# Patient Record
Sex: Female | Born: 1935 | Race: White | Hispanic: No | Marital: Married | Smoking: Never smoker
Health system: Southern US, Community
[De-identification: ages and names within clinical notes are randomized; demographics above are authoritative.]

## PROBLEM LIST (undated history)

## (undated) DIAGNOSIS — R112 Nausea with vomiting, unspecified: Secondary | ICD-10-CM

## (undated) DIAGNOSIS — I2510 Atherosclerotic heart disease of native coronary artery without angina pectoris: Secondary | ICD-10-CM

## (undated) DIAGNOSIS — G2581 Restless legs syndrome: Secondary | ICD-10-CM

## (undated) DIAGNOSIS — E1140 Type 2 diabetes mellitus with diabetic neuropathy, unspecified: Secondary | ICD-10-CM

## (undated) DIAGNOSIS — E039 Hypothyroidism, unspecified: Secondary | ICD-10-CM

## (undated) DIAGNOSIS — R29898 Other symptoms and signs involving the musculoskeletal system: Secondary | ICD-10-CM

## (undated) DIAGNOSIS — F329 Major depressive disorder, single episode, unspecified: Secondary | ICD-10-CM

## (undated) DIAGNOSIS — I10 Essential (primary) hypertension: Secondary | ICD-10-CM

## (undated) DIAGNOSIS — Z9889 Other specified postprocedural states: Secondary | ICD-10-CM

## (undated) DIAGNOSIS — M1990 Unspecified osteoarthritis, unspecified site: Secondary | ICD-10-CM

## (undated) DIAGNOSIS — Z862 Personal history of diseases of the blood and blood-forming organs and certain disorders involving the immune mechanism: Secondary | ICD-10-CM

## (undated) DIAGNOSIS — F32A Depression, unspecified: Secondary | ICD-10-CM

## (undated) DIAGNOSIS — R0602 Shortness of breath: Secondary | ICD-10-CM

## (undated) DIAGNOSIS — H9190 Unspecified hearing loss, unspecified ear: Secondary | ICD-10-CM

## (undated) DIAGNOSIS — E119 Type 2 diabetes mellitus without complications: Secondary | ICD-10-CM

## (undated) DIAGNOSIS — R32 Unspecified urinary incontinence: Secondary | ICD-10-CM

## (undated) HISTORY — PX: COLONOSCOPY: SHX174

## (undated) HISTORY — DX: Shortness of breath: R06.02

## (undated) HISTORY — PX: EYE SURGERY: SHX253

## (undated) HISTORY — PX: APPENDECTOMY: SHX54

## (undated) HISTORY — DX: Atherosclerotic heart disease of native coronary artery without angina pectoris: I25.10

## (undated) HISTORY — PX: CARDIAC CATHETERIZATION: SHX172

## (undated) HISTORY — PX: ABDOMINAL HYSTERECTOMY: SHX81

## (undated) HISTORY — DX: Type 2 diabetes mellitus without complications: E11.9

## (undated) HISTORY — PX: TUBAL LIGATION: SHX77

## (undated) HISTORY — PX: CORONARY ARTERY BYPASS GRAFT: SHX141

## (undated) HISTORY — DX: Essential (primary) hypertension: I10

## (undated) HISTORY — PX: SHOULDER SURGERY: SHX246

## (undated) HISTORY — PX: BACK SURGERY: SHX140

## (undated) HISTORY — PX: CHOLECYSTECTOMY: SHX55

---

## 1997-12-03 DIAGNOSIS — I2510 Atherosclerotic heart disease of native coronary artery without angina pectoris: Secondary | ICD-10-CM

## 1997-12-03 HISTORY — DX: Atherosclerotic heart disease of native coronary artery without angina pectoris: I25.10

## 1998-04-04 ENCOUNTER — Other Ambulatory Visit: Admission: RE | Admit: 1998-04-04 | Discharge: 1998-04-04 | Payer: Self-pay | Admitting: *Deleted

## 1998-04-27 ENCOUNTER — Ambulatory Visit (HOSPITAL_COMMUNITY): Admission: RE | Admit: 1998-04-27 | Discharge: 1998-04-27 | Payer: Self-pay | Admitting: Cardiology

## 1998-05-05 ENCOUNTER — Ambulatory Visit (HOSPITAL_COMMUNITY): Admission: RE | Admit: 1998-05-05 | Discharge: 1998-05-05 | Payer: Self-pay | Admitting: Cardiology

## 1998-05-09 ENCOUNTER — Inpatient Hospital Stay (HOSPITAL_COMMUNITY): Admission: AD | Admit: 1998-05-09 | Discharge: 1998-05-16 | Payer: Self-pay | Admitting: Cardiology

## 1998-05-10 ENCOUNTER — Encounter (HOSPITAL_COMMUNITY): Admission: RE | Admit: 1998-05-10 | Discharge: 1998-08-08 | Payer: Self-pay | Admitting: *Deleted

## 1998-08-09 ENCOUNTER — Encounter (HOSPITAL_COMMUNITY): Admission: RE | Admit: 1998-08-09 | Discharge: 1998-11-07 | Payer: Self-pay | Admitting: *Deleted

## 1999-01-25 ENCOUNTER — Encounter: Admission: RE | Admit: 1999-01-25 | Discharge: 1999-04-25 | Payer: Self-pay | Admitting: *Deleted

## 1999-05-19 ENCOUNTER — Other Ambulatory Visit: Admission: RE | Admit: 1999-05-19 | Discharge: 1999-05-19 | Payer: Self-pay | Admitting: *Deleted

## 2000-05-01 ENCOUNTER — Encounter: Payer: Self-pay | Admitting: *Deleted

## 2000-05-01 ENCOUNTER — Encounter: Admission: RE | Admit: 2000-05-01 | Discharge: 2000-05-01 | Payer: Self-pay | Admitting: *Deleted

## 2000-06-10 ENCOUNTER — Other Ambulatory Visit: Admission: RE | Admit: 2000-06-10 | Discharge: 2000-06-10 | Payer: Self-pay | Admitting: *Deleted

## 2000-10-28 ENCOUNTER — Inpatient Hospital Stay (HOSPITAL_COMMUNITY): Admission: AD | Admit: 2000-10-28 | Discharge: 2000-10-30 | Payer: Self-pay | Admitting: *Deleted

## 2000-10-29 ENCOUNTER — Encounter: Payer: Self-pay | Admitting: Pediatrics

## 2001-05-02 ENCOUNTER — Encounter: Payer: Self-pay | Admitting: *Deleted

## 2001-05-02 ENCOUNTER — Encounter: Admission: RE | Admit: 2001-05-02 | Discharge: 2001-05-02 | Payer: Self-pay | Admitting: *Deleted

## 2001-05-05 ENCOUNTER — Other Ambulatory Visit: Admission: RE | Admit: 2001-05-05 | Discharge: 2001-05-05 | Payer: Self-pay | Admitting: *Deleted

## 2002-04-20 ENCOUNTER — Encounter: Admission: RE | Admit: 2002-04-20 | Discharge: 2002-04-20 | Payer: Self-pay | Admitting: *Deleted

## 2002-04-20 ENCOUNTER — Encounter: Payer: Self-pay | Admitting: *Deleted

## 2002-05-12 ENCOUNTER — Other Ambulatory Visit: Admission: RE | Admit: 2002-05-12 | Discharge: 2002-05-12 | Payer: Self-pay | Admitting: *Deleted

## 2002-05-14 ENCOUNTER — Encounter: Admission: RE | Admit: 2002-05-14 | Discharge: 2002-05-14 | Payer: Self-pay | Admitting: *Deleted

## 2002-05-14 ENCOUNTER — Encounter: Payer: Self-pay | Admitting: *Deleted

## 2002-11-25 ENCOUNTER — Encounter: Admission: RE | Admit: 2002-11-25 | Discharge: 2002-11-25 | Payer: Self-pay

## 2003-08-16 ENCOUNTER — Encounter: Payer: Self-pay | Admitting: Cardiology

## 2003-08-16 ENCOUNTER — Encounter: Admission: RE | Admit: 2003-08-16 | Discharge: 2003-08-16 | Payer: Self-pay | Admitting: Cardiology

## 2003-08-20 ENCOUNTER — Ambulatory Visit (HOSPITAL_COMMUNITY): Admission: RE | Admit: 2003-08-20 | Discharge: 2003-08-21 | Payer: Self-pay | Admitting: Cardiology

## 2003-08-20 HISTORY — PX: OTHER SURGICAL HISTORY: SHX169

## 2003-11-12 ENCOUNTER — Encounter: Admission: RE | Admit: 2003-11-12 | Discharge: 2003-11-12 | Payer: Self-pay | Admitting: Family Medicine

## 2004-08-08 ENCOUNTER — Encounter: Admission: RE | Admit: 2004-08-08 | Discharge: 2004-08-08 | Payer: Self-pay | Admitting: Family Medicine

## 2004-11-30 ENCOUNTER — Encounter: Admission: RE | Admit: 2004-11-30 | Discharge: 2004-11-30 | Payer: Self-pay | Admitting: Family Medicine

## 2005-03-02 ENCOUNTER — Encounter: Admission: RE | Admit: 2005-03-02 | Discharge: 2005-03-02 | Payer: Self-pay | Admitting: Family Medicine

## 2005-03-06 ENCOUNTER — Encounter: Admission: RE | Admit: 2005-03-06 | Discharge: 2005-03-06 | Payer: Self-pay | Admitting: Family Medicine

## 2005-12-20 ENCOUNTER — Encounter: Admission: RE | Admit: 2005-12-20 | Discharge: 2005-12-20 | Payer: Self-pay | Admitting: Family Medicine

## 2006-02-10 ENCOUNTER — Emergency Department (HOSPITAL_COMMUNITY): Admission: EM | Admit: 2006-02-10 | Discharge: 2006-02-11 | Payer: Self-pay | Admitting: Emergency Medicine

## 2006-04-05 ENCOUNTER — Ambulatory Visit (HOSPITAL_COMMUNITY): Admission: RE | Admit: 2006-04-05 | Discharge: 2006-04-05 | Payer: Self-pay | Admitting: Cardiology

## 2006-09-05 ENCOUNTER — Encounter: Admission: RE | Admit: 2006-09-05 | Discharge: 2006-09-05 | Payer: Self-pay | Admitting: Family Medicine

## 2007-01-02 ENCOUNTER — Encounter: Admission: RE | Admit: 2007-01-02 | Discharge: 2007-01-02 | Payer: Self-pay | Admitting: Family Medicine

## 2007-01-29 ENCOUNTER — Encounter: Admission: RE | Admit: 2007-01-29 | Discharge: 2007-01-29 | Payer: Self-pay | Admitting: Family Medicine

## 2007-04-29 ENCOUNTER — Encounter (INDEPENDENT_AMBULATORY_CARE_PROVIDER_SITE_OTHER): Payer: Self-pay | Admitting: Neurosurgery

## 2007-04-29 ENCOUNTER — Inpatient Hospital Stay (HOSPITAL_COMMUNITY): Admission: RE | Admit: 2007-04-29 | Discharge: 2007-04-30 | Payer: Self-pay | Admitting: Neurosurgery

## 2007-07-16 ENCOUNTER — Encounter: Admission: RE | Admit: 2007-07-16 | Discharge: 2007-07-16 | Payer: Self-pay | Admitting: Family Medicine

## 2008-01-27 ENCOUNTER — Encounter: Admission: RE | Admit: 2008-01-27 | Discharge: 2008-01-27 | Payer: Self-pay | Admitting: Family Medicine

## 2009-01-27 ENCOUNTER — Encounter: Admission: RE | Admit: 2009-01-27 | Discharge: 2009-01-27 | Payer: Self-pay | Admitting: Family Medicine

## 2010-01-30 ENCOUNTER — Encounter: Admission: RE | Admit: 2010-01-30 | Discharge: 2010-01-30 | Payer: Self-pay | Admitting: Family Medicine

## 2011-01-03 ENCOUNTER — Other Ambulatory Visit: Payer: Self-pay | Admitting: Family Medicine

## 2011-01-03 DIAGNOSIS — Z780 Asymptomatic menopausal state: Secondary | ICD-10-CM

## 2011-01-03 DIAGNOSIS — Z1239 Encounter for other screening for malignant neoplasm of breast: Secondary | ICD-10-CM

## 2011-02-01 ENCOUNTER — Ambulatory Visit
Admission: RE | Admit: 2011-02-01 | Discharge: 2011-02-01 | Disposition: A | Discharge status: Home / Self Care | Payer: MEDICARE | Source: Ambulatory Visit | Attending: Family Medicine | Admitting: Family Medicine

## 2011-02-01 DIAGNOSIS — Z1239 Encounter for other screening for malignant neoplasm of breast: Secondary | ICD-10-CM

## 2011-02-01 DIAGNOSIS — Z780 Asymptomatic menopausal state: Secondary | ICD-10-CM

## 2011-04-17 NOTE — Op Note (Signed)
Sierra Wright, Sierra Wright              ACCOUNT NO.:  [id number]   MEDICAL RECORD NO.:  [id number]          PATIENT TYPE:  INP   LOCATION:  5125                         FACILITY:  [facility]   PHYSICIAN:  Clydene Fake, M.D.  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   PROCEDURE:  Right L4-5 laminectomy and removal of epidural mass,  microdissection with microscope.   SURGEON:  Clydene Fake, M.D.   ANESTHESIA:  General endotracheal tube anesthesia.   ESTIMATED BLOOD LOSS:  Minimal.   FLUIDS GIVEN:  None.   DRAINS:  None.   COMPLICATIONS:  None.   SPECIMENS:  Sent for permanent sections.   REASON FOR PROCEDURE:  The patient is a 77 year old woman who has been  having back, right hip and right leg pain. Lyrica did not help.  MRI was  done showing multiple spondylytic changes at L4-5 with a large right-  sided epidural mass adjacent with the facet joint consistent with a  synovial cyst.  The patient was brought in for decompression.   PROCEDURE IN DETAIL:  The patient was brought in to the operating room.  General anesthesia was induced.  Patient was placed in a prone position  on the Wilson frame with all pressure points padded.  Patient was  prepped and draped in sterile fashion.  The incision was injected with  10 mL of 1% lidocaine with epinephrine.  A needle was placed in the  interspace.  X-ray was obtained and it showed we were in the L4-5  interspace.   An incision was then made centered where the needle was.  Incision was  taken down to the fascia and hemostasis was obtained with bovie  cauterization.  The fascia was incised on the right side over the  L4-5  spinous process and subperiosteal dissection was performed over the  lamina to the facet.  A self-retaining retractor was placed.  A marker  was placed in the interspace at L4-5.  Another x-ray was obtained  confirming our position.  Microscope was brought in for microdissection  at this point and a high speed drill was used to start a semi-  hemilaminectomy and medial facetectomy.  This was completed with  Kerrison punches on the top of the L5 lamina also and foraminotomy over  the L5 root.  The ligamentum flavum was very thickened and this was  removed.  As we dissected out laterally toward the facet joint, there  was a mass that was again connected with the facet joint.  The softer  tissue of this was cut and sent to pathology for evaluation.  The rest  was removed.  As we peeled the ligament off, decompressing the L4 and L5  roots.  When we were finished, we had a good decompression of the  central canal and the  L4 and L5 roots.  Upward angled curets were used  to make sure no further cyst mass was on the foramen, especially over  the L4 root, and we checked both cephalad and caudally making sure there  was no further compression of the canal.  __________  solution gave good  hemostasis and we had good decompression.  The retractor was removed.  Fascia was closed with 0 Vicryl interrupted 2-0, 3-0, subcutaneous  tissue closed with 0, 2-0 and 3-0 Vicryl sutures.  Skin closed with  benzoin and Steri-Strips.  Dressing was placed.  Patient was placed back  into supine position, awoken from anesthesia and transferred to recovery  room in stable condition.           ______________________________  Clydene Fake, M.D.     JRH/MEDQ  D:  04/29/2007  T:  04/29/2007  Job:  [id number]

## 2011-04-20 NOTE — Cardiovascular Report (Signed)
Sierra Wright, Sierra Wright                        ACCOUNT NO.:  [id number]   MEDICAL RECORD NO.:  [id number]                   PATIENT TYPE:  OIB   LOCATION:  2852                                 FACILITY:  [facility]   PHYSICIAN:  Thereasa Solo. Little, M.D.              DATE OF BIRTH:  08-27-1934   DATE OF PROCEDURE:  08/20/2003  DATE OF DISCHARGE:                              CARDIAC CATHETERIZATION   PROCEDURE PERFORMED:  Left heart catheterization with graft visualization  times three.   CARDIOLOGIST:  Thereasa Solo. Little, M.D.   INDICATIONS FOR TEST:  Sierra Wright is a 77 year old female who had bypass  surgery in 1999.  She has been relatively asymptomatic.  She had a nuclear  study performed five years out from her surgery that showed significant  anterior ischemia.  Because of this she is brought in for outpatient cardiac  catheterization.   DESCRIPTION OF PROCEDURE:  The patient was prepped and draped in the usual  sterile fashion exposing the right groin.  Following local anesthetic with  1% Xylocaine the Seldinger technique was employed and a 6 [place] introducer  sheath was placed into the left femoral artery.  Left and right coronary  arteriography, and graft visualization times three were performed.  The  right coronary catheter was used to cannulate all grafts.   EQUIPMENT:  Size six [place] Judkins.   RESULTS:   HEMODYNAMIC DATA:  Hemodynamic Monitoring:  Central aortic pressure was  173/77.  Left ventricular pressure was 172/14.  There was no significant  aortic valve gradient noted at the time of pullback.   VENTRICULOGRAPHIC DATA:  Ventriculography:  Ventriculography in the RAO  projection revealed normal left ventricular systolic function.  Ejection  fraction was greater than 55% and the end-diastolic pressure was 18.   ARTERIOGRAPHIC DATA:  Coronary Arteriography:  On fluoroscopy calcification  was seen in the distribution of the LAD, circumflex and left main  bifurcation.   ANGIOGRAPHIC DATA:  1. Left Main:  Left main normal.   1. LAD:  The LAD had an area of proximal 90% narrowing.  It was a long     eccentric area of narrowing.  Distal to that was a septal perforator and     a diagonal that had a 75% area of narrowing in it.  Just past the     diagonal the LAD was 100% occluded.   1. Circumflex:  The circumflex was 100% occluded proximally.   1. Right Coronary Artery:  The mid and proximal portions of the RCA had     several areas of 60-70% narrowing.  The distal RCA, PDA and     posterolateral vessels were free of disease, and there was bidirectional     flow into the saphenous vein graft.  There was some visualization of what     appeared to be the LAD with collaterals from the right coronary artery.    GRAFT  VISUALIZATION:  1. Saphenous Vein Graft to the Right Coronary Artery:  The graft was widely     patent.  The distal right, the PDA and posterolateral branches were free     of disease.   1. Saphenous Vein Graft to the OM:  The saphenous vein graft was widely     patent.  The OM was widely patent.   1. LIMA to the LAD:  The subclavian artery was tortuous.  Injection of this,     however, showed no obstruction just tortuosity.  The internal mammary     artery was large and widely patent.  It inserted into the midportion of     the LAD and the mid and distal LAD were free of disease.  There was     retrograde flow all the way back to the diagonal.  There was proximal LAD     disease that is no approachable with percutaneous intervention; however,     the mid and distal portions well-supplied via the internal mammary     artery.   CONCLUSIONS:  1. Widely patent bypass grafts.  2. Normal left ventricular systolic function.   The only area that is not protected appears to be the first diagonal, which  has high-grade proximal LAD disease and moderate-to-severe disease in its  midportion.  This would be a high risk/complicated  intervention.  This does  not explain the substantial anteroseptal to apical ischemia noted on the  nuclear study.  I suspect the nuclear study was false positive.   We will treat her medically.                                                 Thereasa Solo. Little, M.D.    ABL/MEDQ  D:  08/20/2003  T:  08/21/2003  Job:  [id number]   cc:   Talmadge Coventry, M.D.  [address], [address]  [place]  [place] [address]  Fax: [phone]   Catheterization Laboratory

## 2011-04-20 NOTE — Consult Note (Signed)
[place]. [facility]  Patient:    Sierra Wright, Sierra Wright                     MRN: [id number] Proc. Date: 10/28/00 Adm. Date:  [id number] Attending:  Heather Roberts CC:         Heather Roberts, M.D.   Consultation Report  DATE OF BIRTH:  30-Apr-1934.  CHIEF COMPLAINT:  Dizziness.  HISTORY OF PRESENT ILLNESS:  I was asked to see Ms. Sierra Wright, a 77 year old right-handed married woman who had sudden onset of unsteadiness and subjective vertigo while getting out of bed to void around midnight to void in the early morning of October 28, 2000.  The patient remembers falling into the wall and was not certain how that happened.  She had an episode of emesis.  She has had several loose stools and episodes of nausea and vomiting, sometimes together.  Typically, these have been associated with feeling of vertigo.  The patient denies fever, myalgias, arthralgias, coryza, abdominal or epigastric cramping or abdominal pain.  The patient has complained of some numbness in her hands and feet but she has been hyperventilating.  It has helped to manage her symptoms by lying very still and at times pushing her hand into her forehead and closing her eyes.  She has a great deal of difficulty describing the vertigo.  She says there is a fullness in her head and it feels as if there is a pounding or as if her head is enlarged.  On some occasions, however, the patient will describe the counterclockwise feeling of spinning within her head, although she does not see the world spin.  The patient was noted to have nystagmus by Dr. Orson Aloe, however, I have been unable to see any.  (See below).  REVIEW OF SYSTEMS:  See above.  Up until this time the patient had good appetite, no weight loss, normal sleep patterns, until she had the vomiting and diarrhea.  She had no symptoms of intercurrent infection in the head and neck, lungs or GU.  The patient has not had any rash.  She  has no anemia or bruisability.  No arthritis, no fractures or deformities.  Review of systems is otherwise negative.  Her current medical problems include non-insulin-dependent diabetes mellitus, hypothyroidism, hyperlipidemia hypertension, coronary artery disease.  She is postmenopausal.  PAST MEDICAL HISTORY:  Diabetes was diagnosed about two years ago.  The patient had chest tightness and coronary artery bypass graft x 3 in June 1999.  PAST SURGICAL HISTORY:  Coronary artery bypass grafting as noted above, also hysterectomy and cholecystectomy.  FAMILY HISTORY:  The patients father died in his 30s of myocardial infarction.  Her mother died in her 31s following a fractured hip and likely pulmonary embolus.  The patient had a brother die in his 23s.  There is one living sister 77, who is well.  I think another sister died of cancer.  SOCIAL HISTORY:  The patient lives with her husband and two grandchildren. She does not use tobacco or alcohol products.  CURRENT MEDICATIONS:  Synthroid 0.125 mg, Norvasc 10 mg, Micardia 80 mg, vitamin E, vitamin B6 and calcium.  ALLERGIES:  No known drug allergies.  PHYSICAL EXAMINATION:  GENERAL:  A pleasant woman, lying in bed still, in no distress.  VITAL SIGNS:  Blood pressure 190/80, resting pulse 76, respirations 20, temperature 96.9, pulse oximetry is 100% on room air.  EAR, NOSE AND THROAT:  The patient has  some popping in her temporomandibular joints, left greater than right, no pain.  NECK:  Supple, no bruits.  LUNGS:  Clear.  HEART:  No murmurs.  Pulses normal.  ABDOMEN:  Soft, bowel sounds normal.  No hepatosplenomegaly.  Nontender.  EXTREMITIES:  Well-formed without edema, cyanosis, alteration in tone or tight heel cords.  NEUROLOGIC EXAMINATION:  Awake, alert, attentive, no dysphasia or dyspraxia.  CRANIAL NERVE EXAMINATION:  Round reactive pupils, fundi normal.  Visual fields full to double simultaneous stimuli, okay  in responses and equal bilaterally.  Symmetric facial strength.  Midline tongue and uvula.  Air conduction greater than bone conduction.  No nystagmus, no tinnitus.  Negative Barany.  The patient complains of "vertigo" which is counterclockwise feeling of motion in her head when she sits up or tilts her head forward.  MOTOR EXAMINATION:  Normal strength, no drift.  Fine motor movements are normal.  SENSORY EXAM:  Sensation is intact to primary and cortical modalities.  CEREBELLAR EXAM:  Good finger-to-nose, rapid repetitive movements.  Gait and station was broad-based.  She cannot tandem.  She has a shaky Romberg.  She did well on her heels and toes when she was supported.  DEEP TENDON REFLEXES:  Diminished at the ankles and knees, normal elsewhere. She had bilateral flexor plantar responses.  IMPRESSION:  I suspect this is a central form of a vertigo.  Though it is positional, I see no evidence of nystagmus.  In addition, the patient has evidence of gait ataxia.  I am most concerned about a cerebellar outflow distribution stroke, plus the patient has multiple risk factors for stroke.  RECOMMENDATIONS: 1. Will screen for a stroke with MRI with and without contrast and MRA    intracranial.  I doubt this is a lateral medullary syndrome unless is just    involves cerebellar outflow. 2. Will decrease vertiginous symptoms with Valium 5 mg four times a day IV or    p.o. 3. Will decrease nausea with gradual increase in medications from Phenergan    12.5 mg to 25 mg every 4 hours to Zofran 4 mg every 8 hours to Droperidol 1    mg every 4 hours. 4. The patient can go home when she is more steady on her feet.  I would    recommend a physical therapist to assess her if she remains unsteady.  I appreciate the opportunity to see her.  If you have questions or if I can be of assistance, do not hesitate to contact me. DD:  10/28/00 TD:  10/29/00 Job: [id number]

## 2011-04-20 NOTE — H&P (Signed)
[place]. [facility]  Patient:    Sierra Wright, Sierra Wright                     MRN: [id number] Adm. Date:  [id number] Attending:  Heather Roberts CC:         Deanna Artis. Sharene Skeans, M.D.  Thereasa Solo. Little, M.D.   History and Physical  CHIEF COMPLAINT:  Vomiting and vertigo.  HISTORY OF PRESENT ILLNESS:  Ms. Doane is a 77 year old white married female who felt well on the day prior to admission.  She got up at midnight to void which is normal for her.  While walking to the bathroom she had the abrupt onset of swimmiheadedness with nausea.  She did go to the bathroom and return to bed, but got up at 4 a.m. and 7 a.m. to have urgent loose stools associated with vertigo and nausea with some vomiting.  She has had persistent nausea for the day whenever she moves her head.  She denies chest pain.  She is breathing quickly, but denies shortness of breath.  She has had some numbness in her hands and feet.  Denies previous episodes of dizziness.  SOCIAL HISTORY:  Lives with her husband, two grown children in the area.  MEDICATIONS: 1. Micardis 80. 2. Synthroid 0.125. 3. Norvasc 10. 4. Vitamin E. 5. 6 of calcium.  PAST MEDICAL HISTORY:  1947 appendectomy, 1972 BTL, 1984 colposcopy for abnormal Pap, 1986 D&C fractional followed by 1990 vaginal hysterectomy with A&P repair by Dr. Randell Patient in [facility].  In 1993 laparoscopic cholecystectomy by Dr. Rozetta Nunnery.  1997 sigmoidoscopy by Dr. Madilyn Fireman. November of 1998 shoulder.  June 1999 CABG x 3.  Dr. Clarene Duke is cardiologist.  FAMILY HISTORY:  Father died of MI at age 25, had hypertension, bladder cancer, PUD.  Mother had hypertension and died from complications of THR. Sisters have history of CAD, hypertension, brother takes insulin for diabetes diagnosed as adult.  REVIEW OF SYSTEMS:  HEENT: Mild congestion, not recently.  HEART: History of CAD, angina, and actually presented with profound fatigue.  She  is currently denying chest pain.  LUNGS: No wheezing and shortness of breath. GASTROINTESTINAL: She is having some loose stools and some mild epigastric discomfort.  GYN: She is menopausal, not currently symptomatic.  Has deferred HRT in the past.  PHYSICAL EXAMINATION:  GENERAL:  A white female in mild distress who is lying on the bed with her eyes closed.  VITAL SIGNS:  Temperature 98, blood pressure 138/66.  The patient is unable to stand.  HEENT:  Fundi are poorly seen.  Pharynx noninjected.  TMs clear.  Carotids 2+, no bruits, and no thyromegaly.  HEART:  Normal S1 and S2.  LUNGS:  Clear to A&P.  ABDOMEN:  Soft.  She is tender in the left upper quadrant and epigastrium.  EXTREMITIES:  Free of edema.  LABORATORY DATA:  Hemoglobin 17, glucose 179.  IMPRESSION: 1. Labyrinthitis, rule out CVA cardiac event. 2. Epigastric pain. 3. Coronary artery disease. 4. Hypertension. 5. Hyperlipidemia. 6. Hyperglycemia. 7. Hypothyroidism. 8. Menopause.  PLAN:  Admission for better evaluation, IV fluids, Phenergan, neurologic consult. DD:  10/28/00 TD:  10/28/00 Job: [id number]

## 2011-04-20 NOTE — Consult Note (Signed)
[place]. [facility]  Patient:    Sierra Wright, Sierra Wright                     MRN: [id number] Proc. Date: 10/29/00 Adm. Date:  [id number] Attending:  Heather Roberts CC:         Heather Roberts, M.D.   Consultation Report  HISTORY OF PRESENT ILLNESS:  Patient is a 77 year old female we are seeing in consultation for Dr. Heather Roberts because of chest pain.  She was admitted on October 28, 2000 with dizziness rule out CVA.  While having an MRI done today, she developed pressure in her chest, this resolved immediately after the MRI, and she is now pain-free.  Her past history includes bypass surgery in 1999 including internal mammary artery to the LAD, saphenous vein graft to the RCA, saphenous vein graft to the circumflex.  She was last seen September 2001 in my office and was doing quite well and exercising regularly without any limitations.  Up until yesterday when the dizziness began she had been very active with no limitations.  She walks on a treadmill at 2.8 mph and she rides a stationary bicycle without any exertional induced pain or pressure or unusual shortness of breath.  Her cardiac enzymes revealed a CK of 158 with 1.3 MB.  A second CK was 125 but the MB fraction was increased at 4.1 (I doubt any clinical significance).  She was mildly hypokalemic with a 2.9 potassium at the time of admission, this is being replaced, she had been on hydrochlorothiazide.  Her EKG shows a chronic right bundle branch block however the EKG today and the EKG at admission had T wave inversion in 3 and F which was more than the cardiogram September 2001 in my office.  PAST MEDICAL HISTORY:  History of hyperlipidemia, hypothyroidism, diabetes, hypertension, and coronary artery disease.  OUTPATIENT MEDICATIONS:  1. Norvasc 10.  2. ______ 80 q.d.  3. Hydrochlorothiazide 25 mg a day.  4. KCl 20 mEq a day.  5. Calcium.  6. Multivitamins.  7. Vitamin E.  8.  Synthroid 0.125.  9. Amaryl 2 mg a day. 10. Valium 5 mg PRE p.r.n.  ALLERGIES:  No known drug allergies.  FAMILY HISTORY:  Positive for coronary artery disease and hypertension.  SOCIAL HISTORY:  She is married with two adult children.  No alcohol.  No cigarettes.  PHYSICAL EXAMINATION:  VITAL SIGNS:  Blood pressure is 148/84.  Her pulse is in the 70s and is regular.  She is afebrile.  LUNGS:  Clear.  CARDIAC:  Regular rate and rhythm, 1/6 faint systolic murmur, abnormal second heart sound secondary to bundle branch block, no chest wall or epigastric tenderness.  She has 2+ pulses in her lower extremities, no edema.  She has no carotid, no JVD, thyroid was nonpalpable.  SKIN:  Warm and dry.  ASSESSMENT: 1. Atypical chest pain - doubt this is angina.  We will check troponin and    repeat EKG and CK and troponin in the morning.  For now, we will not change    her current medical therapy. 2. Dizziness - probably vertigo with neurology evaluation underway. 3. Hypertension - under good control. 4. Diabetes mellitus - per Dr. Orson Aloe. DD:  10/29/00 TD:  10/29/00 Job: [id number]

## 2011-04-20 NOTE — Discharge Summary (Signed)
[place]. [facility]  Patient:    TUCKER, STEEDLEY                     MRN: [id number] Adm. Date:  [id number] Disc. Date: 10/30/00 Attending:  Heather Roberts CC:         Thereasa Solo. Little, M.D.  Deanna Artis. Sharene Skeans, M.D.   Discharge Summary  CONSULTATIONS:  Deanna Artis. Sharene Skeans, M.D. and Thereasa Solo. Little, M.D.  DISCHARGE DIAGNOSES: 1. Labyrinthitis. 2. Atypical chest pain. 3. Known coronary artery disease, status post coronary artery bypass graft. 4. Hypertension. 5. Hypothyroidism. 6. Hyperlipidemia. 7. Hypokalemia.  HISTORY OF PRESENT ILLNESS:  Ms. Wiederhold is a 77 year old white married female, who was admitted after 12 hours of dizziness associated with vomiting with all movement of head.  At the time of admission, it was unclear whether this was a central or peripheral problem.  LABORATORY DATA:  CBC normal.  Chemistries remarkable for a potassium of 2.9 on the day after admission.  CPK with MB and troponins negative.  MRI and MRA done on the morning following admission showed mild changes of small vessel disease within the deep and subcortical white matter of the cerebral hemispheres, particularly the parietal lobes.  Acute reversible process, none noted.  Angiogram showed medium vessel atherosclerotic change within the intracranial vessels more pronounced in the posterior cerebral arteries and in the anterior and middle cerebral arteries.  ECG showed normal sinus rhythm with a right bundle-branch block unchanged.  HOSPITAL COURSE:  Ms. Scrima was admitted with the above symptoms and was seen on evening of admission by Dr. Sharene Skeans.  It was unclear by her examination whether she had a central peripheral problem and MRI was ordered. On the morning following admission she felt somewhat better but still had nausea and dizziness with movement of her head.  She progressed through that day to being able to tolerate a regular diet.  After  returning for her MRI the morning following admission, she developed some substernal chest discomfort and Dr. Clarene Duke was asked to see her in consultation.  ECGs and cardiac enzymes were obtained and the feeling was that she had atypical chest pain and would follow up with Dr. Clarene Duke as an outpatient for further evaluation if needed.  DISCHARGE CONDITION:  Satisfactory.  DISCHARGE MEDICATIONS: 1. Antivert 25 t.i.d. p.r.n. dizziness. 2. Phenergan 25 p.r. or p.o. q.4-6h. p.r.n. nausea, vomiting. 3. Norvasc 10. 4. Micardis 80. 5. Slow-K b.i.d. 6. HCTZ 25. 7. Synthroid 0.125. 8. Diazepam 10 p.r.n. which is usually three times a month. DD:  10/30/00 TD:  10/30/00 Job: [id number]

## 2011-12-27 ENCOUNTER — Other Ambulatory Visit: Payer: Self-pay | Admitting: Family Medicine

## 2011-12-27 DIAGNOSIS — Z1231 Encounter for screening mammogram for malignant neoplasm of breast: Secondary | ICD-10-CM

## 2012-02-04 ENCOUNTER — Ambulatory Visit
Admission: RE | Admit: 2012-02-04 | Discharge: 2012-02-04 | Disposition: A | Discharge status: Home / Self Care | Payer: Self-pay | Source: Ambulatory Visit | Attending: Family Medicine | Admitting: Family Medicine

## 2012-02-04 DIAGNOSIS — Z1231 Encounter for screening mammogram for malignant neoplasm of breast: Secondary | ICD-10-CM

## 2012-09-03 DIAGNOSIS — R0602 Shortness of breath: Secondary | ICD-10-CM

## 2012-09-03 HISTORY — DX: Shortness of breath: R06.02

## 2013-01-08 ENCOUNTER — Other Ambulatory Visit: Payer: Self-pay | Admitting: Family Medicine

## 2013-01-08 DIAGNOSIS — Z1231 Encounter for screening mammogram for malignant neoplasm of breast: Secondary | ICD-10-CM

## 2013-02-04 ENCOUNTER — Ambulatory Visit: Payer: Medicare Other

## 2013-03-04 ENCOUNTER — Ambulatory Visit
Admission: RE | Admit: 2013-03-04 | Discharge: 2013-03-04 | Disposition: A | Discharge status: Home / Self Care | Payer: Medicare Other | Source: Ambulatory Visit | Attending: Family Medicine | Admitting: Family Medicine

## 2013-03-04 DIAGNOSIS — Z1231 Encounter for screening mammogram for malignant neoplasm of breast: Secondary | ICD-10-CM

## 2013-03-06 ENCOUNTER — Encounter: Payer: Self-pay | Admitting: Physician Assistant

## 2013-03-07 ENCOUNTER — Encounter: Payer: Self-pay | Admitting: *Deleted

## 2013-03-11 ENCOUNTER — Encounter: Payer: Self-pay | Admitting: Internal Medicine

## 2013-04-06 ENCOUNTER — Other Ambulatory Visit (HOSPITAL_COMMUNITY): Payer: Self-pay | Admitting: Internal Medicine

## 2013-04-06 DIAGNOSIS — M25569 Pain in unspecified knee: Secondary | ICD-10-CM

## 2013-04-06 DIAGNOSIS — R600 Localized edema: Secondary | ICD-10-CM

## 2013-04-17 ENCOUNTER — Ambulatory Visit (HOSPITAL_COMMUNITY)
Admission: RE | Admit: 2013-04-17 | Discharge: 2013-04-17 | Disposition: A | Discharge status: Home / Self Care | Payer: Medicare Other | Source: Ambulatory Visit | Attending: Cardiovascular Disease | Admitting: Cardiovascular Disease

## 2013-04-17 DIAGNOSIS — R609 Edema, unspecified: Secondary | ICD-10-CM | POA: Insufficient documentation

## 2013-04-17 DIAGNOSIS — M79609 Pain in unspecified limb: Secondary | ICD-10-CM

## 2013-04-17 DIAGNOSIS — M7989 Other specified soft tissue disorders: Secondary | ICD-10-CM

## 2013-04-17 DIAGNOSIS — R600 Localized edema: Secondary | ICD-10-CM

## 2013-04-17 DIAGNOSIS — M25569 Pain in unspecified knee: Secondary | ICD-10-CM | POA: Insufficient documentation

## 2013-04-17 NOTE — Progress Notes (Signed)
Venous Duplex Lower Ext. Completed. Telicia Hodgkiss D  

## 2013-04-17 NOTE — Progress Notes (Signed)
Lower extremity arterial complete. GMG 

## 2013-04-20 ENCOUNTER — Other Ambulatory Visit: Payer: Self-pay | Admitting: *Deleted

## 2013-04-20 MED ORDER — BENAZEPRIL HCL 40 MG PO TABS: 40.0000 mg | ORAL_TABLET | Freq: Every day | ORAL | Status: DC

## 2013-04-23 ENCOUNTER — Other Ambulatory Visit: Payer: Self-pay

## 2013-04-23 MED ORDER — POTASSIUM CHLORIDE CRYS ER 10 MEQ PO TBCR: 10.0000 meq | EXTENDED_RELEASE_TABLET | Freq: Every day | ORAL | Status: DC

## 2013-04-23 NOTE — Telephone Encounter (Signed)
Rx was sent to pharmacy electronically. 

## 2013-04-26 ENCOUNTER — Other Ambulatory Visit (HOSPITAL_COMMUNITY): Payer: Self-pay | Admitting: Internal Medicine

## 2013-05-19 ENCOUNTER — Other Ambulatory Visit (HOSPITAL_COMMUNITY): Payer: Self-pay | Admitting: Internal Medicine

## 2013-08-07 ENCOUNTER — Encounter (INDEPENDENT_AMBULATORY_CARE_PROVIDER_SITE_OTHER): Payer: Self-pay | Admitting: Ophthalmology

## 2013-08-27 ENCOUNTER — Other Ambulatory Visit: Payer: Self-pay | Admitting: Neurosurgery

## 2013-08-27 DIAGNOSIS — M545 Low back pain: Secondary | ICD-10-CM

## 2013-09-10 ENCOUNTER — Ambulatory Visit
Admission: RE | Admit: 2013-09-10 | Discharge: 2013-09-10 | Disposition: A | Discharge status: Home / Self Care | Payer: Medicare Other | Source: Ambulatory Visit | Attending: Neurosurgery | Admitting: Neurosurgery

## 2013-09-10 DIAGNOSIS — M545 Low back pain: Secondary | ICD-10-CM

## 2013-09-10 MED ORDER — GADOBENATE DIMEGLUMINE 529 MG/ML IV SOLN
20.0000 mL | Freq: Once | INTRAVENOUS | Status: AC | PRN
  Administered 2013-09-10: 20 mL via INTRAVENOUS

## 2013-09-17 ENCOUNTER — Other Ambulatory Visit (HOSPITAL_COMMUNITY): Payer: Self-pay | Admitting: Internal Medicine

## 2013-09-17 NOTE — Telephone Encounter (Signed)
Rx was sent to pharmacy electronically. 

## 2013-09-28 ENCOUNTER — Other Ambulatory Visit: Payer: Self-pay | Admitting: *Deleted

## 2013-09-28 MED ORDER — AMLODIPINE BESYLATE 10 MG PO TABS: 10.0000 mg | ORAL_TABLET | Freq: Every day | ORAL | Status: DC

## 2013-09-28 NOTE — Telephone Encounter (Signed)
Rx was sent to pharmacy electronically. 

## 2013-10-01 ENCOUNTER — Other Ambulatory Visit: Payer: Self-pay | Admitting: Neurosurgery

## 2013-10-01 DIAGNOSIS — G959 Disease of spinal cord, unspecified: Secondary | ICD-10-CM

## 2013-10-13 ENCOUNTER — Other Ambulatory Visit: Payer: Medicare Other

## 2013-10-14 ENCOUNTER — Ambulatory Visit
Admission: RE | Admit: 2013-10-14 | Discharge: 2013-10-14 | Disposition: A | Discharge status: Home / Self Care | Payer: Medicare Other | Source: Ambulatory Visit | Attending: Neurosurgery | Admitting: Neurosurgery

## 2013-10-14 ENCOUNTER — Other Ambulatory Visit: Payer: Self-pay | Admitting: Neurosurgery

## 2013-10-14 DIAGNOSIS — G959 Disease of spinal cord, unspecified: Secondary | ICD-10-CM

## 2013-11-15 ENCOUNTER — Other Ambulatory Visit: Payer: Self-pay | Admitting: Internal Medicine

## 2013-11-16 NOTE — Telephone Encounter (Signed)
Rx was sent to pharmacy electronically. 

## 2013-11-20 ENCOUNTER — Other Ambulatory Visit: Payer: Self-pay | Admitting: *Deleted

## 2013-11-20 MED ORDER — METOPROLOL TARTRATE 50 MG PO TABS: ORAL_TABLET | ORAL | Status: DC

## 2013-11-20 NOTE — Telephone Encounter (Signed)
Rx was sent to pharmacy electronically. 

## 2013-12-23 ENCOUNTER — Other Ambulatory Visit: Payer: Self-pay | Admitting: *Deleted

## 2013-12-23 MED ORDER — HYDRALAZINE HCL 25 MG PO TABS: 25.0000 mg | ORAL_TABLET | Freq: Two times a day (BID) | ORAL | Status: DC

## 2014-02-03 ENCOUNTER — Other Ambulatory Visit: Payer: Self-pay

## 2014-02-03 DIAGNOSIS — Z1231 Encounter for screening mammogram for malignant neoplasm of breast: Secondary | ICD-10-CM

## 2014-03-05 ENCOUNTER — Ambulatory Visit
Admission: RE | Admit: 2014-03-05 | Discharge: 2014-03-05 | Disposition: A | Discharge status: Home / Self Care | Payer: Medicare Other | Source: Ambulatory Visit

## 2014-03-05 DIAGNOSIS — Z1231 Encounter for screening mammogram for malignant neoplasm of breast: Secondary | ICD-10-CM

## 2014-03-15 ENCOUNTER — Encounter (INDEPENDENT_AMBULATORY_CARE_PROVIDER_SITE_OTHER): Payer: Medicare Other | Admitting: Ophthalmology

## 2014-03-15 ENCOUNTER — Encounter: Payer: Self-pay | Admitting: Cardiology

## 2014-03-15 ENCOUNTER — Ambulatory Visit (INDEPENDENT_AMBULATORY_CARE_PROVIDER_SITE_OTHER): Payer: Medicare Other | Admitting: Cardiology

## 2014-03-15 VITALS — BP 154/72 | HR 52 | Ht 65.0 in | Wt 212.0 lb

## 2014-03-15 DIAGNOSIS — R609 Edema, unspecified: Secondary | ICD-10-CM | POA: Insufficient documentation

## 2014-03-15 DIAGNOSIS — I440 Atrioventricular block, first degree: Secondary | ICD-10-CM | POA: Insufficient documentation

## 2014-03-15 DIAGNOSIS — E1139 Type 2 diabetes mellitus with other diabetic ophthalmic complication: Secondary | ICD-10-CM

## 2014-03-15 DIAGNOSIS — R001 Bradycardia, unspecified: Secondary | ICD-10-CM | POA: Insufficient documentation

## 2014-03-15 DIAGNOSIS — Z951 Presence of aortocoronary bypass graft: Secondary | ICD-10-CM

## 2014-03-15 DIAGNOSIS — E119 Type 2 diabetes mellitus without complications: Secondary | ICD-10-CM | POA: Insufficient documentation

## 2014-03-15 DIAGNOSIS — H35039 Hypertensive retinopathy, unspecified eye: Secondary | ICD-10-CM

## 2014-03-15 DIAGNOSIS — H43819 Vitreous degeneration, unspecified eye: Secondary | ICD-10-CM

## 2014-03-15 DIAGNOSIS — E11319 Type 2 diabetes mellitus with unspecified diabetic retinopathy without macular edema: Secondary | ICD-10-CM

## 2014-03-15 DIAGNOSIS — I498 Other specified cardiac arrhythmias: Secondary | ICD-10-CM

## 2014-03-15 DIAGNOSIS — E1165 Type 2 diabetes mellitus with hyperglycemia: Secondary | ICD-10-CM

## 2014-03-15 DIAGNOSIS — I10 Essential (primary) hypertension: Secondary | ICD-10-CM

## 2014-03-15 DIAGNOSIS — I4510 Unspecified right bundle-branch block: Secondary | ICD-10-CM

## 2014-03-15 DIAGNOSIS — M1990 Unspecified osteoarthritis, unspecified site: Secondary | ICD-10-CM

## 2014-03-15 DIAGNOSIS — I119 Hypertensive heart disease without heart failure: Secondary | ICD-10-CM

## 2014-03-15 DIAGNOSIS — IMO0001 Reserved for inherently not codable concepts without codable children: Secondary | ICD-10-CM

## 2014-03-15 DIAGNOSIS — R42 Dizziness and giddiness: Secondary | ICD-10-CM

## 2014-03-15 MED ORDER — AMLODIPINE BESYLATE 10 MG PO TABS: 10.0000 mg | ORAL_TABLET | Freq: Every day | ORAL | Status: DC

## 2014-03-15 MED ORDER — HYDRALAZINE HCL 25 MG PO TABS: 25.0000 mg | ORAL_TABLET | Freq: Three times a day (TID) | ORAL | Status: DC

## 2014-03-15 MED ORDER — METOPROLOL TARTRATE 25 MG PO TABS: ORAL_TABLET | ORAL | Status: DC

## 2014-03-15 MED ORDER — FUROSEMIDE 40 MG PO TABS: ORAL_TABLET | ORAL | Status: DC

## 2014-03-15 MED ORDER — BENAZEPRIL HCL 40 MG PO TABS: ORAL_TABLET | ORAL | Status: DC

## 2014-03-15 MED ORDER — POTASSIUM CHLORIDE CRYS ER 10 MEQ PO TBCR
10.0000 meq | EXTENDED_RELEASE_TABLET | Freq: Every day | ORAL | Status: DC
Start: 2014-03-15 — End: 2014-07-26

## 2014-03-15 NOTE — Patient Instructions (Signed)
START ASPIRIN 81 MG DAILY  DECREASE YOUR MOBIC TO 7.5 MG ONCE A DAY  INCREASE YOUR HYDRALAZINE TO 25 MG THREE TIMES A DAY  DECREASE YOUR METOPROLOL TO 25 MG TWICE A DAY  Your physician has requested that you have an echocardiogram. Echocardiography is a painless test that uses sound waves to create images of your heart. It provides your doctor with information about the size and shape of your heart and how well your heart's chambers and valves are working. This procedure takes approximately one hour. There are no restrictions for this procedure.  Your physician wants you to follow-up in: 4 MONTH OV/EKG  You will receive a reminder letter in the mail two months in advance. If you don't receive a letter, please call our office to schedule the follow-up appointment.

## 2014-03-15 NOTE — Progress Notes (Signed)
Sierra MajorSarah L Wright Date of Birth:  12/25/1933 [address] [place], [place]  [id number] [phone]         Fax   [phone]  History of Present Illness: This pleasant 80 year old woman is seen by me for the first time today at the request of her PCP Dr. Mila PalmerSharon Wolters.  The patient has a history of known ischemic heart disease.  She had successful CABG 16 years ago by Dr. Tyrone SageGerhardt.  She has not been experiencing any subsequent chest pain or angina.  She notes occasional shortness of breath.  She had an echocardiogram in October 2013 which showed normal left ventricular systolic function with ejection fraction greater than 55% and she did have diastolic dysfunction.  She has a history of hypertension and she has a past history of mild fluid retention responding to Lasix.  She has intermittent ankle edema.  Her overall weight has been stable.  She has occasional mild dizzy spells but no syncope.  She has not been experiencing any palpitations or tachycardia.  She is relatively sedentary because of significant osteoarthritis of her knees.  She arrived in our office today by wheelchair.  She sleeps with one pillow under her head and also a pillow under her feet to help with her mild dependent edema.  During the day she wears compression hose.  She does not have any history of TIA or stroke.  She does not have any history of peptic ulcer disease or GI bleeding.  She has a history of intolerance to cholesterol lowering drugs including statins, ezetimibe, and Welchol.  Family history is positive for ischemic heart disease.  Her father died at 8064 and a brother died at 7555 of heart attacks Current Outpatient Prescriptions  Medication Sig Dispense Refill  . amLODipine (NORVASC) 10 MG tablet Take 1 tablet (10 mg total) by mouth daily.  30 tablet  5  . Ascorbic Acid (VITAMIN C ER PO) Take by mouth daily.      Marland Kitchen. aspirin 81 MG tablet Take 81 mg by mouth daily.      . benazepril (LOTENSIN) 40 MG  tablet TAKE ONE TABLET BY MOUTH ONCE DAILY  30 tablet  5  . Calcium Carb-Cholecalciferol (CALCIUM 1000 + D PO) Take by mouth daily.      . diazepam (VALIUM) 10 MG tablet Take 10 mg by mouth every 6 (six) hours as needed for anxiety.      . fish oil-omega-3 fatty acids 1000 MG capsule Take 1 g by mouth daily.      . furosemide (LASIX) 40 MG tablet TAKE ONE TABLET BY MOUTH TWICE DAILY  60 tablet  5  . Garlic 1000 MG CAPS Take by mouth daily.      Marland Kitchen. glimepiride (AMARYL) 4 MG tablet Take 4 mg by mouth 2 (two) times daily.      . hydrALAZINE (APRESOLINE) 25 MG tablet Take 1 tablet (25 mg total) by mouth 3 (three) times daily.  90 tablet  5  . levothyroxine (SYNTHROID, LEVOTHROID) 100 MCG tablet Take 100 mcg by mouth daily before breakfast.      . meloxicam (MOBIC) 7.5 MG tablet Take 7.5 mg by mouth daily.      . metoprolol (LOPRESSOR) 25 MG tablet TAKE ONE TABLET BY MOUTH TWICE DAILY  60 tablet  5  . potassium chloride (K-DUR,KLOR-CON) 10 MEQ tablet Take 1 tablet (10 mEq total) by mouth daily.  30 tablet  5  . rOPINIRole (REQUIP) 0.5  MG tablet Take 0.5 mg by mouth daily.      . vitamin B-12 (CYANOCOBALAMIN) 1000 MCG tablet Take 1,000 mcg by mouth daily.       No current facility-administered medications for this visit.    Allergies  Allergen Reactions  . Statins Other (See Comments)    Causes myalgias  . Welchol [Colesevelam Hcl] Other (See Comments)    Causes dizziness    Patient Active Problem List   Diagnosis Date Noted  . Hx of CABG 03/15/2014  . Osteoarthritis 03/15/2014  . Type II or unspecified type diabetes mellitus without mention of complication, uncontrolled 03/15/2014  . Peripheral edema 03/15/2014  . Benign hypertensive heart disease without heart failure 03/15/2014  . Right bundle branch block 03/15/2014  . First degree AV block 03/15/2014  . Sinus bradycardia by electrocardiogram 03/15/2014  . Dizziness 03/15/2014    History  Smoking status  . Never Smoker     Smokeless tobacco  . Not on file    History  Alcohol Use: Not on file    Family history: Father died of a heart attack at age 56. Mother died of pneumonia following hip fracture at age 61. She has a brother who died of a heart attack at age 45.  Review of Systems: Constitutional: no fever chills diaphoresis or fatigue or change in weight.  Head and neck: no hearing loss, no epistaxis, no photophobia or visual disturbance. Respiratory: Positive for mild dyspnea intermittently Cardiovascular: No chest pain  palpitations.  Positive for intermittent pedal edema Gastrointestinal: No abdominal distention, no abdominal pain, no change in bowel habits hematochezia or melena. Genitourinary: No dysuria, no frequency, no urgency, no nocturia. Musculoskeletal:No arthralgias, no back pain, no gait disturbance or myalgias.  Positive for incapacitating osteoarthritis of her knees Neurological: No dizziness, no headaches, no numbness, no seizures, no syncope, no weakness, no tremors. Hematologic: No lymphadenopathy, no easy bruising. Psychiatric: No confusion, no hallucinations, no sleep disturbance.    Physical Exam: Filed Vitals:   03/15/14 1048  BP: 154/72  Pulse: 52   the general appearance reveals a well-developed elderly woman in no acute distress.  She arrives by wheelchair.  Her husband came with her.The head and neck exam reveals pupils equal and reactive.  Extraocular movements are full.  There is no scleral icterus.  The mouth and pharynx are normal.  The neck is supple.  The carotids reveal no bruits.  The jugular venous pressure is normal.  The  thyroid is not enlarged.  There is no lymphadenopathy.  The chest is clear to percussion and auscultation.  There are no rales or rhonchi.  Expansion of the chest is symmetrical.  The precordium is quiet.  The first heart sound is normal.  The second heart sound is physiologically split.  There is no murmur gallop rub or click.  There is no  abnormal lift or heave.  The abdomen is soft and nontender.  The bowel sounds are normal.  The liver and spleen are not enlarged.  There are no abdominal masses.  There are no abdominal bruits.  Extremities reveal good pedal pulses.  There is no phlebitis.  There is trace ankle edema.  There is no cyanosis or clubbing.  Strength is normal and symmetrical in all extremities.  There is no lateralizing weakness.  There are no sensory deficits.  The skin is warm and dry.  There is no rash.   EKG today shows sinus bradycardia at 52 per minute with first degree AV block  and a PR interval of 326 ms.  There is a pattern of a right bundle branch block which is old.  There are nonspecific ST-T wave changes present.  Assessment / Plan: 1. ischemic heart disease status post CABG 16 years ago.  No recurrent angina. 2. chronic diastolic CHF, compensated. 3. hypertensive cardiovascular disease 4. diabetes mellitus,type II 5. right bundle branch block, old 6. sinus bradycardia and first degree AV block 7. sedentary lifestyle secondary to osteoarthritis of knees.  She has seen orthopedic surgery but no discussion of surgical intervention was made. 8. hypothyroidism  Recommendation: We will update her echocardiogram. Because of her marked sinus bradycardia and first-degree AV block we will reduce her beta blocker to just 25 mg metoprolol tartrate twice a day For additional blood pressure control we will increase her hydralazine to 25 mg 3 times a day I have recommended that she resume taking a baby aspirin daily because of her known ischemic heart disease.  I have also recommended that she reduce her Mobic to just 7.5 mg once a day because of the adverse effect that NSAIDS can have on cardiovascular disease. She will continue her heart healthy diabetic diet. I asked her to return in about 4 months for followup office visit and EKG, or sooner when necessary. We refilled her cardiovascular medications today. Many  thanks for the opportunity to see this pleasant woman with you.

## 2014-03-24 ENCOUNTER — Ambulatory Visit (HOSPITAL_COMMUNITY): Payer: Medicare Other | Attending: Cardiology | Admitting: Radiology

## 2014-03-24 DIAGNOSIS — R42 Dizziness and giddiness: Secondary | ICD-10-CM | POA: Insufficient documentation

## 2014-03-24 DIAGNOSIS — I440 Atrioventricular block, first degree: Secondary | ICD-10-CM | POA: Insufficient documentation

## 2014-03-24 DIAGNOSIS — I4510 Unspecified right bundle-branch block: Secondary | ICD-10-CM

## 2014-03-24 NOTE — Progress Notes (Signed)
Echocardiogram performed.  

## 2014-03-26 ENCOUNTER — Telehealth: Payer: Self-pay | Admitting: *Deleted

## 2014-03-26 NOTE — Telephone Encounter (Signed)
Message copied by Burnell BlanksPRATT, Malayah Demuro B on Fri Mar 26, 2014  5:13 PM ------      Message from: Cassell ClementBRACKBILL, THOMAS      Created: Thu Mar 25, 2014  9:00 PM       Please report.  The echo is stable. LV systolic function remains good. There is grade I diastolic dysfunction.      Continue current meds. ------

## 2014-03-26 NOTE — Telephone Encounter (Signed)
Advised patient

## 2014-05-24 ENCOUNTER — Other Ambulatory Visit: Payer: Self-pay | Admitting: Family Medicine

## 2014-05-24 DIAGNOSIS — R531 Weakness: Secondary | ICD-10-CM

## 2014-05-24 DIAGNOSIS — R42 Dizziness and giddiness: Secondary | ICD-10-CM

## 2014-05-27 ENCOUNTER — Other Ambulatory Visit: Payer: Medicare Other

## 2014-05-27 ENCOUNTER — Ambulatory Visit
Admission: RE | Admit: 2014-05-27 | Discharge: 2014-05-27 | Disposition: A | Discharge status: Home / Self Care | Payer: Medicare Other | Source: Ambulatory Visit | Attending: Family Medicine | Admitting: Family Medicine

## 2014-05-27 DIAGNOSIS — R531 Weakness: Secondary | ICD-10-CM

## 2014-05-27 DIAGNOSIS — R42 Dizziness and giddiness: Secondary | ICD-10-CM

## 2014-07-26 ENCOUNTER — Encounter: Payer: Self-pay | Admitting: Cardiology

## 2014-07-26 ENCOUNTER — Ambulatory Visit (INDEPENDENT_AMBULATORY_CARE_PROVIDER_SITE_OTHER): Payer: Medicare Other | Admitting: Cardiology

## 2014-07-26 VITALS — BP 136/64 | HR 66 | Ht 65.0 in | Wt 210.0 lb

## 2014-07-26 DIAGNOSIS — E1165 Type 2 diabetes mellitus with hyperglycemia: Secondary | ICD-10-CM

## 2014-07-26 DIAGNOSIS — Z951 Presence of aortocoronary bypass graft: Secondary | ICD-10-CM

## 2014-07-26 DIAGNOSIS — I119 Hypertensive heart disease without heart failure: Secondary | ICD-10-CM

## 2014-07-26 DIAGNOSIS — IMO0001 Reserved for inherently not codable concepts without codable children: Secondary | ICD-10-CM

## 2014-07-26 NOTE — Assessment & Plan Note (Signed)
She states that her blood sugars have improved significantly since Dr. Paulino Rily put her on metformin.  She is not having any hypoglycemic episodes.

## 2014-07-26 NOTE — Progress Notes (Signed)
Sierra Wright Date of Birth:  07-22-1934 [facility] [address] [place], [place]  [address] [phone]        Fax   [phone]   History of Present Illness: This pleasant 80 year old woman is seen for a followup office visit.  Her PCP is Dr. [name]. The patient has a history of known ischemic heart disease. She had successful CABG 16 years ago by Dr. [name]. She has not been experiencing any subsequent chest pain or angina. She notes occasional shortness of breath. She had an echocardiogram in October 2013 which showed normal left ventricular systolic function with ejection fraction greater than 55% and she did have diastolic dysfunction. She has a history of hypertension and she has a past history of mild fluid retention responding to Lasix. She has intermittent ankle edema. Her overall weight has been stable. She has occasional mild dizzy spells but no syncope. She has not been experiencing any palpitations or tachycardia. She is relatively sedentary because of significant osteoarthritis of her knees. She arrived in our office today by wheelchair. She sleeps with one pillow under her head and also a pillow under her feet to help with her mild dependent edema. During the day she wears compression hose. She does not have any history of TIA or stroke. She does not have any history of peptic ulcer disease or GI bleeding. She has a history of intolerance to cholesterol lowering drugs including statins, ezetimibe, and Welchol. Family history is positive for ischemic heart disease. Her father died at 57 and a brother died at 1 of heart attacks. The patient had an echocardiogram on 03/24/14 which demonstrated:- Left ventricle: The cavity size was normal. Wall thickness was increased in a pattern of mild LVH. There was focal basal hypertrophy. Systolic function was vigorous. The estimated ejection fraction was in the range of 65% to 70%. There was an increased  relative contribution of atrial contraction to ventricular filling. - Left atrium: The atrium was moderately dilated. - Right ventricle: The cavity size was mildly dilated. At her last visit we decreased her beta blocker because of bradycardia and we increased her hydralazine because of systolic hypertension.   Current Outpatient Prescriptions  Medication Sig Dispense Refill  . amLODipine (NORVASC) 10 MG tablet Take 1 tablet (10 mg total) by mouth daily.  30 tablet  5  . Ascorbic Acid (VITAMIN C ER PO) Take by mouth daily.      Marland Kitchen aspirin 81 MG tablet Take 81 mg by mouth daily.      . benazepril (LOTENSIN) 40 MG tablet TAKE ONE TABLET BY MOUTH ONCE DAILY  30 tablet  5  . Calcium Carb-Cholecalciferol (CALCIUM 1000 + D PO) Take by mouth daily.      . diazepam (VALIUM) 10 MG tablet Take 10 mg by mouth every 6 (six) hours as needed for anxiety.      . fish oil-omega-3 fatty acids 1000 MG capsule Take 1 g by mouth daily.      . furosemide (LASIX) 40 MG tablet Take 40 mg by mouth daily.      . Garlic 1000 MG CAPS Take by mouth daily.      Marland Kitchen glimepiride (AMARYL) 4 MG tablet Take 4 mg by mouth 2 (two) times daily.      . hydrALAZINE (APRESOLINE) 25 MG tablet Take 1 tablet (25 mg total) by mouth 3 (three) times daily.  90 tablet  5  . levothyroxine (SYNTHROID, LEVOTHROID) 112 MCG  tablet Take 112 mcg by mouth daily before breakfast.      . meloxicam (MOBIC) 7.5 MG tablet Take 7.5 mg by mouth daily.      . metFORMIN (GLUCOPHAGE) 500 MG tablet Take by mouth 2 (two) times daily with a meal.      . metoprolol (LOPRESSOR) 25 MG tablet TAKE ONE TABLET BY MOUTH TWICE DAILY  60 tablet  5  . potassium chloride (K-DUR,KLOR-CON) 10 MEQ tablet Take 10 mEq by mouth 4 (four) times daily.      Marland Kitchen rOPINIRole (REQUIP) 0.5 MG tablet Take 0.5 mg by mouth daily.      . vitamin B-12 (CYANOCOBALAMIN) 1000 MCG tablet Take 1,000 mcg by mouth daily.       No current facility-administered medications for this visit.     Allergies  Allergen Reactions  . Statins Other (See Comments)    Causes myalgias  . Welchol [Colesevelam Hcl] Other (See Comments)    Causes dizziness    Patient Active Problem List   Diagnosis Date Noted  . Hx of CABG 03/15/2014  . Osteoarthritis 03/15/2014  . Type II or unspecified type diabetes mellitus without mention of complication, uncontrolled 03/15/2014  . Peripheral edema 03/15/2014  . Benign hypertensive heart disease without heart failure 03/15/2014  . Right bundle branch block 03/15/2014  . First degree AV block 03/15/2014  . Sinus bradycardia by electrocardiogram 03/15/2014  . Dizziness 03/15/2014    History  Smoking status  . Never Smoker   Smokeless tobacco  . Not on file    History  Alcohol Use: Not on file    Family history reveals father died of a heart attack at age 70 and a brother died of a heart attack at age 9  Review of Systems: Constitutional: no fever chills diaphoresis or fatigue or change in weight.  Head and neck: no hearing loss, no epistaxis, no photophobia or visual disturbance. Respiratory: No cough, shortness of breath or wheezing. Cardiovascular: No chest pain peripheral edema, palpitations. Gastrointestinal: No abdominal distention, no abdominal pain, no change in bowel habits hematochezia or melena. Genitourinary: No dysuria, no frequency, no urgency, no nocturia. Musculoskeletal:No arthralgias, no back pain, no gait disturbance or myalgias. Neurological: No dizziness, no headaches, no numbness, no seizures, no syncope, no weakness, no tremors. Hematologic: No lymphadenopathy, no easy bruising. Psychiatric: No confusion, no hallucinations, no sleep disturbance.    Physical Exam: Filed Vitals:   07/26/14 1406  BP: 136/64  Pulse: 66   the general appearance reveals a well-developed well-nourished woman in no distress.  She is in a wheelchair because of arthritis of the knees.The head and neck exam reveals pupils equal and  reactive.  Extraocular movements are full.  There is no scleral icterus.  The mouth and pharynx are normal.  The neck is supple.  The carotids reveal no bruits.  The jugular venous pressure is normal.  The  thyroid is not enlarged.  There is no lymphadenopathy.  The chest is clear to percussion and auscultation.  There are no rales or rhonchi.  Expansion of the chest is symmetrical.  The precordium is quiet.  The first heart sound is normal.  The second heart sound is physiologically split.  There is no murmur gallop rub or click.  There is no abnormal lift or heave.  The abdomen is soft and nontender.  The bowel sounds are normal.  The liver and spleen are not enlarged.  There are no abdominal masses.  There are no abdominal bruits.  Extremities reveal good pedal pulses.  There is no phlebitis or edema.  There is no cyanosis or clubbing.  Strength is normal and symmetrical in all extremities.  There is no lateralizing weakness.  There are no sensory deficits.  The skin is warm and dry.  There is no rash.       Assessment / Plan:  1. ischemic heart disease status post CABG 16 years ago. No recurrent angina.  2. chronic diastolic CHF, compensated.  3. hypertensive cardiovascular disease  4. diabetes mellitus,type II  5. right bundle branch block, old  6. sinus bradycardia and first degree AV block  7. sedentary lifestyle secondary to osteoarthritis of knees. She has seen orthopedic surgery but no discussion of surgical intervention was made.  8. hypothyroidism   Recommendation:  Continue current dose of beta blocker metoprolol.  I have recommended that she continue taking a baby aspirin daily because of her known ischemic heart disease.  I asked her to return in about 6 months for followup office visit and EKG. Continue heart healthy diabetic diet.  Many thanks for the opportunity to see this pleasant woman with you.

## 2014-07-26 NOTE — Assessment & Plan Note (Signed)
Her blood pressure has been stable since last visit.  She is not having any significant dyspnea or orthopnea or paroxysmal nocturnal dyspnea.  She states that she feels better on the lower dose of metoprolol.

## 2014-07-26 NOTE — Assessment & Plan Note (Signed)
The patient has not been experiencing any angina pectoris. 

## 2014-07-26 NOTE — Patient Instructions (Signed)
Your physician recommends that you continue on your current medications as directed. Please refer to the Current Medication list given to you today.  Your physician wants you to follow-up in: 6 month ov/ekg You will receive a reminder letter in the mail two months in advance. If you don't receive a letter, please call our office to schedule the follow-up appointment.  

## 2014-09-08 ENCOUNTER — Other Ambulatory Visit: Payer: Self-pay | Admitting: Cardiology

## 2014-11-21 ENCOUNTER — Other Ambulatory Visit: Payer: Self-pay | Admitting: Cardiology

## 2014-12-14 ENCOUNTER — Other Ambulatory Visit: Payer: Self-pay | Admitting: Cardiology

## 2015-01-15 ENCOUNTER — Other Ambulatory Visit: Payer: Self-pay | Admitting: Cardiology

## 2015-01-19 ENCOUNTER — Other Ambulatory Visit: Payer: Self-pay | Admitting: Cardiology

## 2015-01-24 ENCOUNTER — Ambulatory Visit: Payer: Medicare Other | Admitting: Cardiology

## 2015-01-26 ENCOUNTER — Ambulatory Visit (INDEPENDENT_AMBULATORY_CARE_PROVIDER_SITE_OTHER): Payer: Medicare Other | Admitting: Cardiology

## 2015-01-26 ENCOUNTER — Encounter: Payer: Self-pay | Admitting: Cardiology

## 2015-01-26 VITALS — BP 124/76 | HR 66 | Ht 65.0 in | Wt 218.0 lb

## 2015-01-26 DIAGNOSIS — I119 Hypertensive heart disease without heart failure: Secondary | ICD-10-CM

## 2015-01-26 DIAGNOSIS — I4510 Unspecified right bundle-branch block: Secondary | ICD-10-CM

## 2015-01-26 DIAGNOSIS — Z951 Presence of aortocoronary bypass graft: Secondary | ICD-10-CM

## 2015-01-26 DIAGNOSIS — R609 Edema, unspecified: Secondary | ICD-10-CM

## 2015-01-26 MED ORDER — AMLODIPINE BESYLATE 5 MG PO TABS: 5.0000 mg | ORAL_TABLET | Freq: Every day | ORAL | Status: DC

## 2015-01-26 NOTE — Patient Instructions (Signed)
DECREASE YOUR AMLODIPINE TO 5 MG DAILY   Your physician wants you to follow-up in: 6 MONTH OV  You will receive a reminder letter in the mail two months in advance. If you don't receive a letter, please call our office to schedule the follow-up appointment.

## 2015-01-26 NOTE — Progress Notes (Signed)
Cardiology Office Note   Date:  01/26/2015   ID:  Sierra Wright, DOB [birth year], MRN [id number]  PCP:  Emeterio Reeve, MD  Cardiologist:   Cassell Clement, MD   No chief complaint on file.     History of Present Illness: Sierra Wright is a 81 y.o. female who presents for a six-month follow-up office visit.  This pleasant 81 year old woman is seen for a followup office visit. Her PCP is Dr. [name]. The patient has a history of known ischemic heart disease. She had successful CABG 16 years ago by Dr. [name]. She has not been experiencing any subsequent chest pain or angina. She notes occasional shortness of breath. She had an echocardiogram in October 2013 which showed normal left ventricular systolic function with ejection fraction greater than 55% and she did have diastolic dysfunction. She has a history of hypertension and she has a past history of mild fluid retention responding to Lasix. She has intermittent ankle edema.  Recently her ankle edema has worsened.  She has gained 8 pounds since last visit. She has occasional mild dizzy spells but no syncope. She has not been experiencing any palpitations or tachycardia. She is relatively sedentary because of significant osteoarthritis of her knees. She arrived in our office today by wheelchair. She sleeps with one pillow under her head and also a pillow under her feet to help with her mild dependent edema. During the day she wears compression hose. She does not have any history of TIA or stroke. She does not have any history of peptic ulcer disease or GI bleeding. She has a history of intolerance to cholesterol lowering drugs including statins, ezetimibe, and Welchol. Family history is positive for ischemic heart disease. Her father died at 32 and a brother died at 26 of heart attacks. The patient had an echocardiogram on 03/24/14 which demonstrated:- Left ventricle: The cavity size was normal. Wall thickness was increased in a  pattern of mild LVH. There was focal basal hypertrophy. Systolic function was vigorous. The estimated ejection fraction was in the range of 65% to 70%. There was an increased relative contribution of atrial contraction to ventricular filling. - Left atrium: The atrium was moderately dilated. - Right ventricle: The cavity size was mildly dilated. At her last visit we decreased her beta blocker because of bradycardia and we increased her hydralazine because of systolic hypertension.  Since then her blood pressure has been satisfactory.  Past Medical History  Diagnosis Date  . Coronary artery disease 1999    CABG  . Shortness of breath 09/03/12    ECHO- The LA is Moderately Dilated, mild mitral annular calcification. mild pulmonary hypertension, moderate concentric LV hypertrophy. Atrial septum is aneurysmal. Aortic valve appears to be mildly sclerotic.EF 88%  . Hypertension   . Diabetes mellitus without complication     Past Surgical History  Procedure Laterality Date  . Cardiac catherization  08/20/03    Widely patent bypass grafts. Normal left ventricular systolic function.     Current Outpatient Prescriptions  Medication Sig Dispense Refill  . amLODipine (NORVASC) 5 MG tablet Take 1 tablet (5 mg total) by mouth daily. 30 tablet 5  . Ascorbic Acid (VITAMIN C ER PO) Take 1,000 mg by mouth daily.     Marland Kitchen aspirin 81 MG tablet Take 81 mg by mouth daily.    . benazepril (LOTENSIN) 40 MG tablet TAKE ONE TABLET BY MOUTH ONCE DAILY 90 tablet 3  . Calcium Carb-Cholecalciferol (CALCIUM 1000 + D  PO) Take 1,000 mg by mouth 2 (two) times daily.     . diazepam (VALIUM) 10 MG tablet Take 10 mg by mouth every 6 (six) hours as needed for anxiety.    . fish oil-omega-3 fatty acids 1000 MG capsule Take 1 g by mouth daily.    . furosemide (LASIX) 40 MG tablet TAKE ONE TABLET BY MOUTH TWICE DAILY (Patient taking differently: TAKE ONE TABLET BY MOUTH  DAILY) 60 tablet 0  . Garlic 1000 MG CAPS Take by  mouth daily.    Marland Kitchen. glimepiride (AMARYL) 4 MG tablet Take 4 mg by mouth 2 (two) times daily.    . hydrALAZINE (APRESOLINE) 25 MG tablet TAKE ONE TABLET BY MOUTH THREE TIMES DAILY 90 tablet 0  . levothyroxine (SYNTHROID, LEVOTHROID) 112 MCG tablet Take 112 mcg by mouth daily before breakfast.    . meloxicam (MOBIC) 7.5 MG tablet Take 7.5 mg by mouth daily.    . metFORMIN (GLUCOPHAGE) 500 MG tablet Take by mouth 2 (two) times daily with a meal.    . metoprolol tartrate (LOPRESSOR) 25 MG tablet TAKE ONE TABLET BY MOUTH TWICE DAILY. NEED TO CONTACT OFFICE TO SCHEDULE APPOINTMENT FOR FUTURE REFILLS 60 tablet 0  . potassium chloride (K-DUR,KLOR-CON) 10 MEQ tablet Take 10 mEq by mouth 4 (four) times daily.    Marland Kitchen. rOPINIRole (REQUIP) 0.5 MG tablet Take 0.5 mg by mouth daily.    . vitamin B-12 (CYANOCOBALAMIN) 1000 MCG tablet Take 1,000 mcg by mouth daily.     No current facility-administered medications for this visit.    Allergies:   Statins and Welchol    Social History:  The patient  reports that she has never smoked. She does not have any smokeless tobacco history on file.   Family History:  The patient's family history includes Heart attack in her father; Other in her mother.    ROS:  Please see the history of present illness.   Otherwise, review of systems are positive for none.   All other systems are reviewed and negative.    PHYSICAL EXAM: VS:  BP 124/76 mmHg  Pulse 66  Ht 5\' 5"  (1.651 m)  Wt 218 lb (98.884 kg)  BMI 36.28 kg/m2 , BMI Body mass index is 36.28 kg/(m^2). GEN: Well nourished, well developed, in no acute distress HEENT: normal Neck: no JVD, carotid bruits, or masses Cardiac: RRR; no murmurs, rubs, or gallops,there is moderate edema Respiratory:  clear to auscultation bilaterally, normal work of breathing GI: soft, nontender, nondistended, + BS MS: no deformity or atrophy Skin: warm and dry, no rash Neuro:  Strength and sensation are intact Psych: euthymic mood, full  affect   EKG:  EKG  Shows NSR with RBBB    Recent Labs: No results found for requested labs within last 365 days.    Lipid Panel No results found for: CHOL, TRIG, HDL, CHOLHDL, VLDL, LDLCALC, LDLDIRECT    Wt Readings from Last 3 Encounters:  01/26/15 218 lb (98.884 kg)  07/26/14 210 lb (95.255 kg)  03/15/14 212 lb (96.163 kg)        ASSESSMENT AND PLAN:  1. ischemic heart disease status post CABG 16 years ago. No recurrent angina.  2. chronic diastolic CHF, compensated.  3. hypertensive cardiovascular disease  4. diabetes mellitus,type II  5. right bundle branch block, old  6. sinus bradycardia and first degree AV block  7. sedentary lifestyle secondary to osteoarthritis of knees. She has seen orthopedic surgery but no discussion of surgical intervention  was made.  8. hypothyroidism    Current medicines are reviewed at length with the patient today.  The patient does not have concerns regarding medicines.  The following changes have been made:  Reduce amlodipine to 5 mg daily because of worsening pedal edema.    Orders Placed This Encounter  Procedures  . EKG 12-Lead     Disposition:   FU with Dr. [name] in 6 months   Signed, Cassell Clement, MD  01/26/2015 5:31 PM    [facility] [address] [place], [place], [place]  [address] Phone: [phone]; Fax: [phone]

## 2015-02-17 ENCOUNTER — Telehealth: Payer: Self-pay | Admitting: Adult Health

## 2015-02-17 NOTE — Telephone Encounter (Signed)
Received fax refill request  Rx # [id number] Medication:  Metoprolol TART 25 mg tab Qty 60 Sig:  Take one tablet by mouth twice daily  Physician:  Lyman BishopLawrence Received fax refill request  Rx # [id number] Medication:  Hydra-lazine HCL 25 mg tab Qty 90 Sig:  Take one tablet by mouth three times daily Physician:  Lyman BishopLawrence

## 2015-02-18 ENCOUNTER — Other Ambulatory Visit: Payer: Self-pay

## 2015-02-18 MED ORDER — METOPROLOL TARTRATE 25 MG PO TABS: ORAL_TABLET | ORAL | Status: DC

## 2015-02-23 ENCOUNTER — Other Ambulatory Visit: Payer: Self-pay | Admitting: *Deleted

## 2015-02-23 MED ORDER — HYDRALAZINE HCL 25 MG PO TABS: 25.0000 mg | ORAL_TABLET | Freq: Three times a day (TID) | ORAL | Status: DC

## 2015-03-18 ENCOUNTER — Other Ambulatory Visit: Payer: Self-pay | Admitting: Cardiology

## 2015-03-18 NOTE — Telephone Encounter (Signed)
PATIENT TAKING DIFFERENTLY/ONE DAILY PER OFFICE NOTE

## 2015-03-23 ENCOUNTER — Ambulatory Visit (INDEPENDENT_AMBULATORY_CARE_PROVIDER_SITE_OTHER): Payer: Medicare Other | Admitting: Ophthalmology

## 2015-03-23 DIAGNOSIS — I10 Essential (primary) hypertension: Secondary | ICD-10-CM | POA: Diagnosis not present

## 2015-03-23 DIAGNOSIS — E11319 Type 2 diabetes mellitus with unspecified diabetic retinopathy without macular edema: Secondary | ICD-10-CM | POA: Diagnosis not present

## 2015-03-23 DIAGNOSIS — H35033 Hypertensive retinopathy, bilateral: Secondary | ICD-10-CM | POA: Diagnosis not present

## 2015-03-23 DIAGNOSIS — E11329 Type 2 diabetes mellitus with mild nonproliferative diabetic retinopathy without macular edema: Secondary | ICD-10-CM

## 2015-03-23 DIAGNOSIS — H43813 Vitreous degeneration, bilateral: Secondary | ICD-10-CM | POA: Diagnosis not present

## 2015-04-05 ENCOUNTER — Other Ambulatory Visit: Payer: Self-pay | Admitting: Specialist

## 2015-04-05 DIAGNOSIS — M545 Low back pain: Secondary | ICD-10-CM

## 2015-04-05 DIAGNOSIS — M25521 Pain in right elbow: Secondary | ICD-10-CM

## 2015-04-27 ENCOUNTER — Ambulatory Visit
Admission: RE | Admit: 2015-04-27 | Discharge: 2015-04-27 | Disposition: A | Discharge status: Home / Self Care | Payer: Medicare Other | Source: Ambulatory Visit | Attending: Specialist | Admitting: Specialist

## 2015-04-27 DIAGNOSIS — M545 Low back pain: Secondary | ICD-10-CM

## 2015-04-27 DIAGNOSIS — M25521 Pain in right elbow: Secondary | ICD-10-CM

## 2015-06-29 ENCOUNTER — Other Ambulatory Visit: Payer: Self-pay | Admitting: Family Medicine

## 2015-06-29 DIAGNOSIS — E2839 Other primary ovarian failure: Secondary | ICD-10-CM

## 2015-07-01 ENCOUNTER — Other Ambulatory Visit (HOSPITAL_COMMUNITY): Payer: Self-pay | Admitting: Specialist

## 2015-07-07 ENCOUNTER — Encounter (HOSPITAL_COMMUNITY): Payer: Self-pay

## 2015-07-07 ENCOUNTER — Ambulatory Visit
Admission: RE | Admit: 2015-07-07 | Discharge: 2015-07-07 | Disposition: A | Discharge status: Home / Self Care | Payer: Medicare Other | Source: Ambulatory Visit | Attending: Family Medicine | Admitting: Family Medicine

## 2015-07-07 DIAGNOSIS — E2839 Other primary ovarian failure: Secondary | ICD-10-CM

## 2015-07-07 NOTE — Progress Notes (Addendum)
Anesthesia Chart Review: Patient is a 81 year old female scheduled for bilateral L3-4 subtotal hemilaminectomy with lateral recess decompression on 07/18/15.   Cardiology clearance note received from Dr. [name]. PCP is Dr. [name].  History includes non-smoker, CAD s/p CABG (LIMA-LAD, SVG-OM, SVG-RCA) 1999, DM2, HTN, SOB, hypothyroidism, RLS, hard of hearing, post-operative N/V, cholecystectomy. BMI is consistent with obesity.   Meds include amlodipine, ASA 81 mg, benazepril, Cymbalta, fish oil, Lasix, Amaryl, hydralazine, metformin, levothyroxine, Lopressor, KCl, Requip.   01/26/15 EKG: SR with first degree AVB, right BBB, non-specific ST/T wave abnormality. T wave abnormality less prominent when compared to 03/15/14 tracing.   03/24/14 Echo: - Left ventricle: The cavity size was normal. Wall thickness was increased in a pattern of mild LVH. There was focal basal hypertrophy. Systolic function was vigorous. The estimated ejection fraction was in the range of 65% to 70%. There was an increased relative contribution of atrial contraction to ventricular filling. - Left atrium: The atrium was moderately dilated. - Right ventricle: The cavity size was mildly dilated.  08/20/03 LHC (done following false positive stress test): CONCLUSIONS: 1. Widely patent bypass grafts. 2. Normal left ventricular systolic function.  Preoperative labs noted. A1C pending.   Patient has been cleared by her cardiologist. If no acute changes then I would anticipate that she could proceed as planned.  Velna Ochs [facility] Short Stay Center/Anesthesiology Phone [phone] 07/08/2015 3:51 PM

## 2015-07-08 ENCOUNTER — Encounter (HOSPITAL_COMMUNITY): Payer: Self-pay

## 2015-07-08 ENCOUNTER — Encounter (HOSPITAL_COMMUNITY)
Admission: RE | Admit: 2015-07-08 | Discharge: 2015-07-08 | Disposition: A | Discharge status: Home / Self Care | Payer: Medicare Other | Source: Ambulatory Visit | Attending: Specialist | Admitting: Specialist

## 2015-07-08 DIAGNOSIS — E039 Hypothyroidism, unspecified: Secondary | ICD-10-CM | POA: Diagnosis not present

## 2015-07-08 DIAGNOSIS — Z01812 Encounter for preprocedural laboratory examination: Secondary | ICD-10-CM | POA: Insufficient documentation

## 2015-07-08 DIAGNOSIS — I2510 Atherosclerotic heart disease of native coronary artery without angina pectoris: Secondary | ICD-10-CM | POA: Insufficient documentation

## 2015-07-08 DIAGNOSIS — I10 Essential (primary) hypertension: Secondary | ICD-10-CM | POA: Diagnosis not present

## 2015-07-08 DIAGNOSIS — Z79899 Other long term (current) drug therapy: Secondary | ICD-10-CM | POA: Diagnosis not present

## 2015-07-08 DIAGNOSIS — M4806 Spinal stenosis, lumbar region: Secondary | ICD-10-CM | POA: Insufficient documentation

## 2015-07-08 DIAGNOSIS — Z951 Presence of aortocoronary bypass graft: Secondary | ICD-10-CM | POA: Diagnosis not present

## 2015-07-08 DIAGNOSIS — Z7982 Long term (current) use of aspirin: Secondary | ICD-10-CM | POA: Insufficient documentation

## 2015-07-08 DIAGNOSIS — H9190 Unspecified hearing loss, unspecified ear: Secondary | ICD-10-CM | POA: Diagnosis not present

## 2015-07-08 DIAGNOSIS — E119 Type 2 diabetes mellitus without complications: Secondary | ICD-10-CM | POA: Diagnosis not present

## 2015-07-08 DIAGNOSIS — Z01818 Encounter for other preprocedural examination: Secondary | ICD-10-CM | POA: Diagnosis present

## 2015-07-08 HISTORY — DX: Major depressive disorder, single episode, unspecified: F32.9

## 2015-07-08 HISTORY — DX: Other specified postprocedural states: R11.2

## 2015-07-08 HISTORY — DX: Other symptoms and signs involving the musculoskeletal system: R29.898

## 2015-07-08 HISTORY — DX: Unspecified osteoarthritis, unspecified site: M19.90

## 2015-07-08 HISTORY — DX: Other specified postprocedural states: Z98.890

## 2015-07-08 HISTORY — DX: Personal history of diseases of the blood and blood-forming organs and certain disorders involving the immune mechanism: Z86.2

## 2015-07-08 HISTORY — DX: Hypothyroidism, unspecified: E03.9

## 2015-07-08 HISTORY — DX: Unspecified urinary incontinence: R32

## 2015-07-08 HISTORY — DX: Restless legs syndrome: G25.81

## 2015-07-08 HISTORY — DX: Depression, unspecified: F32.A

## 2015-07-08 HISTORY — DX: Unspecified hearing loss, unspecified ear: H91.90

## 2015-07-08 HISTORY — DX: Type 2 diabetes mellitus with diabetic neuropathy, unspecified: E11.40

## 2015-07-08 LAB — CBC
HCT: 40.3 % (ref 36.0–46.0)
HEMOGLOBIN: 13.5 g/dL (ref 12.0–15.0)
MCH: 31.7 pg (ref 26.0–34.0)
MCHC: 33.5 g/dL (ref 30.0–36.0)
MCV: 94.6 fL (ref 78.0–100.0)
PLATELETS: 166 10*3/uL (ref 150–400)
RBC: 4.26 MIL/uL (ref 3.87–5.11)
RDW: 12.7 % (ref 11.5–15.5)
WBC: 8.3 10*3/uL (ref 4.0–10.5)

## 2015-07-08 LAB — COMPREHENSIVE METABOLIC PANEL
ALT: 22 U/L (ref 14–54)
AST: 27 U/L (ref 15–41)
Albumin: 3.5 g/dL (ref 3.5–5.0)
Alkaline Phosphatase: 52 U/L (ref 38–126)
Anion gap: 11 (ref 5–15)
BUN: 19 mg/dL (ref 6–20)
CALCIUM: 9.2 mg/dL (ref 8.9–10.3)
CO2: 21 mmol/L — ABNORMAL LOW (ref 22–32)
Chloride: 103 mmol/L (ref 101–111)
Creatinine, Ser: 1.03 mg/dL — ABNORMAL HIGH (ref 0.44–1.00)
GFR calc Af Amer: 58 mL/min — ABNORMAL LOW (ref >60)
GFR calc non Af Amer: 50 mL/min — ABNORMAL LOW (ref >60)
GLUCOSE: 267 mg/dL — AB (ref 65–99)
Potassium: 4.4 mmol/L (ref 3.5–5.1)
Sodium: 135 mmol/L (ref 135–145)
Total Bilirubin: 0.7 mg/dL (ref 0.3–1.2)
Total Protein: 5.8 g/dL — ABNORMAL LOW (ref 6.5–8.1)

## 2015-07-08 LAB — GLUCOSE, CAPILLARY: Glucose-Capillary: 235 mg/dL — ABNORMAL HIGH (ref 65–99)

## 2015-07-08 LAB — SURGICAL PCR SCREEN
MRSA, PCR: NEGATIVE
Staphylococcus aureus: NEGATIVE

## 2015-07-08 MED ORDER — CEFAZOLIN SODIUM-DEXTROSE 2-3 GM-% IV SOLR: 2.0000 g | INTRAVENOUS | Status: DC

## 2015-07-08 MED ORDER — CHLORHEXIDINE GLUCONATE 4 % EX LIQD: 60.0000 mL | Freq: Once | CUTANEOUS | Status: DC

## 2015-07-08 NOTE — Progress Notes (Signed)
PCP - Dr. Paulino Rily Cardiologist - Dr. [name] - Cardio clearance in chart.  EKG- February 2016 0 in Epic CXR - denies Echo - April 2015 - Epic Stress Test- denies Cardiac Cath - 2004 - in Epic  Chart sent to [facility] for review - abnormal EKG and extensive heart history.

## 2015-07-08 NOTE — Pre-Procedure Instructions (Signed)
Sierra Wright  07/08/2015      [facility] - [place], [place] - [address] [place] [address] Phone: [phone] Fax: [phone]    Your procedure is scheduled on Monday, August 15th, 2016  Report to [facility] Admitting at 5:30 A.M.  Call this number if you have problems the morning of surgery:  [phone]   Remember:  Do not eat food or drink liquids after midnight.   Take these medicines the morning of surgery with A SIP OF WATER: Acetaminophen-Codeine (Tylenol #3) if needed, Amlodipine (Norvasc), Diazepam (Valium), Duloxetine (Cymbalta), Levothyroxine (Synthroid),  Metoprolol Tartrate (Lopressor).  7 days prior to surgery (Monday, August 8th), Stop taking: NSAIDS, Aspirin, Aleve, Naproxen, Ibuprofen, fish oil, all herbal medications and all vitamins.    How to Manage Your Diabetes Before Surgery   Why is it important to control my blood sugar before and after surgery?   Improving blood sugar levels before and after surgery helps healing and can limit problems.  A way of improving blood sugar control is eating a healthy diet by:  - Eating less sugar and carbohydrates  - Increasing activity/exercise  - Talk with your doctor about reaching your blood sugar goals  High blood sugars (greater than 180 mg/dL) can raise your risk of infections and slow down your recovery so you will need to focus on controlling your diabetes during the weeks before surgery.  Make sure that the doctor who takes care of your diabetes knows about your planned surgery including the date and location.  How do I manage my blood sugars before surgery?   Check your blood sugar at least 4 times a day, 2 days before surgery to make sure that they are not too high or low.   Check your blood sugar the morning of your surgery when you wake up and every 2 hours until you get to the Short-Stay unit.  If your blood sugar is less than 70  mg/dL, you will need to treat for low blood sugar by:  Treat a low blood sugar (less than 70 mg/dL) with 1/2 cup of clear juice (cranberry or apple), 4 glucose tablets, OR glucose gel.  Recheck blood sugar in 15 minutes after treatment (to make sure it is greater than 70 mg/dL).  If blood sugar is not greater than 70 mg/dL on re-check, call [phone] for further instructions.   Report your blood sugar to the Short-Stay nurse when you get to Short-Stay.  References:  [facility], 2007 "How to Manage your Diabetes Before and After Surgery".  What do I do about my diabetes medications?   Do not take oral diabetes medicines (pills) the morning of surgery    If your CBG is greater than 220 mg/dL, you may take 1/2 of your sliding scale (correction) dose of insulin.    Do not wear jewelry, make-up or nail polish.  Do not wear lotions, powders, or perfumes.  You may NOT  wear deodorant.  Do not shave 48 hours prior to surgery.    Do not bring valuables to the hospital.  [facility] is not responsible for any belongings or valuables.  Contacts, dentures or bridgework may not be worn into surgery.  Leave your suitcase in the car.  After surgery it may be brought to your room.  For patients admitted to the hospital, discharge time will be determined by your treatment team.  Patients discharged the day of surgery  will not be allowed to drive home.   Special instructions:  See attached.   Please read over the following fact sheets that you were given. Pain Booklet, Coughing and Deep Breathing, MRSA Information and Surgical Site Infection Prevention

## 2015-07-09 LAB — HEMOGLOBIN A1C
Hgb A1c MFr Bld: 7.4 % — ABNORMAL HIGH (ref 4.8–5.6)
MEAN PLASMA GLUCOSE: 166 mg/dL

## 2015-07-15 ENCOUNTER — Other Ambulatory Visit: Payer: Self-pay | Admitting: Cardiology

## 2015-07-25 ENCOUNTER — Encounter (HOSPITAL_COMMUNITY): Payer: Self-pay | Admitting: *Deleted

## 2015-07-25 MED ORDER — CHLORHEXIDINE GLUCONATE 4 % EX LIQD: 60.0000 mL | Freq: Once | CUTANEOUS | Status: DC

## 2015-07-25 NOTE — Progress Notes (Signed)
Pt had a PAT appt on 07/08/15, surgery moved to a later date. Called pt today to review allergies, medication, medical and surgical history. Pt states nothing has changed with any of the above since her appt. She denies any recent chest pain, sob, stroke like symptoms and she states her fasting blood sugars run around 140. Gave her pre-op instructions according to pre-op call checklist.

## 2015-07-26 ENCOUNTER — Encounter (HOSPITAL_COMMUNITY): Payer: Self-pay | Admitting: Anesthesiology

## 2015-07-26 ENCOUNTER — Encounter (HOSPITAL_COMMUNITY)
Admission: RE | Disposition: A | Discharge status: Home / Self Care | Payer: Self-pay | Source: Ambulatory Visit | Attending: Specialist

## 2015-07-26 ENCOUNTER — Inpatient Hospital Stay (HOSPITAL_COMMUNITY): Payer: Medicare Other

## 2015-07-26 ENCOUNTER — Inpatient Hospital Stay (HOSPITAL_COMMUNITY): Payer: Medicare Other | Admitting: Vascular Surgery

## 2015-07-26 ENCOUNTER — Inpatient Hospital Stay (HOSPITAL_COMMUNITY): Payer: Medicare Other | Admitting: Anesthesiology

## 2015-07-26 ENCOUNTER — Observation Stay (HOSPITAL_COMMUNITY)
Admission: RE | Admit: 2015-07-26 | Discharge: 2015-07-27 | Disposition: A | Discharge status: Home / Self Care | Payer: Medicare Other | Source: Ambulatory Visit | Attending: Specialist | Admitting: Specialist

## 2015-07-26 DIAGNOSIS — R29898 Other symptoms and signs involving the musculoskeletal system: Secondary | ICD-10-CM

## 2015-07-26 DIAGNOSIS — M810 Age-related osteoporosis without current pathological fracture: Secondary | ICD-10-CM | POA: Diagnosis not present

## 2015-07-26 DIAGNOSIS — I2510 Atherosclerotic heart disease of native coronary artery without angina pectoris: Secondary | ICD-10-CM | POA: Insufficient documentation

## 2015-07-26 DIAGNOSIS — M4806 Spinal stenosis, lumbar region: Secondary | ICD-10-CM | POA: Diagnosis not present

## 2015-07-26 DIAGNOSIS — E039 Hypothyroidism, unspecified: Secondary | ICD-10-CM | POA: Diagnosis not present

## 2015-07-26 DIAGNOSIS — F329 Major depressive disorder, single episode, unspecified: Secondary | ICD-10-CM | POA: Diagnosis not present

## 2015-07-26 DIAGNOSIS — E1140 Type 2 diabetes mellitus with diabetic neuropathy, unspecified: Secondary | ICD-10-CM | POA: Insufficient documentation

## 2015-07-26 DIAGNOSIS — I10 Essential (primary) hypertension: Secondary | ICD-10-CM | POA: Diagnosis not present

## 2015-07-26 DIAGNOSIS — G2581 Restless legs syndrome: Secondary | ICD-10-CM | POA: Diagnosis not present

## 2015-07-26 DIAGNOSIS — I480 Paroxysmal atrial fibrillation: Secondary | ICD-10-CM | POA: Diagnosis not present

## 2015-07-26 DIAGNOSIS — Z79891 Long term (current) use of opiate analgesic: Secondary | ICD-10-CM | POA: Diagnosis not present

## 2015-07-26 DIAGNOSIS — M4316 Spondylolisthesis, lumbar region: Secondary | ICD-10-CM | POA: Diagnosis present

## 2015-07-26 DIAGNOSIS — Z951 Presence of aortocoronary bypass graft: Secondary | ICD-10-CM | POA: Insufficient documentation

## 2015-07-26 DIAGNOSIS — M48062 Spinal stenosis, lumbar region with neurogenic claudication: Secondary | ICD-10-CM | POA: Diagnosis present

## 2015-07-26 DIAGNOSIS — Z7982 Long term (current) use of aspirin: Secondary | ICD-10-CM | POA: Insufficient documentation

## 2015-07-26 HISTORY — PX: LUMBAR LAMINECTOMY/DECOMPRESSION MICRODISCECTOMY: SHX5026

## 2015-07-26 LAB — CBC WITH DIFFERENTIAL/PLATELET
Basophils Absolute: 0 K/uL (ref 0.0–0.1)
Basophils Relative: 0 % (ref 0–1)
Eosinophils Absolute: 0 K/uL (ref 0.0–0.7)
Eosinophils Relative: 0 % (ref 0–5)
HCT: 40.5 % (ref 36.0–46.0)
Hemoglobin: 14 g/dL (ref 12.0–15.0)
Lymphocytes Relative: 13 % (ref 12–46)
Lymphs Abs: 1.4 K/uL (ref 0.7–4.0)
MCH: 32.3 pg (ref 26.0–34.0)
MCHC: 34.6 g/dL (ref 30.0–36.0)
MCV: 93.3 fL (ref 78.0–100.0)
Monocytes Absolute: 0.2 K/uL (ref 0.1–1.0)
Monocytes Relative: 2 % — ABNORMAL LOW (ref 3–12)
Neutro Abs: 8.6 K/uL — ABNORMAL HIGH (ref 1.7–7.7)
Neutrophils Relative %: 85 % — ABNORMAL HIGH (ref 43–77)
Platelets: 176 K/uL (ref 150–400)
RBC: 4.34 MIL/uL (ref 3.87–5.11)
RDW: 12.8 % (ref 11.5–15.5)
WBC: 10.2 K/uL (ref 4.0–10.5)

## 2015-07-26 LAB — COMPREHENSIVE METABOLIC PANEL WITH GFR
ALT: 57 U/L — ABNORMAL HIGH (ref 14–54)
AST: 56 U/L — ABNORMAL HIGH (ref 15–41)
Albumin: 3.5 g/dL (ref 3.5–5.0)
Alkaline Phosphatase: 58 U/L (ref 38–126)
Anion gap: 12 (ref 5–15)
BUN: 12 mg/dL (ref 6–20)
CO2: 21 mmol/L — ABNORMAL LOW (ref 22–32)
Calcium: 8.6 mg/dL — ABNORMAL LOW (ref 8.9–10.3)
Chloride: 103 mmol/L (ref 101–111)
Creatinine, Ser: 0.93 mg/dL (ref 0.44–1.00)
GFR calc Af Amer: >60 mL/min (ref >60)
GFR calc non Af Amer: 57 mL/min — ABNORMAL LOW (ref >60)
Glucose, Bld: 235 mg/dL — ABNORMAL HIGH (ref 65–99)
Potassium: 3.5 mmol/L (ref 3.5–5.1)
Sodium: 136 mmol/L (ref 135–145)
Total Bilirubin: 1.1 mg/dL (ref 0.3–1.2)
Total Protein: 5.8 g/dL — ABNORMAL LOW (ref 6.5–8.1)

## 2015-07-26 LAB — BASIC METABOLIC PANEL
ANION GAP: 11 (ref 5–15)
BUN: 10 mg/dL (ref 6–20)
CHLORIDE: 104 mmol/L (ref 101–111)
CO2: 23 mmol/L (ref 22–32)
Calcium: 9.2 mg/dL (ref 8.9–10.3)
Creatinine, Ser: 0.87 mg/dL (ref 0.44–1.00)
GFR calc non Af Amer: >60 mL/min (ref >60)
Glucose, Bld: 153 mg/dL — ABNORMAL HIGH (ref 65–99)
POTASSIUM: 3.4 mmol/L — AB (ref 3.5–5.1)
Sodium: 138 mmol/L (ref 135–145)

## 2015-07-26 LAB — GLUCOSE, CAPILLARY
GLUCOSE-CAPILLARY: 282 mg/dL — AB (ref 65–99)
Glucose-Capillary: 150 mg/dL — ABNORMAL HIGH (ref 65–99)
Glucose-Capillary: 97 mg/dL (ref 65–99)
Glucose-Capillary: 177 mg/dL — ABNORMAL HIGH (ref 65–99)

## 2015-07-26 LAB — CBC
HCT: 40 % (ref 36.0–46.0)
HEMOGLOBIN: 13.8 g/dL (ref 12.0–15.0)
MCH: 32.5 pg (ref 26.0–34.0)
MCHC: 34.5 g/dL (ref 30.0–36.0)
MCV: 94.3 fL (ref 78.0–100.0)
PLATELETS: 188 10*3/uL (ref 150–400)
RBC: 4.24 MIL/uL (ref 3.87–5.11)
RDW: 12.9 % (ref 11.5–15.5)
WBC: 8.8 10*3/uL (ref 4.0–10.5)

## 2015-07-26 SURGERY — LUMBAR LAMINECTOMY/DECOMPRESSION MICRODISCECTOMY
Anesthesia: General | Site: Back

## 2015-07-26 MED ORDER — HYDRALAZINE HCL 25 MG PO TABS
25.0000 mg | ORAL_TABLET | Freq: Three times a day (TID) | ORAL | Status: DC
  Administered 2015-07-26 – 2015-07-27 (×2): 25 mg via ORAL
  Filled 2015-07-26 (×2): qty 1

## 2015-07-26 MED ORDER — FUROSEMIDE 40 MG PO TABS
40.0000 mg | ORAL_TABLET | Freq: Two times a day (BID) | ORAL | Status: DC
  Administered 2015-07-26 – 2015-07-27 (×2): 40 mg via ORAL
  Filled 2015-07-26 (×2): qty 1

## 2015-07-26 MED ORDER — DEXAMETHASONE SODIUM PHOSPHATE 4 MG/ML IJ SOLN
INTRAMUSCULAR | Status: AC
  Filled 2015-07-26: qty 1

## 2015-07-26 MED ORDER — POTASSIUM CHLORIDE CRYS ER 10 MEQ PO TBCR
10.0000 meq | EXTENDED_RELEASE_TABLET | Freq: Four times a day (QID) | ORAL | Status: DC
  Administered 2015-07-26 – 2015-07-27 (×4): 10 meq via ORAL
  Filled 2015-07-26 (×4): qty 1

## 2015-07-26 MED ORDER — DIAZEPAM 5 MG PO TABS
10.0000 mg | ORAL_TABLET | Freq: Four times a day (QID) | ORAL | Status: DC | PRN
Start: 2015-07-26 — End: 2015-07-27

## 2015-07-26 MED ORDER — DOCUSATE SODIUM 100 MG PO CAPS
100.0000 mg | ORAL_CAPSULE | Freq: Two times a day (BID) | ORAL | Status: DC
  Administered 2015-07-26 – 2015-07-27 (×2): 100 mg via ORAL
  Filled 2015-07-26 (×2): qty 1

## 2015-07-26 MED ORDER — OXYCODONE HCL 5 MG/5ML PO SOLN: 5.0000 mg | Freq: Once | ORAL | Status: DC | PRN

## 2015-07-26 MED ORDER — FENTANYL CITRATE (PF) 250 MCG/5ML IJ SOLN
INTRAMUSCULAR | Status: AC
  Filled 2015-07-26: qty 5

## 2015-07-26 MED ORDER — LACTATED RINGERS IV SOLN
INTRAVENOUS | Status: DC
  Administered 2015-07-26: 11:00:00 via INTRAVENOUS

## 2015-07-26 MED ORDER — ROCURONIUM BROMIDE 100 MG/10ML IV SOLN
INTRAVENOUS | Status: DC | PRN
  Administered 2015-07-26: 30 mg via INTRAVENOUS
  Administered 2015-07-26 (×2): 10 mg via INTRAVENOUS

## 2015-07-26 MED ORDER — ASPIRIN EC 81 MG PO TBEC
81.0000 mg | DELAYED_RELEASE_TABLET | Freq: Every day | ORAL | Status: DC
  Administered 2015-07-26: 81 mg via ORAL
  Filled 2015-07-26: qty 1

## 2015-07-26 MED ORDER — BUPIVACAINE HCL 0.5 % IJ SOLN
INTRAMUSCULAR | Status: DC | PRN
  Administered 2015-07-26: 35 mL

## 2015-07-26 MED ORDER — 0.9 % SODIUM CHLORIDE (POUR BTL) OPTIME
TOPICAL | Status: DC | PRN
  Administered 2015-07-26: 1000 mL

## 2015-07-26 MED ORDER — SUGAMMADEX SODIUM 500 MG/5ML IV SOLN
INTRAVENOUS | Status: DC | PRN
  Administered 2015-07-26: 200 mg via INTRAVENOUS

## 2015-07-26 MED ORDER — DEXAMETHASONE SODIUM PHOSPHATE 4 MG/ML IJ SOLN
INTRAMUSCULAR | Status: DC | PRN
  Administered 2015-07-26: 4 mg via INTRAVENOUS

## 2015-07-26 MED ORDER — VITAMIN B-12 1000 MCG PO TABS
1000.0000 ug | ORAL_TABLET | Freq: Every day | ORAL | Status: DC
Start: 2015-07-27 — End: 2015-07-27
  Administered 2015-07-27: 1000 ug via ORAL
  Filled 2015-07-26: qty 1

## 2015-07-26 MED ORDER — METOPROLOL TARTRATE 25 MG PO TABS
25.0000 mg | ORAL_TABLET | Freq: Two times a day (BID) | ORAL | Status: DC
  Administered 2015-07-26 – 2015-07-27 (×2): 25 mg via ORAL
  Filled 2015-07-26 (×2): qty 1

## 2015-07-26 MED ORDER — INSULIN ASPART 100 UNIT/ML ~~LOC~~ SOLN
0.0000 [IU] | Freq: Three times a day (TID) | SUBCUTANEOUS | Status: DC
  Administered 2015-07-27 (×2): 2 [IU] via SUBCUTANEOUS

## 2015-07-26 MED ORDER — HYDROMORPHONE HCL 1 MG/ML IJ SOLN
0.2500 mg | INTRAMUSCULAR | Status: DC | PRN
  Administered 2015-07-26: 0.5 mg via INTRAVENOUS

## 2015-07-26 MED ORDER — THROMBIN 20000 UNITS EX SOLR
CUTANEOUS | Status: AC
  Filled 2015-07-26: qty 20000

## 2015-07-26 MED ORDER — PHENOL 1.4 % MT LIQD: 1.0000 | OROMUCOSAL | Status: DC | PRN

## 2015-07-26 MED ORDER — CALCIUM CARB-CHOLECALCIFEROL 1000-800 MG-UNIT PO TABS: ORAL_TABLET | Freq: Two times a day (BID) | ORAL | Status: DC

## 2015-07-26 MED ORDER — DULOXETINE HCL 20 MG PO CPEP
20.0000 mg | ORAL_CAPSULE | Freq: Every day | ORAL | Status: DC
  Administered 2015-07-27: 20 mg via ORAL
  Filled 2015-07-26: qty 1

## 2015-07-26 MED ORDER — OXYCODONE HCL 5 MG PO TABS: 5.0000 mg | ORAL_TABLET | Freq: Once | ORAL | Status: DC | PRN

## 2015-07-26 MED ORDER — CEFAZOLIN SODIUM 1-5 GM-% IV SOLN
1.0000 g | Freq: Three times a day (TID) | INTRAVENOUS | Status: AC
  Administered 2015-07-26 – 2015-07-27 (×2): 1 g via INTRAVENOUS
  Filled 2015-07-26 (×2): qty 50

## 2015-07-26 MED ORDER — LACTATED RINGERS IV SOLN
INTRAVENOUS | Status: DC | PRN
  Administered 2015-07-26 (×2): via INTRAVENOUS

## 2015-07-26 MED ORDER — CEFAZOLIN SODIUM-DEXTROSE 2-3 GM-% IV SOLR
INTRAVENOUS | Status: DC | PRN
  Administered 2015-07-26: 2 g via INTRAVENOUS

## 2015-07-26 MED ORDER — LEVOTHYROXINE SODIUM 112 MCG PO TABS
112.0000 ug | ORAL_TABLET | Freq: Every day | ORAL | Status: DC
Start: 2015-07-27 — End: 2015-07-27
  Administered 2015-07-27: 112 ug via ORAL
  Filled 2015-07-26: qty 1

## 2015-07-26 MED ORDER — MORPHINE SULFATE (PF) 2 MG/ML IV SOLN: 1.0000 mg | INTRAVENOUS | Status: DC | PRN

## 2015-07-26 MED ORDER — POLYETHYLENE GLYCOL 3350 17 G PO PACK: 17.0000 g | PACK | Freq: Every day | ORAL | Status: DC | PRN

## 2015-07-26 MED ORDER — PANTOPRAZOLE SODIUM 40 MG IV SOLR
40.0000 mg | Freq: Every day | INTRAVENOUS | Status: DC
  Administered 2015-07-26: 40 mg via INTRAVENOUS
  Filled 2015-07-26: qty 40

## 2015-07-26 MED ORDER — FENTANYL CITRATE (PF) 100 MCG/2ML IJ SOLN
INTRAMUSCULAR | Status: DC | PRN
  Administered 2015-07-26 (×5): 50 ug via INTRAVENOUS

## 2015-07-26 MED ORDER — ACETAMINOPHEN 325 MG PO TABS: 650.0000 mg | ORAL_TABLET | ORAL | Status: DC | PRN

## 2015-07-26 MED ORDER — THROMBIN 20000 UNITS EX KIT: PACK | CUTANEOUS | Status: DC | PRN

## 2015-07-26 MED ORDER — THROMBIN 20000 UNITS EX SOLR
CUTANEOUS | Status: DC | PRN
  Administered 2015-07-26: 15:00:00 via TOPICAL

## 2015-07-26 MED ORDER — ROCURONIUM BROMIDE 50 MG/5ML IV SOLN
INTRAVENOUS | Status: AC
  Filled 2015-07-26: qty 1

## 2015-07-26 MED ORDER — BENAZEPRIL HCL 20 MG PO TABS
40.0000 mg | ORAL_TABLET | Freq: Every day | ORAL | Status: DC
  Administered 2015-07-27: 40 mg via ORAL
  Filled 2015-07-26: qty 2

## 2015-07-26 MED ORDER — PROMETHAZINE HCL 25 MG/ML IJ SOLN: 6.2500 mg | INTRAMUSCULAR | Status: DC | PRN

## 2015-07-26 MED ORDER — ROPINIROLE HCL 1 MG PO TABS
0.5000 mg | ORAL_TABLET | Freq: Every day | ORAL | Status: DC
  Filled 2015-07-26: qty 1

## 2015-07-26 MED ORDER — HYDROCODONE-ACETAMINOPHEN 5-325 MG PO TABS
1.0000 | ORAL_TABLET | ORAL | Status: DC | PRN
  Administered 2015-07-27: 1 via ORAL
  Filled 2015-07-26: qty 1

## 2015-07-26 MED ORDER — ACETAMINOPHEN 650 MG RE SUPP: 650.0000 mg | RECTAL | Status: DC | PRN

## 2015-07-26 MED ORDER — BUPIVACAINE LIPOSOME 1.3 % IJ SUSP
20.0000 mL | INTRAMUSCULAR | Status: AC
  Administered 2015-07-26: 35 mL
  Filled 2015-07-26: qty 20

## 2015-07-26 MED ORDER — BISACODYL 5 MG PO TBEC
5.0000 mg | DELAYED_RELEASE_TABLET | Freq: Every day | ORAL | Status: DC | PRN
Start: 2015-07-26 — End: 2015-07-27

## 2015-07-26 MED ORDER — CALCIUM CARBONATE-VITAMIN D 500-200 MG-UNIT PO TABS
2.0000 | ORAL_TABLET | Freq: Two times a day (BID) | ORAL | Status: DC
  Administered 2015-07-26 – 2015-07-27 (×2): 2 via ORAL
  Filled 2015-07-26 (×2): qty 2

## 2015-07-26 MED ORDER — PROPOFOL INFUSION 10 MG/ML OPTIME
INTRAVENOUS | Status: DC | PRN
  Administered 2015-07-26: 15 ug/kg/min via INTRAVENOUS

## 2015-07-26 MED ORDER — INSULIN ASPART 100 UNIT/ML ~~LOC~~ SOLN
4.0000 [IU] | Freq: Three times a day (TID) | SUBCUTANEOUS | Status: DC
  Administered 2015-07-27 (×2): 4 [IU] via SUBCUTANEOUS

## 2015-07-26 MED ORDER — SODIUM CHLORIDE 0.9 % IJ SOLN: 3.0000 mL | INTRAMUSCULAR | Status: DC | PRN

## 2015-07-26 MED ORDER — DEXTROSE 5 % IV SOLN
10.0000 mg | INTRAVENOUS | Status: DC | PRN
  Administered 2015-07-26: 25 ug/min via INTRAVENOUS

## 2015-07-26 MED ORDER — LIDOCAINE HCL (CARDIAC) 20 MG/ML IV SOLN
INTRAVENOUS | Status: DC | PRN
  Administered 2015-07-26: 80 mg via INTRAVENOUS

## 2015-07-26 MED ORDER — MENTHOL 3 MG MT LOZG: 1.0000 | LOZENGE | OROMUCOSAL | Status: DC | PRN

## 2015-07-26 MED ORDER — ONDANSETRON HCL 4 MG/2ML IJ SOLN: 4.0000 mg | INTRAMUSCULAR | Status: DC | PRN

## 2015-07-26 MED ORDER — ASPIRIN 81 MG PO TABS: 81.0000 mg | ORAL_TABLET | Freq: Every day | ORAL | Status: DC

## 2015-07-26 MED ORDER — HYDROMORPHONE HCL 1 MG/ML IJ SOLN
INTRAMUSCULAR | Status: AC
  Filled 2015-07-26: qty 1

## 2015-07-26 MED ORDER — ONDANSETRON HCL 4 MG/2ML IJ SOLN
INTRAMUSCULAR | Status: AC
  Filled 2015-07-26: qty 2

## 2015-07-26 MED ORDER — LIDOCAINE HCL (CARDIAC) 20 MG/ML IV SOLN
INTRAVENOUS | Status: AC
  Filled 2015-07-26: qty 5

## 2015-07-26 MED ORDER — ONDANSETRON HCL 4 MG/2ML IJ SOLN
INTRAMUSCULAR | Status: DC | PRN
  Administered 2015-07-26 (×2): 4 mg via INTRAVENOUS

## 2015-07-26 MED ORDER — SODIUM CHLORIDE 0.9 % IV SOLN
INTRAVENOUS | Status: DC
  Administered 2015-07-26 – 2015-07-27 (×2): via INTRAVENOUS

## 2015-07-26 MED ORDER — ALUM & MAG HYDROXIDE-SIMETH 200-200-20 MG/5ML PO SUSP: 30.0000 mL | Freq: Four times a day (QID) | ORAL | Status: DC | PRN

## 2015-07-26 MED ORDER — SODIUM CHLORIDE 0.9 % IJ SOLN
3.0000 mL | Freq: Two times a day (BID) | INTRAMUSCULAR | Status: DC
  Administered 2015-07-26: 3 mL via INTRAVENOUS

## 2015-07-26 MED ORDER — PROPOFOL 10 MG/ML IV BOLUS
INTRAVENOUS | Status: AC
  Filled 2015-07-26: qty 20

## 2015-07-26 MED ORDER — SODIUM CHLORIDE 0.9 % IV SOLN: 250.0000 mL | INTRAVENOUS | Status: DC

## 2015-07-26 MED ORDER — AMLODIPINE BESYLATE 5 MG PO TABS
5.0000 mg | ORAL_TABLET | Freq: Every day | ORAL | Status: DC
  Administered 2015-07-27: 5 mg via ORAL
  Filled 2015-07-26: qty 1

## 2015-07-26 MED ORDER — PROPOFOL 10 MG/ML IV BOLUS
INTRAVENOUS | Status: DC | PRN
  Administered 2015-07-26: 150 mg via INTRAVENOUS

## 2015-07-26 SURGICAL SUPPLY — 53 items
ADH SKN CLS APL DERMABOND .7 (GAUZE/BANDAGES/DRESSINGS) ×1
BUR RND FLUTED 2.5 (BURR) IMPLANT
BUR SABER RD CUTTING 3.0 (BURR) ×2 IMPLANT
BUR SABER RD CUTTING 3.0MM (BURR) ×1
CANISTER SUCTION 2500CC (MISCELLANEOUS) ×3 IMPLANT
CLOSURE STERI-STRIP 1/2X4 (GAUZE/BANDAGES/DRESSINGS) ×1
CLSR STERI-STRIP ANTIMIC 1/2X4 (GAUZE/BANDAGES/DRESSINGS) ×2 IMPLANT
COVER SURGICAL LIGHT HANDLE (MISCELLANEOUS) ×3 IMPLANT
DERMABOND ADVANCED (GAUZE/BANDAGES/DRESSINGS) ×2
DERMABOND ADVANCED .7 DNX12 (GAUZE/BANDAGES/DRESSINGS) ×1 IMPLANT
DRAPE INCISE IOBAN 66X45 STRL (DRAPES) IMPLANT
DRAPE MICROSCOPE LEICA (MISCELLANEOUS) ×3 IMPLANT
DRAPE PROXIMA HALF (DRAPES) IMPLANT
DRAPE SURG 17X23 STRL (DRAPES) ×12 IMPLANT
DRSG MEPILEX BORDER 4X4 (GAUZE/BANDAGES/DRESSINGS) ×3 IMPLANT
DRSG MEPILEX BORDER 4X8 (GAUZE/BANDAGES/DRESSINGS) IMPLANT
DURAPREP 26ML APPLICATOR (WOUND CARE) ×3 IMPLANT
ELECT REM PT RETURN 9FT ADLT (ELECTROSURGICAL) ×3
ELECTRODE REM PT RTRN 9FT ADLT (ELECTROSURGICAL) ×1 IMPLANT
EVACUATOR 1/8 PVC DRAIN (DRAIN) IMPLANT
GLOVE BIOGEL PI IND STRL 8 (GLOVE) ×1 IMPLANT
GLOVE BIOGEL PI INDICATOR 8 (GLOVE) ×2
GLOVE ECLIPSE 9.0 STRL (GLOVE) ×3 IMPLANT
GLOVE ORTHO TXT STRL SZ7.5 (GLOVE) ×3 IMPLANT
GLOVE SURG 8.5 LATEX PF (GLOVE) ×6 IMPLANT
GOWN STRL REUS W/ TWL LRG LVL3 (GOWN DISPOSABLE) ×1 IMPLANT
GOWN STRL REUS W/TWL 2XL LVL3 (GOWN DISPOSABLE) ×6 IMPLANT
GOWN STRL REUS W/TWL LRG LVL3 (GOWN DISPOSABLE) ×2
KIT BASIN OR (CUSTOM PROCEDURE TRAY) ×3 IMPLANT
KIT ROOM TURNOVER OR (KITS) ×3 IMPLANT
NEEDLE SPNL 18GX3.5 QUINCKE PK (NEEDLE) ×6 IMPLANT
NS IRRIG 1000ML POUR BTL (IV SOLUTION) ×3 IMPLANT
PACK LAMINECTOMY ORTHO (CUSTOM PROCEDURE TRAY) ×3 IMPLANT
PAD ARMBOARD 7.5X6 YLW CONV (MISCELLANEOUS) ×9 IMPLANT
PATTIES SURGICAL .5 X.5 (GAUZE/BANDAGES/DRESSINGS) ×3 IMPLANT
PATTIES SURGICAL .75X.75 (GAUZE/BANDAGES/DRESSINGS) IMPLANT
PATTIES SURGICAL 1X1 (DISPOSABLE) IMPLANT
SPONGE LAP 4X18 X RAY DECT (DISPOSABLE) IMPLANT
SPONGE SURGIFOAM ABS GEL 100 (HEMOSTASIS) ×3 IMPLANT
SUT VIC AB 0 CT1 27 (SUTURE)
SUT VIC AB 0 CT1 27XBRD ANBCTR (SUTURE) IMPLANT
SUT VIC AB 1 CT1 27 (SUTURE) ×2
SUT VIC AB 1 CT1 27XBRD ANBCTR (SUTURE) ×1 IMPLANT
SUT VIC AB 2-0 CT1 27 (SUTURE) ×2
SUT VIC AB 2-0 CT1 TAPERPNT 27 (SUTURE) ×1 IMPLANT
SUT VIC AB 3-0 X1 27 (SUTURE) ×6 IMPLANT
SUT VICRYL 0 UR6 27IN ABS (SUTURE) ×3 IMPLANT
SYR 20CC LL (SYRINGE) ×3 IMPLANT
SYR 30ML SLIP (SYRINGE) ×3 IMPLANT
TOWEL OR 17X24 6PK STRL BLUE (TOWEL DISPOSABLE) ×3 IMPLANT
TOWEL OR 17X26 10 PK STRL BLUE (TOWEL DISPOSABLE) ×3 IMPLANT
TRAY FOLEY CATH 16FRSI W/METER (SET/KITS/TRAYS/PACK) IMPLANT
WATER STERILE IRR 1000ML POUR (IV SOLUTION) IMPLANT

## 2015-07-26 NOTE — Anesthesia Postprocedure Evaluation (Signed)
  Anesthesia Post-op Note  Patient: Sierra Wright  Procedure(s) Performed: Procedure(s): BILATERAL Lumbar 3-4 SUBTOTAL HEMILAMINECTOMY WITH LATERAL RECESS DECOMPRESSION (N/A)  Patient Location: PACU  Anesthesia Type:General  Level of Consciousness: awake and alert   Airway and Oxygen Therapy: Patient Spontanous Breathing  Post-op Pain: Controlled  Post-op Assessment: Post-op Vital signs reviewed, Patient's Cardiovascular Status Stable and Respiratory Function Stable  Post-op Vital Signs: Reviewed  Filed Vitals:   07/26/15 1700  BP: 145/50  Pulse: 71  Temp:   Resp: 14    Complications: Pt in a-fib in PACU. Rate is controlled. EKG and labs ordred by Dr. Otelia Sergeant. Hospitalist and Cardiology consulted by Dr. Otelia Sergeant

## 2015-07-26 NOTE — Interval H&P Note (Signed)
History and Physical Interval Note:  07/26/2015 1:14 PM  Sierra Wright  has presented today for surgery, with the diagnosis of central stenosis L3-4, L4 healed compression fracture  The various methods of treatment have been discussed with the patient and family. After consideration of risks, benefits and other options for treatment, the patient has consented to  Procedure(s): BILATERAL L3-4 SUBTOTAL HEMILAMINECTOMY WITH LATERAL RECESS DECOMPRESSION (N/A) as a surgical intervention .  The patient's history has been reviewed, patient examined, no change in status, stable for surgery.  I have reviewed the patient's chart and labs.  Questions were answered to the patient's satisfaction.     NITKA,JAMES E

## 2015-07-26 NOTE — Progress Notes (Signed)
Patient in the PACU, noted by nursing personal to be in atrial fibrillation. I call triad hospitalist and also placed a call to page [place] [facility] group on call. Records show previous 1st degree block, but no atrial fibrillation. VSS Ventricular rate is 69 BP is stable 129/60 Ordered Stat troponin, CBC, BMET, and 12 lead EKG. 12 lead shows HR 69 atrial fibrillation with RBBB.

## 2015-07-26 NOTE — Anesthesia Preprocedure Evaluation (Addendum)
Anesthesia Evaluation  Patient identified by MRN, date of birth, ID band Patient awake    Reviewed: Allergy & Precautions, NPO status , Patient's Chart, lab work & pertinent test results, reviewed documented beta blocker date and time   History of Anesthesia Complications (+) PONV  Airway Mallampati: I  TM Distance: >3 FB Neck ROM: Full    Dental  (+) Lower Dentures, Upper Dentures   Pulmonary  breath sounds clear to auscultation        Cardiovascular hypertension, Pt. on medications and Pt. on home beta blockers + CAD and + CABG + dysrhythmias Rhythm:Regular Rate:Normal     Neuro/Psych Depression Lumbar spinal stenosis with L4 compression fx    GI/Hepatic negative GI ROS, Neg liver ROS,   Endo/Other  diabetes, Type 2, Oral Hypoglycemic AgentsHypothyroidism   Renal/GU negative Renal ROS     Musculoskeletal  (+) Arthritis -,   Abdominal   Peds  Hematology negative hematology ROS (+)   Anesthesia Other Findings   Reproductive/Obstetrics                           Anesthesia Physical Anesthesia Plan  ASA: III  Anesthesia Plan: General   Post-op Pain Management:    Induction: Intravenous  Airway Management Planned: Oral ETT  Additional Equipment:   Intra-op Plan:   Post-operative Plan: Extubation in OR  Informed Consent: I have reviewed the patients History and Physical, chart, labs and discussed the procedure including the risks, benefits and alternatives for the proposed anesthesia with the patient or authorized representative who has indicated his/her understanding and acceptance.   Dental advisory given  Plan Discussed with: CRNA  Anesthesia Plan Comments:         Anesthesia Quick Evaluation

## 2015-07-26 NOTE — Anesthesia Procedure Notes (Signed)
Procedure Name: Intubation Date/Time: 07/26/2015 1:35 PM Performed by: Leonel Ramsay Pre-anesthesia Checklist: Patient identified, Timeout performed, Emergency Drugs available, Suction available and Patient being monitored Patient Re-evaluated:Patient Re-evaluated prior to inductionOxygen Delivery Method: Circle system utilized Preoxygenation: Pre-oxygenation with 100% oxygen Intubation Type: IV induction Ventilation: Mask ventilation without difficulty and Oral airway inserted - appropriate to patient size Laryngoscope Size: Mac and 3 Grade View: Grade I Tube type: Oral Tube size: 7.0 mm Number of attempts: 1 Airway Equipment and Method: Stylet and Oral airway Placement Confirmation: ETT inserted through vocal cords under direct vision,  positive ETCO2 and breath sounds checked- equal and bilateral Secured at: 22 cm Tube secured with: Tape Dental Injury: Teeth and Oropharynx as per pre-operative assessment

## 2015-07-26 NOTE — Brief Op Note (Addendum)
07/26/2015  5:42 PM  PATIENT:  Sierra Wright  81 y.o. female  PRE-OPERATIVE DIAGNOSIS:  central stenosis Lumbar 3-4, Lumbar 4 healed compression fracture  POST-OPERATIVE DIAGNOSIS:  central stenosis Lumbar 3-4, Lumbar 4 healed compression fracture  PROCEDURE:  Procedure(s): BILATERAL Lumbar 3-4 SUBTOTAL HEMILAMINECTOMY WITH LATERAL RECESS DECOMPRESSION (N/A)  SURGEON:  Surgeon(s) and Role:    * Kerrin Champagne, MD - Primary  PHYSICIAN ASSISTANT: Andee Lineman   ANESTHESIA:   local and general, Dr. Marcene Duos.  EBL:  Total I/O In: 1000 [I.V.:1000] Out: 200 [Blood:200]  BLOOD ADMINISTERED:none  DRAINS: none   LOCAL MEDICATIONS USED:  MARCAINE 0.5% 1:1 EXPAREL 1.3% Amount: 20 ml  SPECIMEN:  No Specimen  DISPOSITION OF SPECIMEN:  N/A  COUNTS:  YES  TOURNIQUET:  * No tourniquets in log *  DICTATION: .Dragon Dictation  PLAN OF CARE: Admit for overnight observation  PATIENT DISPOSITION:  PACU - hemodynamically stable.   Delay start of Pharmacological VTE agent (>24hrs) due to surgical blood loss or risk of bleeding: yes

## 2015-07-26 NOTE — Consult Note (Signed)
Patient ID: KEBRINA FRIEND MRN: [id number], DOB/AGE: October 02, 1934   Admit date: 07/26/2015   Primary Physician: Emeterio Reeve, MD Primary Cardiologist: Dr. [name]  Pt. Profile:  81 y/o female with h/o CABG, HTN and DM with new onset afib following back surgery.   Problem List  Past Medical History  Diagnosis Date  . Coronary artery disease 1999    CABG  . Shortness of breath 09/03/12    ECHO- The LA is Moderately Dilated, mild mitral annular calcification. mild pulmonary hypertension, moderate concentric LV hypertrophy. Atrial septum is aneurysmal. Aortic valve appears to be mildly sclerotic.EF 88%  . Hypertension   . PONV (postoperative nausea and vomiting)   . Weakness of both legs   . Restless leg syndrome   . Diabetic neuropathy   . Hypothyroidism     synthroid  . Diabetes mellitus without complication     type II; metformin and Amaryl  . Depression   . Urinary incontinence   . HOH (hard of hearing)   . Arthritis   . History of anemia as a child     Past Surgical History  Procedure Laterality Date  . Cardiac catherization  08/20/03    Widely patent bypass grafts. Normal left ventricular systolic function.  . Colonoscopy    . Eye surgery      bilateral cataracts  . Cholecystectomy    . Appendectomy    . Abdominal hysterectomy    . Back surgery    . Shoulder surgery    . Tubal ligation    . Cardiac catheterization    . Coronary artery bypass graft       Allergies  Allergies  Allergen Reactions  . Statins Other (See Comments)    Causes myalgias  . Amitriptyline Other (See Comments)    Pt. States that it caused it to be suicidal.   . Cena Benton Hcl] Other (See Comments)    Causes dizziness    HPI  81 y/o female, followed by Dr. [name], with a h/o CAD s/p CABG 16 years ago, HTN, DM, hypothyroidism on synthroid, admitted for orthopedic surgery undergoing lumbar 3-4 hemilaminectomy with lateral decompression. Post-op recovery  complicated by new onset atrial fibrillation. Her HR is well controlled in the 60s. She is asymptomatic. BP is stable.   Note, her last 2D echo was in April 2015 which showed normal LV systolic function with an EF of 65-70%. No significant valvular disease. Her CHA2DS2 VASc score is 5 (Age >75, sex, HTN and DM).      Home Medications  Prior to Admission medications   Medication Sig Start Date End Date Taking? Authorizing Provider  acetaminophen-codeine (TYLENOL #3) 300-30 MG per tablet Take 1 tablet by mouth every 6 (six) hours as needed for moderate pain.   Yes Historical Provider, MD  amLODipine (NORVASC) 5 MG tablet TAKE ONE TABLET BY MOUTH ONCE DAILY 07/15/15  Yes Cassell Clement, MD  Ascorbic Acid (VITAMIN C ER PO) Take 1,000 mg by mouth daily.    Yes Historical Provider, MD  aspirin 81 MG tablet Take 81 mg by mouth daily.   Yes Historical Provider, MD  benazepril (LOTENSIN) 40 MG tablet TAKE ONE TABLET BY MOUTH ONCE DAILY 09/09/14  Yes Cassell Clement, MD  Calcium Carb-Cholecalciferol (CALCIUM 1000 + D PO) Take 1,000 mg by mouth 2 (two) times daily.    Yes Historical Provider, MD  diazepam (VALIUM) 10 MG tablet Take 10 mg by mouth every 6 (six) hours as needed for anxiety.  Yes Historical Provider, MD  DULoxetine (CYMBALTA) 20 MG capsule Take 20 mg by mouth daily.   Yes Historical Provider, MD  fish oil-omega-3 fatty acids 1000 MG capsule Take 1-2 g by mouth 3 (three) times daily. 1 capsule morning and evening , 2 capsules at lunch   Yes Historical Provider, MD  furosemide (LASIX) 40 MG tablet TAKE ONE TABLET BY MOUTH TWICE DAILY 03/18/15  Yes Cassell Clement, MD  Garlic 1000 MG CAPS Take by mouth daily.   Yes Historical Provider, MD  glimepiride (AMARYL) 4 MG tablet Take 4 mg by mouth 2 (two) times daily.   Yes Historical Provider, MD  hydrALAZINE (APRESOLINE) 25 MG tablet Take 1 tablet (25 mg total) by mouth 3 (three) times daily. 02/23/15  Yes Cassell Clement, MD  levothyroxine  (SYNTHROID, LEVOTHROID) 112 MCG tablet Take 112 mcg by mouth daily before breakfast.   Yes Historical Provider, MD  metFORMIN (GLUCOPHAGE) 500 MG tablet Take 500-1,000 mg by mouth 2 (two) times daily with a meal. 2 tablets in the morning, and  In the evening   Yes Historical Provider, MD  metoprolol tartrate (LOPRESSOR) 25 MG tablet TAKE ONE TABLET BY MOUTH TWICE DAILY. 02/18/15  Yes Cassell Clement, MD  potassium chloride (K-DUR,KLOR-CON) 10 MEQ tablet Take 10 mEq by mouth 4 (four) times daily. 03/15/14  Yes Cassell Clement, MD  rOPINIRole (REQUIP) 0.5 MG tablet Take 0.5 mg by mouth daily.   Yes Historical Provider, MD  vitamin B-12 (CYANOCOBALAMIN) 1000 MCG tablet Take 1,000 mcg by mouth daily.   Yes Historical Provider, MD    Family History  Family History  Problem Relation Age of Onset  . Other Mother   . Heart attack Father     Social History  Social History   Social History  . Marital Status: Married    Spouse Name: N/A  . Number of Children: N/A  . Years of Education: N/A   Occupational History  . Not on file.   Social History Main Topics  . Smoking status: Never Smoker   . Smokeless tobacco: Not on file  . Alcohol Use: No  . Drug Use: No  . Sexual Activity: Not on file   Other Topics Concern  . Not on file   Social History Narrative     Review of Systems General:  No chills, fever, night sweats or weight changes.  Cardiovascular:  No chest pain, dyspnea on exertion, edema, orthopnea, palpitations, paroxysmal nocturnal dyspnea. Dermatological: No rash, lesions/masses Respiratory: No cough, dyspnea Urologic: No hematuria, dysuria Abdominal:   No nausea, vomiting, diarrhea, bright red blood per rectum, melena, or hematemesis Neurologic:  No visual changes, wkns, changes in mental status. All other systems reviewed and are otherwise negative except as noted above.  Physical Exam  Blood pressure 145/50, pulse 71, temperature 96.9 F (36.1 C), resp. rate 14,  weight 207 lb 12.8 oz (94.257 kg), SpO2 98 %.  General: Pleasant, NAD Psych: Normal affect. Neuro: Alert and oriented X 3. Moves all extremities spontaneously. HEENT: Normal  Neck: no JVD. Lungs:  Resp regular and unlabored, CTA anteriorlly. Heart: irregular rhythm, normal rate no s3, s4, or murmurs. Abdomen: Soft, non-tender, non-distended, BS + x 4.  Extremities: No clubbing, cyanosis or edema. DP/PT/Radials 2+ and equal bilaterally.  Labs  Troponin (Point of Care Test) No results for input(s): TROPIPOC in the last 72 hours. No results for input(s): CKTOTAL, CKMB, TROPONINI in the last 72 hours. Lab Results  Component Value Date   WBC 8.8  Aug 09, 2015   HGB 13.8 09-Aug-2015   HCT 40.0 August 09, 2015   MCV 94.3 08-09-2015   PLT 188 09-Aug-2015    Recent Labs Lab Aug 09, 2015 1035  NA 138  K 3.4*  CL 104  CO2 23  BUN 10  CREATININE 0.87  CALCIUM 9.2  GLUCOSE 153*   No results found for: CHOL, HDL, LDLCALC, TRIG No results found for: DDIMER   Radiology/Studies  Dg Lumbar Spine 1 View  2015-08-09   CLINICAL DATA:  L3-4 laminectomy.  EXAM: LUMBAR SPINE - 1 VIEW  COMPARISON:  MRI 04/27/2015  FINDINGS: Vertebral body alignment is within normal. There is moderate spondylosis of the lumbar spine to include facet arthropathy. There is disc space narrowing at the L5-S1 level. There is a mild compression fracture of L4 unchanged. Remainder of the exam is unchanged. A surgical instrument is present over the region of the L4 spinous process.  IMPRESSION: Surgical instrument present over the region of the L4 spinous process.  Stable mild L4 compression fracture.  Moderate spondylosis of the lumbar spine with disc disease at the L5-S1 level.   Electronically Signed   By: Elberta Fortis M.D.   On: 2015-08-09 16:30   Dg Bone Density  07/07/2015   CLINICAL DATA:  Postmenopausal. ESTROGEN DEFICIENCY.  EXAM: DUAL X-RAY ABSORPTIOMETRY (DXA) FOR BONE MINERAL DENSITY  FINDINGS: LEFT FOREARM DISTAL 1/3  Bone  Mineral Density (BMD):  0.513 g/cm2  Young Adult T-Score:  -3.0  Z-Score:  0.0  LEFT FEMUR NECK  Bone Mineral Density (BMD):  0.565 g/cm2  Young Adult T-Score: -2.6  Z-Score:  -0.3  ASSESSMENT: Patient's diagnostic category is OSTEOPOROSIS by WHO Criteria.  FRACTURE RISK: INCREASED  FRAX: World Health Organization FRAX assessment of absolute fracture risk is not calculated for this patient because the patient has OSTEOPOROSIS.  COMPARISON: BONE MINERAL DENSITY OF THE LEFT FEMUR HAS DECREASED BY 3.8% COMPARED TO THE BASELINE STUDY OF 2003. THIS IS STATISTICALLY SIGNIFICANT. FOREARM HAS NEVER BEEN IMAGED IN THE PAST. IT IS NOTED THAT LUMBAR SPINE IMAGING WAS NOT PERFORMED TODAY, AS THE PATIENT IS SCHEDULED FOR BACK SURGERY LATER THIS MONTH, AND THEREFORE FOLLOW-UP IMAGING WHITE THE SPECIFIC FULL THE LUMBAR SPINE. FOREARM IMAGES WERE OBTAINED TODAY FOR THIS REASON.  Effective therapies are available in the form of bisphosphonates, selective estrogen receptor modulators, biologic agents, and hormone replacement therapy (for women). All patients should ensure an adequate intake of dietary calcium (1200 mg daily) and vitamin D (800 IU daily) unless contraindicated.  All treatment decisions require clinical judgment and consideration of individual patient factors, including patient preferences, co-morbidities, previous drug use, risk factors not captured in the FRAX model (e.g., frailty, falls, vitamin D deficiency, increased bone turnover, interval significant decline in bone density) and possible under- or over-estimation of fracture risk by FRAX.  The National Osteoporosis Foundation recommends that FDA-approved medical therapies be considered in postmenopausal women and men age 35 or older with a:  1. Hip or vertebral (clinical or morphometric) fracture.  2. T-score of -2.5 or lower at the spine or hip.  3. Ten-year fracture probability by FRAX of 3% or greater for hip fracture or 20% or greater for major osteoporotic  fracture.  People with diagnosed cases of osteoporosis or at high risk for fracture should have regular bone mineral density tests. For patients eligible for Medicare, routine testing is allowed once every 2 years. The testing frequency can be increased to one year for patients who have rapidly progressing disease, those who are receiving  or discontinuing medical therapy to restore bone mass, or have additional risk factors.  World Science writer [facility]) Criteria:  Normal: T-scores from +1.0 to -1.0  Low Bone Mass (Osteopenia): T-scores between -1.0 and -2.5  Osteoporosis: T-scores -2.5 and below  Comparison to Reference Population:  T-score is the key measure used in the diagnosis of osteoporosis and relative risk determination for fracture. It provides a value for bone mass relative to the mean bone mass of a young adult reference population expressed in terms of standard deviation (SD).  Z-score is the age-matched score showing the patient's values compared to a population matched for age, sex, and race. This is also expressed in terms of standard deviation. The patient may have values that compare favorably to the age-matched values and still be at increased risk for fracture.   Electronically Signed   By: Britta Mccreedy M.D.   On: 07/07/2015 12:12    ECG  Atrial fibrillation with controlled ventricular response.     ASSESSMENT AND PLAN  1. New Onset Atrial Fibrillation: rate is well controlled and she is asymptomatic with stable BP. Continue on telemetry to monitor rhythm and rate. Given an elevated CHA2DS2 VASc score of 5, she will need long term oral anticoagulation for stroke prophylaxis. However, will delay initiation given recent back surgery. We will follow along and will plan to start low dose Eliquis once cleared by ortho.    SignedRobbie Lis, PA-C 07/26/2015, 5:48 PM   Attending Note:   The patient was seen and examined.  Agree with assessment and plan as noted above.   Changes made to the above note as needed.  Ms. melder is a 81 yo who was admitted for back surgery today . She was noted to have atrial fib during / after the case.  She denies any chest pain or dyspnea. She is hemodynamically stable.   At this point, her HR is very well controlled and I dont think she needs any rate controlling agents. Will anticipate starting Eliquis or Xarelto in the next day or so - depending on Dr. Barbaraann Faster assessment in the am. i think that we could safely  wait several days to start anticoagulation if needed.  Her echo from last year shows normal LV function .   2. CAD - s/p CABG.  A troponin has been ordered.  Currently , she has not signs or symptoms of ischemia and I doubt that this is due to ischemia /infarction    3. Essential Hypertension:   Will follow along with you .   Vesta Mixer, Montez Hageman., MD, [facility] 07/26/2015, 6:13 PM [address],  [address] Office [phone] Pager [phone]

## 2015-07-26 NOTE — Transfer of Care (Signed)
Immediate Anesthesia Transfer of Care Note  Patient: Sierra Wright  Procedure(s) Performed: Procedure(s): BILATERAL Lumbar 3-4 SUBTOTAL HEMILAMINECTOMY WITH LATERAL RECESS DECOMPRESSION (N/A)  Patient Location: PACU  Anesthesia Type:General  Level of Consciousness: awake and alert   Airway & Oxygen Therapy: Patient Spontanous Breathing and Patient connected to face mask oxygen  Post-op Assessment: Report given to RN and Post -op Vital signs reviewed and stable  Post vital signs: Reviewed and stable  Last Vitals:  Filed Vitals:   07/26/15 1624  BP:   Pulse:   Temp: 36.1 C  Resp:     Complications: No apparent anesthesia complications

## 2015-07-26 NOTE — H&P (Signed)
Sierra Wright is an 81 y.o. female.    This patient returns today for followup of her back, shoulder and legs.  She had an epidural steroid injection done on 06/02/2015.  The injection really did not help very much.  She states that she is using a walker to ambulate sometimes.  She is 81 years of age.  She also gets around in a wheelchair.  She also walks behind a wheelchair and walks sitting in a wheelchair.  She has difficulty standing by herself.  The pain seems to come and go.  She has difficulty sleeping at night time.  It has been a year since she was last able to stand by herself and use a cane.  She now has been using a walker.  The distance with a walker has been decreasing.  The pain has been worsening with weakness in her legs.  She says that her legs sometime feel like they are jumping.  She notices numbness and paresthesias into her lower extremities and feet.  She has no bowel or bladder difficulties.  She relates that the pain worsens when she tries to stand and walk.  She notices weakness when she tries to stand and walk.        Past Medical History  Diagnosis Date  . Coronary artery disease 1999    CABG  . Shortness of breath 09/03/12    ECHO- The LA is Moderately Dilated, mild mitral annular calcification. mild pulmonary hypertension, moderate concentric LV hypertrophy. Atrial septum is aneurysmal. Aortic valve appears to be mildly sclerotic.EF 88%  . Hypertension   . PONV (postoperative nausea and vomiting)   . Weakness of both legs   . Restless leg syndrome   . Diabetic neuropathy   . Hypothyroidism     synthroid  . Diabetes mellitus without complication     type II; metformin and Amaryl  . Depression   . Urinary incontinence   . HOH (hard of hearing)   . Arthritis   . History of anemia as a child     Past Surgical History  Procedure Laterality Date  . Cardiac catherization  08/20/03    Widely patent bypass grafts. Normal left ventricular systolic function.  .  Colonoscopy    . Eye surgery      bilateral cataracts  . Cholecystectomy    . Appendectomy    . Abdominal hysterectomy    . Back surgery    . Shoulder surgery    . Tubal ligation    . Cardiac catheterization    . Coronary artery bypass graft      Family History  Problem Relation Age of Onset  . Other Mother   . Heart attack Father    Social History:  reports that she has never smoked. She does not have any smokeless tobacco history on file. She reports that she does not drink alcohol or use illicit drugs.  Allergies:  Allergies  Allergen Reactions  . Statins Other (See Comments)    Causes myalgias  . Amitriptyline Other (See Comments)    Pt. States that it caused it to be suicidal.   . Roanna Banning Hcl] Other (See Comments)    Causes dizziness    Medications Prior to Admission  Medication Sig Dispense Refill  . acetaminophen-codeine (TYLENOL #3) 300-30 MG per tablet Take 1 tablet by mouth every 6 (six) hours as needed for moderate pain.    Marland Kitchen amLODipine (NORVASC) 5 MG tablet TAKE ONE TABLET BY MOUTH ONCE  DAILY 30 tablet 0  . Ascorbic Acid (VITAMIN C ER PO) Take 1,000 mg by mouth daily.     Marland Kitchen aspirin 81 MG tablet Take 81 mg by mouth daily.    . benazepril (LOTENSIN) 40 MG tablet TAKE ONE TABLET BY MOUTH ONCE DAILY 90 tablet 3  . Calcium Carb-Cholecalciferol (CALCIUM 1000 + D PO) Take 1,000 mg by mouth 2 (two) times daily.     . diazepam (VALIUM) 10 MG tablet Take 10 mg by mouth every 6 (six) hours as needed for anxiety.    . DULoxetine (CYMBALTA) 20 MG capsule Take 20 mg by mouth daily.    . fish oil-omega-3 fatty acids 1000 MG capsule Take 1-2 g by mouth 3 (three) times daily. 1 capsule morning and evening , 2 capsules at lunch    . furosemide (LASIX) 40 MG tablet TAKE ONE TABLET BY MOUTH TWICE DAILY 60 tablet 3  . Garlic 7829 MG CAPS Take by mouth daily.    Marland Kitchen glimepiride (AMARYL) 4 MG tablet Take 4 mg by mouth 2 (two) times daily.    . hydrALAZINE (APRESOLINE) 25  MG tablet Take 1 tablet (25 mg total) by mouth 3 (three) times daily. 90 tablet 5  . levothyroxine (SYNTHROID, LEVOTHROID) 112 MCG tablet Take 112 mcg by mouth daily before breakfast.    . metFORMIN (GLUCOPHAGE) 500 MG tablet Take 500-1,000 mg by mouth 2 (two) times daily with a meal. 2 tablets in the morning, and  In the evening    . metoprolol tartrate (LOPRESSOR) 25 MG tablet TAKE ONE TABLET BY MOUTH TWICE DAILY. 60 tablet 6  . potassium chloride (K-DUR,KLOR-CON) 10 MEQ tablet Take 10 mEq by mouth 4 (four) times daily.    Marland Kitchen rOPINIRole (REQUIP) 0.5 MG tablet Take 0.5 mg by mouth daily.    . vitamin B-12 (CYANOCOBALAMIN) 1000 MCG tablet Take 1,000 mcg by mouth daily.      Results for orders placed or performed during the hospital encounter of 07/26/15 (from the past 48 hour(s))  CBC     Status: None   Collection Time: 07/26/15 10:35 AM  Result Value Ref Range   WBC 8.8 4.0 - 10.5 K/uL   RBC 4.24 3.87 - 5.11 MIL/uL   Hemoglobin 13.8 12.0 - 15.0 g/dL   HCT 40.0 36.0 - 46.0 %   MCV 94.3 78.0 - 100.0 fL   MCH 32.5 26.0 - 34.0 pg   MCHC 34.5 30.0 - 36.0 g/dL   RDW 12.9 11.5 - 15.5 %   Platelets 188 150 - 400 K/uL  Basic metabolic panel     Status: Abnormal   Collection Time: 07/26/15 10:35 AM  Result Value Ref Range   Sodium 138 135 - 145 mmol/L   Potassium 3.4 (L) 3.5 - 5.1 mmol/L   Chloride 104 101 - 111 mmol/L   CO2 23 22 - 32 mmol/L   Glucose, Bld 153 (H) 65 - 99 mg/dL   BUN 10 6 - 20 mg/dL   Creatinine, Ser 0.87 0.44 - 1.00 mg/dL   Calcium 9.2 8.9 - 10.3 mg/dL   GFR calc non Af Amer >60 >60 mL/min   GFR calc Af Amer >60 >60 mL/min    Comment: (NOTE) The eGFR has been calculated using the CKD EPI equation. This calculation has not been validated in all clinical situations. eGFR's persistently <60 mL/min signify possible Chronic Kidney Disease.    Anion gap 11 5 - 15  Glucose, capillary     Status: Abnormal  Collection Time: 07/26/15 10:35 AM  Result Value Ref Range    Glucose-Capillary 150 (H) 65 - 99 mg/dL   No results found.  Review of Systems  Constitutional: Negative.   Respiratory: Negative.   Cardiovascular: Negative.   Gastrointestinal: Negative.   Musculoskeletal: Positive for back pain.    Blood pressure 191/84, pulse 65, temperature 97.6 F (36.4 C), resp. rate 18, weight 94.257 kg (207 lb 12.8 oz), SpO2 97 %. Physical Exam  Constitutional: No distress.  HENT:  Head: Normocephalic.  Eyes: EOM are normal. Pupils are equal, round, and reactive to light.  Respiratory: No respiratory distress.  GI: She exhibits no distension.    PHYSICAL EXAMINATION:  Shows that she is weak in knee extension.  This is 4/5 on the right and 4+/5 on the left.  Her foot dorsiflexion strength is also weak on the right side at 4+/5 and on the left at -5/5.  She has good plantar flexion strength bilaterally.  Her abduction and adduction is normal.  Abduction strength is weak on both side.  She has hip flexion strength that is normal bilaterally.    RADIOGRAPHS/TESTS:  Her MRI scan has shown a distinct difference between the last one done compared with the previous MRI study.  She has had a fracture in the interval that is involving the superior endplate at L4.  It appears chronic.  There is no marrow edema.  This fracture has occurred in the interval compared with the study from October 2014.  There is an associated disk herniation with severe lumbar spinal stenosis at L3-4 immediately at the level of the fracture site.  There is diffuse disk bulging, facet and ligamentous hypertrophy and encroachment in the subarticular and foraminal areas.  At L4-5 she has had a laminectomy on the right and a mild anterior slip.  The foramen are narrowed bilaterally.  The most important change that seems to be associated with her increasing weakness in the legs and her difficulty with standing and walking is the severe stenosis at L3-4 associated with a compression fracture of the  superior endplate of L4.     ASSESSMENT:  This patient has a disk protrusion with lumbar spinal stenosis at L3-4 at the level of a fracture of the superior endplate of L4.  The fracture has healed.  There is no sign of subluxation at this level.  She has had an anterolisthesis at L4-5 and a diskectomy at that segment.  That appears to be chronic.  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  I believe that her weakness is more likely related to the new findings at L3-4.       PLAN:  I discussed the findings with Lorra.  I have indicated to her that my concern is that she has definite weakness in her legs as she has found it difficult to stand and to ambulate.  This is not getting any better and seemingly is worsening as time goes by.  I think that the time to react is now.  She is 81 years of age.  A surgical procedure would be a decompression at the L3-4 level bilaterally with lateral recess decompression and decompression of the central canal.  Try and maintain as much of the posterior elements as possible to try and prevent a tendency to kyphosis.  I do not think that she is showing improvement in her overall strength in standing and walking even though she does feel somewhat better in terms of her pain complaints.  Her weakness and in general the problems of neurogenic claudication with standing and ambulation persist to the point where she is using a wheelchair and is using a walker for short distances and she is unable to walk even distances of 100 feet without having to find a place to sit.   I think that she should consider undergoing a decompression at the L3-4 level for severe lumbar spinal stenosis in order to try and improve her overall strength, her standing and walking tolerance.  I think that considering that she has weakness in both legs and she  has had a previous condition in the lower thoracic spine that is stable, the worsening condition is at L3-4 and this should be addressed in order to improve her standing and walking tolerance and the strength in her legs.  I will go ahead and write orders to consider surgical intervention.  We will need to seek clearance from her primary care doctor in regards to medical concerns and also from her cardiologist, Dr. Mare Ferrari.  The risks of surgery including the risks of infection, bleeding and neurologic compromise were discussed with Judson Roch.  She wishes to go ahead and proceed but she does express some concern about her situation.  I have recommended, though, that we seek medical clearance before scheduling her for any surgical intervention.   OWENS,JAMES M 07/26/2015, 11:53 AM

## 2015-07-26 NOTE — Op Note (Addendum)
07/26/2015  4:40 PM  PATIENT:  Sierra Wright  81 y.o. female  MRN: [id number]  OPERATIVE REPORT  PRE-OPERATIVE DIAGNOSIS:  central stenosis Lumbar 3-4, Lumbar 4 healed compression fracture  POST-OPERATIVE DIAGNOSIS:  central stenosis Lumbar 3-4, Lumbar 4 healed compression fracture  PROCEDURE:  Procedure(s): BILATERAL Lumbar 3-4 SUBTOTAL HEMILAMINECTOMY WITH LATERAL RECESS DECOMPRESSION    SURGEON:  Jessy Oto, MD     ASSISTANT:  Benjiman Core, PA-C  (Present throughout the entire procedure and necessary for completion of procedure in a timely manner)     ANESTHESIA:  General,supplemented with local marcaine 0.5% 1:1 exparel 1.3% total 20cc, Dr.Robert Fitzgerald.    COMPLICATIONS:  None.   EBL: 100 cc  DRAINS: none.    PROCEDURE:The patient was met in the holding area, and the appropriate Right Lumbar level L3-4 identified and marked with "x" and my initials.The patient was then transported to OR and was placed under general anesthesia without difficulty. The patient received appropriate preoperative antibiotic prophylaxis ancef 2 grams. The patient after intubation atraumatically was transferred to the operating room table, prone position, Wilson frame, sliding OR table. All pressure points were well padded. The left arm in 90-90 well-padded at the elbow, the right arm tucked at her side due to right total shoulder arthroplasty. Standard prep with DuraPrep solution lower dorsal spine to the mid sacral segment. Draped in the usual manner iodine Vi-Drape was draped sterilely to the field and used to identify the spinal needles positions. The clamp was at the upper aspect of the spinous process of L4 Skin superior to this was then infiltrated with local marcaine 0.5% 1:1 exparel 1.3%, 30 cc used. An incision approximately an inch inch and a half in length was then made through skin and subcutaneous layers in line with the the expected midline just superior to the spinal needle entry  point. An incision made into the bilateral lumbosacral fascia approximately an inch in length .  Cobb elevator was then introduced into the incision site and used to carefully pregform subperiosteal movement of the hip paralumbar muscles off of the posterior lamina of the expected L3-4 level bilaterally.  The depth measured off of the Cobb elevators at about 50 mm.  50 mm Boss McCoullough retractors and placed on the scaffolding for thd L3-4 level. The operating room microscope sterilely draped brought into the field. Under the operating room microscope, the left L3-4 interspace carefully debrided the small amount of muscle attachment here and high-speed bur used to drill the medial aspect of the inferior articular process of L3 approximately 10%. Foraminotomy was then performed over the L4 nerve root. The medial 10% superior articular process of L4 and then resected using an osteotome and 2 mm Kerrison. This allowed for identification of the thecal sac. Penfield 4 was then used to carefully mobilize the thecal sac medially and the L5 nerve root identified within the lateral recess flattened over the posterior aspect of the L3-4 disc. Carefully the lateral aspect of the L4 nerve root was identified and a Penfield 4 was used to mobilize the nerve medially such that the L3-4 disc was visible with microscope. Using a Penfield 4 for retraction the L3-4 lateral thecal sacretracted using a Derricho retractor. Further foraminotomies was performed over the L4 nerve root the nerve root was noted to be decompressed. The nerve root able to be retracted along the medial aspect of the L4 pedicle and disc found to be prominent but not rupturec at this level.  Ligamentum  flavum was further debrided superiorly to the insertion site of the flavum into the ventral aspect of the L3 hemilamina. A small amount of further resection of the left L4 lamina inferiorly was performed. Attention then turned to the right L3-4 level which was  easily visualized with the microscope. Soft tissues debrided about the posterior aspect of the L3-4 interspace. High-speed bur and then used to carefully drill inferior 3 or 4 mm of the right side L3 lamina and on the medial aspect of the right L3 inferior articular process of 3 mm. The superior margin of the L4 lamina then carefully debrided with curette and a 2 mm kerrison then used to enter the spinal canal over the superior aspect of the L4 lamina resecting bone over the superior aspect and freeing up the attachment of ligamentum flavum here. Ligamentum flavum then debrided with the 2 mm and 3 mm Kerrisons we decompressed the L4 nerve root and the lateral recess right L3-4 decompressed using 2 and 3 mm Kerrisons sizing hypertrophic reflected ligamentum flavum extending superiorly. From was resected off the ventral aspect of the inferior margin of the L3 lamina. Hockey-stick and Woodson nerve probe could then be passed out the L3 neuroforamen and the L4 neuroforamen. Venous bleeding encountered. Thrombin-soaked Gelfoam used to control this following this then the sac and the L4 nerve root were mobilized medially and the L3-4 disc examined and found not to be herniated. The disc was prominent due to retropulse bone from the superior endplate of  L4.  Irrigation was carried out down to this bleeding controlled with Gelfoam. Gelfoam was then removed. Irrigation carried careful examination demonstrated no active bleeding present. Retractors were then carefully removed Since carefully then the Bleeding was then controlled using thrombin-soaked Gelfoam small cottonoids. Small amount of bleeding within the soft tissue the laminotomy area was controlled using bipolar electrocautery. Irrigation was carried out using copious amounts of irrigant solution. All Gelfoam were then removed. No significant active bleeding present at the time of removal. All instruments sponge counts were correct traction system was then carefully  removed carefully rotating retractors with this withdrawal and only bipolar electrocautery of any small bleeders. Lumbodorsal fascia was then carefully approximated with interrupted 0 Vicryl sutures, UR 6 needle deep subcutaneous layers were approximated with interrupted 0 Vicryl sutures on UR 6 the appear subcutaneous layers approximated with interrupted 2-0 Vicryl sutures and the skin closed with a running subcutaneous stitch of 4-0 Vicryl. Dermabond was applied allowed to dry and then Mepilex bandage applied. Patient was then carefully returned to supine position on a stretcher, reactivated and extubated. He was then returned to recovery room in satisfactory condition.  Benjiman Core, PA-C perform the duties of assistant surgeon during this case. he was present from the beginning of the case to the end of the case assisting in transfer the patient from his stretcher to the OR table and back to the stretcher at the end of the case. Assisted in careful retraction and suction of the laminectomy site delicate neural structures operating under the operating room microscope. He performed closure of the incision from the fascia to the skin applying the dressing.     Lorain Keast E  07/26/2015, 4:40 PM

## 2015-07-27 ENCOUNTER — Encounter (HOSPITAL_COMMUNITY): Payer: Self-pay | Admitting: Specialist

## 2015-07-27 DIAGNOSIS — I480 Paroxysmal atrial fibrillation: Secondary | ICD-10-CM | POA: Diagnosis not present

## 2015-07-27 DIAGNOSIS — M4806 Spinal stenosis, lumbar region: Secondary | ICD-10-CM | POA: Diagnosis not present

## 2015-07-27 DIAGNOSIS — M810 Age-related osteoporosis without current pathological fracture: Secondary | ICD-10-CM | POA: Diagnosis present

## 2015-07-27 LAB — BASIC METABOLIC PANEL
ANION GAP: 10 (ref 5–15)
BUN: 10 mg/dL (ref 6–20)
CALCIUM: 8.5 mg/dL — AB (ref 8.9–10.3)
CO2: 26 mmol/L (ref 22–32)
Chloride: 98 mmol/L — ABNORMAL LOW (ref 101–111)
Creatinine, Ser: 0.93 mg/dL (ref 0.44–1.00)
GFR, EST NON AFRICAN AMERICAN: 57 mL/min — AB (ref >60)
GLUCOSE: 144 mg/dL — AB (ref 65–99)
POTASSIUM: 3.5 mmol/L (ref 3.5–5.1)
Sodium: 134 mmol/L — ABNORMAL LOW (ref 135–145)

## 2015-07-27 LAB — CBC
HEMATOCRIT: 34.2 % — AB (ref 36.0–46.0)
Hemoglobin: 11.6 g/dL — ABNORMAL LOW (ref 12.0–15.0)
MCH: 31.5 pg (ref 26.0–34.0)
MCHC: 33.9 g/dL (ref 30.0–36.0)
MCV: 92.9 fL (ref 78.0–100.0)
PLATELETS: 176 10*3/uL (ref 150–400)
RBC: 3.68 MIL/uL — AB (ref 3.87–5.11)
RDW: 13 % (ref 11.5–15.5)
WBC: 10.5 10*3/uL (ref 4.0–10.5)

## 2015-07-27 LAB — GLUCOSE, CAPILLARY
Glucose-Capillary: 146 mg/dL — ABNORMAL HIGH (ref 65–99)
Glucose-Capillary: 147 mg/dL — ABNORMAL HIGH (ref 65–99)

## 2015-07-27 LAB — HEMOGLOBIN A1C
Hgb A1c MFr Bld: 6.9 % — ABNORMAL HIGH (ref 4.8–5.6)
MEAN PLASMA GLUCOSE: 151 mg/dL

## 2015-07-27 LAB — TROPONIN T: Troponin T TROPT: <0.011 ng/mL (ref <0.011)

## 2015-07-27 MED ORDER — HYDROCODONE-ACETAMINOPHEN 5-325 MG PO TABS: 1.0000 | ORAL_TABLET | ORAL | Status: DC | PRN

## 2015-07-27 MED ORDER — DIAZEPAM 10 MG PO TABS: 10.0000 mg | ORAL_TABLET | Freq: Four times a day (QID) | ORAL | Status: DC | PRN

## 2015-07-27 MED ORDER — IBANDRONATE SODIUM 150 MG PO TABS: 150.0000 mg | ORAL_TABLET | ORAL | Status: DC

## 2015-07-27 MED ORDER — DOCUSATE SODIUM 100 MG PO CAPS: 100.0000 mg | ORAL_CAPSULE | Freq: Two times a day (BID) | ORAL | Status: DC

## 2015-07-27 MED ORDER — PANTOPRAZOLE SODIUM 40 MG PO TBEC: 40.0000 mg | DELAYED_RELEASE_TABLET | Freq: Every day | ORAL | Status: DC

## 2015-07-27 NOTE — Discharge Summary (Signed)
Physician Discharge Summary      Patient ID: Sierra Wright MRN: [id number] DOB/AGE: 05-04-1934 81 y.o.  Admit date: 07/26/2015 Discharge date:07/27/2015   Admission Diagnoses:  Principal Problem:   Spinal stenosis, lumbar region, with neurogenic claudication Active Problems:   Osteoporosis   Spondylolisthesis of lumbar region   Discharge Diagnoses:  Same  Past Medical History  Diagnosis Date  . Coronary artery disease 1999    CABG  . Shortness of breath 09/03/12    ECHO- The LA is Moderately Dilated, mild mitral annular calcification. mild pulmonary hypertension, moderate concentric LV hypertrophy. Atrial septum is aneurysmal. Aortic valve appears to be mildly sclerotic.EF 88%  . Hypertension   . PONV (postoperative nausea and vomiting)   . Weakness of both legs   . Restless leg syndrome   . Diabetic neuropathy   . Hypothyroidism     synthroid  . Diabetes mellitus without complication     type II; metformin and Amaryl  . Depression   . Urinary incontinence   . HOH (hard of hearing)   . Arthritis   . History of anemia as a child     Surgeries: Procedure(s): BILATERAL Lumbar 3-4 SUBTOTAL HEMILAMINECTOMY WITH LATERAL RECESS DECOMPRESSION on 07/26/2015   Consultants:  Dr. Melburn Popper, [facility].  Discharged Condition: Improved  Hospital Course: Sierra Wright is an 81 y.o. female who was admitted 07/26/2015 with a chief complaint of No chief complaint on file. , and found to have a diagnosis of Spinal stenosis, lumbar region, with neurogenic claudication.  She was brought to the operating room on 07/26/2015 and underwent the above named procedures.    She was given perioperative antibiotics:  Anti-infectives    Start     Dose/Rate Route Frequency Ordered Stop   07/26/15 2200  ceFAZolin (ANCEF) IVPB 1 g/50 mL premix     1 g 100 mL/hr over 30 Minutes Intravenous Every 8 hours 07/26/15 1846 07/27/15 0639    In the PACU she was noted to have an  irregular heart rate and rythym EKG showed Atrial Fibrillation with controlled Heart Rate of 60-70s. A cardiology consult requested with [facility] assistance for new onset of A. Fib.. Her pre op  EKG with 1st degree block with RBBB. Seen by Dr. Melburn Popper, and felt to be stable. Troponin level was negative for acute problem. Placed on telemetry with plan to anti coagulate at 24 hours following her spine surgery. Her vital signs remained stable post operatively, her heart rythym returned to normal sinus after several hours and remained normal sinus for the remainder of her hospital stay. ON POD#1 she was awake, alert and oriented x 4 Her lower extremity pain was resolved, and lower extremity strength was normal she remained in NSR. Incision was dry without drainage or erythrema. She was taking and tolerating Po narcotics of hydrocodone and tolerating a low Na, low carbohydrate diet. She was voiding her normal pre operative incontinence of  Bladder but not bowel. Seen by PT and OT and evaluated. Cardiology saw and recommended against anticoagulation as she had spontaneously Converted to NSR and her heart rate remained in the 60s-70s. She was felt to be stable and should follow up with her cardiologist Dr. [name] in The next several months. She was discharged home on POD#1, taking Baby aspirin 81 mg daily.   She was given sequential compression devices and early ambulation for DVT prophylaxis.  She benefited maximally from their hospital stay and there were no complications.  Recent vital signs:  Filed Vitals:   07/27/15 1454  BP: 156/52  Pulse: 65  Temp: 98.9 F (37.2 C)  Resp: 18    Recent laboratory studies:  Results for orders placed or performed during the hospital encounter of 07/26/15  CBC  Result Value Ref Range   WBC 8.8 4.0 - 10.5 K/uL   RBC 4.24 3.87 - 5.11 MIL/uL   Hemoglobin 13.8 12.0 - 15.0 g/dL   HCT 40.9 81.1 - 91.4 %   MCV 94.3 78.0 - 100.0 fL   MCH 32.5  26.0 - 34.0 pg   MCHC 34.5 30.0 - 36.0 g/dL   RDW 78.2 95.6 - 21.3 %   Platelets 188 150 - 400 K/uL  Basic metabolic panel  Result Value Ref Range   Sodium 138 135 - 145 mmol/L   Potassium 3.4 (L) 3.5 - 5.1 mmol/L   Chloride 104 101 - 111 mmol/L   CO2 23 22 - 32 mmol/L   Glucose, Bld 153 (H) 65 - 99 mg/dL   BUN 10 6 - 20 mg/dL   Creatinine, Ser 0.86 0.44 - 1.00 mg/dL   Calcium 9.2 8.9 - 57.8 mg/dL   GFR calc non Af Amer >60 >60 mL/min   GFR calc Af Amer >60 >60 mL/min   Anion gap 11 5 - 15  Glucose, capillary  Result Value Ref Range   Glucose-Capillary 150 (H) 65 - 99 mg/dL  Glucose, capillary  Result Value Ref Range   Glucose-Capillary 97 65 - 99 mg/dL  Glucose, capillary  Result Value Ref Range   Glucose-Capillary 177 (H) 65 - 99 mg/dL  CBC with Differential/Platelet  Result Value Ref Range   WBC 10.2 4.0 - 10.5 K/uL   RBC 4.34 3.87 - 5.11 MIL/uL   Hemoglobin 14.0 12.0 - 15.0 g/dL   HCT 46.9 62.9 - 52.8 %   MCV 93.3 78.0 - 100.0 fL   MCH 32.3 26.0 - 34.0 pg   MCHC 34.6 30.0 - 36.0 g/dL   RDW 41.3 24.4 - 01.0 %   Platelets 176 150 - 400 K/uL   Neutrophils Relative % 85 (H) 43 - 77 %   Neutro Abs 8.6 (H) 1.7 - 7.7 K/uL   Lymphocytes Relative 13 12 - 46 %   Lymphs Abs 1.4 0.7 - 4.0 K/uL   Monocytes Relative 2 (L) 3 - 12 %   Monocytes Absolute 0.2 0.1 - 1.0 K/uL   Eosinophils Relative 0 0 - 5 %   Eosinophils Absolute 0.0 0.0 - 0.7 K/uL   Basophils Relative 0 0 - 1 %   Basophils Absolute 0.0 0.0 - 0.1 K/uL  Comprehensive metabolic panel  Result Value Ref Range   Sodium 136 135 - 145 mmol/L   Potassium 3.5 3.5 - 5.1 mmol/L   Chloride 103 101 - 111 mmol/L   CO2 21 (L) 22 - 32 mmol/L   Glucose, Bld 235 (H) 65 - 99 mg/dL   BUN 12 6 - 20 mg/dL   Creatinine, Ser 2.72 0.44 - 1.00 mg/dL   Calcium 8.6 (L) 8.9 - 10.3 mg/dL   Total Protein 5.8 (L) 6.5 - 8.1 g/dL   Albumin 3.5 3.5 - 5.0 g/dL   AST 56 (H) 15 - 41 U/L   ALT 57 (H) 14 - 54 U/L   Alkaline Phosphatase 58 38 -  126 U/L   Total Bilirubin 1.1 0.3 - 1.2 mg/dL   GFR calc non Af Amer 57 (L) >60 mL/min   GFR  calc Af Amer >60 >60 mL/min   Anion gap 12 5 - 15  Troponin T  Result Value Ref Range   Troponin T TROPT <0.011 <0.011 ng/mL  CBC  Result Value Ref Range   WBC 10.5 4.0 - 10.5 K/uL   RBC 3.68 (L) 3.87 - 5.11 MIL/uL   Hemoglobin 11.6 (L) 12.0 - 15.0 g/dL   HCT 16.1 (L) 09.6 - 04.5 %   MCV 92.9 78.0 - 100.0 fL   MCH 31.5 26.0 - 34.0 pg   MCHC 33.9 30.0 - 36.0 g/dL   RDW 40.9 81.1 - 91.4 %   Platelets 176 150 - 400 K/uL  Basic Metabolic Panel  Result Value Ref Range   Sodium 134 (L) 135 - 145 mmol/L   Potassium 3.5 3.5 - 5.1 mmol/L   Chloride 98 (L) 101 - 111 mmol/L   CO2 26 22 - 32 mmol/L   Glucose, Bld 144 (H) 65 - 99 mg/dL   BUN 10 6 - 20 mg/dL   Creatinine, Ser 7.82 0.44 - 1.00 mg/dL   Calcium 8.5 (L) 8.9 - 10.3 mg/dL   GFR calc non Af Amer 57 (L) >60 mL/min   GFR calc Af Amer >60 >60 mL/min   Anion gap 10 5 - 15  Hemoglobin A1c  Result Value Ref Range   Hgb A1c MFr Bld 6.9 (H) 4.8 - 5.6 %   Mean Plasma Glucose 151 mg/dL  Glucose, capillary  Result Value Ref Range   Glucose-Capillary 282 (H) 65 - 99 mg/dL   Comment 1 Notify RN    Comment 2 Document in Chart   Glucose, capillary  Result Value Ref Range   Glucose-Capillary 146 (H) 65 - 99 mg/dL   Comment 1 Notify RN    Comment 2 Document in Chart   Glucose, capillary  Result Value Ref Range   Glucose-Capillary 147 (H) 65 - 99 mg/dL    Discharge Medications:     Medication List    TAKE these medications        acetaminophen-codeine 300-30 MG per tablet  Commonly known as:  TYLENOL #3  Take 1 tablet by mouth every 6 (six) hours as needed for moderate pain.     amLODipine 5 MG tablet  Commonly known as:  NORVASC  TAKE ONE TABLET BY MOUTH ONCE DAILY     aspirin 81 MG tablet  Take 81 mg by mouth daily.     benazepril 40 MG tablet  Commonly known as:  LOTENSIN  TAKE ONE TABLET BY MOUTH ONCE DAILY     CALCIUM  1000 + D PO  Take 1,000 mg by mouth 2 (two) times daily.     diazepam 10 MG tablet  Commonly known as:  VALIUM  Take 1 tablet (10 mg total) by mouth every 6 (six) hours as needed for anxiety.     diazepam 10 MG tablet  Commonly known as:  VALIUM  Take 10 mg by mouth every 6 (six) hours as needed for anxiety.     docusate sodium 100 MG capsule  Commonly known as:  COLACE  Take 1 capsule (100 mg total) by mouth 2 (two) times daily.     DULoxetine 20 MG capsule  Commonly known as:  CYMBALTA  Take 20 mg by mouth daily.     fish oil-omega-3 fatty acids 1000 MG capsule  Take 1-2 g by mouth 3 (three) times daily. 1 capsule morning and evening , 2 capsules at lunch  furosemide 40 MG tablet  Commonly known as:  LASIX  TAKE ONE TABLET BY MOUTH TWICE DAILY     Garlic 1000 MG Caps  Take by mouth daily.     glimepiride 4 MG tablet  Commonly known as:  AMARYL  Take 4 mg by mouth 2 (two) times daily.     hydrALAZINE 25 MG tablet  Commonly known as:  APRESOLINE  Take 1 tablet (25 mg total) by mouth 3 (three) times daily.     HYDROcodone-acetaminophen 5-325 MG per tablet  Commonly known as:  NORCO/VICODIN  Take 1-2 tablets by mouth every 4 (four) hours as needed (mild pain).     ibandronate 150 MG tablet  Commonly known as:  BONIVA  Take 1 tablet (150 mg total) by mouth every 30 (thirty) days. Take in the morning with a full glass of water, on an empty stomach, and do not take anything else by mouth or lie down for the next 30 min.     levothyroxine 112 MCG tablet  Commonly known as:  SYNTHROID, LEVOTHROID  Take 112 mcg by mouth daily before breakfast.     metFORMIN 500 MG tablet  Commonly known as:  GLUCOPHAGE  Take 500-1,000 mg by mouth 2 (two) times daily with a meal. 2 tablets in the morning, and  In the evening     metoprolol tartrate 25 MG tablet  Commonly known as:  LOPRESSOR  TAKE ONE TABLET BY MOUTH TWICE DAILY.     potassium chloride 10 MEQ tablet  Commonly known  as:  K-DUR,KLOR-CON  Take 10 mEq by mouth 4 (four) times daily.     rOPINIRole 0.5 MG tablet  Commonly known as:  REQUIP  Take 0.5 mg by mouth daily.     vitamin B-12 1000 MCG tablet  Commonly known as:  CYANOCOBALAMIN  Take 1,000 mcg by mouth daily.     VITAMIN C ER PO  Take 1,000 mg by mouth daily.        Diagnostic Studies: Dg Lumbar Spine 1 View  13-Aug-2015   CLINICAL DATA:  L3-4 laminectomy.  EXAM: LUMBAR SPINE - 1 VIEW  COMPARISON:  MRI 04/27/2015  FINDINGS: Vertebral body alignment is within normal. There is moderate spondylosis of the lumbar spine to include facet arthropathy. There is disc space narrowing at the L5-S1 level. There is a mild compression fracture of L4 unchanged. Remainder of the exam is unchanged. A surgical instrument is present over the region of the L4 spinous process.  IMPRESSION: Surgical instrument present over the region of the L4 spinous process.  Stable mild L4 compression fracture.  Moderate spondylosis of the lumbar spine with disc disease at the L5-S1 level.   Electronically Signed   By: Elberta Fortis M.D.   On: 2015/08/13 16:30   Dg Bone Density  07/07/2015   CLINICAL DATA:  Postmenopausal. ESTROGEN DEFICIENCY.  EXAM: DUAL X-RAY ABSORPTIOMETRY (DXA) FOR BONE MINERAL DENSITY  FINDINGS: LEFT FOREARM DISTAL 1/3  Bone Mineral Density (BMD):  0.513 g/cm2  Young Adult T-Score:  -3.0  Z-Score:  0.0  LEFT FEMUR NECK  Bone Mineral Density (BMD):  0.565 g/cm2  Young Adult T-Score: -2.6  Z-Score:  -0.3  ASSESSMENT: Patient's diagnostic category is OSTEOPOROSIS by WHO Criteria.  FRACTURE RISK: INCREASED  FRAX: World Health Organization FRAX assessment of absolute fracture risk is not calculated for this patient because the patient has OSTEOPOROSIS.  COMPARISON: BONE MINERAL DENSITY OF THE LEFT FEMUR HAS DECREASED BY 3.8% COMPARED TO THE BASELINE  STUDY OF 2003. THIS IS STATISTICALLY SIGNIFICANT. FOREARM HAS NEVER BEEN IMAGED IN THE PAST. IT IS NOTED THAT LUMBAR SPINE  IMAGING WAS NOT PERFORMED TODAY, AS THE PATIENT IS SCHEDULED FOR BACK SURGERY LATER THIS MONTH, AND THEREFORE FOLLOW-UP IMAGING WHITE THE SPECIFIC FULL THE LUMBAR SPINE. FOREARM IMAGES WERE OBTAINED TODAY FOR THIS REASON.  Effective therapies are available in the form of bisphosphonates, selective estrogen receptor modulators, biologic agents, and hormone replacement therapy (for women). All patients should ensure an adequate intake of dietary calcium (1200 mg daily) and vitamin D (800 IU daily) unless contraindicated.  All treatment decisions require clinical judgment and consideration of individual patient factors, including patient preferences, co-morbidities, previous drug use, risk factors not captured in the FRAX model (e.g., frailty, falls, vitamin D deficiency, increased bone turnover, interval significant decline in bone density) and possible under- or over-estimation of fracture risk by FRAX.  The National Osteoporosis Foundation recommends that FDA-approved medical therapies be considered in postmenopausal women and men age 22 or older with a:  1. Hip or vertebral (clinical or morphometric) fracture.  2. T-score of -2.5 or lower at the spine or hip.  3. Ten-year fracture probability by FRAX of 3% or greater for hip fracture or 20% or greater for major osteoporotic fracture.  People with diagnosed cases of osteoporosis or at high risk for fracture should have regular bone mineral density tests. For patients eligible for Medicare, routine testing is allowed once every 2 years. The testing frequency can be increased to one year for patients who have rapidly progressing disease, those who are receiving or discontinuing medical therapy to restore bone mass, or have additional risk factors.  World Science writer [facility]) Criteria:  Normal: T-scores from +1.0 to -1.0  Low Bone Mass (Osteopenia): T-scores between -1.0 and -2.5  Osteoporosis: T-scores -2.5 and below  Comparison to Reference Population:  T-score  is the key measure used in the diagnosis of osteoporosis and relative risk determination for fracture. It provides a value for bone mass relative to the mean bone mass of a young adult reference population expressed in terms of standard deviation (SD).  Z-score is the age-matched score showing the patient's values compared to a population matched for age, sex, and race. This is also expressed in terms of standard deviation. The patient may have values that compare favorably to the age-matched values and still be at increased risk for fracture.   Electronically Signed   By: Britta Mccreedy M.D.   On: 07/07/2015 12:12    Disposition:       Discharge Instructions    Call MD / Call 911    Complete by:  As directed   If you experience chest pain or shortness of breath, CALL 911 and be transported to the hospital emergency room.  If you develope a fever above 101 F, pus (white drainage) or increased drainage or redness at the wound, or calf pain, call your surgeon's office.     Constipation Prevention    Complete by:  As directed   Drink plenty of fluids.  Prune juice may be helpful.  You may use a stool softener, such as Colace (over the counter) 100 mg twice a day.  Use MiraLax (over the counter) for constipation as needed.     Diet - low sodium heart healthy    Complete by:  As directed      Discharge instructions    Complete by:  As directed   No lifting greater than 10 lbs.  Avoid bending, stooping and twisting. Walk in house for first week them may start to get out slowly increasing distance up to one quarter mile by 3 weeks post op. Keep incision dry for 3 days, may use tegaderm or similar water impervious dressing.     Driving restrictions    Complete by:  As directed   No driving for 6 weeks     Increase activity slowly as tolerated    Complete by:  As directed      Lifting restrictions    Complete by:  As directed   No lifting for 8 weeks           Follow-up Information    Follow  up with Shyteria Lewis E, MD In 2 weeks.   Specialty:  Orthopedic Surgery   Why:  For wound re-check   Contact information:   [address] [place] [address] [phone]       Follow up with Ronny Flurry, MD On 10/10/2015.   Specialty:  Cardiology   Why:  2:15pm   Contact information:   [address] [place] [address] [phone]        Signed: Kerrin Champagne 07/27/2015, 5:38 PM

## 2015-07-27 NOTE — Progress Notes (Signed)
Subjective: Doing well this morning.  preop bilat leg symptoms much better.  Afib postop.  No complaints of chest pain.    Objective: Vital signs in last 24 hours: Temp:  [96.9 F (36.1 C)-99.1 F (37.3 C)] 98.9 F (37.2 C) (08/24 0610) Pulse Rate:  [65-115] 68 (08/24 0610) Resp:  [13-21] 18 (08/24 0610) BP: (107-191)/(50-84) 164/55 mmHg (08/24 0610) SpO2:  [96 %-100 %] 98 % (08/24 0610) Weight:  [94.257 kg (207 lb 12.8 oz)] 94.257 kg (207 lb 12.8 oz) (08/23 1030)  Intake/Output from previous day: 08/23 0701 - 08/24 0700 In: 1100 [I.V.:1100] Out: 200 [Blood:200] Intake/Output this shift:     Recent Labs  07/26/15 1035 07/26/15 1814 07/27/15 0534  HGB 13.8 14.0 11.6*    Recent Labs  07/26/15 1814 07/27/15 0534  WBC 10.2 10.5  RBC 4.34 3.68*  HCT 40.5 34.2*  PLT 176 176    Recent Labs  07/26/15 1814 07/27/15 0534  NA 136 134*  K 3.5 3.5  CL 103 98*  CO2 21* 26  BUN 12 10  CREATININE 0.93 0.93  GLUCOSE 235* 144*  CALCIUM 8.6* 8.5*   No results for input(s): LABPT, INR in the last 72 hours.  Exam:  Alert and oriented.  bilat calves nontender.  Trace right ehl/ant tib weakness.  All other stength maneuvers good.    Assessment/Plan: Appreciate cardiology help.  Will plan to discharge home when cleared by cardiology team.     Naida Sleight 07/27/2015, 9:11 AM

## 2015-07-27 NOTE — Care Management Note (Signed)
Case Management Note  Patient Details  Name: Sierra Wright MRN: [id number] Date of Birth: [birth year]  Subjective/Objective:                    Action/Plan: Met with patient and husband to discuss home health needs.  Patient and husband state that they are definitely not interested in any home health services.  They report having all necessary DME at home.  No further discharge needs identified at this time. Bedside RN updated. Expected Discharge Date:                  Expected Discharge Plan:  Home/Self Care  In-House Referral:     Discharge planning Services  CM Consult  Post Acute Care Choice:  Home Health, Durable Medical Equipment Choice offered to:  Patient, Spouse  DME Arranged:    DME Agency:     HH Arranged:  Patient Refused [place] Agency:     Status of Service:  Completed, signed off  Medicare Important Message Given:    Date Medicare IM Given:    Medicare IM give by:    Date Additional Medicare IM Given:    Additional Medicare Important Message give by:     If discussed at [place] of Stay Meetings, dates discussed:    Additional Comments:  Rolm Baptise, RN 07/27/2015, 10:31 AM

## 2015-07-27 NOTE — Evaluation (Signed)
Physical Therapy Evaluation and Discharge Patient Details Name: Sierra Wright MRN: [id number] DOB: 07-Mar-1934 Today's Date: 07/27/2015   History of Present Illness  81 y.o. s/p BILATERAL Lumbar 3-4 SUBTOTAL HEMILAMINECTOMY WITH LATERAL RECESS DECOMPRESSION. Paroxysmal atrial fib only lasted for several hours and resolved spontaneously .  Clinical Impression  Patient evaluated by Physical Therapy with no further acute PT needs identified. All education has been completed and the patient has no further questions. HR 66-94 during ambulatory bout. No loss of balance noted, performing mobility at a supervision level. Family reports pt actually mobilizing better than her prior baseline. Reviewed precautions and safety with mobility. Eager to return home See below for any follow-up Physial Therapy or equipment needs. PT is signing off. Thank you for this referral.     Follow Up Recommendations No PT follow up;Supervision for mobility/OOB    Equipment Recommendations  None recommended by PT    Recommendations for Other Services       Precautions / Restrictions Precautions Precautions: Back Precaution Booklet Issued: Yes (comment) Precaution Comments: Reviewed precautions Restrictions Weight Bearing Restrictions: No Other Position/Activity Restrictions: no lifting      Mobility  Bed Mobility               General bed mobility comments: In recliner when PT entered room. Declines to practice. Discussed log-roll for safe bed mobility, family reports pt performs this technique at baseline.  Transfers Overall transfer level: Needs assistance Equipment used: Rolling walker (2 wheeled) Transfers: Sit to/from Stand Sit to Stand: Supervision         General transfer comment: Supervision for safety. VC for hand placement. Good control with descent, no physical assist needed.  Ambulation/Gait Ambulation/Gait assistance: Supervision Ambulation Distance (Feet): 30 Feet Assistive  device: Rolling walker (2 wheeled) Gait Pattern/deviations: Step-through pattern;Decreased stride length;Trunk flexed Gait velocity: slow   General Gait Details: VC for upright posture and awareness of back precautions. Good control of RW in narrow spaces. No buckling of LEs noted. Family states pt ambulating better than at her baseline.  Stairs            Wheelchair Mobility    Modified Rankin (Stroke Patients Only)       Balance Overall balance assessment: Needs assistance Sitting-balance support: No upper extremity supported;Feet supported Sitting balance-Leahy Scale: Good     Standing balance support: Single extremity supported Standing balance-Leahy Scale: Poor                               Pertinent Vitals/Pain Pain Assessment: 0-10 Pain Score: 4  Pain Location: back Pain Descriptors / Indicators: Aching Pain Intervention(s): Monitored during session;Repositioned    Home Living Family/patient expects to be discharged to:: Private residence Living Arrangements: Spouse/significant other Available Help at Discharge: Family;Available 24 hours/day Type of Home: House Home Access: Ramped entrance     Home Layout: One level Home Equipment: Walker - 2 wheels;Wheelchair - manual;Cane - single point;Cane - quad      Prior Function Level of Independence: Independent with assistive device(s)         Comments: RW for ambulation. Mostly in house     Hand Dominance   Dominant Hand: Right    Extremity/Trunk Assessment   Upper Extremity Assessment: Defer to OT evaluation           Lower Extremity Assessment: Generalized weakness         Communication   Communication: No difficulties  Cognition Arousal/Alertness: Awake/alert Behavior During Therapy: WFL for tasks assessed/performed Overall Cognitive Status: Within Functional Limits for tasks assessed                      General Comments General comments (skin integrity,  edema, etc.): Husband and daughter present during therapy session. Pt not motivated to work with therapy however husband encouraging to pt to mobilize for assessment of needs at d/c. Family reports pt appears to ambulate better than her baseline.    Exercises        Assessment/Plan    PT Assessment Patent does not need any further PT services  PT Diagnosis Abnormality of gait;Generalized weakness;Acute pain   PT Problem List    PT Treatment Interventions     PT Goals (Current goals can be found in the Care Plan section) Acute Rehab PT Goals Patient Stated Goal: Go home soon PT Goal Formulation: All assessment and education complete, DC therapy    Frequency     Barriers to discharge        Co-evaluation               End of Session   Activity Tolerance: Patient tolerated treatment well Patient left: in chair;with call bell/phone within reach;with family/visitor present Nurse Communication: Mobility status    Functional Assessment Tool Used: clinical observation Functional Limitation: Mobility: Walking and moving around Mobility: Walking and Moving Around Current Status (Z6109): At least 1 percent but less than 20 percent impaired, limited or restricted Mobility: Walking and Moving Around Goal Status [phone]): At least 1 percent but less than 20 percent impaired, limited or restricted Mobility: Walking and Moving Around Discharge Status [phone]): At least 1 percent but less than 20 percent impaired, limited or restricted    Time: 9147-8295 PT Time Calculation (min) (ACUTE ONLY): 18 min   Charges:   PT Evaluation $Initial PT Evaluation Tier I: 1 Procedure     PT G Codes:   PT G-Codes **NOT FOR INPATIENT CLASS** Functional Assessment Tool Used: clinical observation Functional Limitation: Mobility: Walking and moving around Mobility: Walking and Moving Around Current Status (A2130): At least 1 percent but less than 20 percent impaired, limited or  restricted Mobility: Walking and Moving Around Goal Status [phone]): At least 1 percent but less than 20 percent impaired, limited or restricted Mobility: Walking and Moving Around Discharge Status [phone]): At least 1 percent but less than 20 percent impaired, limited or restricted    Berton Mount 07/27/2015, 5:28 PM  [facility] [place], [place] [phone]

## 2015-07-27 NOTE — Progress Notes (Signed)
Pt discharged at this time with family taking all personal belongings. IV discontinued, dry dressing applied. Discharge instructions provided with verbal understanding. No noted distress. Pt to pick up prescriptions at her pharmacy. Will schedule follow up appt.

## 2015-07-27 NOTE — Progress Notes (Signed)
     Subjective: 1 Day Post-Op Procedure(s) (LRB): BILATERAL Lumbar 3-4 SUBTOTAL HEMILAMINECTOMY WITH LATERAL RECESS DECOMPRESSION (N/A) Awake, alert and Oriented x 4. Mild discomfort. Cardiology notes she has converted to NSR. Patient reports pain as mild.    Objective:   VITALS:  Temp:  [96.9 F (36.1 C)-99.1 F (37.3 C)] 98.1 F (36.7 C) (08/24 1013) Pulse Rate:  [66-115] 81 (08/24 1013) Resp:  [13-21] 18 (08/24 1013) BP: (107-164)/(50-80) 155/51 mmHg (08/24 1013) SpO2:  [95 %-100 %] 95 % (08/24 1013)  Neurologically intact ABD soft Neurovascular intact Sensation intact distally Intact pulses distally Dorsiflexion/Plantar flexion intact Incision: no drainage No cellulitis present   LABS  Recent Labs  07/26/15 1035 07/26/15 1814 07/27/15 0534  HGB 13.8 14.0 11.6*  WBC 8.8 10.2 10.5  PLT 188 176 176    Recent Labs  07/26/15 1814 07/27/15 0534  NA 136 134*  K 3.5 3.5  CL 103 98*  CO2 21* 26  BUN 12 10  CREATININE 0.93 0.93  GLUCOSE 235* 144*   No results for input(s): LABPT, INR in the last 72 hours.   Assessment/Plan: 1 Day Post-Op Procedure(s) (LRB): BILATERAL Lumbar 3-4 SUBTOTAL HEMILAMINECTOMY WITH LATERAL RECESS DECOMPRESSION (N/A) Atrial fibrillation Osteoporosis, with L4 compression fracture.  Advance diet Up with therapy Discharge home with home health  Start boniva monthly   Bruin Bolger E 07/27/2015, 2:25 PM

## 2015-07-27 NOTE — Progress Notes (Signed)
OT Cancellation Note  Patient Details Name: Sierra Wright MRN: [id number] DOB: November 15, 1934   Cancelled Treatment:    Reason Eval/Treat Not Completed: Patient declined, no reason specified Attempted eval with pt reporting having just gotten up with PT.  Pt reports that she is to d/c today and is not interested in therapy now in at home.  Pt and family report they have all DME and AE and no further questions.  If still here tomorrow may benefit from OT eval.  Meryl Hubers, [facility] 07/27/2015, 4:44 PM

## 2015-07-27 NOTE — Progress Notes (Signed)
PROGRESS NOTE  Subjective:    doing well. Has converted to NSR Seems that this paroxysmal atrial fib only lasted for several hours and resolved spontaneously .   Objective:    Vital Signs:   Temp:  [96.9 F (36.1 C)-99.1 F (37.3 C)] 98.1 F (36.7 C) (08/24 1013) Pulse Rate:  [66-115] 81 (08/24 1013) Resp:  [13-21] 18 (08/24 1013) BP: (107-164)/(50-80) 155/51 mmHg (08/24 1013) SpO2:  [95 %-100 %] 95 % (08/24 1013)  Last BM Date: 07/26/15   24-hour weight change: Weight change:   Weight trends: Filed Weights   07/26/15 1030  Weight: 94.257 kg (207 lb 12.8 oz)    Intake/Output:  08/23 0701 - 08/24 0700 In: 1100 [I.V.:1100] Out: 200 [Blood:200]     Physical Exam: BP 155/51 mmHg  Pulse 81  Temp(Src) 98.1 F (36.7 C) (Oral)  Resp 18  Wt 94.257 kg (207 lb 12.8 oz)  SpO2 95%  Wt Readings from Last 3 Encounters:  07/26/15 94.257 kg (207 lb 12.8 oz)  07/08/15 94.257 kg (207 lb 12.8 oz)  01/26/15 98.884 kg (218 lb)    General: Vital signs reviewed and noted.   Head: Normocephalic, atraumatic.  Eyes: conjunctivae/corneas clear.  EOM's intact.   Throat: normal  Neck:  normal   Lungs:    clear   Heart:  RR   Abdomen:  Soft, non-tender, non-distended    Extremities: No edema    Neurologic: A&O X3, CN II - XII are grossly intact.   Psych: Normal     Labs: BMET:  Recent Labs  07/26/15 1814 07/27/15 0534  NA 136 134*  K 3.5 3.5  CL 103 98*  CO2 21* 26  GLUCOSE 235* 144*  BUN 12 10  CREATININE 0.93 0.93  CALCIUM 8.6* 8.5*    Liver function tests:  Recent Labs  07/26/15 1814  AST 56*  ALT 57*  ALKPHOS 58  BILITOT 1.1  PROT 5.8*  ALBUMIN 3.5   No results for input(s): LIPASE, AMYLASE in the last 72 hours.  CBC:  Recent Labs  07/26/15 1814 07/27/15 0534  WBC 10.2 10.5  NEUTROABS 8.6*  --   HGB 14.0 11.6*  HCT 40.5 34.2*  MCV 93.3 92.9  PLT 176 176    Cardiac Enzymes: No results for input(s): CKTOTAL, CKMB, TROPONINI  in the last 72 hours.  Coagulation Studies: No results for input(s): LABPROT, INR in the last 72 hours.  Other: Invalid input(s): POCBNP No results for input(s): DDIMER in the last 72 hours.  Recent Labs  07/26/15 1035  HGBA1C 6.9*   No results for input(s): CHOL, HDL, LDLCALC, TRIG, CHOLHDL in the last 72 hours. No results for input(s): TSH, T4TOTAL, T3FREE, THYROIDAB in the last 72 hours.  Invalid input(s): FREET3 No results for input(s): VITAMINB12, FOLATE, FERRITIN, TIBC, IRON, RETICCTPCT in the last 72 hours.   Other results:  Tele   ( personally reviewed )  - NSR with 1st degree AV block   Medications:    Infusions: . sodium chloride    . sodium chloride 50 mL/hr at 07/27/15 0907    Scheduled Medications: . amLODipine  5 mg Oral Daily  . benazepril  40 mg Oral Daily  . calcium-vitamin D  2 tablet Oral BID  . docusate sodium  100 mg Oral BID  . DULoxetine  20 mg Oral Daily  . furosemide  40 mg Oral BID  . hydrALAZINE  25 mg Oral TID  . insulin aspart  0-15 Units Subcutaneous TID WC  . insulin aspart  4 Units Subcutaneous TID WC  . levothyroxine  112 mcg Oral QAC breakfast  . metoprolol tartrate  25 mg Oral BID  . pantoprazole (PROTONIX) IV  40 mg Intravenous QHS  . potassium chloride  10 mEq Oral QID  . rOPINIRole  0.5 mg Oral Daily  . sodium chloride  3 mL Intravenous Q12H  . vitamin B-12  1,000 mcg Oral Daily    Assessment/ Plan:   Principal Problem:   Spinal stenosis, lumbar region, with neurogenic claudication Active Problems:   Spondylolisthesis of lumbar region  1. Transient paroxysmal atrial fib:   Seems to be related to surgery / anesthesia.   resolved on its own.  Rate was never elevated.  At this point, I do not think she needs to be on anticoagulation .  The Afib was likely related to the anesthesia. We can place a 30 day monitor if needed.   Will determine further eval as OP .   Disposition: she may go home today  Follow up with Dr.  [name] in several months .   Length of Stay: 1  Vesta Mixer, Montez Hageman., MD, [facility] 07/27/2015, 10:36 AM Office [phone] Pager [phone]

## 2015-08-16 ENCOUNTER — Other Ambulatory Visit: Payer: Self-pay | Admitting: Cardiology

## 2015-08-19 ENCOUNTER — Other Ambulatory Visit: Payer: Self-pay | Admitting: Cardiology

## 2015-10-10 ENCOUNTER — Ambulatory Visit (INDEPENDENT_AMBULATORY_CARE_PROVIDER_SITE_OTHER): Payer: Medicare Other | Admitting: Cardiology

## 2015-10-10 ENCOUNTER — Encounter: Payer: Self-pay | Admitting: Cardiology

## 2015-10-10 VITALS — BP 128/68 | HR 68 | Ht 65.0 in | Wt 205.1 lb

## 2015-10-10 DIAGNOSIS — Z951 Presence of aortocoronary bypass graft: Secondary | ICD-10-CM | POA: Diagnosis not present

## 2015-10-10 DIAGNOSIS — R609 Edema, unspecified: Secondary | ICD-10-CM | POA: Diagnosis not present

## 2015-10-10 DIAGNOSIS — I119 Hypertensive heart disease without heart failure: Secondary | ICD-10-CM | POA: Diagnosis not present

## 2015-10-10 DIAGNOSIS — I4510 Unspecified right bundle-branch block: Secondary | ICD-10-CM | POA: Diagnosis not present

## 2015-10-10 NOTE — Patient Instructions (Signed)
Medication Instructions:  Your physician recommends that you continue on your current medications as directed. Please refer to the Current Medication list given to you today.  Labwork: none  Testing/Procedures: none  Follow-Up: Your physician wants you to follow-up in: 6 month ov with Dr [name] You will receive a reminder letter in the mail two months in advance. If you don't receive a letter, please call our office to schedule the follow-up appointment.   If you need a refill on your cardiac medications before your next appointment, please call your pharmacy.  

## 2015-10-10 NOTE — Progress Notes (Signed)
Cardiology Office Note   Date:  10/11/2015   ID:  Sierra Wright, DOB 11/21/1934, MRN [id number]  PCP:  Emeterio Reeve, MD  Cardiologist: Cassell Clement MD  Chief Complaint  Patient presents with  . Hypertension      History of Present Illness: Sierra Wright is a 81 y.o. female who presents for a six-month office visit This pleasant 81 year old woman is seen for a followup office visit. Her PCP is Dr. [name]. The patient has a history of known ischemic heart disease. She had successful CABG 16 years ago by Dr. [name]. She has not been experiencing any subsequent chest pain or angina. She notes occasional shortness of breath.  Her last echocardiogram was 03/25/14 and showed an ejection fraction of 65-70% with grade 1 diastolic dysfunction.  She has a history of hypertension and she has a past history of mild fluid retention responding to Lasix. She has intermittent ankle edema. She has occasional mild dizzy spells but no syncope. She has not been experiencing any palpitations or tachycardia. She is relatively sedentary because of significant osteoarthritis of her knees. She arrived in our office today by wheelchair. She sleeps with one pillow under her head and also a pillow under her feet to help with her mild dependent edema. During the day she wears compression hose. She does not have any history of TIA or stroke. She does not have any history of peptic ulcer disease or GI bleeding. She has a history of intolerance to cholesterol lowering drugs including statins, ezetimibe, and Welchol. Family history is positive for ischemic heart disease. Her father died at 38 and a brother died at 31 of heart attacks.  At her last visit we decreased her beta blocker because of bradycardia and we increased her hydralazine because of systolic hypertension. Since then her blood pressure has been satisfactory. The patient has not been having any chest pain.  She has had no recurrence of  the paroxysmal atrial flutter fibrillation which occurred in August 2016 immediately postoperative. she does get short of breath with minimal housework activities.  Her peripheral edema has resolved.  EKG today shows normal sinus rhythm.   Past Medical History  Diagnosis Date  . Coronary artery disease 1999    CABG  . Shortness of breath 09/03/12    ECHO- The LA is Moderately Dilated, mild mitral annular calcification. mild pulmonary hypertension, moderate concentric LV hypertrophy. Atrial septum is aneurysmal. Aortic valve appears to be mildly sclerotic.EF 88%  . Hypertension   . PONV (postoperative nausea and vomiting)   . Weakness of both legs   . Restless leg syndrome   . Diabetic neuropathy (HCC)   . Hypothyroidism     synthroid  . Diabetes mellitus without complication (HCC)     type II; metformin and Amaryl  . Depression   . Urinary incontinence   . HOH (hard of hearing)   . Arthritis   . History of anemia as a child     Past Surgical History  Procedure Laterality Date  . Cardiac catherization  08/20/03    Widely patent bypass grafts. Normal left ventricular systolic function.  . Colonoscopy    . Eye surgery      bilateral cataracts  . Cholecystectomy    . Appendectomy    . Abdominal hysterectomy    . Back surgery    . Shoulder surgery    . Tubal ligation    . Cardiac catheterization    . Coronary artery bypass  graft    . Lumbar laminectomy/decompression microdiscectomy N/A 07/26/2015    Procedure: BILATERAL Lumbar 3-4 SUBTOTAL HEMILAMINECTOMY WITH LATERAL RECESS DECOMPRESSION;  Surgeon: Kerrin ChampagneJames E Nitka, MD;  Location: [facility] OR;  Service: Orthopedics;  Laterality: N/A;     Current Outpatient Prescriptions  Medication Sig Dispense Refill  . amLODipine (NORVASC) 5 MG tablet TAKE ONE TABLET BY MOUTH ONCE DAILY 30 tablet 2  . Ascorbic Acid (VITAMIN C ER PO) Take 1,000 mg by mouth daily.     Marland Kitchen. aspirin 81 MG tablet Take 81 mg by mouth daily.    . benazepril (LOTENSIN) 40  MG tablet TAKE ONE TABLET BY MOUTH ONCE DAILY 90 tablet 0  . Calcium Carb-Cholecalciferol (CALCIUM 1000 + D PO) Take 1,000 mg by mouth 2 (two) times daily.     . diazepam (VALIUM) 10 MG tablet Take 10 mg by mouth every 6 (six) hours as needed for anxiety.    . diazepam (VALIUM) 10 MG tablet Take 1 tablet (10 mg total) by mouth every 6 (six) hours as needed for anxiety. 10 tablet 0  . fish oil-omega-3 fatty acids 1000 MG capsule Take 1-2 g by mouth 3 (three) times daily. 1 capsule morning and evening , 2 capsules at lunch    . furosemide (LASIX) 40 MG tablet TAKE ONE TABLET BY MOUTH TWICE DAILY 60 tablet 3  . Garlic 1000 MG CAPS Take by mouth daily.    Marland Kitchen. glimepiride (AMARYL) 4 MG tablet Take 4 mg by mouth 2 (two) times daily.    . hydrALAZINE (APRESOLINE) 25 MG tablet TAKE ONE TABLET BY MOUTH THREE TIMES DAILY 90 tablet 1  . levothyroxine (SYNTHROID, LEVOTHROID) 112 MCG tablet Take 112 mcg by mouth daily before breakfast.    . metFORMIN (GLUCOPHAGE) 500 MG tablet Take 500-1,000 mg by mouth 2 (two) times daily with a meal. 2 tablets in the morning, and  In the evening    . metoprolol tartrate (LOPRESSOR) 25 MG tablet TAKE ONE TABLET BY MOUTH TWICE DAILY 60 tablet 2  . potassium chloride (K-DUR,KLOR-CON) 10 MEQ tablet Take 10 mEq by mouth 4 (four) times daily.    . vitamin B-12 (CYANOCOBALAMIN) 1000 MCG tablet Take 1,000 mcg by mouth daily.     No current facility-administered medications for this visit.    Allergies:   Statins; Amitriptyline; and Welchol    Social History:  The patient  reports that she has never smoked. She does not have any smokeless tobacco history on file. She reports that she does not drink alcohol or use illicit drugs.   Family History:  The patient's family history includes Heart attack in her father; Other in her mother.    ROS:  Please see the history of present illness.   Otherwise, review of systems are positive for none.   All other systems are reviewed and  negative.    PHYSICAL EXAM: VS:  BP 128/68 mmHg  Pulse 68  Ht 5\' 5"  (1.651 m)  Wt 205 lb 1.9 oz (93.042 kg)  BMI 34.13 kg/m2 , BMI Body mass index is 34.13 kg/(m^2). GEN: Well nourished, well developed, in no acute distress HEENT: normal Neck: no JVD, carotid bruits, or masses Cardiac: RRR; no murmurs, rubs, or gallops,no edema  Respiratory:  clear to auscultation bilaterally, normal work of breathing GI: soft, nontender, nondistended, + BS MS: no deformity or atrophy Skin: warm and dry, no rash Neuro:  Strength and sensation are intact Psych: euthymic mood, full affect   EKG:  EKG  is ordered today. The ekg ordered today demonstrates normal sinus rhythm with first-degree AV block.  Bifascicular block.  Since previous tracing of 07/26/15, sinus rhythm has replaced atrial flutter   Recent Labs: 07/26/2015: ALT 57* 07/27/2015: BUN 10; Creatinine, Ser 0.93; Hemoglobin 11.6*; Platelets 176; Potassium 3.5; Sodium 134*    Lipid Panel No results found for: CHOL, TRIG, HDL, CHOLHDL, VLDL, LDLCALC, LDLDIRECT    Wt Readings from Last 3 Encounters:  10/10/15 205 lb 1.9 oz (93.042 kg)  07/26/15 207 lb 12.8 oz (94.257 kg)  07/08/15 207 lb 12.8 oz (94.257 kg)         ASSESSMENT AND PLAN:  1. ischemic heart disease status post CABG 16 years ago. No recurrent angina.  2. chronic diastolic CHF, compensated.  3. hypertensive cardiovascular disease  4. diabetes mellitus,type II  5. right bundle branch block, old  6.  Paroxysmal atrial flutter fibrillation, isolated incident which occurred in the postoperative setting and has not recurred, currently maintaining normal sinus rhythm with first degree AV block.  Not on long-term anticoagulation  7. sedentary lifestyle secondary to osteoarthritis of knees. She has seen orthopedic surgery but no discussion of surgical intervention was made.  8. hypothyroidism    Current medicines are reviewed at length with the patient today.  The  patient does not have concerns regarding medicines.  The following changes have been made:  no change  Labs/ tests ordered today include:   Orders Placed This Encounter  Procedures  . EKG 12-Lead     Disposition: Continue current medication.  Recheck in 6 months for office visit with Dr. Elease Hashimoto.  Karie Schwalbe MD 10/11/2015 11:19 AM    [facility] [address] [place], [place], [place]  [address] Phone: [phone]; Fax: [phone]

## 2015-10-13 ENCOUNTER — Other Ambulatory Visit: Payer: Self-pay | Admitting: Cardiology

## 2015-11-13 ENCOUNTER — Other Ambulatory Visit: Payer: Self-pay | Admitting: Cardiology

## 2015-12-13 ENCOUNTER — Other Ambulatory Visit: Payer: Self-pay | Admitting: Cardiology

## 2016-03-21 ENCOUNTER — Encounter: Payer: Self-pay | Admitting: Cardiovascular Disease

## 2016-03-28 ENCOUNTER — Ambulatory Visit (INDEPENDENT_AMBULATORY_CARE_PROVIDER_SITE_OTHER): Payer: Medicare Other | Admitting: Ophthalmology

## 2016-03-28 DIAGNOSIS — H35033 Hypertensive retinopathy, bilateral: Secondary | ICD-10-CM | POA: Diagnosis not present

## 2016-03-28 DIAGNOSIS — H34831 Tributary (branch) retinal vein occlusion, right eye, with macular edema: Secondary | ICD-10-CM

## 2016-03-28 DIAGNOSIS — E11311 Type 2 diabetes mellitus with unspecified diabetic retinopathy with macular edema: Secondary | ICD-10-CM | POA: Diagnosis not present

## 2016-03-28 DIAGNOSIS — E113311 Type 2 diabetes mellitus with moderate nonproliferative diabetic retinopathy with macular edema, right eye: Secondary | ICD-10-CM

## 2016-03-28 DIAGNOSIS — I10 Essential (primary) hypertension: Secondary | ICD-10-CM | POA: Diagnosis not present

## 2016-03-28 DIAGNOSIS — H43813 Vitreous degeneration, bilateral: Secondary | ICD-10-CM

## 2016-03-28 DIAGNOSIS — E113292 Type 2 diabetes mellitus with mild nonproliferative diabetic retinopathy without macular edema, left eye: Secondary | ICD-10-CM | POA: Diagnosis not present

## 2016-04-16 ENCOUNTER — Encounter: Payer: Self-pay | Admitting: Cardiovascular Disease

## 2016-04-16 ENCOUNTER — Ambulatory Visit (INDEPENDENT_AMBULATORY_CARE_PROVIDER_SITE_OTHER): Payer: Medicare Other | Admitting: Cardiovascular Disease

## 2016-04-16 VITALS — BP 146/80 | HR 70 | Ht 65.0 in | Wt 198.0 lb

## 2016-04-16 DIAGNOSIS — I5032 Chronic diastolic (congestive) heart failure: Secondary | ICD-10-CM | POA: Insufficient documentation

## 2016-04-16 DIAGNOSIS — Z951 Presence of aortocoronary bypass graft: Secondary | ICD-10-CM | POA: Diagnosis not present

## 2016-04-16 NOTE — Progress Notes (Signed)
Cardiology Office Note   Date:  04/16/2016   ID:  Sierra Wright, DOB Dec 19, 1933, MRN [id number]  PCP:  Sierra Reeve, MD  Cardiologist: Sierra Clement MD  --> Sierra Wright   No chief complaint on file.     History of Present Illness: Sierra Wright is a 82 y.o. female who presents for a six-month office visit This pleasant 82 year old woman is seen for a followup office visit. Her PCP is Dr. Mila Wright. The patient has a history of known ischemic heart disease. She had successful CABG 16 years ago by Dr. Tyrone Wright. She has not been experiencing any subsequent chest pain or angina. She notes occasional shortness of breath.  Her last echocardiogram was 03/25/14 and showed an ejection fraction of 65-70% with grade 1 diastolic dysfunction.  She has a history of hypertension and she has a past history of mild fluid retention responding to Lasix. She has intermittent ankle edema. She has occasional mild dizzy spells but no syncope. She has not been experiencing any palpitations or tachycardia. She is relatively sedentary because of significant osteoarthritis of her knees. She arrived in our office today by wheelchair. She sleeps with one pillow under her head and also a pillow under her feet to help with her mild dependent edema. During the day she wears compression hose. She does not have any history of TIA or stroke. She does not have any history of peptic ulcer disease or GI bleeding. She has a history of intolerance to cholesterol lowering drugs including statins, ezetimibe, and Welchol. Family history is positive for ischemic heart disease. Her father died at 62 and a brother died at 64 of heart attacks.  At her last visit we decreased her beta blocker because of bradycardia and we increased her hydralazine because of systolic hypertension. Since then her blood pressure has been satisfactory. The patient has not been having any chest pain.  She has had no recurrence of the paroxysmal  atrial flutter fibrillation which occurred in August 2016 immediately postoperative. she does get short of breath with minimal housework activities.  Her peripheral edema has resolved.  EKG today shows normal sinus rhythm.  Apr 16, 2016:  Pt is seen today .   Recent transfer from Dr. [name].  Has hx of CAD, chronic diastolic CHF,  Essential HTN. No cardiac issues. Is in a wheelchair ,   Uses a walker at home  Avoids salt .   Past Medical History  Diagnosis Date  . Coronary artery disease 1999    CABG  . Shortness of breath 09/03/12    ECHO- The LA is Moderately Dilated, mild mitral annular calcification. mild pulmonary hypertension, moderate concentric LV hypertrophy. Atrial septum is aneurysmal. Aortic valve appears to be mildly sclerotic.EF 88%  . Hypertension   . PONV (postoperative nausea and vomiting)   . Weakness of both legs   . Restless leg syndrome   . Diabetic neuropathy (HCC)   . Hypothyroidism     synthroid  . Diabetes mellitus without complication (HCC)     type II; metformin and Amaryl  . Depression   . Urinary incontinence   . HOH (hard of hearing)   . Arthritis   . History of anemia as a child     Past Surgical History  Procedure Laterality Date  . Cardiac catherization  08/20/03    Widely patent bypass grafts. Normal left ventricular systolic function.  . Colonoscopy    . Eye surgery      bilateral  cataracts  . Cholecystectomy    . Appendectomy    . Abdominal hysterectomy    . Back surgery    . Shoulder surgery    . Tubal ligation    . Cardiac catheterization    . Coronary artery bypass graft    . Lumbar laminectomy/decompression microdiscectomy N/A 07/26/2015    Procedure: BILATERAL Lumbar 3-4 SUBTOTAL HEMILAMINECTOMY WITH LATERAL RECESS DECOMPRESSION;  Surgeon: Kerrin ChampagneJames E Nitka, MD;  Location: [facility] OR;  Service: Orthopedics;  Laterality: N/A;     Current Outpatient Prescriptions  Medication Sig Dispense Refill  . amLODipine (NORVASC) 5 MG tablet  TAKE ONE TABLET BY MOUTH ONCE DAILY 30 tablet 5  . Ascorbic Acid (VITAMIN C ER PO) Take 1,000 mg by mouth daily.     Marland Kitchen. aspirin 81 MG tablet Take 81 mg by mouth daily.    . benazepril (LOTENSIN) 40 MG tablet TAKE ONE TABLET BY MOUTH ONCE DAILY 30 tablet 6  . Calcium Carb-Cholecalciferol (CALCIUM 1000 + D PO) Take 1,000 mg by mouth 2 (two) times daily.     . diazepam (VALIUM) 10 MG tablet Take 10 mg by mouth every 6 (six) hours as needed for anxiety.    . fish oil-omega-3 fatty acids 1000 MG capsule Take 1-2 g by mouth 3 (three) times daily. 1 capsule morning and evening , 2 capsules at lunch    . furosemide (LASIX) 40 MG tablet TAKE ONE TABLET BY MOUTH TWICE DAILY. (Patient taking differently: TAKE ONE TABLET BY MOUTH  DAILY.) 60 tablet 6  . Garlic 1000 MG CAPS Take 1,000 mg by mouth daily.     Marland Kitchen. glimepiride (AMARYL) 4 MG tablet Take 4 mg by mouth daily with breakfast.     . hydrALAZINE (APRESOLINE) 25 MG tablet TAKE ONE TABLET BY MOUTH THREE TIMES DAILY 90 tablet 11  . levothyroxine (SYNTHROID, LEVOTHROID) 112 MCG tablet Take 112 mcg by mouth daily before breakfast.    . metFORMIN (GLUCOPHAGE) 500 MG tablet Take 500-1,000 mg by mouth 2 (two) times daily with a meal. 2 tablets in the morning, and  In the evening    . metoprolol tartrate (LOPRESSOR) 25 MG tablet TAKE ONE TABLET BY MOUTH TWICE DAILY 60 tablet 6  . potassium chloride (K-DUR,KLOR-CON) 10 MEQ tablet Take 10 mEq by mouth 4 (four) times daily.    . vitamin B-12 (CYANOCOBALAMIN) 1000 MCG tablet Take 1,000 mcg by mouth daily.     No current facility-administered medications for this visit.    Allergies:   Statins; Amitriptyline; and Welchol    Social History:  The patient  reports that she has never smoked. She does not have any smokeless tobacco history on file. She reports that she does not drink alcohol or use illicit drugs.   Family History:  The patient's family history includes Heart attack in her father; Other in her mother.     ROS:  Please see the history of present illness.   Otherwise, review of systems are positive for none.   All other systems are reviewed and negative.    PHYSICAL EXAM: VS:  BP 146/80 mmHg  Pulse 70  Ht 5\' 5"  (1.651 m)  Wt 198 lb (89.812 kg)  BMI 32.95 kg/m2  SpO2 96% , BMI Body mass index is 32.95 kg/(m^2). GEN: Well nourished, well developed, in no acute distress HEENT: normal Neck: no JVD, carotid bruits, or masses Cardiac: RRR; no murmurs, rubs, or gallops,no edema  Respiratory:  clear to auscultation bilaterally, normal work of breathing  GI: soft, nontender, nondistended, + BS MS: no deformity or atrophy Skin: warm and dry, no rash Neuro:  Strength and sensation are intact    EKG:  EKG is ordered today. The ekg ordered today demonstrates normal sinus rhythm with first-degree AV block.  Bifascicular block.  Since previous tracing of 07/26/15, sinus rhythm has replaced atrial flutter   Recent Labs: 07/26/2015: ALT 57* 07/27/2015: BUN 10; Creatinine, Ser 0.93; Hemoglobin 11.6*; Platelets 176; Potassium 3.5; Sodium 134*    Lipid Panel No results found for: CHOL, TRIG, HDL, CHOLHDL, VLDL, LDLCALC, LDLDIRECT    Wt Readings from Last 3 Encounters:  04/16/16 198 lb (89.812 kg)  10/10/15 205 lb 1.9 oz (93.042 kg)  07/26/15 207 lb 12.8 oz (94.257 kg)         ASSESSMENT AND PLAN:  1. ischemic heart disease status post CABG 16 years ago. No recurrent angina.  2. chronic diastolic CHF, compensated. no severe doe   3. hypertensive cardiovascular disease  4. diabetes mellitus,type II  5. right bundle branch block, old  6.  Paroxysmal atrial flutter fibrillation, isolated incident which occurred in the postoperative setting and has not recurred, currently maintaining normal sinus rhythm with first degree AV block.  Not on long-term anticoagulation  7. sedentary lifestyle secondary to osteoarthritis of knees. She has seen orthopedic surgery but no discussion of  surgical intervention was made.  8. hypothyroidism    Current medicines are reviewed at length with the patient today.  The patient does not have concerns regarding medicines.  The following changes have been made:  no change  Labs/ tests ordered today include:   No orders of the defined types were placed in this encounter.     Disposition: Continue current medication.  Recheck in 6      Kristeen Miss, MD  04/16/2016 5:07 PM    [facility] [address] [place],  [address] [place], [place]  [address] Pager [phone] Phone: [phone]; Fax: [phone]

## 2016-04-16 NOTE — Patient Instructions (Signed)
Medication Instructions:  Your physician recommends that you continue on your current medications as directed. Please refer to the Current Medication list given to you today.   Labwork: None ordered   Testing/Procedures: None Ordered   Follow-Up: Your physician wants you to follow-up in: 6 months with Dr. Elease HashimotoNahser.  You will receive a reminder letter in the mail two months in advance. If you don't receive a letter, please call our office to schedule the follow-up appointment.   If you need a refill on your cardiac medications before your next appointment, please call your pharmacy.   Thank you for choosing [facility]! Eligha BridegroomMichelle Swinyer, RN [phone]

## 2016-04-26 ENCOUNTER — Encounter (INDEPENDENT_AMBULATORY_CARE_PROVIDER_SITE_OTHER): Payer: Medicare Other | Admitting: Ophthalmology

## 2016-05-03 ENCOUNTER — Encounter (INDEPENDENT_AMBULATORY_CARE_PROVIDER_SITE_OTHER): Payer: Medicare Other | Admitting: Ophthalmology

## 2016-05-03 DIAGNOSIS — I10 Essential (primary) hypertension: Secondary | ICD-10-CM

## 2016-05-03 DIAGNOSIS — H34831 Tributary (branch) retinal vein occlusion, right eye, with macular edema: Secondary | ICD-10-CM | POA: Diagnosis not present

## 2016-05-03 DIAGNOSIS — E11311 Type 2 diabetes mellitus with unspecified diabetic retinopathy with macular edema: Secondary | ICD-10-CM

## 2016-05-03 DIAGNOSIS — E113292 Type 2 diabetes mellitus with mild nonproliferative diabetic retinopathy without macular edema, left eye: Secondary | ICD-10-CM

## 2016-05-03 DIAGNOSIS — H35033 Hypertensive retinopathy, bilateral: Secondary | ICD-10-CM

## 2016-05-03 DIAGNOSIS — E113311 Type 2 diabetes mellitus with moderate nonproliferative diabetic retinopathy with macular edema, right eye: Secondary | ICD-10-CM | POA: Diagnosis not present

## 2016-05-03 DIAGNOSIS — H43813 Vitreous degeneration, bilateral: Secondary | ICD-10-CM | POA: Diagnosis not present

## 2016-05-07 ENCOUNTER — Other Ambulatory Visit: Payer: Self-pay | Admitting: Cardiovascular Disease

## 2016-05-07 MED ORDER — AMLODIPINE BESYLATE 5 MG PO TABS: 5.0000 mg | ORAL_TABLET | Freq: Every day | ORAL | Status: DC

## 2016-05-31 ENCOUNTER — Encounter (INDEPENDENT_AMBULATORY_CARE_PROVIDER_SITE_OTHER): Payer: Medicare Other | Admitting: Ophthalmology

## 2016-07-09 ENCOUNTER — Other Ambulatory Visit: Payer: Self-pay | Admitting: *Deleted

## 2016-07-09 MED ORDER — BENAZEPRIL HCL 40 MG PO TABS: 40.0000 mg | ORAL_TABLET | Freq: Every day | ORAL | 11 refills | Status: DC

## 2016-07-09 MED ORDER — METOPROLOL TARTRATE 25 MG PO TABS: 25.0000 mg | ORAL_TABLET | Freq: Two times a day (BID) | ORAL | 11 refills | Status: DC

## 2016-07-13 ENCOUNTER — Emergency Department (HOSPITAL_COMMUNITY): Payer: Medicare Other

## 2016-07-13 ENCOUNTER — Other Ambulatory Visit (HOSPITAL_COMMUNITY): Payer: Medicare Other

## 2016-07-13 ENCOUNTER — Inpatient Hospital Stay (HOSPITAL_COMMUNITY)
Admission: EM | Admit: 2016-07-13 | Discharge: 2016-07-15 | DRG: 066 | Disposition: A | Discharge status: Home / Self Care | Payer: Medicare Other | Attending: Internal Medicine | Admitting: Internal Medicine

## 2016-07-13 ENCOUNTER — Encounter (HOSPITAL_COMMUNITY): Payer: Self-pay | Admitting: Emergency Medicine

## 2016-07-13 ENCOUNTER — Observation Stay (HOSPITAL_COMMUNITY): Payer: Medicare Other

## 2016-07-13 DIAGNOSIS — G459 Transient cerebral ischemic attack, unspecified: Secondary | ICD-10-CM | POA: Diagnosis not present

## 2016-07-13 DIAGNOSIS — I639 Cerebral infarction, unspecified: Principal | ICD-10-CM | POA: Diagnosis present

## 2016-07-13 DIAGNOSIS — Z7984 Long term (current) use of oral hypoglycemic drugs: Secondary | ICD-10-CM

## 2016-07-13 DIAGNOSIS — G2581 Restless legs syndrome: Secondary | ICD-10-CM | POA: Diagnosis present

## 2016-07-13 DIAGNOSIS — R270 Ataxia, unspecified: Secondary | ICD-10-CM | POA: Diagnosis present

## 2016-07-13 DIAGNOSIS — Z888 Allergy status to other drugs, medicaments and biological substances status: Secondary | ICD-10-CM

## 2016-07-13 DIAGNOSIS — W010XXA Fall on same level from slipping, tripping and stumbling without subsequent striking against object, initial encounter: Secondary | ICD-10-CM | POA: Diagnosis present

## 2016-07-13 DIAGNOSIS — E876 Hypokalemia: Secondary | ICD-10-CM | POA: Diagnosis present

## 2016-07-13 DIAGNOSIS — H02401 Unspecified ptosis of right eyelid: Secondary | ICD-10-CM | POA: Diagnosis present

## 2016-07-13 DIAGNOSIS — R0602 Shortness of breath: Secondary | ICD-10-CM

## 2016-07-13 DIAGNOSIS — I25810 Atherosclerosis of coronary artery bypass graft(s) without angina pectoris: Secondary | ICD-10-CM

## 2016-07-13 DIAGNOSIS — H9190 Unspecified hearing loss, unspecified ear: Secondary | ICD-10-CM | POA: Diagnosis present

## 2016-07-13 DIAGNOSIS — I441 Atrioventricular block, second degree: Secondary | ICD-10-CM

## 2016-07-13 DIAGNOSIS — I10 Essential (primary) hypertension: Secondary | ICD-10-CM

## 2016-07-13 DIAGNOSIS — Z6833 Body mass index (BMI) 33.0-33.9, adult: Secondary | ICD-10-CM

## 2016-07-13 DIAGNOSIS — I2510 Atherosclerotic heart disease of native coronary artery without angina pectoris: Secondary | ICD-10-CM | POA: Diagnosis present

## 2016-07-13 DIAGNOSIS — E1140 Type 2 diabetes mellitus with diabetic neuropathy, unspecified: Secondary | ICD-10-CM | POA: Diagnosis present

## 2016-07-13 DIAGNOSIS — Z7982 Long term (current) use of aspirin: Secondary | ICD-10-CM

## 2016-07-13 DIAGNOSIS — H532 Diplopia: Secondary | ICD-10-CM | POA: Diagnosis present

## 2016-07-13 DIAGNOSIS — M549 Dorsalgia, unspecified: Secondary | ICD-10-CM | POA: Diagnosis present

## 2016-07-13 DIAGNOSIS — E039 Hypothyroidism, unspecified: Secondary | ICD-10-CM

## 2016-07-13 DIAGNOSIS — Z981 Arthrodesis status: Secondary | ICD-10-CM

## 2016-07-13 DIAGNOSIS — M545 Low back pain: Secondary | ICD-10-CM

## 2016-07-13 DIAGNOSIS — Z79899 Other long term (current) drug therapy: Secondary | ICD-10-CM

## 2016-07-13 DIAGNOSIS — Z951 Presence of aortocoronary bypass graft: Secondary | ICD-10-CM

## 2016-07-13 DIAGNOSIS — Z8249 Family history of ischemic heart disease and other diseases of the circulatory system: Secondary | ICD-10-CM

## 2016-07-13 DIAGNOSIS — E669 Obesity, unspecified: Secondary | ICD-10-CM | POA: Diagnosis present

## 2016-07-13 DIAGNOSIS — E785 Hyperlipidemia, unspecified: Secondary | ICD-10-CM | POA: Diagnosis present

## 2016-07-13 DIAGNOSIS — F329 Major depressive disorder, single episode, unspecified: Secondary | ICD-10-CM | POA: Diagnosis present

## 2016-07-13 DIAGNOSIS — R531 Weakness: Secondary | ICD-10-CM

## 2016-07-13 DIAGNOSIS — E119 Type 2 diabetes mellitus without complications: Secondary | ICD-10-CM

## 2016-07-13 LAB — CBC WITH DIFFERENTIAL/PLATELET
BASOS PCT: 0 %
Basophils Absolute: 0 10*3/uL (ref 0.0–0.1)
EOS PCT: 1 %
Eosinophils Absolute: 0.1 10*3/uL (ref 0.0–0.7)
HEMATOCRIT: 39.1 % (ref 36.0–46.0)
Hemoglobin: 13 g/dL (ref 12.0–15.0)
Lymphocytes Relative: 25 %
Lymphs Abs: 2.4 10*3/uL (ref 0.7–4.0)
MCH: 31 pg (ref 26.0–34.0)
MCHC: 33.2 g/dL (ref 30.0–36.0)
MCV: 93.3 fL (ref 78.0–100.0)
MONO ABS: 0.6 10*3/uL (ref 0.1–1.0)
MONOS PCT: 6 %
NEUTROS ABS: 6.4 10*3/uL (ref 1.7–7.7)
Neutrophils Relative %: 68 %
Platelets: 163 10*3/uL (ref 150–400)
RBC: 4.19 MIL/uL (ref 3.87–5.11)
RDW: 12.5 % (ref 11.5–15.5)
WBC: 9.5 10*3/uL (ref 4.0–10.5)

## 2016-07-13 LAB — COMPREHENSIVE METABOLIC PANEL
ALBUMIN: 3.5 g/dL (ref 3.5–5.0)
ALK PHOS: 52 U/L (ref 38–126)
ALT: 19 U/L (ref 14–54)
ANION GAP: 9 (ref 5–15)
AST: 18 U/L (ref 15–41)
BUN: 21 mg/dL — AB (ref 6–20)
CALCIUM: 9.4 mg/dL (ref 8.9–10.3)
CO2: 25 mmol/L (ref 22–32)
Chloride: 101 mmol/L (ref 101–111)
Creatinine, Ser: 0.91 mg/dL (ref 0.44–1.00)
GFR calc Af Amer: >60 mL/min (ref >60)
GFR calc non Af Amer: 58 mL/min — ABNORMAL LOW (ref >60)
GLUCOSE: 151 mg/dL — AB (ref 65–99)
Potassium: 3.9 mmol/L (ref 3.5–5.1)
SODIUM: 135 mmol/L (ref 135–145)
Total Bilirubin: 0.9 mg/dL (ref 0.3–1.2)
Total Protein: 5.6 g/dL — ABNORMAL LOW (ref 6.5–8.1)

## 2016-07-13 LAB — GLUCOSE, CAPILLARY
Glucose-Capillary: 141 mg/dL — ABNORMAL HIGH (ref 65–99)
Glucose-Capillary: 213 mg/dL — ABNORMAL HIGH (ref 65–99)
Glucose-Capillary: 151 mg/dL — ABNORMAL HIGH (ref 65–99)

## 2016-07-13 LAB — PROTIME-INR
INR: 1
PROTHROMBIN TIME: 13.2 s (ref 11.4–15.2)

## 2016-07-13 LAB — CBG MONITORING, ED: Glucose-Capillary: 146 mg/dL — ABNORMAL HIGH (ref 65–99)

## 2016-07-13 LAB — POCT I-STAT, CHEM 8
BUN: 24 mg/dL — ABNORMAL HIGH (ref 6–20)
Calcium, Ion: 1.11 mmol/L — ABNORMAL LOW (ref 1.12–1.23)
Chloride: 100 mmol/L — ABNORMAL LOW (ref 101–111)
Creatinine, Ser: 0.9 mg/dL (ref 0.44–1.00)
Glucose, Bld: 145 mg/dL — ABNORMAL HIGH (ref 65–99)
HCT: 40 % (ref 36.0–46.0)
Hemoglobin: 13.6 g/dL (ref 12.0–15.0)
Potassium: 3.9 mmol/L (ref 3.5–5.1)
Sodium: 136 mmol/L (ref 135–145)
TCO2: 24 mmol/L (ref 0–100)

## 2016-07-13 LAB — CREATININE, SERUM
Creatinine, Ser: 1 mg/dL (ref 0.44–1.00)
GFR calc Af Amer: >60 mL/min (ref >60)
GFR calc non Af Amer: 52 mL/min — ABNORMAL LOW (ref >60)

## 2016-07-13 LAB — POCT I-STAT TROPONIN I: Troponin i, poc: 0 ng/mL (ref 0.00–0.08)

## 2016-07-13 LAB — CBC
HCT: 39.7 % (ref 36.0–46.0)
Hemoglobin: 13 g/dL (ref 12.0–15.0)
MCH: 30.3 pg (ref 26.0–34.0)
MCHC: 32.7 g/dL (ref 30.0–36.0)
MCV: 92.5 fL (ref 78.0–100.0)
Platelets: 166 10*3/uL (ref 150–400)
RBC: 4.29 MIL/uL (ref 3.87–5.11)
RDW: 12.5 % (ref 11.5–15.5)
WBC: 9.2 10*3/uL (ref 4.0–10.5)

## 2016-07-13 LAB — TSH: TSH: 1.037 u[IU]/mL (ref 0.350–4.500)

## 2016-07-13 LAB — APTT: aPTT: 25 seconds (ref 24–36)

## 2016-07-13 LAB — MAGNESIUM: Magnesium: 1.6 mg/dL — ABNORMAL LOW (ref 1.7–2.4)

## 2016-07-13 LAB — ETHANOL: Alcohol, Ethyl (B): <5 mg/dL (ref <5)

## 2016-07-13 LAB — PHOSPHORUS: Phosphorus: 2.9 mg/dL (ref 2.5–4.6)

## 2016-07-13 MED ORDER — AMLODIPINE BESYLATE 5 MG PO TABS
5.0000 mg | ORAL_TABLET | Freq: Every day | ORAL | Status: DC
  Administered 2016-07-13 – 2016-07-15 (×3): 5 mg via ORAL
  Filled 2016-07-13 (×3): qty 1

## 2016-07-13 MED ORDER — LEVOTHYROXINE SODIUM 112 MCG PO TABS
112.0000 ug | ORAL_TABLET | Freq: Every day | ORAL | Status: DC
  Administered 2016-07-13 – 2016-07-15 (×3): 112 ug via ORAL
  Filled 2016-07-13 (×3): qty 1

## 2016-07-13 MED ORDER — OMEGA-3-ACID ETHYL ESTERS 1 G PO CAPS
2.0000 g | ORAL_CAPSULE | Freq: Every day | ORAL | Status: DC
  Administered 2016-07-13: 2 g via ORAL
  Filled 2016-07-13: qty 2

## 2016-07-13 MED ORDER — METOPROLOL TARTRATE 25 MG PO TABS
25.0000 mg | ORAL_TABLET | Freq: Two times a day (BID) | ORAL | Status: DC
  Administered 2016-07-13 – 2016-07-14 (×2): 25 mg via ORAL
  Filled 2016-07-13 (×2): qty 1

## 2016-07-13 MED ORDER — OMEGA-3 FATTY ACIDS 1000 MG PO CAPS: 1.0000 g | ORAL_CAPSULE | Freq: Three times a day (TID) | ORAL | Status: DC

## 2016-07-13 MED ORDER — DIAZEPAM 5 MG PO TABS
5.0000 mg | ORAL_TABLET | Freq: Four times a day (QID) | ORAL | Status: DC | PRN
Start: 2016-07-13 — End: 2016-07-15
  Administered 2016-07-14 (×2): 5 mg via ORAL
  Filled 2016-07-13 (×2): qty 1

## 2016-07-13 MED ORDER — VITAMIN B-12 1000 MCG PO TABS
1000.0000 ug | ORAL_TABLET | Freq: Every day | ORAL | Status: DC
  Administered 2016-07-13 – 2016-07-15 (×3): 1000 ug via ORAL
  Filled 2016-07-13 (×3): qty 1

## 2016-07-13 MED ORDER — CALCIUM CARB-CHOLECALCIFEROL 600-800 MG-UNIT PO TABS: 1.0000 | ORAL_TABLET | Freq: Two times a day (BID) | ORAL | Status: DC

## 2016-07-13 MED ORDER — INSULIN ASPART 100 UNIT/ML ~~LOC~~ SOLN: 0.0000 [IU] | Freq: Every day | SUBCUTANEOUS | Status: DC

## 2016-07-13 MED ORDER — GADOBENATE DIMEGLUMINE 529 MG/ML IV SOLN
20.0000 mL | Freq: Once | INTRAVENOUS | Status: AC | PRN
  Administered 2016-07-13: 20 mL via INTRAVENOUS

## 2016-07-13 MED ORDER — INSULIN ASPART 100 UNIT/ML ~~LOC~~ SOLN
0.0000 [IU] | Freq: Three times a day (TID) | SUBCUTANEOUS | Status: DC
Start: 2016-07-13 — End: 2016-07-15
  Administered 2016-07-13: 1 [IU] via SUBCUTANEOUS
  Administered 2016-07-13: 2 [IU] via SUBCUTANEOUS
  Administered 2016-07-14 (×3): 1 [IU] via SUBCUTANEOUS
  Administered 2016-07-15: 2 [IU] via SUBCUTANEOUS
  Administered 2016-07-15: 1 [IU] via SUBCUTANEOUS
  Administered 2016-07-15: 2 [IU] via SUBCUTANEOUS

## 2016-07-13 MED ORDER — BENAZEPRIL HCL 20 MG PO TABS
40.0000 mg | ORAL_TABLET | Freq: Every day | ORAL | Status: DC
  Administered 2016-07-13 – 2016-07-15 (×3): 40 mg via ORAL
  Filled 2016-07-13 (×3): qty 2

## 2016-07-13 MED ORDER — HYDRALAZINE HCL 25 MG PO TABS
25.0000 mg | ORAL_TABLET | Freq: Three times a day (TID) | ORAL | Status: DC
  Administered 2016-07-13 – 2016-07-15 (×7): 25 mg via ORAL
  Filled 2016-07-13 (×7): qty 1

## 2016-07-13 MED ORDER — ASPIRIN EC 325 MG PO TBEC
325.0000 mg | DELAYED_RELEASE_TABLET | Freq: Every day | ORAL | Status: DC
  Administered 2016-07-13 – 2016-07-14 (×2): 325 mg via ORAL
  Filled 2016-07-13 (×2): qty 1

## 2016-07-13 MED ORDER — ENOXAPARIN SODIUM 40 MG/0.4ML ~~LOC~~ SOLN
40.0000 mg | SUBCUTANEOUS | Status: DC
  Administered 2016-07-13 – 2016-07-15 (×3): 40 mg via SUBCUTANEOUS
  Filled 2016-07-13 (×3): qty 0.4

## 2016-07-13 MED ORDER — OMEGA-3-ACID ETHYL ESTERS 1 G PO CAPS
1.0000 g | ORAL_CAPSULE | Freq: Two times a day (BID) | ORAL | Status: DC
  Administered 2016-07-13 – 2016-07-15 (×4): 1 g via ORAL
  Filled 2016-07-13 (×4): qty 1

## 2016-07-13 MED ORDER — SODIUM CHLORIDE 0.9 % IV SOLN
INTRAVENOUS | Status: DC
  Administered 2016-07-13: 12:00:00 via INTRAVENOUS

## 2016-07-13 NOTE — Progress Notes (Signed)
Patient is admitted to room 5C12 from ED. Family is at the bedside and patient is AOX4

## 2016-07-13 NOTE — ED Notes (Signed)
Patient CBG was 146. 

## 2016-07-13 NOTE — ED Triage Notes (Signed)
Per EMS, pt last known normal was 07/12/16 at 2000. Pt awoke this morning at 0500 and had a fall in the bathroom. Per pt's husband, she has new left sided facial droop, and weakness. Denies pain/tingling/numbness. BP-156/82, HR-62, CBG-155

## 2016-07-13 NOTE — Consult Note (Signed)
Initial Neurological Consultation                      NEURO HOSPITALIST CONSULT NOTE   Requestig physician: Dr. Madilyn Hook   Reason for Consult:  Question of possible facial droop. Difficulty with gait.   HPI:                                                                                                                                          Sierra Wright is an 82 y.o. female who reports that she got out of bed around 3 AM to go to the bathroom. When she did she appears to have slipped and fallen. She now presents with a complaint of some back pain but no focal neurological deficits. The family reports that she walks with a walker. They note she has a baseline unsteady gait.  Past Medical History:  Diagnosis Date  . Arthritis   . Coronary artery disease 1999   CABG  . Depression   . Diabetes mellitus without complication (HCC)    type II; metformin and Amaryl  . Diabetic neuropathy (HCC)   . History of anemia as a child   . HOH (hard of hearing)   . Hypertension   . Hypothyroidism    synthroid  . PONV (postoperative nausea and vomiting)   . Restless leg syndrome   . Shortness of breath 09/03/12   ECHO- The LA is Moderately Dilated, mild mitral annular calcification. mild pulmonary hypertension, moderate concentric LV hypertrophy. Atrial septum is aneurysmal. Aortic valve appears to be mildly sclerotic.EF 88%  . Urinary incontinence   . Weakness of both legs     Past Surgical History:  Procedure Laterality Date  . ABDOMINAL HYSTERECTOMY    . APPENDECTOMY    . BACK SURGERY    . CARDIAC CATHERIZATION  08/20/03   Widely patent bypass grafts. Normal left ventricular systolic function.  Marland Kitchen CARDIAC CATHETERIZATION    . CHOLECYSTECTOMY    . COLONOSCOPY    . CORONARY ARTERY BYPASS GRAFT    . EYE SURGERY     bilateral cataracts  . LUMBAR LAMINECTOMY/DECOMPRESSION MICRODISCECTOMY N/A 07/26/2015   Procedure: BILATERAL Lumbar 3-4 SUBTOTAL HEMILAMINECTOMY WITH LATERAL RECESS  DECOMPRESSION;  Surgeon: Kerrin Champagne, MD;  Location: [facility] OR;  Service: Orthopedics;  Laterality: N/A;  . SHOULDER SURGERY    . TUBAL LIGATION      MEDICATIONS:  I have reviewed the patient's current medications.  Allergies  Allergen Reactions  . Statins Other (See Comments)    Causes myalgias  . Amitriptyline Other (See Comments)    Pt. States that it caused it to be suicidal.   . Cena Benton Hcl] Other (See Comments)    Causes dizziness     Social History:  reports that she has never smoked. She has never used smokeless tobacco. She reports that she does not drink alcohol or use drugs.  Family History  Problem Relation Age of Onset  . Other Mother   . Heart attack Father      ROS:                                                                                                                                       History obtained from chart review  General ROS: negative for - chills, fatigue, fever, night sweats, weight gain or weight loss Psychological ROS: negative for - behavioral disorder, hallucinations, memory difficulties, mood swings or suicidal ideation Ophthalmic ROS: negative for - blurry vision, double vision, eye pain or loss of vision ENT ROS: negative for - epistaxis, nasal discharge, oral lesions, sore throat, tinnitus or vertigo Allergy and Immunology ROS: negative for - hives or itchy/watery eyes Hematological and Lymphatic ROS: negative for - bleeding problems, bruising or swollen lymph nodes Endocrine ROS: negative for - galactorrhea, hair pattern changes, polydipsia/polyuria or temperature intolerance Respiratory ROS: negative for - cough, hemoptysis, shortness of breath or wheezing Cardiovascular ROS: negative for - chest pain, dyspnea on exertion, edema or irregular heartbeat Gastrointestinal ROS: negative for - abdominal pain,  diarrhea, hematemesis, nausea/vomiting or stool incontinence Genito-Urinary ROS: negative for - dysuria, hematuria, incontinence or urinary frequency/urgency Musculoskeletal ROS: negative for - joint swelling or muscular weakness Neurological ROS: as noted in HPI Dermatological ROS: negative for rash and skin lesion changes   General Exam                                                                                                      Blood pressure 114/83, pulse 64, temperature 97.4 F (36.3 C), resp. rate 18, height 5\' 5"  (1.651 m), weight 90.7 kg (200 lb), SpO2 100 %. HEENT-  Normocephalic, no lesions, without obvious abnormality.  Normal external eye and conjunctiva.  Normal TM's bilaterally.  Normal auditory canals and external ears. Normal external nose, mucus membranes and septum.  Normal pharynx. Cardiovascular- regular rate and rhythm, S1, S2 normal, no murmur,  click, rub or gallop, pulses palpable throughout  evidence of prior sternotomy. Lungs- chest clear, no wheezing, rales, normal symmetric air entry, Heart exam - S1, S2 normal, no murmur, no gallop, rate regular Abdomen- soft, non-tender; bowel sounds normal; no masses,  no organomegaly Extremities- less then 2 second capillary refill arthritic changes are noted. Lymph-no adenopathy palpable Musculoskeletal-arthritic changes. Skin-warm and dry, no hyperpigmentation, vitiligo, or suspicious lesions  Neurological Examination Mental Status: Alert, oriented, thought content appropriate.  Speech fluent without evidence of aphasia.  Able to follow 3 step commands without difficulty. Cranial Nerves: II: Discs flat bilaterally; Visual fields grossly normal, pupils equal, round, reactive to light and accommodation III,IV, VI: ptosis not present, extra-ocular motions intact bilaterally V,VII: smile symmetric, facial light touch sensation normal bilaterally VIII: hearing normal bilaterally IX,X: uvula rises symmetrically XI:  bilateral shoulder shrug XII: midline tongue extension Motor: Right : Upper extremity   5/5    Left:     Upper extremity   5/5  Lower extremity   5/5     Lower extremity   5/5 Tone and bulk:normal tone throughout; no atrophy noted Sensory: Pinprick and light touch intact throughout, bilaterally Deep Tendon Reflexes: 2+ and symmetric throughout Plantars: Right: downgoing   Left: downgoing Cerebellar: normal finger-to-nose, normal rapid alternating movements and normal heel-to-shin test Gait: Antalgic      Lab Results: Basic Metabolic Panel:  Recent Labs Lab 07/13/16 0714  NA 135  K 3.9  CL 101  CO2 25  GLUCOSE 151*  BUN 21*  CREATININE 0.91  CALCIUM 9.4    Liver Function Tests:  Recent Labs Lab 07/13/16 0714  AST 18  ALT 19  ALKPHOS 52  BILITOT 0.9  PROT 5.6*  ALBUMIN 3.5   No results for input(s): LIPASE, AMYLASE in the last 168 hours. No results for input(s): AMMONIA in the last 168 hours.  CBC:  Recent Labs Lab 07/13/16 0714  WBC 9.5  NEUTROABS 6.4  HGB 13.0  HCT 39.1  MCV 93.3  PLT 163    Cardiac Enzymes: No results for input(s): CKTOTAL, CKMB, CKMBINDEX, TROPONINI in the last 168 hours.  Lipid Panel: No results for input(s): CHOL, TRIG, HDL, CHOLHDL, VLDL, LDLCALC in the last 168 hours.  CBG:  Recent Labs Lab 07/13/16 0720  GLUCAP 146*    Microbiology: Results for orders placed or performed during the hospital encounter of 07/08/15  Surgical pcr screen     Status: None   Collection Time: 07/08/15  9:55 AM  Result Value Ref Range Status   MRSA, PCR NEGATIVE NEGATIVE Final   Staphylococcus aureus NEGATIVE NEGATIVE Final    Comment:        The Xpert SA Assay (FDA approved for NASAL specimens in patients over 80 years of age), is one component of a comprehensive surveillance program.  Test performance has been validated by [facility] for patients greater than or equal to 80 year old. It is not intended to diagnose infection  nor to guide or monitor treatment.     Coagulation Studies:  Recent Labs  07/13/16 0714  LABPROT 13.2  INR 1.00    Imaging: Dg Ribs Unilateral W/chest Right  Result Date: 07/13/2016 CLINICAL DATA:  82 year old female with right-sided rib pain status post fall EXAM: RIGHT RIBS AND CHEST - 3+ VIEW COMPARISON:  Prior chest x-ray 04/24/2007 FINDINGS: No evidence of displaced rib fracture. Advanced degenerative osteoarthritis at the right glenohumeral joint. Patient is status post median sternotomy with evidence of prior multivessel CABG.  Surgical clips in the right upper quadrant suggest prior cholecystectomy. Stable mild chronic bronchitic change. No suspicious mass or nodule. IMPRESSION: 1. No evidence of acute rib fracture. 2. Advanced right glenohumeral joint osteoarthritis. Electronically Signed   By: Malachy Moan M.D.   On: 07/13/2016 08:04   Dg Forearm Left  Result Date: 07/13/2016 CLINICAL DATA:  Larey Seat this morning.  Left forearm pain. EXAM: LEFT FOREARM - 2 VIEW COMPARISON:  None. FINDINGS: Degenerative changes are noted at the wrist with settling of the scapholunate into the distal radius. There is also widening of the scapholunate joint space suggesting ligamentous insufficiency. The elbow joint is maintained. Small olecranon spur. No acute forearm fracture. IMPRESSION: No acute forearm fracture. Electronically Signed   By: Rudie Meyer M.D.   On: 07/13/2016 08:03   Ct Cervical Spine Wo Contrast  Result Date: 07/13/2016 CLINICAL DATA:  New onset dizziness. Patient fell upon waking at 5 a.m. today. EXAM: CT HEAD WITHOUT CONTRAST CT CERVICAL SPINE WITHOUT CONTRAST TECHNIQUE: Multidetector CT imaging of the head and cervical spine was performed following the standard protocol without intravenous contrast. Multiplanar CT image reconstructions of the cervical spine were also generated. COMPARISON:  CT head without contrast 05/27/2014. FINDINGS: CT HEAD FINDINGS Moderate subcortical  white matter hypoattenuation is again seen bilaterally without significant interval change. No acute infarct, hemorrhage, or mass lesion is present. The insular ribbon is within normal limits bilaterally. The basal ganglia are intact. No focal cortical infarct is evident. Mild generalized atrophy is present. The ventricles are of normal size. No significant extra-axial fluid collection is present. No significant extracranial soft tissue injury is present. The calvarium is intact. The paranasal sinuses and mastoid air cells are clear. Atherosclerotic calcifications are again noted at the cavernous internal carotid arteries at the dural margin of both vertebral arteries. Bilateral lens replacements are present. The globes and orbits are otherwise intact. CT CERVICAL SPINE FINDINGS The cervical spine is imaged and skull base through T1-2. Vertebral body heights and alignment are normal. No acute fracture traumatic subluxation is present. Multilevel facet degenerative change is present. This is most significant on the right at C3-4 and on the left at C4-5. Osseous foraminal narrowing is worse on the left at C4-5 and on the right at C3-4. IMPRESSION: 1. No acute intracranial abnormality or significant interval change. 2. Stable atrophy and white matter disease. This likely reflects the sequela of chronic microvascular ischemia. 3. Moderate spondylosis in the cervical spine as described. 4. No acute fracture or traumatic subluxation in the cervical spine. Electronically Signed   By: Marin Roberts M.D.   On: 07/13/2016 08:29   Dg Hand Complete Left  Result Date: 07/13/2016 CLINICAL DATA:  82 year old female with left thumb pain, bruising and swelling after falling earlier this morning EXAM: LEFT HAND - COMPLETE 3+ VIEW COMPARISON:  Concurrently obtained radiographs of the left forearm FINDINGS: No evidence of acute fracture or malalignment. Degenerative osteoarthritis is present at the first carpometacarpal  joint and throughout the distal interphalangeal joints and the third fourth and fifth proximal interphalangeal joint. Degenerative changes are quite pronounced at the proximal interphalangeal joints of the digits 4 and 5 with at least partial bony ankylosis. The bones appear diffusely demineralized. Mild degenerative change at the STT joint. IMPRESSION: No convincing evidence of acute fracture. Advanced multifocal degenerative osteoarthritis including at the STT and first [facility] joint in the region of the patient's clinical symptoms. Electronically Signed   By: Malachy Moan M.D.   On: 07/13/2016 08:02  Ct Head Code Stroke W/o Cm  Result Date: 07/13/2016 CLINICAL DATA:  New onset dizziness. Patient fell upon waking at 5 a.m. today. EXAM: CT HEAD WITHOUT CONTRAST CT CERVICAL SPINE WITHOUT CONTRAST TECHNIQUE: Multidetector CT imaging of the head and cervical spine was performed following the standard protocol without intravenous contrast. Multiplanar CT image reconstructions of the cervical spine were also generated. COMPARISON:  CT head without contrast 05/27/2014. FINDINGS: CT HEAD FINDINGS Moderate subcortical white matter hypoattenuation is again seen bilaterally without significant interval change. No acute infarct, hemorrhage, or mass lesion is present. The insular ribbon is within normal limits bilaterally. The basal ganglia are intact. No focal cortical infarct is evident. Mild generalized atrophy is present. The ventricles are of normal size. No significant extra-axial fluid collection is present. No significant extracranial soft tissue injury is present. The calvarium is intact. The paranasal sinuses and mastoid air cells are clear. Atherosclerotic calcifications are again noted at the cavernous internal carotid arteries at the dural margin of both vertebral arteries. Bilateral lens replacements are present. The globes and orbits are otherwise intact. CT CERVICAL SPINE FINDINGS The cervical spine is  imaged and skull base through T1-2. Vertebral body heights and alignment are normal. No acute fracture traumatic subluxation is present. Multilevel facet degenerative change is present. This is most significant on the right at C3-4 and on the left at C4-5. Osseous foraminal narrowing is worse on the left at C4-5 and on the right at C3-4. IMPRESSION: 1. No acute intracranial abnormality or significant interval change. 2. Stable atrophy and white matter disease. This likely reflects the sequela of chronic microvascular ischemia. 3. Moderate spondylosis in the cervical spine as described. 4. No acute fracture or traumatic subluxation in the cervical spine. Electronically Signed   By: Marin Roberts M.D.   On: 07/13/2016 08:29    Assessment/Plan:  Caeley is a lovely 82 year old patient she presents today after falling down while getting out of bed around 3:00 in the morning. She has a known history of back pain and prior surgery on the lumbar spine. She has a history of unsteady gait and uses a 4 post walker. She has undergone several different imaging studies to rule out any fractures. She continues to complain of some back pain.  The neurological exam is normal there is no focal weakness cranial nerve exam is also normal. She was disconnected from her monitors and was able to get up and walk around her bed with assistance.  At this point there is no evidence of any ischemic change. There is no facial asymmetry. It would be reasonable to check additional imaging of the lumbar and possibly lower thoracic spine. If these are unremarkable Rileyann could be discharged to home.  Plan:  1. Consider imaging of lower thoracic and lumbar spine.  2. If these are normal Kenndra could be discharged to home.  3. No need for MRI of brain at this time.    James A. Hilda Blades, M.D. Neurohospitalist Phone: [phone]  07/13/2016, 9:31 AM

## 2016-07-13 NOTE — ED Provider Notes (Signed)
[facility]-EMERGENCY DEPT Provider Note   CSN: [id number] Arrival date & time: 07/13/16  [id number]  First Provider Contact: 07/13/2016 7:08 AM      History   Chief Complaint Chief Complaint  Patient presents with  . Fall    HPI Sierra Wright is a 82 y.o. female brought in by EMS for fall. History is given by the patient and her husband. She is a poor historian. According to the patient's husband. They both went to bed and weren't in a normal state of health around 8 PM last night. At 3 AM he heard her get up to the bathroom and fall on the floor. He was unable to help her up. The patient states that she is feeling very off balance. She has a history of poor vision in the right eye. She is able to see well with her left eye. She states that when she stands up, she just "can't walk." She is complaining of some pain in the right posterolateral rib cage, it is her left thumb which is bruised and swollen. She denies any hip pain or back pain. She also complains of some left upper extremity pain. She had a previous fracture. She did not hit her head or lose consciousness. Patient does not take any blood thinners.  HPI  Past Medical History:  Diagnosis Date  . Arthritis   . Coronary artery disease 1999   CABG  . Depression   . Diabetes mellitus without complication (HCC)    type II; metformin and Amaryl  . Diabetic neuropathy (HCC)   . History of anemia as a child   . HOH (hard of hearing)   . Hypertension   . Hypothyroidism    synthroid  . PONV (postoperative nausea and vomiting)   . Restless leg syndrome   . Shortness of breath 09/03/12   ECHO- The LA is Moderately Dilated, mild mitral annular calcification. mild pulmonary hypertension, moderate concentric LV hypertrophy. Atrial septum is aneurysmal. Aortic valve appears to be mildly sclerotic.EF 88%  . Urinary incontinence   . Weakness of both legs     Patient Active Problem List   Diagnosis Date Noted  . Chronic diastolic CHF  (congestive heart failure) (HCC) 04/16/2016  . Osteoporosis 07/27/2015    Class: Chronic  . Spinal stenosis, lumbar region, with neurogenic claudication 07/26/2015    Class: Chronic  . Spondylolisthesis of lumbar region 07/26/2015    Class: Chronic  . Hx of CABG 03/15/2014  . Osteoarthritis 03/15/2014  . Type II or unspecified type diabetes mellitus without mention of complication, uncontrolled 03/15/2014  . Peripheral edema 03/15/2014  . Benign hypertensive heart disease without heart failure 03/15/2014  . Right bundle branch block 03/15/2014  . First degree AV block 03/15/2014  . Sinus bradycardia by electrocardiogram 03/15/2014  . Dizziness 03/15/2014    Past Surgical History:  Procedure Laterality Date  . ABDOMINAL HYSTERECTOMY    . APPENDECTOMY    . BACK SURGERY    . CARDIAC CATHERIZATION  08/20/03   Widely patent bypass grafts. Normal left ventricular systolic function.  Marland Kitchen. CARDIAC CATHETERIZATION    . CHOLECYSTECTOMY    . COLONOSCOPY    . CORONARY ARTERY BYPASS GRAFT    . EYE SURGERY     bilateral cataracts  . LUMBAR LAMINECTOMY/DECOMPRESSION MICRODISCECTOMY N/A 07/26/2015   Procedure: BILATERAL Lumbar 3-4 SUBTOTAL HEMILAMINECTOMY WITH LATERAL RECESS DECOMPRESSION;  Surgeon: Kerrin ChampagneJames E Nitka, MD;  Location: [facility] OR;  Service: Orthopedics;  Laterality: N/A;  . SHOULDER SURGERY    .  TUBAL LIGATION      OB History    No data available       Home Medications    Prior to Admission medications   Medication Sig Start Date End Date Taking? Authorizing Provider  amLODipine (NORVASC) 5 MG tablet Take 1 tablet (5 mg total) by mouth daily. 05/07/16   Vesta Mixer, MD  Ascorbic Acid (VITAMIN C ER PO) Take 1,000 mg by mouth daily.     Historical Provider, MD  aspirin 81 MG tablet Take 81 mg by mouth daily.    Historical Provider, MD  benazepril (LOTENSIN) 40 MG tablet Take 1 tablet (40 mg total) by mouth daily. 07/09/16   Vesta Mixer, MD  Calcium Carb-Cholecalciferol (CALCIUM  1000 + D PO) Take 1,000 mg by mouth 2 (two) times daily.     Historical Provider, MD  diazepam (VALIUM) 10 MG tablet Take 10 mg by mouth every 6 (six) hours as needed for anxiety.    Historical Provider, MD  fish oil-omega-3 fatty acids 1000 MG capsule Take 1-2 g by mouth 3 (three) times daily. 1 capsule morning and evening , 2 capsules at lunch    Historical Provider, MD  furosemide (LASIX) 40 MG tablet TAKE ONE TABLET BY MOUTH TWICE DAILY. Patient taking differently: TAKE ONE TABLET BY MOUTH  DAILY. 12/13/15   Cassell Clement, MD  Garlic 1000 MG CAPS Take 1,000 mg by mouth daily.     Historical Provider, MD  glimepiride (AMARYL) 4 MG tablet Take 4 mg by mouth daily with breakfast.     Historical Provider, MD  hydrALAZINE (APRESOLINE) 25 MG tablet TAKE ONE TABLET BY MOUTH THREE TIMES DAILY 10/13/15   Cassell Clement, MD  levothyroxine (SYNTHROID, LEVOTHROID) 112 MCG tablet Take 112 mcg by mouth daily before breakfast.    Historical Provider, MD  metFORMIN (GLUCOPHAGE) 500 MG tablet Take 500-1,000 mg by mouth 2 (two) times daily with a meal. 2 tablets in the morning, and  In the evening    Historical Provider, MD  metoprolol tartrate (LOPRESSOR) 25 MG tablet Take 1 tablet (25 mg total) by mouth 2 (two) times daily. 07/09/16   Vesta Mixer, MD  potassium chloride (K-DUR,KLOR-CON) 10 MEQ tablet Take 10 mEq by mouth 4 (four) times daily. 03/15/14   Cassell Clement, MD  vitamin B-12 (CYANOCOBALAMIN) 1000 MCG tablet Take 1,000 mcg by mouth daily.    Historical Provider, MD    Family History Family History  Problem Relation Age of Onset  . Other Mother   . Heart attack Father     Social History Social History  Substance Use Topics  . Smoking status: Never Smoker  . Smokeless tobacco: Never Used  . Alcohol use No     Allergies   Statins; Amitriptyline; and Welchol [colesevelam hcl]   Review of Systems Review of Systems  Ten systems reviewed and are negative for acute change, except  as noted in the HPI.   Physical Exam Updated Vital Signs BP 186/69   Pulse 68   Temp 97.4 F (36.3 C) (Oral)   Resp 21   Ht  (1.651 m)   Wt 90.7 kg   SpO2 100%   BMI 33.28 kg/m   Physical Exam  Constitutional: She is oriented to person, place, and time. She appears well-developed and well-nourished. No distress.  HENT:  Head: Normocephalic and atraumatic.  Eyes: Conjunctivae and EOM are normal. Pupils are equal, round, and reactive to light. No scleral icterus.  R pupil  deformity Diplopia with binocular vision only. Visual fields full to confrontation in the Left eye.  Neck: Normal range of motion.  Cardiovascular: Normal rate, regular rhythm and normal heart sounds.  Exam reveals no gallop and no friction rub.   No murmur heard. Pulmonary/Chest: Effort normal and breath sounds normal. No respiratory distress. She exhibits tenderness.  Abdominal: Soft. Bowel sounds are normal. She exhibits no distension and no mass. There is no tenderness. There is no guarding.  Musculoskeletal: She exhibits edema and tenderness.  Left upper extremity tender to palpation in the mid humerus without erythema or deformity. Left thumb is erythematous, swollen and decreased range of motion.    Neurological: She is alert and oriented to person, place, and time.  Speech is clear and goal oriented, follows commands Major Cranial nerves without deficit, EXCEPT VISUAL DEFICITS AS STATED and R PTOSIS Normal strength in upper and lower extremities bilaterally including dorsiflexion and plantar flexion, strong and equal grip strength Sensation normal to light and sharp touch Moves extremities without ataxia, coordination intact No pronator drift Patient is unsteady and ataxic. Unable to stand without 2 person support.  Skin: Skin is warm and dry. She is not diaphoretic.  Nursing note and vitals reviewed.    ED Treatments / Results  Labs (all labs ordered are listed, but only abnormal results are  displayed) Labs Reviewed - No data to display  EKG  EKG Interpretation  Date/Time:  Friday July 13 2016 06:42:45 EDT Ventricular Rate:  63 PR Interval:    QRS Duration: 170 QT Interval:  478 QTC Calculation: 490 R Axis:   63 Text Interpretation:  Sinus rhythm Prolonged PR interval Right bundle branch block Confirmed by Lincoln Brigham [id number] on 07/13/2016 6:45:50 AM       Radiology No results found.  Procedures Procedures (including critical care time)  Medications Ordered in ED Medications - No data to display   Initial Impression / Assessment and Plan / ED Course  I have reviewed the triage vital signs and the nursing notes.  Pertinent labs & imaging results that were available during my care of the patient were reviewed by me and considered in my medical decision making (see chart for details).  Clinical Course  Comment By Time  Patient last seen normal at 8:00 PM last night therefore a code stroke was not initiated. Arthor Captain, PA-C 08/11 6045   Patient sxs resolved.  She will be admitted for TIA workup.  Final Clinical Impressions(s) / ED Diagnoses   Final diagnoses:  Binocular vision disorder with diplopia  Ataxia  Ptosis of eyelid, right  Weakness  Weakness    New Prescriptions New Prescriptions   No medications on file     Arthor Captain, PA-C 07/13/16 1634    Tilden Fossa, MD 07/14/16 [phone]

## 2016-07-13 NOTE — ED Notes (Addendum)
Pt a&o x4. Stroke screening neuro deficits include: minor right leg drift, possibly d/t pain in right hip joint w/ movement. No weakness in any extremities. Pt states "pinprick in right leg is a little less compared to left leg." No other sensory deficits. Hx blindness in right eye. Passed stroke swallow screen. CT negative for stroke. Has not had MRI yet.

## 2016-07-13 NOTE — H&P (Signed)
History and Physical  Sierra Wright ZOX:[id number] DOB: 01-07-1934 DOA: 07/13/2016  Referring physician: ER Physician PCP: Emeterio Reeve, MD  Outpatient Specialists:    Patient coming from: Home  Chief Complaint: Dizziness  HPI: 82 year old female with history of weakness, restless leg syndrome, , CAD, HTN and DM. Patient presents with dizziness that started earlier today with reported double vision. On further questioning, patient described the dizziness as weakness. Patient has been for a while. The double vision has resolved according to the patient. Patient was seen by Dr. Hilda Blades, the Neurologist (Kindly see the documentation). MRI of throax and lumbar spine has been advised by the Neurologist. Patient is back to her baseline. No headache, no neck pain, no chest pain, no GI symptoms and no urinary symptoms.  ED Course: CT head.   Pertinent labs: EKG: Independently reviewed.  Imaging: independently reviewed.   Review of Systems:  As in HPI. Negative for fever, visual changes, sore throat, rash, new muscle aches, chest pain, SOB, dysuria, bleeding, n/v/abdominal pain.  Past Medical History:  Diagnosis Date  . Arthritis   . Coronary artery disease 1999   CABG  . Depression   . Diabetes mellitus without complication (HCC)    type II; metformin and Amaryl  . Diabetic neuropathy (HCC)   . History of anemia as a child   . HOH (hard of hearing)   . Hypertension   . Hypothyroidism    synthroid  . PONV (postoperative nausea and vomiting)   . Restless leg syndrome   . Shortness of breath 09/03/12   ECHO- The LA is Moderately Dilated, mild mitral annular calcification. mild pulmonary hypertension, moderate concentric LV hypertrophy. Atrial septum is aneurysmal. Aortic valve appears to be mildly sclerotic.EF 88%  . Urinary incontinence   . Weakness of both legs     Past Surgical History:  Procedure Laterality Date  . ABDOMINAL HYSTERECTOMY    . APPENDECTOMY    . BACK  SURGERY    . CARDIAC CATHERIZATION  08/20/03   Widely patent bypass grafts. Normal left ventricular systolic function.  Marland Kitchen CARDIAC CATHETERIZATION    . CHOLECYSTECTOMY    . COLONOSCOPY    . CORONARY ARTERY BYPASS GRAFT    . EYE SURGERY     bilateral cataracts  . LUMBAR LAMINECTOMY/DECOMPRESSION MICRODISCECTOMY N/A 07/26/2015   Procedure: BILATERAL Lumbar 3-4 SUBTOTAL HEMILAMINECTOMY WITH LATERAL RECESS DECOMPRESSION;  Surgeon: Kerrin Champagne, MD;  Location: [facility] OR;  Service: Orthopedics;  Laterality: N/A;  . SHOULDER SURGERY    . TUBAL LIGATION       reports that she has never smoked. She has never used smokeless tobacco. She reports that she does not drink alcohol or use drugs.  Allergies  Allergen Reactions  . Statins Other (See Comments)    Causes myalgias  . Amitriptyline Other (See Comments)    Pt. States that it caused it to be suicidal.   . Cena Benton Hcl] Other (See Comments)    Causes dizziness    Family History  Problem Relation Age of Onset  . Other Mother   . Heart attack Father      Prior to Admission medications   Medication Sig Start Date End Date Taking? Authorizing Provider  amLODipine (NORVASC) 5 MG tablet Take 1 tablet (5 mg total) by mouth daily. 05/07/16  Yes Vesta Mixer, MD  Ascorbic Acid (VITAMIN C ER PO) Take 1,000 mg by mouth daily.    Yes Historical Provider, MD  aspirin 81 MG  tablet Take 81 mg by mouth daily.   Yes Historical Provider, MD  benazepril (LOTENSIN) 40 MG tablet Take 1 tablet (40 mg total) by mouth daily. 07/09/16  Yes Vesta Mixer, MD  Calcium Carb-Cholecalciferol (CALCIUM 600/VITAMIN D3) 600-800 MG-UNIT TABS Take 1 tablet by mouth 2 (two) times daily.   Yes Historical Provider, MD  diazepam (VALIUM) 10 MG tablet Take 10 mg by mouth every 6 (six) hours as needed for anxiety.   Yes Historical Provider, MD  fish oil-omega-3 fatty acids 1000 MG capsule Take 1-2 g by mouth 3 (three) times daily. 1 capsule morning and evening , 2  capsules at lunch   Yes Historical Provider, MD  furosemide (LASIX) 40 MG tablet TAKE ONE TABLET BY MOUTH TWICE DAILY. Patient taking differently: TAKE ONE TABLET BY MOUTH  DAILY. 12/13/15  Yes Cassell Clement, MD  Garlic 1000 MG CAPS Take 1,000 mg by mouth daily.    Yes Historical Provider, MD  glimepiride (AMARYL) 4 MG tablet Take 4 mg by mouth daily with breakfast.    Yes Historical Provider, MD  hydrALAZINE (APRESOLINE) 25 MG tablet TAKE ONE TABLET BY MOUTH THREE TIMES DAILY 10/13/15  Yes Cassell Clement, MD  levothyroxine (SYNTHROID, LEVOTHROID) 112 MCG tablet Take 112 mcg by mouth daily before breakfast.   Yes Historical Provider, MD  metFORMIN (GLUCOPHAGE) 500 MG tablet Take 500 mg by mouth 3 (three) times daily.    Yes Historical Provider, MD  metoprolol tartrate (LOPRESSOR) 25 MG tablet Take 1 tablet (25 mg total) by mouth 2 (two) times daily. 07/09/16  Yes Vesta Mixer, MD  potassium chloride (K-DUR,KLOR-CON) 10 MEQ tablet Take 10 mEq by mouth 4 (four) times daily. 03/15/14  Yes Cassell Clement, MD  vitamin B-12 (CYANOCOBALAMIN) 1000 MCG tablet Take 1,000 mcg by mouth daily.   Yes Historical Provider, MD    Physical Exam: Vitals:   07/13/16 0903 07/13/16 0915 07/13/16 0930 07/13/16 1028  BP:  165/65 165/55 (!) 164/65  Pulse:  63 65 66  Resp:  25 16   Temp: 97.4 F (36.3 C)   98.1 F (36.7 C)  TempSrc:    Oral  SpO2:  100% 100% 98%  Weight:      Height:        Constitutional:  . Appears calm and comfortable Eyes:  . No pallor. No jaundice.  ENMT:  . external ears, nose appear normal Neck:  . Neck is supple. No JVD Respiratory:  . CTA bilaterally, no w/r/r.  . Respiratory effort normal. No retractions or accessory muscle use Cardiovascular:  . S1S2 . No LE extremity edema   Abdomen:  . Abdomen is soft and non tender. Organs are difficult to assess. Neurologic:  . Awake and alert. . Moves all limbs.  Wt Readings from Last 3 Encounters:  07/13/16 90.7 kg (200  lb)  04/16/16 89.8 kg (198 lb)  10/10/15 93 kg (205 lb 1.9 oz)    I have personally reviewed following labs and imaging studies  Labs on Admission:  CBC:  Recent Labs Lab 07/13/16 0714 07/13/16 0724  WBC 9.5  --   NEUTROABS 6.4  --   HGB 13.0 13.6  HCT 39.1 40.0  MCV 93.3  --   PLT 163  --    Basic Metabolic Panel:  Recent Labs Lab 07/13/16 0714 07/13/16 0724  NA 135 136  K 3.9 3.9  CL 101 100*  CO2 25  --   GLUCOSE 151* 145*  BUN 21* 24*  CREATININE 0.91 0.90  CALCIUM 9.4  --    Liver Function Tests:  Recent Labs Lab 07/13/16 0714  AST 18  ALT 19  ALKPHOS 52  BILITOT 0.9  PROT 5.6*  ALBUMIN 3.5   No results for input(s): LIPASE, AMYLASE in the last 168 hours. No results for input(s): AMMONIA in the last 168 hours. Coagulation Profile:  Recent Labs Lab 07/13/16 0714  INR 1.00   Cardiac Enzymes: No results for input(s): CKTOTAL, CKMB, CKMBINDEX, TROPONINI in the last 168 hours. BNP (last 3 results) No results for input(s): PROBNP in the last 8760 hours. HbA1C: No results for input(s): HGBA1C in the last 72 hours. CBG:  Recent Labs Lab 07/13/16 0720  GLUCAP 146*   Lipid Profile: No results for input(s): CHOL, HDL, LDLCALC, TRIG, CHOLHDL, LDLDIRECT in the last 72 hours. Thyroid Function Tests: No results for input(s): TSH, T4TOTAL, FREET4, T3FREE, THYROIDAB in the last 72 hours. Anemia Panel: No results for input(s): VITAMINB12, FOLATE, FERRITIN, TIBC, IRON, RETICCTPCT in the last 72 hours. Urine analysis: No results found for: COLORURINE, APPEARANCEUR, LABSPEC, PHURINE, GLUCOSEU, HGBUR, BILIRUBINUR, KETONESUR, PROTEINUR, UROBILINOGEN, NITRITE, LEUKOCYTESUR Sepsis Labs: @LABRCNTIP (procalcitonin:4,lacticidven:4) )No results found for this or any previous visit (from the past 240 hour(s)).    Radiological Exams on Admission: Dg Ribs Unilateral W/chest Right  Result Date: 07/13/2016 CLINICAL DATA:  82 year old female with right-sided rib  pain status post fall EXAM: RIGHT RIBS AND CHEST - 3+ VIEW COMPARISON:  Prior chest x-ray 04/24/2007 FINDINGS: No evidence of displaced rib fracture. Advanced degenerative osteoarthritis at the right glenohumeral joint. Patient is status post median sternotomy with evidence of prior multivessel CABG. Surgical clips in the right upper quadrant suggest prior cholecystectomy. Stable mild chronic bronchitic change. No suspicious mass or nodule. IMPRESSION: 1. No evidence of acute rib fracture. 2. Advanced right glenohumeral joint osteoarthritis. Electronically Signed   By: Malachy MoanHeath  McCullough M.D.   On: 07/13/2016 08:04   Dg Forearm Left  Result Date: 07/13/2016 CLINICAL DATA:  Larey SeatFell this morning.  Left forearm pain. EXAM: LEFT FOREARM - 2 VIEW COMPARISON:  None. FINDINGS: Degenerative changes are noted at the wrist with settling of the scapholunate into the distal radius. There is also widening of the scapholunate joint space suggesting ligamentous insufficiency. The elbow joint is maintained. Small olecranon spur. No acute forearm fracture. IMPRESSION: No acute forearm fracture. Electronically Signed   By: Rudie MeyerP.  Gallerani M.D.   On: 07/13/2016 08:03   Ct Cervical Spine Wo Contrast  Result Date: 07/13/2016 CLINICAL DATA:  New onset dizziness. Patient fell upon waking at 5 a.m. today. EXAM: CT HEAD WITHOUT CONTRAST CT CERVICAL SPINE WITHOUT CONTRAST TECHNIQUE: Multidetector CT imaging of the head and cervical spine was performed following the standard protocol without intravenous contrast. Multiplanar CT image reconstructions of the cervical spine were also generated. COMPARISON:  CT head without contrast 05/27/2014. FINDINGS: CT HEAD FINDINGS Moderate subcortical white matter hypoattenuation is again seen bilaterally without significant interval change. No acute infarct, hemorrhage, or mass lesion is present. The insular ribbon is within normal limits bilaterally. The basal ganglia are intact. No focal cortical  infarct is evident. Mild generalized atrophy is present. The ventricles are of normal size. No significant extra-axial fluid collection is present. No significant extracranial soft tissue injury is present. The calvarium is intact. The paranasal sinuses and mastoid air cells are clear. Atherosclerotic calcifications are again noted at the cavernous internal carotid arteries at the dural margin of both vertebral arteries. Bilateral lens replacements are present. The  globes and orbits are otherwise intact. CT CERVICAL SPINE FINDINGS The cervical spine is imaged and skull base through T1-2. Vertebral body heights and alignment are normal. No acute fracture traumatic subluxation is present. Multilevel facet degenerative change is present. This is most significant on the right at C3-4 and on the left at C4-5. Osseous foraminal narrowing is worse on the left at C4-5 and on the right at C3-4. IMPRESSION: 1. No acute intracranial abnormality or significant interval change. 2. Stable atrophy and white matter disease. This likely reflects the sequela of chronic microvascular ischemia. 3. Moderate spondylosis in the cervical spine as described. 4. No acute fracture or traumatic subluxation in the cervical spine. Electronically Signed   By: Marin Roberts M.D.   On: 07/13/2016 08:29   Dg Hand Complete Left  Result Date: 07/13/2016 CLINICAL DATA:  82 year old female with left thumb pain, bruising and swelling after falling earlier this morning EXAM: LEFT HAND - COMPLETE 3+ VIEW COMPARISON:  Concurrently obtained radiographs of the left forearm FINDINGS: No evidence of acute fracture or malalignment. Degenerative osteoarthritis is present at the first carpometacarpal joint and throughout the distal interphalangeal joints and the third fourth and fifth proximal interphalangeal joint. Degenerative changes are quite pronounced at the proximal interphalangeal joints of the digits 4 and 5 with at least partial bony  ankylosis. The bones appear diffusely demineralized. Mild degenerative change at the STT joint. IMPRESSION: No convincing evidence of acute fracture. Advanced multifocal degenerative osteoarthritis including at the STT and first [facility] joint in the region of the patient's clinical symptoms. Electronically Signed   By: Malachy Moan M.D.   On: 07/13/2016 08:02   Ct Head Code Stroke W/o Cm  Result Date: 07/13/2016 CLINICAL DATA:  New onset dizziness. Patient fell upon waking at 5 a.m. today. EXAM: CT HEAD WITHOUT CONTRAST CT CERVICAL SPINE WITHOUT CONTRAST TECHNIQUE: Multidetector CT imaging of the head and cervical spine was performed following the standard protocol without intravenous contrast. Multiplanar CT image reconstructions of the cervical spine were also generated. COMPARISON:  CT head without contrast 05/27/2014. FINDINGS: CT HEAD FINDINGS Moderate subcortical white matter hypoattenuation is again seen bilaterally without significant interval change. No acute infarct, hemorrhage, or mass lesion is present. The insular ribbon is within normal limits bilaterally. The basal ganglia are intact. No focal cortical infarct is evident. Mild generalized atrophy is present. The ventricles are of normal size. No significant extra-axial fluid collection is present. No significant extracranial soft tissue injury is present. The calvarium is intact. The paranasal sinuses and mastoid air cells are clear. Atherosclerotic calcifications are again noted at the cavernous internal carotid arteries at the dural margin of both vertebral arteries. Bilateral lens replacements are present. The globes and orbits are otherwise intact. CT CERVICAL SPINE FINDINGS The cervical spine is imaged and skull base through T1-2. Vertebral body heights and alignment are normal. No acute fracture traumatic subluxation is present. Multilevel facet degenerative change is present. This is most significant on the right at C3-4 and on the left at  C4-5. Osseous foraminal narrowing is worse on the left at C4-5 and on the right at C3-4. IMPRESSION: 1. No acute intracranial abnormality or significant interval change. 2. Stable atrophy and white matter disease. This likely reflects the sequela of chronic microvascular ischemia. 3. Moderate spondylosis in the cervical spine as described. 4. No acute fracture or traumatic subluxation in the cervical spine. Electronically Signed   By: Marin Roberts M.D.   On: 07/13/2016 08:29    Active  Problems:   Binocular vision disorder with diplopia   Weakness   Assessment/Plan 1. Weakness 2. Rule out TIA 3. History of DM and HTN   Place patient on observation  Neurology has already seen the patient and recommended MRI Thorax and lumbar  Aspirin  ECHO  Carotid doppler ultrasound  ER Provider has ordered MRI brain etc  PT/OT consult  Optimize Blood sugar and Blood pressure  Further management will depend on hospital course  DVT prophylaxis: [place] Lovenox Code Status: Full Family Communication: Husband, Daughter and son in law Disposition Plan: To be determined   Consults called: ER has already consulted Neurology   Admission status: Observation    Time spent: Greater than 60 minutes  Berton Mount, MD  [facility] Pager #: [phone] 7PM-7AM contact night coverage as above   07/13/2016, 11:03 AM

## 2016-07-13 NOTE — Evaluation (Signed)
Physical Therapy Evaluation Patient Details Name: Sierra Wright MRN: [id number] DOB: 03/25/1934 Today's Date: 07/13/2016   History of Present Illness  82 year old female with history of weakness, restless leg syndrome, , CAD, HTN and DM. Patient presents with dizziness that started earlier today with reported double vision.  Clinical Impression  Patient presents with decreased independence with mobility due to deficits listed in PT problem list.  She will benefit from skilled PT in the acute setting to allow return home with spouse assist and follow up outpatient PT.     Follow Up Recommendations Outpatient PT;Supervision/Assistance - 24 hour (has been to [facility] in the past)    Equipment Recommendations  None recommended by PT    Recommendations for Other Services       Precautions / Restrictions Precautions Precautions: Fall Precaution Comments: Reports fell when dizzy prior to admission and sore L arm, no other recent falls      Mobility  Bed Mobility Overal bed mobility: Needs Assistance Bed Mobility: Supine to Sit     Supine to sit: Min assist     General bed mobility comments: to scoot to EOB  Transfers Overall transfer level: Needs assistance Equipment used: Rolling walker (2 wheeled) Transfers: Sit to/from Stand Sit to Stand: Min assist         General transfer comment: increased time, heavy UE support  Ambulation/Gait Ambulation/Gait assistance: Min assist Ambulation Distance (Feet): 12 Feet (x 2) Assistive device: Rolling walker (2 wheeled) Gait Pattern/deviations: Step-to pattern;Trunk flexed;Shuffle;Decreased stride length     General Gait Details: heavy UE reliance for ambulation to/from bathroom, refused further ambulation (head still not right)  Stairs            Wheelchair Mobility    Modified Rankin (Stroke Patients Only)       Balance Overall balance assessment: Needs assistance Sitting-balance support: Feet  unsupported Sitting balance-Leahy Scale: Fair     Standing balance support: Bilateral upper extremity supported Standing balance-Leahy Scale: Poor Standing balance comment: UE support needed for balance, assist for hygiene after toileting                             Pertinent Vitals/Pain Pain Assessment: Faces Faces Pain Scale: Hurts even more Pain Location: L arm with pushing up to stand Pain Descriptors / Indicators: Sore Pain Intervention(s): Monitored during session;Limited activity within patient's tolerance;Repositioned    Home Living Family/patient expects to be discharged to:: Private residence Living Arrangements: Spouse/significant other Available Help at Discharge: Family;Available 24 hours/day Type of Home: House Home Access: Ramped entrance     Home Layout: One level Home Equipment: Walker - 2 wheels;Bedside commode;Wheelchair - manual      Prior Function Level of Independence: Independent with assistive device(s)         Comments: sponge bathes, walks short distance in house with RW     Hand Dominance   Dominant Hand: Right    Extremity/Trunk Assessment   Upper Extremity Assessment: Defer to OT evaluation           Lower Extremity Assessment: Generalized weakness      Cervical / Trunk Assessment: Kyphotic  Communication   Communication: No difficulties  Cognition Arousal/Alertness: Awake/alert Behavior During Therapy: Flat affect Overall Cognitive Status: History of cognitive impairments - at baseline                      General Comments General comments (skin  integrity, edema, etc.): bed soiled with urine, but pt asking for assist to go to the bathroom    Exercises        Assessment/Plan    PT Assessment Patient needs continued PT services  PT Diagnosis Generalized weakness;Abnormality of gait;Difficulty walking   PT Problem List Decreased strength;Decreased mobility;Decreased safety awareness;Decreased  balance;Decreased activity tolerance;Decreased knowledge of use of DME  PT Treatment Interventions DME instruction;Gait training;Functional mobility training;Balance training;Therapeutic exercise;Therapeutic activities;Patient/family education   PT Goals (Current goals can be found in the Care Plan section) Acute Rehab PT Goals Patient Stated Goal: To go to outpatient PT PT Goal Formulation: With patient/family Time For Goal Achievement: 07/20/16 Potential to Achieve Goals: Good    Frequency Min 3X/week   Barriers to discharge        Co-evaluation               End of Session Equipment Utilized During Treatment: Gait belt Activity Tolerance: Patient limited by fatigue Patient left: with call bell/phone within reach;in bed;with family/visitor present;with bed alarm set Nurse Communication: Mobility status    Functional Assessment Tool Used: Clinical Judgement Functional Limitation: Mobility: Walking and moving around Mobility: Walking and Moving Around Current Status (O1308): At least 20 percent but less than 40 percent impaired, limited or restricted Mobility: Walking and Moving Around Goal Status [phone]): At least 20 percent but less than 40 percent impaired, limited or restricted    Time: 1450-1515 PT Time Calculation (min) (ACUTE ONLY): 25 min   Charges:   PT Evaluation $PT Eval Moderate Complexity: 1 Procedure PT Treatments $Gait Training: 8-22 mins   PT G Codes:   PT G-Codes **NOT FOR INPATIENT CLASS** Functional Assessment Tool Used: Clinical Judgement Functional Limitation: Mobility: Walking and moving around Mobility: Walking and Moving Around Current Status (O9629): At least 20 percent but less than 40 percent impaired, limited or restricted Mobility: Walking and Moving Around Goal Status [phone]): At least 20 percent but less than 40 percent impaired, limited or restricted    Sierra Wright 07/13/2016, 4:01 PM  Sheran Lawless, PT [phone] 07/13/2016

## 2016-07-14 ENCOUNTER — Observation Stay (HOSPITAL_COMMUNITY): Payer: Medicare Other

## 2016-07-14 ENCOUNTER — Observation Stay (HOSPITAL_BASED_OUTPATIENT_CLINIC_OR_DEPARTMENT_OTHER): Payer: Medicare Other

## 2016-07-14 DIAGNOSIS — R270 Ataxia, unspecified: Secondary | ICD-10-CM | POA: Diagnosis present

## 2016-07-14 DIAGNOSIS — I2510 Atherosclerotic heart disease of native coronary artery without angina pectoris: Secondary | ICD-10-CM | POA: Diagnosis present

## 2016-07-14 DIAGNOSIS — I441 Atrioventricular block, second degree: Secondary | ICD-10-CM

## 2016-07-14 DIAGNOSIS — Z951 Presence of aortocoronary bypass graft: Secondary | ICD-10-CM | POA: Diagnosis not present

## 2016-07-14 DIAGNOSIS — H532 Diplopia: Secondary | ICD-10-CM | POA: Diagnosis present

## 2016-07-14 DIAGNOSIS — Z7982 Long term (current) use of aspirin: Secondary | ICD-10-CM | POA: Diagnosis not present

## 2016-07-14 DIAGNOSIS — Z981 Arthrodesis status: Secondary | ICD-10-CM | POA: Diagnosis not present

## 2016-07-14 DIAGNOSIS — G459 Transient cerebral ischemic attack, unspecified: Secondary | ICD-10-CM | POA: Diagnosis not present

## 2016-07-14 DIAGNOSIS — Z79899 Other long term (current) drug therapy: Secondary | ICD-10-CM | POA: Diagnosis not present

## 2016-07-14 DIAGNOSIS — I639 Cerebral infarction, unspecified: Secondary | ICD-10-CM

## 2016-07-14 DIAGNOSIS — E669 Obesity, unspecified: Secondary | ICD-10-CM | POA: Diagnosis present

## 2016-07-14 DIAGNOSIS — M549 Dorsalgia, unspecified: Secondary | ICD-10-CM | POA: Diagnosis present

## 2016-07-14 DIAGNOSIS — I10 Essential (primary) hypertension: Secondary | ICD-10-CM | POA: Diagnosis present

## 2016-07-14 DIAGNOSIS — Z7984 Long term (current) use of oral hypoglycemic drugs: Secondary | ICD-10-CM | POA: Diagnosis not present

## 2016-07-14 DIAGNOSIS — E119 Type 2 diabetes mellitus without complications: Secondary | ICD-10-CM

## 2016-07-14 DIAGNOSIS — W010XXA Fall on same level from slipping, tripping and stumbling without subsequent striking against object, initial encounter: Secondary | ICD-10-CM | POA: Diagnosis present

## 2016-07-14 DIAGNOSIS — E1140 Type 2 diabetes mellitus with diabetic neuropathy, unspecified: Secondary | ICD-10-CM | POA: Diagnosis present

## 2016-07-14 DIAGNOSIS — Z6833 Body mass index (BMI) 33.0-33.9, adult: Secondary | ICD-10-CM | POA: Diagnosis not present

## 2016-07-14 DIAGNOSIS — I25810 Atherosclerosis of coronary artery bypass graft(s) without angina pectoris: Secondary | ICD-10-CM

## 2016-07-14 DIAGNOSIS — E039 Hypothyroidism, unspecified: Secondary | ICD-10-CM

## 2016-07-14 DIAGNOSIS — F329 Major depressive disorder, single episode, unspecified: Secondary | ICD-10-CM | POA: Diagnosis present

## 2016-07-14 DIAGNOSIS — H02401 Unspecified ptosis of right eyelid: Secondary | ICD-10-CM | POA: Diagnosis present

## 2016-07-14 DIAGNOSIS — Z8249 Family history of ischemic heart disease and other diseases of the circulatory system: Secondary | ICD-10-CM | POA: Diagnosis not present

## 2016-07-14 DIAGNOSIS — Z888 Allergy status to other drugs, medicaments and biological substances status: Secondary | ICD-10-CM | POA: Diagnosis not present

## 2016-07-14 DIAGNOSIS — E876 Hypokalemia: Secondary | ICD-10-CM | POA: Diagnosis present

## 2016-07-14 DIAGNOSIS — G2581 Restless legs syndrome: Secondary | ICD-10-CM | POA: Diagnosis present

## 2016-07-14 DIAGNOSIS — M545 Low back pain: Secondary | ICD-10-CM | POA: Diagnosis not present

## 2016-07-14 DIAGNOSIS — H9190 Unspecified hearing loss, unspecified ear: Secondary | ICD-10-CM | POA: Diagnosis present

## 2016-07-14 DIAGNOSIS — E785 Hyperlipidemia, unspecified: Secondary | ICD-10-CM | POA: Diagnosis present

## 2016-07-14 LAB — ECHOCARDIOGRAM COMPLETE
FS: 37 % (ref 28–44)
Height: 65 in
IVS/LV PW RATIO, ED: 0.98
LA ID, A-P, ES: 45 mm
LA diam end sys: 45 mm
LA diam index: 2.27 cm/m2
LA vol A4C: 48.3 ml
LA vol index: 29.7 mL/m2
LA vol: 58.9 mL
LV PW d: 9.81 mm — AB (ref 0.6–1.1)
LVOT area: 3.14 cm2
LVOT diameter: 20 mm
Lateral S' vel: 8.5 cm/s
Weight: 3200 oz

## 2016-07-14 LAB — GLUCOSE, CAPILLARY
Glucose-Capillary: 122 mg/dL — ABNORMAL HIGH (ref 65–99)
Glucose-Capillary: 148 mg/dL — ABNORMAL HIGH (ref 65–99)
Glucose-Capillary: 133 mg/dL — ABNORMAL HIGH (ref 65–99)
Glucose-Capillary: 158 mg/dL — ABNORMAL HIGH (ref 65–99)

## 2016-07-14 LAB — URINE MICROSCOPIC-ADD ON

## 2016-07-14 LAB — URINALYSIS, ROUTINE W REFLEX MICROSCOPIC
Bilirubin Urine: NEGATIVE
Glucose, UA: NEGATIVE mg/dL
Hgb urine dipstick: NEGATIVE
Ketones, ur: NEGATIVE mg/dL
Nitrite: NEGATIVE
Protein, ur: NEGATIVE mg/dL
Specific Gravity, Urine: 1.008 (ref 1.005–1.030)
pH: 7 (ref 5.0–8.0)

## 2016-07-14 LAB — RAPID URINE DRUG SCREEN, HOSP PERFORMED
Amphetamines: NOT DETECTED
Barbiturates: NOT DETECTED
Benzodiazepines: POSITIVE — AB
Cocaine: NOT DETECTED
Opiates: NOT DETECTED
Tetrahydrocannabinol: NOT DETECTED

## 2016-07-14 LAB — CBC
HCT: 42.6 % (ref 36.0–46.0)
Hemoglobin: 13.8 g/dL (ref 12.0–15.0)
MCH: 30.5 pg (ref 26.0–34.0)
MCHC: 32.4 g/dL (ref 30.0–36.0)
MCV: 94 fL (ref 78.0–100.0)
Platelets: 191 10*3/uL (ref 150–400)
RBC: 4.53 MIL/uL (ref 3.87–5.11)
RDW: 12.8 % (ref 11.5–15.5)
WBC: 8 10*3/uL (ref 4.0–10.5)

## 2016-07-14 LAB — BASIC METABOLIC PANEL
Anion gap: 10 (ref 5–15)
BUN: 15 mg/dL (ref 6–20)
CO2: 25 mmol/L (ref 22–32)
Calcium: 9.2 mg/dL (ref 8.9–10.3)
Chloride: 104 mmol/L (ref 101–111)
Creatinine, Ser: 0.85 mg/dL (ref 0.44–1.00)
GFR calc Af Amer: >60 mL/min (ref >60)
GFR calc non Af Amer: >60 mL/min (ref >60)
Glucose, Bld: 157 mg/dL — ABNORMAL HIGH (ref 65–99)
Potassium: 3.6 mmol/L (ref 3.5–5.1)
Sodium: 139 mmol/L (ref 135–145)

## 2016-07-14 LAB — MAGNESIUM: MAGNESIUM: 1.8 mg/dL (ref 1.7–2.4)

## 2016-07-14 MED ORDER — MAGNESIUM SULFATE 2 GM/50ML IV SOLN
2.0000 g | Freq: Once | INTRAVENOUS | Status: AC
  Administered 2016-07-14: 2 g via INTRAVENOUS
  Filled 2016-07-14: qty 50

## 2016-07-14 MED ORDER — IBUPROFEN 200 MG PO TABS
400.0000 mg | ORAL_TABLET | Freq: Four times a day (QID) | ORAL | Status: DC | PRN
  Administered 2016-07-14 – 2016-07-15 (×2): 400 mg via ORAL
  Filled 2016-07-14 (×2): qty 2

## 2016-07-14 MED ORDER — STROKE: EARLY STAGES OF RECOVERY BOOK
Freq: Once | Status: AC
Start: 2016-07-14 — End: 2016-07-14
  Administered 2016-07-14: 12:00:00
  Filled 2016-07-14: qty 1

## 2016-07-14 MED ORDER — POTASSIUM CHLORIDE CRYS ER 20 MEQ PO TBCR
40.0000 meq | EXTENDED_RELEASE_TABLET | Freq: Once | ORAL | Status: AC
  Administered 2016-07-14: 40 meq via ORAL
  Filled 2016-07-14: qty 2

## 2016-07-14 MED ORDER — SENNOSIDES-DOCUSATE SODIUM 8.6-50 MG PO TABS: 1.0000 | ORAL_TABLET | Freq: Every evening | ORAL | Status: DC | PRN

## 2016-07-14 MED ORDER — SODIUM CHLORIDE 0.9 % IV SOLN
INTRAVENOUS | Status: AC
  Administered 2016-07-14: 19:00:00 via INTRAVENOUS

## 2016-07-14 MED ORDER — METOPROLOL TARTRATE 12.5 MG HALF TABLET
12.5000 mg | ORAL_TABLET | Freq: Two times a day (BID) | ORAL | Status: DC
  Administered 2016-07-14 – 2016-07-15 (×2): 12.5 mg via ORAL
  Filled 2016-07-14 (×2): qty 1

## 2016-07-14 MED ORDER — CLOPIDOGREL BISULFATE 75 MG PO TABS
75.0000 mg | ORAL_TABLET | Freq: Every day | ORAL | Status: DC
  Administered 2016-07-15: 75 mg via ORAL
  Filled 2016-07-14: qty 1

## 2016-07-14 MED ORDER — METHOCARBAMOL 500 MG PO TABS: 500.0000 mg | ORAL_TABLET | Freq: Three times a day (TID) | ORAL | Status: DC | PRN

## 2016-07-14 MED ORDER — ACETAMINOPHEN 325 MG PO TABS: 650.0000 mg | ORAL_TABLET | Freq: Four times a day (QID) | ORAL | Status: DC | PRN

## 2016-07-14 NOTE — Progress Notes (Signed)
PROGRESS NOTE    Sierra Wright  ZOX:[id number] DOB: 02/26/1934 DOA: 07/13/2016 PCP: Emeterio ReeveWOLTERS,SHARON A, MD   Brief Narrative:  82 year old female with history of weakness, restless leg syndrome, , CAD, HTN and DM. Patient presents with dizziness that started earlier today with reported double vision. On further questioning, patient described the dizziness as weakness. Patient has been for a while. The double vision has resolved according to the patient. Patient was seen by Dr. Hilda BladesArmstrong, the Neurologist (Kindly see the documentation). MRI of throax and lumbar spine has been advised by the Neurologist. Patient is back to her baseline. No headache, no neck pain, no chest pain, no GI symptoms and no urinary symptoms.   Assessment & Plan:   Principal Problem:   CVA (cerebral infarction): Early subacute Active Problems:   DM type 2 goal A1C below 7.5   Binocular vision disorder with diplopia   Weakness   Back pain  #1 9 mm early subacute ischemic nonhemorrhagic infarct involving right thalamus Per MRI. Patient had presented with dizziness and double vision leading to a fall. Patient with clinical improvement states dizziness and double vision has resolved. Patient states back pain has improved. 2-D echo is been done with a EF of 60-65% with no wall motion abnormalities. No source of emboli. Carotid Dopplers with no significant ICA stenosis. Fasting lipid panel pending. Hemoglobin A1c pending. PT/OT/ST. Respect to modification. Aspirin has been changed to Plavix per neurology recommendations. Continue Plavix for secondary stroke prevention. Neurology has been consulted and following.  #2 back pain Secondary to fall. MRI of the T and L-spine pending. Pain management.  #3 diabetes mellitus type 2 Hemoglobin A1c pending. Continue sliding scale insulin.  #4 second degree AV block Noted on telemetry. Patient denies any chest pain. Patient did present with some dizziness however dizziness likely  attribute it to problem #1. Patient on a beta blocker. May need to decrease beta blocker dose. Will monitor for now. Will have cardiology assess.  #5 hypothyroidism Continue home dose Synthroid.  #6 hypokalemia Replete. Keep magnesium greater than 2.  #7 hypertension Continue current regimen of Norvasc, benazepril, hydralazine, Lopressor.  #8 coronary artery disease status post CABG Stable. Patient denies any chest pain. 2-D echo with EF of 60-65% with no wall motion abnormalities. Continue current regimen of benazepril, hydralazine, Lopressor.    DVT prophylaxis: Lovenox Code Status: Full Family Communication: Updated patient, daughter, niece at bedside. Disposition Plan: Home when medically stable hopefully tomorrow.   Consultants:   Neurology: Dr. Hilda BladesArmstrong 07/13/2016  Procedures:   2-D echo 07/14/2016  Chest x-ray 07/14/2016  CT C-spine 07/13/2016  MRI brain 07/13/2016  Carotid Dopplers 07/14/2016  Antimicrobials:   None   Subjective: Patient denies any further diplopia. Patient states back pain improved. Patient eating lunch. Patient denies any chest. Patient denies any shortness of breath. Patient asking whether she can go home today.  Objective: Vitals:   07/13/16 2133 07/13/16 2324 07/14/16 0145 07/14/16 0441  BP: (!) 151/86 (!) 152/66 (!) 149/51 (!) 151/58  Pulse: 83  71 85  Resp: 16 16 16 16   Temp: 98.4 F (36.9 C) 98.4 F (36.9 C) 98.1 F (36.7 C) 98.1 F (36.7 C)  TempSrc: Oral Oral Oral Oral  SpO2: 99% 95% 99% 95%  Weight:      Height:        Intake/Output Summary (Last 24 hours) at 07/14/16 1228 Last data filed at 07/13/16 2100  Gross per 24 hour  Intake  355 ml  Output                0 ml  Net              355 ml   Filed Weights   07/13/16 1610  Weight: 90.7 kg (200 lb)    Examination:  General exam: Appears calm and comfortable  Respiratory system: Clear to auscultation. Respiratory effort  normal. Cardiovascular system: S1 & S2 heard, RRR. No JVD, murmurs, rubs, gallops or clicks. No pedal edema. Gastrointestinal system: Abdomen is nondistended, soft and nontender. No organomegaly or masses felt. Normal bowel sounds heard. Central nervous system: Alert and oriented. No focal neurological deficits. Extremities: Symmetric 5 x 5 power. Skin: No rashes, lesions or ulcers Psychiatry: Judgement and insight appear normal. Mood & affect appropriate.     Data Reviewed: I have personally reviewed following labs and imaging studies  CBC:  Recent Labs Lab 07/13/16 0714 07/13/16 0724 07/13/16 1322 07/14/16 0706  WBC 9.5  --  9.2 8.0  NEUTROABS 6.4  --   --   --   HGB 13.0 13.6 13.0 13.8  HCT 39.1 40.0 39.7 42.6  MCV 93.3  --  92.5 94.0  PLT 163  --  166 191   Basic Metabolic Panel:  Recent Labs Lab 07/13/16 0714 07/13/16 0724 07/13/16 1322 07/14/16 0706 07/14/16 0920  NA 135 136  --  139  --   K 3.9 3.9  --  3.6  --   CL 101 100*  --  104  --   CO2 25  --   --  25  --   GLUCOSE 151* 145*  --  157*  --   BUN 21* 24*  --  15  --   CREATININE 0.91 0.90 1.00 0.85  --   CALCIUM 9.4  --   --  9.2  --   MG  --   --  1.6*  --  1.8  PHOS  --   --  2.9  --   --    GFR: Estimated Creatinine Clearance: 58.8 mL/min (by C-G formula based on SCr of 0.85 mg/dL). Liver Function Tests:  Recent Labs Lab 07/13/16 0714  AST 18  ALT 19  ALKPHOS 52  BILITOT 0.9  PROT 5.6*  ALBUMIN 3.5   No results for input(s): LIPASE, AMYLASE in the last 168 hours. No results for input(s): AMMONIA in the last 168 hours. Coagulation Profile:  Recent Labs Lab 07/13/16 0714  INR 1.00   Cardiac Enzymes: No results for input(s): CKTOTAL, CKMB, CKMBINDEX, TROPONINI in the last 168 hours. BNP (last 3 results) No results for input(s): PROBNP in the last 8760 hours. HbA1C: No results for input(s): HGBA1C in the last 72 hours. CBG:  Recent Labs Lab 07/13/16 1146 07/13/16 1638  07/13/16 2246 07/14/16 0602 07/14/16 1201  GLUCAP 141* 213* 151* 122* 148*   Lipid Profile: No results for input(s): CHOL, HDL, LDLCALC, TRIG, CHOLHDL, LDLDIRECT in the last 72 hours. Thyroid Function Tests:  Recent Labs  07/13/16 1322  TSH 1.037   Anemia Panel: No results for input(s): VITAMINB12, FOLATE, FERRITIN, TIBC, IRON, RETICCTPCT in the last 72 hours. Sepsis Labs: No results for input(s): PROCALCITON, LATICACIDVEN in the last 168 hours.  No results found for this or any previous visit (from the past 240 hour(s)).       Radiology Studies: Dg Chest 2 View  Result Date: 07/14/2016 CLINICAL DATA:  Patient with worsening shortness of breath for  multiple months. EXAM: CHEST  2 VIEW COMPARISON:  Chest radiograph 07/13/2016 FINDINGS: Monitoring leads overlie the patient. Stable cardiac and mediastinal contours status post median sternotomy. Tortuosity and calcification of thoracic aorta. No consolidative pulmonary opacities. No pleural effusion or pneumothorax. Right shoulder joint degenerative changes. Thoracic spine degenerative changes. IMPRESSION: No acute cardiopulmonary process. Electronically Signed   By: Annia Belt M.D.   On: 07/14/2016 09:29   Dg Ribs Unilateral W/chest Right  Result Date: 07/13/2016 CLINICAL DATA:  82 year old female with right-sided rib pain status post fall EXAM: RIGHT RIBS AND CHEST - 3+ VIEW COMPARISON:  Prior chest x-ray 04/24/2007 FINDINGS: No evidence of displaced rib fracture. Advanced degenerative osteoarthritis at the right glenohumeral joint. Patient is status post median sternotomy with evidence of prior multivessel CABG. Surgical clips in the right upper quadrant suggest prior cholecystectomy. Stable mild chronic bronchitic change. No suspicious mass or nodule. IMPRESSION: 1. No evidence of acute rib fracture. 2. Advanced right glenohumeral joint osteoarthritis. Electronically Signed   By: Malachy Moan M.D.   On: 07/13/2016 08:04   Dg  Forearm Left  Result Date: 07/13/2016 CLINICAL DATA:  Larey Seat this morning.  Left forearm pain. EXAM: LEFT FOREARM - 2 VIEW COMPARISON:  None. FINDINGS: Degenerative changes are noted at the wrist with settling of the scapholunate into the distal radius. There is also widening of the scapholunate joint space suggesting ligamentous insufficiency. The elbow joint is maintained. Small olecranon spur. No acute forearm fracture. IMPRESSION: No acute forearm fracture. Electronically Signed   By: Rudie Meyer M.D.   On: 07/13/2016 08:03   Ct Cervical Spine Wo Contrast  Result Date: 07/13/2016 CLINICAL DATA:  New onset dizziness. Patient fell upon waking at 5 a.m. today. EXAM: CT HEAD WITHOUT CONTRAST CT CERVICAL SPINE WITHOUT CONTRAST TECHNIQUE: Multidetector CT imaging of the head and cervical spine was performed following the standard protocol without intravenous contrast. Multiplanar CT image reconstructions of the cervical spine were also generated. COMPARISON:  CT head without contrast 05/27/2014. FINDINGS: CT HEAD FINDINGS Moderate subcortical white matter hypoattenuation is again seen bilaterally without significant interval change. No acute infarct, hemorrhage, or mass lesion is present. The insular ribbon is within normal limits bilaterally. The basal ganglia are intact. No focal cortical infarct is evident. Mild generalized atrophy is present. The ventricles are of normal size. No significant extra-axial fluid collection is present. No significant extracranial soft tissue injury is present. The calvarium is intact. The paranasal sinuses and mastoid air cells are clear. Atherosclerotic calcifications are again noted at the cavernous internal carotid arteries at the dural margin of both vertebral arteries. Bilateral lens replacements are present. The globes and orbits are otherwise intact. CT CERVICAL SPINE FINDINGS The cervical spine is imaged and skull base through T1-2. Vertebral body heights and alignment  are normal. No acute fracture traumatic subluxation is present. Multilevel facet degenerative change is present. This is most significant on the right at C3-4 and on the left at C4-5. Osseous foraminal narrowing is worse on the left at C4-5 and on the right at C3-4. IMPRESSION: 1. No acute intracranial abnormality or significant interval change. 2. Stable atrophy and white matter disease. This likely reflects the sequela of chronic microvascular ischemia. 3. Moderate spondylosis in the cervical spine as described. 4. No acute fracture or traumatic subluxation in the cervical spine. Electronically Signed   By: Marin Roberts M.D.   On: 07/13/2016 08:29   Mr Brain W Or Wo Contrast  Result Date: 07/13/2016 CLINICAL DATA:  Initial  evaluation for fine ocular diplopia and ataxia. EXAM: MRI HEAD WITHOUT AND WITH CONTRAST TECHNIQUE: Multiplanar, multiecho pulse sequences of the brain and surrounding structures were obtained without and with intravenous contrast. CONTRAST:  20mL MULTIHANCE GADOBENATE DIMEGLUMINE 529 MG/ML IV SOLN COMPARISON:  Prior CT from earlier the same day. FINDINGS: Diffuse prominence of the CSF containing spaces compatible with generalized age-related cerebral atrophy. Patchy T2/FLAIR hyperintensity within the periventricular and deep white matter both cerebral hemispheres present, most like related to chronic small vessel ischemic disease, mild for age. There is any 9 mm focus of abnormal restricted diffusion involving the medial right thalamus, extending towards the right cerebral peduncle, compatible with a small early subacute ischemic infarct (series 3, image 27). No associated hemorrhage or mass effect. No other acute infarct. Gray-white matter differentiation otherwise maintained. Major intracranial vascular flow voids are preserved. No areas of chronic infarction. No mass lesion, midline shift, or mass effect. No hydrocephalus. No abnormal enhancement. Major dural sinuses are patent.  No extra-axial fluid collection. Craniocervical junction normal. Visualized upper cervical spine unremarkable without significant degenerative changes are stenosis. Pituitary gland within normal limits. No acute abnormality about the globes and orbits. Patient is status post lens extraction bilaterally. Mild mucosal thickening within the ethmoidal air cells. Paranasal sinuses are otherwise clear. No mastoid effusion. Inner ear structures within normal limits. Bone marrow signal intensity normal. Mild hyperostosis frontalis interna noted. No scalp soft tissue abnormality. IMPRESSION: 1. 9 mm early subacute ischemic nonhemorrhagic infarct involving the medial right thalamus. No associated mass effect. 2. No other acute intracranial process identified. 3. Mild age-related cerebral atrophy with chronic small vessel ischemic disease. Electronically Signed   By: Rise Mu M.D.   On: 07/13/2016 23:23   Dg Hand Complete Left  Result Date: 07/13/2016 CLINICAL DATA:  82 year old female with left thumb pain, bruising and swelling after falling earlier this morning EXAM: LEFT HAND - COMPLETE 3+ VIEW COMPARISON:  Concurrently obtained radiographs of the left forearm FINDINGS: No evidence of acute fracture or malalignment. Degenerative osteoarthritis is present at the first carpometacarpal joint and throughout the distal interphalangeal joints and the third fourth and fifth proximal interphalangeal joint. Degenerative changes are quite pronounced at the proximal interphalangeal joints of the digits 4 and 5 with at least partial bony ankylosis. The bones appear diffusely demineralized. Mild degenerative change at the STT joint. IMPRESSION: No convincing evidence of acute fracture. Advanced multifocal degenerative osteoarthritis including at the STT and first [facility] joint in the region of the patient's clinical symptoms. Electronically Signed   By: Malachy Moan M.D.   On: 07/13/2016 08:02   Ct Head Code Stroke  W/o Cm  Result Date: 07/13/2016 CLINICAL DATA:  New onset dizziness. Patient fell upon waking at 5 a.m. today. EXAM: CT HEAD WITHOUT CONTRAST CT CERVICAL SPINE WITHOUT CONTRAST TECHNIQUE: Multidetector CT imaging of the head and cervical spine was performed following the standard protocol without intravenous contrast. Multiplanar CT image reconstructions of the cervical spine were also generated. COMPARISON:  CT head without contrast 05/27/2014. FINDINGS: CT HEAD FINDINGS Moderate subcortical white matter hypoattenuation is again seen bilaterally without significant interval change. No acute infarct, hemorrhage, or mass lesion is present. The insular ribbon is within normal limits bilaterally. The basal ganglia are intact. No focal cortical infarct is evident. Mild generalized atrophy is present. The ventricles are of normal size. No significant extra-axial fluid collection is present. No significant extracranial soft tissue injury is present. The calvarium is intact. The paranasal sinuses and mastoid air  cells are clear. Atherosclerotic calcifications are again noted at the cavernous internal carotid arteries at the dural margin of both vertebral arteries. Bilateral lens replacements are present. The globes and orbits are otherwise intact. CT CERVICAL SPINE FINDINGS The cervical spine is imaged and skull base through T1-2. Vertebral body heights and alignment are normal. No acute fracture traumatic subluxation is present. Multilevel facet degenerative change is present. This is most significant on the right at C3-4 and on the left at C4-5. Osseous foraminal narrowing is worse on the left at C4-5 and on the right at C3-4. IMPRESSION: 1. No acute intracranial abnormality or significant interval change. 2. Stable atrophy and white matter disease. This likely reflects the sequela of chronic microvascular ischemia. 3. Moderate spondylosis in the cervical spine as described. 4. No acute fracture or traumatic  subluxation in the cervical spine. Electronically Signed   By: Marin Roberts M.D.   On: 07/13/2016 08:29        Scheduled Meds: . amLODipine  5 mg Oral Daily  . aspirin EC  325 mg Oral Daily  . benazepril  40 mg Oral Daily  . enoxaparin (LOVENOX) injection  40 mg Subcutaneous Q24H  . hydrALAZINE  25 mg Oral TID  . insulin aspart  0-5 Units Subcutaneous QHS  . insulin aspart  0-9 Units Subcutaneous TID WC  . levothyroxine  112 mcg Oral QAC breakfast  . metoprolol tartrate  25 mg Oral BID  . omega-3 acid ethyl esters  1 g Oral BID  . omega-3 acid ethyl esters  2 g Oral Q1200  . vitamin B-12  1,000 mcg Oral Daily   Continuous Infusions: . sodium chloride 75 mL/hr at 07/13/16 1152  . sodium chloride       LOS: 0 days    Time spent: 40 MINS    Larin Depaoli, MD [facility] Pager [phone]  If 7PM-7AM, please contact night-coverage [email or web address] Password [facility] 07/14/2016, 12:28 PM

## 2016-07-14 NOTE — Progress Notes (Signed)
STROKE TEAM PROGRESS NOTE   HISTORY OF PRESENT ILLNESS (per record) Sierra Wright is an 82 y.o. female who reports that she got out of bed around 3 AM to go to the bathroom. When she did she appears to have slipped and fallen. She now presents with a complaint of some back pain but no focal neurological deficits. The family reports that she walks with a walker. They note she has a baseline unsteady gait.   SUBJECTIVE (INTERVAL HISTORY) She denies any numbness.  In the past few months, she has had numbness,tingling, and tightness in the feet and legs at night with an urge to move them interfering with her sleep.  MRI Brain showed right thalamic ischemic infarct.  TTE was normal.  CDUS is pending.     OBJECTIVE Temp:  [98.1 F (36.7 C)-98.9 F (37.2 C)] 98.1 F (36.7 C) (08/12 0441) Pulse Rate:  [66-88] 85 (08/12 0441) Cardiac Rhythm: Heart block;Bundle branch block (08/11 1900) Resp:  [16-18] 16 (08/12 0441) BP: (130-193)/(51-86) 151/58 (08/12 0441) SpO2:  [94 %-100 %] 95 % (08/12 0441)  CBC:  Recent Labs Lab 07/13/16 0714  07/13/16 1322 07/14/16 0706  WBC 9.5  --  9.2 8.0  NEUTROABS 6.4  --   --   --   HGB 13.0  < > 13.0 13.8  HCT 39.1  < > 39.7 42.6  MCV 93.3  --  92.5 94.0  PLT 163  --  166 191  < > = values in this interval not displayed.  Basic Metabolic Panel:  Recent Labs Lab 07/13/16 0714 07/13/16 0724 07/13/16 1322 07/14/16 0706  NA 135 136  --  139  K 3.9 3.9  --  3.6  CL 101 100*  --  104  CO2 25  --   --  25  GLUCOSE 151* 145*  --  157*  BUN 21* 24*  --  15  CREATININE 0.91 0.90 1.00 0.85  CALCIUM 9.4  --   --  9.2  MG  --   --  1.6*  --   PHOS  --   --  2.9  --     Lipid Panel: No results found for: CHOL, TRIG, HDL, CHOLHDL, VLDL, LDLCALC HgbA1c:  Lab Results  Component Value Date   HGBA1C 6.9 (H) 07/26/2015   Urine Drug Screen:    Component Value Date/Time   LABOPIA NONE DETECTED 07/14/2016 0008   COCAINSCRNUR NONE DETECTED 07/14/2016  0008   LABBENZ POSITIVE (A) 07/14/2016 0008   AMPHETMU NONE DETECTED 07/14/2016 0008   THCU NONE DETECTED 07/14/2016 0008   LABBARB NONE DETECTED 07/14/2016 0008      IMAGING  Dg Chest 2 View 07/14/2016 No acute cardiopulmonary process.    Dg Ribs Unilateral W/chest Right 07/13/2016 1. No evidence of acute rib fracture.  2. Advanced right glenohumeral joint osteoarthritis.    Dg Forearm Left 07/13/2016 No acute forearm fracture.    Ct Cervical Spine Wo Contrast 07/13/2016 1. No acute intracranial abnormality Wright significant interval change.  2. Stable atrophy and white matter disease. This likely reflects the sequela of chronic microvascular ischemia.  3. Moderate spondylosis in the cervical spine as described.  4. No acute fracture Wright traumatic subluxation in the cervical spine.    Mr Sierra Wright Wo Contrast 07/13/2016 1. 9 mm early subacute ischemic nonhemorrhagic infarct involving the medial right thalamus.   No associated mass effect.  2. No other acute intracranial process identified.  3. Mild age-related cerebral  atrophy with chronic small vessel ischemic disease.     Dg Hand Complete Left 07/13/2016 No convincing evidence of acute fracture. Advanced multifocal degenerative osteoarthritis including at the STT and [facility] in the region of the patient's clinical symptoms.    Ct Head Code Stroke W/o Cm 07/13/2016 1. No acute intracranial abnormality Wright significant interval change.  2. Stable atrophy and white matter disease. This likely reflects the sequela of chronic microvascular ischemia.  3. Moderate spondylosis in the cervical spine as described.  4. No acute fracture Wright traumatic subluxation in the cervical spine.     PHYSICAL EXAM  Awake, alert, fully oriented.  Language-fluent, Comprehension, naming, repetition- intact. Face symmetric. Tongue midline. EOMI. Strength 5/5 bilaterally. Pinprick sensation is intact and symmetrical in the face,  arms, and legs. Coord- FTN is intact.     ASSESSMENT/PLAN Ms. Dudley MajorSarah L Wright is a 82 y.o. female with history of hypertension, hard of hearing, diabetic neuropathy, diabetes mellitus, coronary artery disease, and status post coronary artery bypass graft surgery presenting with a recent fall with unsteady gait. She did not receive IV t-PA due to minimal deficits.  Stroke:  Non-dominant infarct secondary to small vessel disease.   MRI - 9 mm early subacute ischemic nonhemorrhagic infarct involving the medial right thalamus.   MRA  - not performed  Carotid Doppler - 1-39% ICA plaquing.  Vertebral artery flow is antegrade.   2D Echo - pending  LDL - pending  HgbA1c pending  VTE prophylaxis - Lovenox  Diet heart healthy/carb modified Room service appropriate? Yes; Fluid consistency: Thin  aspirin 81 mg daily prior to admission, now on aspirin 325 mg daily  Patient counseled to be compliant with her antithrombotic medications  Ongoing aggressive stroke risk factor management  Therapy recommendations:  Outpatient physical therapy recommended with 24-hour per day supervision.  Disposition:  Pending  Hypertension  Stable  Permissive hypertension (OK if < 220/120) but gradually normalize in 5-7 days  Long-term BP goal normotensive  Hyperlipidemia  Home meds:  Statin allergy  LDL pending, goal < 70    Diabetes  HgbA1c pending, goal < 7.0  Unc / Controlled  Other Stroke Risk Factors  Advanced age  Obesity, Body mass index is 33.28 kg/m., recommend weight loss, diet and exercise as appropriate   Coronary artery disease   Other Active Problems  Second degree AV block on telemetry / bradycardia - consider cardiology consult - consider holding beta blocker  Hospital day # 0  1. Acute right thalamic ischemic infarct without any clear residual.  BP needs to be controlled better, so recommend adjusting antihypertensives to that effect after 24 hours.  She was  already on ASA 81 mg qd, and studies have shown 325 mg qd is no better for secondary stroke prevention, so will change to Plavix 75 mg qd.    2. Probable diabetic polyneuropathy with secondary restless legs syndrome.  This can be addressed as outpatient by a neurologist.    Weston SettleShervin Alesha Jaffee, MD    To contact Stroke Continuity provider, please refer to [email or web address]. After hours, contact General Neurology

## 2016-07-14 NOTE — Progress Notes (Signed)
VASCULAR LAB PRELIMINARY  PRELIMINARY  PRELIMINARY  PRELIMINARY  Carotid duplex completed.    Preliminary report:  1-39% ICA plaquing.  Vertebral artery flow is antegrade.   Jadavion Spoelstra, RVT 07/14/2016, 10:09 AM

## 2016-07-14 NOTE — Evaluation (Signed)
Occupational Therapy Evaluation and Discharge Patient Details Name: Sierra MajorSarah L Wright MRN: [id number] DOB: 09/28/1934 Today's Date: 07/14/2016    History of Present Illness 82 year old female with history of weakness, restless leg syndrome, CAD, HTN and DM. Patient presents with dizziness that started earlier today with reported double vision.   Clinical Impression   Pt reports she was independent with ADL PTA. Currently pt overall supervision for safety with ADL and functional mobility. Pt denies dizziness with positional changes and functional mobility. Pt reports visual changes (diplopia) have resolved and she feels she has returned to baseline. Pt planning to d/c home with 24/7 supervision from family. No further acute OT needs identified; signing off at this time. Please re-consult if needs change. Thank you for this referral.    Follow Up Recommendations  No OT follow up;Supervision/Assistance - 24 hour    Equipment Recommendations  None recommended by OT    Recommendations for Other Services       Precautions / Restrictions Precautions Precautions: Fall Restrictions Weight Bearing Restrictions: No      Mobility Bed Mobility Overal bed mobility: Needs Assistance Bed Mobility: Supine to Sit     Supine to sit: Supervision     General bed mobility comments: Supervision for safety. HOB elevated with use of bed rails.  Transfers Overall transfer level: Needs assistance Equipment used: Rolling walker (2 wheeled) Transfers: Sit to/from Stand Sit to Stand: Supervision         General transfer comment: Supervision for safety; no physical assist required. Increased time needed.    Balance Overall balance assessment: Needs assistance Sitting-balance support: No upper extremity supported;Feet supported Sitting balance-Leahy Scale: Fair     Standing balance support: No upper extremity supported;During functional activity Standing balance-Leahy Scale: Fair Standing  balance comment: Able to stand at sink and wash hands without UE support                            ADL Overall ADL's : Needs assistance/impaired Eating/Feeding: Set up;Sitting   Grooming: Supervision/safety;Standing;Wash/dry hands   Upper Body Bathing: Set up;Supervision/ safety;Sitting   Lower Body Bathing: Supervison/ safety;Sit to/from stand   Upper Body Dressing : Set up;Sitting   Lower Body Dressing: Supervision/safety;Sit to/from stand   Toilet Transfer: Supervision/safety;Ambulation;BSC;RW Toilet Transfer Details (indicate cue type and reason): Simulated by sit to stand from EOB with functional mobility in room. Toileting- Clothing Manipulation and Hygiene: Supervision/safety;Sit to/from stand     Tub/Shower Transfer Details (indicate cue type and reason): Pt reports she only takes sponge baths. Functional mobility during ADLs: Supervision/safety;Rolling walker General ADL Comments: Pt initially declining to work with OT; with max verbal encouragement pt agreeable to participate. Pt reports no dizziness or lightheadedness during functional activities. Reports visual deficits (double vision) have resolved.     Vision Additional Comments: Appears WFL. Pt denied diplopia and completing functional actvities/reading without difficulty.   Perception     Praxis      Pertinent Vitals/Pain Pain Assessment: No/denies pain     Hand Dominance Right   Extremity/Trunk Assessment Upper Extremity Assessment Upper Extremity Assessment: Overall WFL for tasks assessed   Lower Extremity Assessment Lower Extremity Assessment: Defer to PT evaluation   Cervical / Trunk Assessment Cervical / Trunk Assessment: Kyphotic   Communication Communication Communication: No difficulties   Cognition Arousal/Alertness: Awake/alert Behavior During Therapy: Flat affect Overall Cognitive Status: History of cognitive impairments - at baseline  General  Comments       Exercises       Shoulder Instructions      Home Living Family/patient expects to be discharged to:: Private residence Living Arrangements: Spouse/significant other Available Help at Discharge: Family;Available 24 hours/day Type of Home: House Home Access: Ramped entrance     Home Layout: One level     Bathroom Shower/Tub: Tub/shower unit (only takes sponge baths right now)   Bathroom Toilet: Standard     Home Equipment: Environmental consultant - 2 wheels;Wheelchair - manual;Toilet riser          Prior Functioning/Environment Level of Independence: Independent with assistive device(s)        Comments: sponge bathes, walks short distance in house with RW    OT Diagnosis: Generalized weakness;Cognitive deficits   OT Problem List:     OT Treatment/Interventions:      OT Goals(Current goals can be found in the care plan section) Acute Rehab OT Goals Patient Stated Goal: go home OT Goal Formulation: All assessment and education complete, DC therapy  OT Frequency:     Barriers to D/C:            Co-evaluation              End of Session Equipment Utilized During Treatment: Rolling walker Nurse Communication: Mobility status;Other (comment) (pt sitting EOB eating lunch)  Activity Tolerance: Patient tolerated treatment well Patient left: with call bell/phone within reach;with family/visitor present (sitting EOB)   Time: 1610-9604 OT Time Calculation (min): 11 min Charges:  OT General Charges $OT Visit: 1 Procedure OT Evaluation $OT Eval Moderate Complexity: 1 Procedure G-Codes: OT G-codes **NOT FOR INPATIENT CLASS** Functional Assessment Tool Used: Clinical judgement Functional Limitation: Self care Self Care Current Status (V4098): At least 1 percent but less than 20 percent impaired, limited or restricted Self Care Goal Status (J1914): At least 1 percent but less than 20 percent impaired, limited or restricted Self Care Discharge Status [phone]): At  least 1 percent but less than 20 percent impaired, limited or restricted   Gaye Alken M.S., OTR/L Pager: [phone]  07/14/2016, 12:08 PM

## 2016-07-14 NOTE — Progress Notes (Signed)
  Echocardiogram 2D Echocardiogram has been performed.  Sierra Wright, Naydene Kamrowski 07/14/2016, 10:06 AM

## 2016-07-14 NOTE — Progress Notes (Signed)
Telemetry reviewed, occasional second degree AV block type I Sierra Wright([place]). No evidence of high grade block, overall fairly benign finding. Potentially mediaction related, she is on lopressor. Also post CVA can have increased parasympathetic tone. Would decrease lopressor to 12.5mg  bid, follow on telemetry. If contniues can d/c lopressor If more significant block call us back.    Dominga FerryJ Mishal Probert MD

## 2016-07-14 NOTE — Progress Notes (Signed)
OT Cancellation Note  Patient Details Name: Sierra Wright MRN: [id number] DOB: 03/08/1934   Cancelled Treatment:    Reason Eval/Treat Not Completed: Patient at procedure or test/ unavailable. Will follow up for OT eval as time allows.  Gaye AlkenBailey A Cayle Cordoba M.S., OTR/L Pager: [phone]  07/14/2016, 9:07 AM

## 2016-07-15 ENCOUNTER — Inpatient Hospital Stay (HOSPITAL_COMMUNITY): Payer: Medicare Other

## 2016-07-15 DIAGNOSIS — I638 Other cerebral infarction: Secondary | ICD-10-CM

## 2016-07-15 LAB — CBC
HCT: 37.7 % (ref 36.0–46.0)
Hemoglobin: 12.3 g/dL (ref 12.0–15.0)
MCH: 30.4 pg (ref 26.0–34.0)
MCHC: 32.6 g/dL (ref 30.0–36.0)
MCV: 93.1 fL (ref 78.0–100.0)
PLATELETS: 158 10*3/uL (ref 150–400)
RBC: 4.05 MIL/uL (ref 3.87–5.11)
RDW: 12.8 % (ref 11.5–15.5)
WBC: 7.5 10*3/uL (ref 4.0–10.5)

## 2016-07-15 LAB — BASIC METABOLIC PANEL
Anion gap: 11 (ref 5–15)
BUN: 15 mg/dL (ref 6–20)
CO2: 23 mmol/L (ref 22–32)
CREATININE: 0.85 mg/dL (ref 0.44–1.00)
Calcium: 8.6 mg/dL — ABNORMAL LOW (ref 8.9–10.3)
Chloride: 104 mmol/L (ref 101–111)
Glucose, Bld: 157 mg/dL — ABNORMAL HIGH (ref 65–99)
Potassium: 3.9 mmol/L (ref 3.5–5.1)
SODIUM: 138 mmol/L (ref 135–145)

## 2016-07-15 LAB — LIPID PANEL
CHOLESTEROL: 151 mg/dL (ref 0–200)
HDL: 39 mg/dL — AB (ref >40)
LDL Cholesterol: 91 mg/dL (ref 0–99)
TRIGLYCERIDES: 106 mg/dL (ref <150)
Total CHOL/HDL Ratio: 3.9 RATIO
VLDL: 21 mg/dL (ref 0–40)

## 2016-07-15 LAB — GLUCOSE, CAPILLARY
Glucose-Capillary: 145 mg/dL — ABNORMAL HIGH (ref 65–99)
Glucose-Capillary: 155 mg/dL — ABNORMAL HIGH (ref 65–99)
Glucose-Capillary: 191 mg/dL — ABNORMAL HIGH (ref 65–99)

## 2016-07-15 LAB — URINE CULTURE

## 2016-07-15 LAB — MAGNESIUM: MAGNESIUM: 2.1 mg/dL (ref 1.7–2.4)

## 2016-07-15 MED ORDER — METOPROLOL TARTRATE 25 MG PO TABS: 12.5000 mg | ORAL_TABLET | Freq: Two times a day (BID) | ORAL | 3 refills | Status: DC

## 2016-07-15 MED ORDER — IBUPROFEN 400 MG PO TABS: 400.0000 mg | ORAL_TABLET | Freq: Four times a day (QID) | ORAL | 0 refills | Status: DC | PRN

## 2016-07-15 MED ORDER — GADOBENATE DIMEGLUMINE 529 MG/ML IV SOLN: 20.0000 mL | Freq: Once | INTRAVENOUS | Status: DC | PRN

## 2016-07-15 MED ORDER — CLOPIDOGREL BISULFATE 75 MG PO TABS: 75.0000 mg | ORAL_TABLET | Freq: Every day | ORAL | 3 refills | Status: DC

## 2016-07-15 NOTE — Progress Notes (Deleted)
STROKE TEAM PROGRESS NOTE   HISTORY OF PRESENT ILLNESS (per record) Sierra Wright is an 82 y.o. female who reports that she got out of bed around 3 AM to go to the bathroom. When she did she appears to have slipped and fallen. She now presents with a complaint of some back pain but no focal neurological deficits. The family reports that she walks with a walker. They note she has a baseline unsteady gait.   SUBJECTIVE (INTERVAL HISTORY) She denies any numbness.  In the past few months, she has had numbness,tingling, and tightness in the feet and legs at night with an urge to move them interfering with her sleep.  MRI Brain showed right thalamic ischemic infarct.  TTE was normal.  CDUS is pending.     OBJECTIVE Temp:  [98.2 F (36.8 C)-98.9 F (37.2 C)] 98.3 F (36.8 C) (08/13 0414) Pulse Rate:  [65-88] 66 (08/13 0414) Cardiac Rhythm: Heart block (08/12 1900) Resp:  [17-20] 18 (08/13 0414) BP: (116-161)/(45-83) 116/83 (08/13 0414) SpO2:  [98 %-100 %] 100 % (08/13 0414)  CBC:  Recent Labs Lab 07/13/16 0714  07/14/16 0706 07/15/16 0343  WBC 9.5  < > 8.0 7.5  NEUTROABS 6.4  --   --   --   HGB 13.0  < > 13.8 12.3  HCT 39.1  < > 42.6 37.7  MCV 93.3  < > 94.0 93.1  PLT 163  < > 191 158  < > = values in this interval not displayed.  Basic Metabolic Panel:   Recent Labs Lab 07/13/16 1322 07/14/16 0706 07/14/16 0920 07/15/16 0343  NA  --  139  --  138  K  --  3.6  --  3.9  CL  --  104  --  104  CO2  --  25  --  23  GLUCOSE  --  157*  --  157*  BUN  --  15  --  15  CREATININE 1.00 0.85  --  0.85  CALCIUM  --  9.2  --  8.6*  MG 1.6*  --  1.8 2.1  PHOS 2.9  --   --   --     Lipid Panel:     Component Value Date/Time   CHOL 151 07/15/2016 0343   TRIG 106 07/15/2016 0343   HDL 39 (L) 07/15/2016 0343   CHOLHDL 3.9 07/15/2016 0343   VLDL 21 07/15/2016 0343   LDLCALC 91 07/15/2016 0343   HgbA1c:  Lab Results  Component Value Date   HGBA1C 6.9 (H) 07/26/2015    Urine Drug Screen:     Component Value Date/Time   LABOPIA NONE DETECTED 07/14/2016 0008   COCAINSCRNUR NONE DETECTED 07/14/2016 0008   LABBENZ POSITIVE (A) 07/14/2016 0008   AMPHETMU NONE DETECTED 07/14/2016 0008   THCU NONE DETECTED 07/14/2016 0008   LABBARB NONE DETECTED 07/14/2016 0008      IMAGING  Dg Chest 2 View 07/14/2016 No acute cardiopulmonary process.    Dg Ribs Unilateral W/chest Right 07/13/2016 1. No evidence of acute rib fracture.  2. Advanced right glenohumeral joint osteoarthritis.    Dg Forearm Left 07/13/2016 No acute forearm fracture.    Ct Cervical Spine Wo Contrast 07/13/2016 1. No acute intracranial abnormality or significant interval change.  2. Stable atrophy and white matter disease. This likely reflects the sequela of chronic microvascular ischemia.  3. Moderate spondylosis in the cervical spine as described.  4. No acute fracture or traumatic subluxation in the  cervical spine.    Mr Laqueta JeanBrain W Or Wo Contrast 07/13/2016 1. 9 mm early subacute ischemic nonhemorrhagic infarct involving the medial right thalamus.   No associated mass effect.  2. No other acute intracranial process identified.  3. Mild age-related cerebral atrophy with chronic small vessel ischemic disease.     Dg Hand Complete Left 07/13/2016 No convincing evidence of acute fracture. Advanced multifocal degenerative osteoarthritis including at the STT [facility] in the region of the patient's clinical symptoms.    Ct Head Code Stroke W/o Cm 07/13/2016 1. No acute intracranial abnormality or significant interval change.  2. Stable atrophy and white matter disease. This likely reflects the sequela of chronic microvascular ischemia.  3. Moderate spondylosis in the cervical spine as described.  4. No acute fracture or traumatic subluxation in the cervical spine.     PHYSICAL EXAM  Awake, alert, fully oriented.  Language-fluent, Comprehension, naming, repetition-  intact. Face symmetric. Tongue midline. EOMI. Strength 5/5 bilaterally. Pinprick sensation is intact and symmetrical in the face, arms, and legs. Coord- FTN is intact.     ASSESSMENT/PLAN Sierra Wright is a 82 y.o. female with history of hypertension, hard of hearing, diabetic neuropathy, diabetes mellitus, coronary artery disease, and status post coronary artery bypass graft surgery presenting with a recent fall with unsteady gait. She did not receive IV t-PA due to minimal deficits.  Stroke:  Non-dominant infarct secondary to small vessel disease.   MRI - 9 mm early subacute ischemic nonhemorrhagic infarct involving the medial right thalamus.   MRA  - not performed  Carotid Doppler - 1-39% ICA plaquing.  Vertebral artery flow is antegrade.   2D Echo - pending  LDL - pending  HgbA1c pending  VTE prophylaxis - Lovenox Diet heart healthy/carb modified Room service appropriate? Yes; Fluid consistency: Thin  aspirin 81 mg daily prior to admission, now on aspirin 325 mg daily  Patient counseled to be compliant with her antithrombotic medications  Ongoing aggressive stroke risk factor management  Therapy recommendations:  Outpatient physical therapy recommended with 24-hour per day supervision.  Disposition:  Pending  Hypertension  Stable  Permissive hypertension (OK if < 220/120) but gradually normalize in 5-7 days  Long-term BP goal normotensive  Hyperlipidemia  Home meds:  Statin allergy  LDL pending, goal < 70    Diabetes  HgbA1c pending, goal < 7.0  Unc / Controlled  Other Stroke Risk Factors  Advanced age  Obesity, Body mass index is 33.28 kg/m., recommend weight loss, diet and exercise as appropriate   Coronary artery disease   Other Active Problems  Second degree AV block on telemetry / bradycardia - consider cardiology consult - consider holding beta blocker  Hospital day # 1  1. Acute right thalamic ischemic infarct without any  clear residual.  BP needs to be controlled better, so recommend adjusting antihypertensives to that effect after 24 hours.  She was already on ASA 81 mg qd, and studies have shown 325 mg qd is no better for secondary stroke prevention, so will change to Plavix 75 mg qd.    2. Probable diabetic polyneuropathy with secondary restless legs syndrome.  This can be addressed as outpatient by a neurologist.    Weston SettleShervin Eshraghi, MD    To contact Stroke Continuity provider, please refer to [email or web address]. After hours, contact General Neurology

## 2016-07-15 NOTE — Progress Notes (Signed)
Pt d/c to home by car with family. Assessment stable. Prescriptions given. Cm has left for the evening. Message left with charge rn to tell day shift cm that pt needs outpatient pt set up. All questions answered

## 2016-07-15 NOTE — Discharge Summary (Signed)
Physician Discharge Summary  Sierra Wright ZOX:[id number] DOB: [birth year] DOA: 07/13/2016  PCP: Emeterio Reeve, MD  Admit date: 07/13/2016 Discharge date: 07/15/2016  Time spent: 65 minutes  Recommendations for Outpatient Follow-up:  1. Follow-up with Emeterio Reeve, MD in 2 weeks. On follow-up patient blood pressure need to be reassessed. Patient will need a basic metabolic profile done to follow-up on electrolytes and renal function. 2. Follow-up with Dr. Pearlean Brownie of neurology 2 months.   Discharge Diagnoses:  Principal Problem:   CVA (cerebral infarction): Early subacute Active Problems:   DM type 2 goal A1C below 7.5   Binocular vision disorder with diplopia   Weakness   Back pain   Essential hypertension   2Nd degree AV block   Thyroid activity decreased   Coronary artery disease involving coronary bypass graft of native heart without angina pectoris   Discharge Condition: Stable and improved  Diet recommendation: Heart healthy  Filed Weights   07/13/16 0638  Weight: 90.7 kg (200 lb)    History of present illness:  Per Dr Dartha Lodge  82 year old female with history of weakness, restless leg syndrome, , CAD, HTN and DM. Patient presented with dizziness that started earlier on day of admission with reported double vision. On further questioning, patient described the dizziness as weakness. Patient has been for a while. The double vision had resolved according to the patient. Patient was seen by Dr. Hilda Blades, the Neurologist (Kindly see the documentation). MRI of throax and lumbar spine has been advised by the Neurologist. Patient is back to her baseline. No headache, no neck pain, no chest pain, no GI symptoms and no urinary symptoms.  Hospital Course:  #1 9 mm early subacute ischemic nonhemorrhagic infarct involving right thalamus Per MRI. Patient had presented with dizziness and double vision leading to a fall. Patient Improved clinically during the hospitalization and  patient's diplopia had resolved as well as dizziness. Patient stated back pain had improved. Neurology was consulted and followed the patient throughout the hospitalization. Patient underwent stroke workup. tPA not given due to resolution of symptoms.  2-D echo was done with a EF of 60-65% with no wall motion abnormalities. No source of emboli. Carotid Dopplers with no significant ICA stenosis. Fasting lipid panel with LDL of 91. Patient with an allergy to statins and a such was not placed on a statin. Hemoglobin A1c which was done came back at 6.9. Patient was seen by PT/OT/ST bladder recommended outpatient physical therapy. Patient was on aspirin prior to admission and this was changed to Plavix per neurology recommendations. Patient be discharged home in stable and improved condition and will follow-up with Dr. Pearlean Brownie of neurology in 2 months.  #2 back pain Secondary to fall. Patient back pain improved significantly during the hospitalization. Outpatient follow-up.   #3 diabetes mellitus type 2 Hemoglobin A1c 6.9. Patient was maintained on a sliding scale insulin.  #4 second degree AV block Noted on telemetry occasionally. Patient denied any chest pain. Patient did present with some dizziness however dizziness likely attribute it to problem #1. Patient on a beta blocker. Cardiology, Dr. Wyline Mood was curb sided and he reviewed the telemetry and noted that patient had second-degree AV block type I with no evidence of high-grade block recommended decreasing patient's Lopressor to 12.5 mg twice a day. Outpatient follow-up.   #5 hypothyroidism Continued on home dose Synthroid.  #6 hypokalemia Repleted.  #7 hypertension Patient was maintained on Norvasc, benazepril, hydralazine, Lopressor. Patient's Lopressor dose was decreased to 12.5 twice daily  secondary to occasional second-degree AV block type I that was seen on telemetry. Patient had no high grade block.  #8 coronary artery disease status  post CABG Stable. Patient denied any chest pain. 2-D echo with EF of 60-65% with no wall motion abnormalities. Continued on home regimen of benazepril, hydralazine, Lopressor.    Procedures:  2-D echo 07/14/2016  Chest x-ray 07/14/2016  CT C-spine 07/13/2016  MRI brain 07/13/2016  Carotid Dopplers 07/14/2016   Consultations:  Neurology: Dr. Hilda Blades 07/13/2016  Cardiology Dr. Wyline Mood 07/14/2016   Discharge Exam: Vitals:   07/15/16 0414 07/15/16 1420  BP: 116/83 120/78  Pulse: 66 70  Resp: 18 18  Temp: 98.3 F (36.8 C) 98 F (36.7 C)    General: NAD Cardiovascular: RRR Respiratory: CTAB  Discharge Instructions   Discharge Instructions    Ambulatory referral to Physical Therapy    Complete by:  As directed   Diet - low sodium heart healthy    Complete by:  As directed   Discharge instructions    Complete by:  As directed   fOLLOW UP WITH dR sETHI IN 2 MONTHS. fOLLOW UP WITH Emeterio Reeve, MD IN 2 WEEKS.   Increase activity slowly    Complete by:  As directed     Current Discharge Medication List    START taking these medications   Details  clopidogrel (PLAVIX) 75 MG tablet Take 1 tablet (75 mg total) by mouth daily. Qty: 30 tablet, Refills: 3    ibuprofen (ADVIL,MOTRIN) 400 MG tablet Take 1 tablet (400 mg total) by mouth every 6 (six) hours as needed for fever, mild pain, moderate pain or cramping. Qty: 30 tablet, Refills: 0      CONTINUE these medications which have CHANGED   Details  metoprolol tartrate (LOPRESSOR) 25 MG tablet Take 0.5 tablets (12.5 mg total) by mouth 2 (two) times daily. Qty: 60 tablet, Refills: 3      CONTINUE these medications which have NOT CHANGED   Details  amLODipine (NORVASC) 5 MG tablet Take 1 tablet (5 mg total) by mouth daily. Qty: 30 tablet, Refills: 11    Ascorbic Acid (VITAMIN C ER PO) Take 1,000 mg by mouth daily.     benazepril (LOTENSIN) 40 MG tablet Take 1 tablet (40 mg total) by mouth daily. Qty:  30 tablet, Refills: 11    Calcium Carb-Cholecalciferol (CALCIUM 600/VITAMIN D3) 600-800 MG-UNIT TABS Take 1 tablet by mouth 2 (two) times daily.    diazepam (VALIUM) 10 MG tablet Take 10 mg by mouth every 6 (six) hours as needed for anxiety.    fish oil-omega-3 fatty acids 1000 MG capsule Take 1-2 g by mouth 3 (three) times daily. 1 capsule morning and evening , 2 capsules at lunch    furosemide (LASIX) 40 MG tablet TAKE ONE TABLET BY MOUTH TWICE DAILY. Qty: 60 tablet, Refills: 6    Garlic 1000 MG CAPS Take 1,000 mg by mouth daily.     glimepiride (AMARYL) 4 MG tablet Take 4 mg by mouth daily with breakfast.     hydrALAZINE (APRESOLINE) 25 MG tablet TAKE ONE TABLET BY MOUTH THREE TIMES DAILY Qty: 90 tablet, Refills: 11    levothyroxine (SYNTHROID, LEVOTHROID) 112 MCG tablet Take 112 mcg by mouth daily before breakfast.    metFORMIN (GLUCOPHAGE) 500 MG tablet Take 500 mg by mouth 3 (three) times daily.     potassium chloride (K-DUR,KLOR-CON) 10 MEQ tablet Take 10 mEq by mouth 4 (four) times daily.    vitamin B-12 (  CYANOCOBALAMIN) 1000 MCG tablet Take 1,000 mcg by mouth daily.      STOP taking these medications     aspirin 81 MG tablet        Allergies  Allergen Reactions  . Statins Other (See Comments)    Causes myalgias  . Amitriptyline Other (See Comments)    Pt. States that it caused it to be suicidal.   . Cena BentonWelchol [Colesevelam Hcl] Other (See Comments)    Causes dizziness   Follow-up Information    Emeterio ReeveWOLTERS,SHARON A, MD. Schedule an appointment as soon as possible for a visit in 2 week(s).   Specialty:  Family Medicine Contact information: [address] [place] [address] [phone]        Delia HeadySETHI,PRAMOD, MD. Schedule an appointment as soon as possible for a visit in 2 month(s).   Specialties:  Neurology, Radiology Contact information: [address] [place] [address] [phone]            The results of  significant diagnostics from this hospitalization (including imaging, microbiology, ancillary and laboratory) are listed below for reference.    Significant Diagnostic Studies: Dg Chest 2 View  Result Date: 07/14/2016 CLINICAL DATA:  Patient with worsening shortness of breath for multiple months. EXAM: CHEST  2 VIEW COMPARISON:  Chest radiograph 07/13/2016 FINDINGS: Monitoring leads overlie the patient. Stable cardiac and mediastinal contours status post median sternotomy. Tortuosity and calcification of thoracic aorta. No consolidative pulmonary opacities. No pleural effusion or pneumothorax. Right shoulder joint degenerative changes. Thoracic spine degenerative changes. IMPRESSION: No acute cardiopulmonary process. Electronically Signed   By: Annia Beltrew  Davis M.D.   On: 07/14/2016 09:29   Dg Ribs Unilateral W/chest Right  Result Date: 07/13/2016 CLINICAL DATA:  82 year old female with right-sided rib pain status post fall EXAM: RIGHT RIBS AND CHEST - 3+ VIEW COMPARISON:  Prior chest x-ray 04/24/2007 FINDINGS: No evidence of displaced rib fracture. Advanced degenerative osteoarthritis at the right glenohumeral joint. Patient is status post median sternotomy with evidence of prior multivessel CABG. Surgical clips in the right upper quadrant suggest prior cholecystectomy. Stable mild chronic bronchitic change. No suspicious mass or nodule. IMPRESSION: 1. No evidence of acute rib fracture. 2. Advanced right glenohumeral joint osteoarthritis. Electronically Signed   By: Malachy MoanHeath  McCullough M.D.   On: 07/13/2016 08:04   Dg Forearm Left  Result Date: 07/13/2016 CLINICAL DATA:  Larey SeatFell this morning.  Left forearm pain. EXAM: LEFT FOREARM - 2 VIEW COMPARISON:  None. FINDINGS: Degenerative changes are noted at the wrist with settling of the scapholunate into the distal radius. There is also widening of the scapholunate joint space suggesting ligamentous insufficiency. The elbow joint is maintained. Small olecranon spur.  No acute forearm fracture. IMPRESSION: No acute forearm fracture. Electronically Signed   By: Rudie MeyerP.  Gallerani M.D.   On: 07/13/2016 08:03   Ct Cervical Spine Wo Contrast  Result Date: 07/13/2016 CLINICAL DATA:  New onset dizziness. Patient fell upon waking at 5 a.m. today. EXAM: CT HEAD WITHOUT CONTRAST CT CERVICAL SPINE WITHOUT CONTRAST TECHNIQUE: Multidetector CT imaging of the head and cervical spine was performed following the standard protocol without intravenous contrast. Multiplanar CT image reconstructions of the cervical spine were also generated. COMPARISON:  CT head without contrast 05/27/2014. FINDINGS: CT HEAD FINDINGS Moderate subcortical white matter hypoattenuation is again seen bilaterally without significant interval change. No acute infarct, hemorrhage, or mass lesion is present. The insular ribbon is within normal limits bilaterally. The basal ganglia are intact. No focal  cortical infarct is evident. Mild generalized atrophy is present. The ventricles are of normal size. No significant extra-axial fluid collection is present. No significant extracranial soft tissue injury is present. The calvarium is intact. The paranasal sinuses and mastoid air cells are clear. Atherosclerotic calcifications are again noted at the cavernous internal carotid arteries at the dural margin of both vertebral arteries. Bilateral lens replacements are present. The globes and orbits are otherwise intact. CT CERVICAL SPINE FINDINGS The cervical spine is imaged and skull base through T1-2. Vertebral body heights and alignment are normal. No acute fracture traumatic subluxation is present. Multilevel facet degenerative change is present. This is most significant on the right at C3-4 and on the left at C4-5. Osseous foraminal narrowing is worse on the left at C4-5 and on the right at C3-4. IMPRESSION: 1. No acute intracranial abnormality or significant interval change. 2. Stable atrophy and white matter disease. This  likely reflects the sequela of chronic microvascular ischemia. 3. Moderate spondylosis in the cervical spine as described. 4. No acute fracture or traumatic subluxation in the cervical spine. Electronically Signed   By: Marin Roberts M.D.   On: 07/13/2016 08:29   Mr Angiogram Neck W Contrast  Result Date: 07/15/2016 CLINICAL DATA:  Stroke EXAM: MRA NECK WITHOUT AND WITH CONTRAST MRA HEAD WITHOUT CONTRAST TECHNIQUE: Multiplanar and multiecho pulse sequences of the neck were obtained without and with intravenous contrast. Angiographic images of the neck were obtained using MRA technique without and with intravenous contast.; Angiographic images of the Circle of Willis were obtained using MRA technique without intravenous contrast. CONTRAST:  20 mL MultiHance IV COMPARISON:  MRI head 07/13/2016 FINDINGS: MRA NECK FINDINGS Antegrade flow in the carotid and vertebral artery bilaterally Mild atherosclerotic disease in the right carotid bulb. 30% diameter stenosis. No other significant stenosis in the right carotid Atherosclerotic disease in the left carotid bulb narrowing the lumen by 25% diameter stenosis. No other significant stenosis in the left carotid Both vertebral arteries are patent to the basilar. There is moderate stenosis distal left vertebral artery and mild stenosis distal right vertebral artery. MRA HEAD FINDINGS Moderate stenosis left vertebral artery V3 segment. Mild disease distal right vertebral artery. Focal moderate stenosis distal basilar artery. Atherosclerotic disease involving the PICA bilaterally. Superior cerebellar and posterior cerebral arteries patent bilaterally. Moderate stenosis mid left posterior cerebral artery. Fetal origin left posterior cerebral artery. Mild disease right posterior cerebral artery. Mild atherosclerotic disease in the cavernous carotid bilaterally without significant stenosis. Focal severe stenosis right A2 segment. Left anterior cerebral artery widely  patent. Mild disease in right middle cerebral artery branches. Right M1 segment widely patent. Left M1 segment widely patent. Focal moderate stenosis of the parietal branch of the left middle cerebral artery. Negative for cerebral aneurysm. IMPRESSION: Mild atherosclerotic disease in the carotid bulb with 30% diameter stenosis on the right and 25% diameter stenosis on the left Moderate stenosis distal left vertebral artery due to atherosclerotic disease. Moderate intracranial atherosclerotic disease as described above. Electronically Signed   By: Marlan Palau M.D.   On: 07/15/2016 10:43   Mr Brain W Or Wo Contrast  Result Date: 07/13/2016 CLINICAL DATA:  Initial evaluation for fine ocular diplopia and ataxia. EXAM: MRI HEAD WITHOUT AND WITH CONTRAST TECHNIQUE: Multiplanar, multiecho pulse sequences of the brain and surrounding structures were obtained without and with intravenous contrast. CONTRAST:  20mL MULTIHANCE GADOBENATE DIMEGLUMINE 529 MG/ML IV SOLN COMPARISON:  Prior CT from earlier the same day. FINDINGS: Diffuse prominence of the CSF  containing spaces compatible with generalized age-related cerebral atrophy. Patchy T2/FLAIR hyperintensity within the periventricular and deep white matter both cerebral hemispheres present, most like related to chronic small vessel ischemic disease, mild for age. There is any 9 mm focus of abnormal restricted diffusion involving the medial right thalamus, extending towards the right cerebral peduncle, compatible with a small early subacute ischemic infarct (series 3, image 27). No associated hemorrhage or mass effect. No other acute infarct. Gray-white matter differentiation otherwise maintained. Major intracranial vascular flow voids are preserved. No areas of chronic infarction. No mass lesion, midline shift, or mass effect. No hydrocephalus. No abnormal enhancement. Major dural sinuses are patent. No extra-axial fluid collection. Craniocervical junction normal.  Visualized upper cervical spine unremarkable without significant degenerative changes are stenosis. Pituitary gland within normal limits. No acute abnormality about the globes and orbits. Patient is status post lens extraction bilaterally. Mild mucosal thickening within the ethmoidal air cells. Paranasal sinuses are otherwise clear. No mastoid effusion. Inner ear structures within normal limits. Bone marrow signal intensity normal. Mild hyperostosis frontalis interna noted. No scalp soft tissue abnormality. IMPRESSION: 1. 9 mm early subacute ischemic nonhemorrhagic infarct involving the medial right thalamus. No associated mass effect. 2. No other acute intracranial process identified. 3. Mild age-related cerebral atrophy with chronic small vessel ischemic disease. Electronically Signed   By: Rise Mu M.D.   On: 07/13/2016 23:23   Dg Hand Complete Left  Result Date: 07/13/2016 CLINICAL DATA:  82 year old female with left thumb pain, bruising and swelling after falling earlier this morning EXAM: LEFT HAND - COMPLETE 3+ VIEW COMPARISON:  Concurrently obtained radiographs of the left forearm FINDINGS: No evidence of acute fracture or malalignment. Degenerative osteoarthritis is present at the first carpometacarpal joint and throughout the distal interphalangeal joints and the third fourth and fifth proximal interphalangeal joint. Degenerative changes are quite pronounced at the proximal interphalangeal joints of the digits 4 and 5 with at least partial bony ankylosis. The bones appear diffusely demineralized. Mild degenerative change at the STT joint. IMPRESSION: No convincing evidence of acute fracture. Advanced multifocal degenerative osteoarthritis including at the STT and first [facility] joint in the region of the patient's clinical symptoms. Electronically Signed   By: Malachy Moan M.D.   On: 07/13/2016 08:02   Mr Maxine Glenn Head/brain ZO Cm  Result Date: 07/15/2016 CLINICAL DATA:  Stroke EXAM: MRA  NECK WITHOUT AND WITH CONTRAST MRA HEAD WITHOUT CONTRAST TECHNIQUE: Multiplanar and multiecho pulse sequences of the neck were obtained without and with intravenous contrast. Angiographic images of the neck were obtained using MRA technique without and with intravenous contast.; Angiographic images of the Circle of Willis were obtained using MRA technique without intravenous contrast. CONTRAST:  20 mL MultiHance IV COMPARISON:  MRI head 07/13/2016 FINDINGS: MRA NECK FINDINGS Antegrade flow in the carotid and vertebral artery bilaterally Mild atherosclerotic disease in the right carotid bulb. 30% diameter stenosis. No other significant stenosis in the right carotid Atherosclerotic disease in the left carotid bulb narrowing the lumen by 25% diameter stenosis. No other significant stenosis in the left carotid Both vertebral arteries are patent to the basilar. There is moderate stenosis distal left vertebral artery and mild stenosis distal right vertebral artery. MRA HEAD FINDINGS Moderate stenosis left vertebral artery V3 segment. Mild disease distal right vertebral artery. Focal moderate stenosis distal basilar artery. Atherosclerotic disease involving the PICA bilaterally. Superior cerebellar and posterior cerebral arteries patent bilaterally. Moderate stenosis mid left posterior cerebral artery. Fetal origin left posterior cerebral artery. Mild disease  right posterior cerebral artery. Mild atherosclerotic disease in the cavernous carotid bilaterally without significant stenosis. Focal severe stenosis right A2 segment. Left anterior cerebral artery widely patent. Mild disease in right middle cerebral artery branches. Right M1 segment widely patent. Left M1 segment widely patent. Focal moderate stenosis of the parietal branch of the left middle cerebral artery. Negative for cerebral aneurysm. IMPRESSION: Mild atherosclerotic disease in the carotid bulb with 30% diameter stenosis on the right and 25% diameter stenosis  on the left Moderate stenosis distal left vertebral artery due to atherosclerotic disease. Moderate intracranial atherosclerotic disease as described above. Electronically Signed   By: Marlan Palau M.D.   On: 07/15/2016 10:43   Ct Head Code Stroke W/o Cm  Result Date: 07/13/2016 CLINICAL DATA:  New onset dizziness. Patient fell upon waking at 5 a.m. today. EXAM: CT HEAD WITHOUT CONTRAST CT CERVICAL SPINE WITHOUT CONTRAST TECHNIQUE: Multidetector CT imaging of the head and cervical spine was performed following the standard protocol without intravenous contrast. Multiplanar CT image reconstructions of the cervical spine were also generated. COMPARISON:  CT head without contrast 05/27/2014. FINDINGS: CT HEAD FINDINGS Moderate subcortical white matter hypoattenuation is again seen bilaterally without significant interval change. No acute infarct, hemorrhage, or mass lesion is present. The insular ribbon is within normal limits bilaterally. The basal ganglia are intact. No focal cortical infarct is evident. Mild generalized atrophy is present. The ventricles are of normal size. No significant extra-axial fluid collection is present. No significant extracranial soft tissue injury is present. The calvarium is intact. The paranasal sinuses and mastoid air cells are clear. Atherosclerotic calcifications are again noted at the cavernous internal carotid arteries at the dural margin of both vertebral arteries. Bilateral lens replacements are present. The globes and orbits are otherwise intact. CT CERVICAL SPINE FINDINGS The cervical spine is imaged and skull base through T1-2. Vertebral body heights and alignment are normal. No acute fracture traumatic subluxation is present. Multilevel facet degenerative change is present. This is most significant on the right at C3-4 and on the left at C4-5. Osseous foraminal narrowing is worse on the left at C4-5 and on the right at C3-4. IMPRESSION: 1. No acute intracranial  abnormality or significant interval change. 2. Stable atrophy and white matter disease. This likely reflects the sequela of chronic microvascular ischemia. 3. Moderate spondylosis in the cervical spine as described. 4. No acute fracture or traumatic subluxation in the cervical spine. Electronically Signed   By: Marin Roberts M.D.   On: 07/13/2016 08:29    Microbiology: Recent Results (from the past 240 hour(s))  Urine culture     Status: Abnormal   Collection Time: 07/14/16  7:38 PM  Result Value Ref Range Status   Specimen Description URINE, RANDOM  Final   Special Requests NONE  Final   Culture MULTIPLE SPECIES PRESENT, SUGGEST RECOLLECTION (A)  Final   Report Status 07/15/2016 FINAL  Final     Labs: Basic Metabolic Panel:  Recent Labs Lab 07/13/16 0714 07/13/16 0724 07/13/16 1322 07/14/16 0706 07/14/16 0920 07/15/16 0343  NA 135 136  --  139  --  138  K 3.9 3.9  --  3.6  --  3.9  CL 101 100*  --  104  --  104  CO2 25  --   --  25  --  23  GLUCOSE 151* 145*  --  157*  --  157*  BUN 21* 24*  --  15  --  15  CREATININE 0.91 0.90  1.00 0.85  --  0.85  CALCIUM 9.4  --   --  9.2  --  8.6*  MG  --   --  1.6*  --  1.8 2.1  PHOS  --   --  2.9  --   --   --    Liver Function Tests:  Recent Labs Lab 07/13/16 0714  AST 18  ALT 19  ALKPHOS 52  BILITOT 0.9  PROT 5.6*  ALBUMIN 3.5   No results for input(s): LIPASE, AMYLASE in the last 168 hours. No results for input(s): AMMONIA in the last 168 hours. CBC:  Recent Labs Lab 07/13/16 0714 07/13/16 0724 07/13/16 1322 07/14/16 0706 07/15/16 0343  WBC 9.5  --  9.2 8.0 7.5  NEUTROABS 6.4  --   --   --   --   HGB 13.0 13.6 13.0 13.8 12.3  HCT 39.1 40.0 39.7 42.6 37.7  MCV 93.3  --  92.5 94.0 93.1  PLT 163  --  166 191 158   Cardiac Enzymes: No results for input(s): CKTOTAL, CKMB, CKMBINDEX, TROPONINI in the last 168 hours. BNP: BNP (last 3 results) No results for input(s): BNP in the last 8760 hours.  ProBNP  (last 3 results) No results for input(s): PROBNP in the last 8760 hours.  CBG:  Recent Labs Lab 07/14/16 1631 07/14/16 2118 07/15/16 0606 07/15/16 1134 07/15/16 1658  GLUCAP 133* 158* 145* 155* 191*       Signed:  THOMPSON,DANIEL MD.  [facility] 07/15/2016, 5:49 PM

## 2016-07-15 NOTE — Progress Notes (Signed)
STROKE TEAM PROGRESS NOTE   HISTORY OF PRESENT ILLNESS (per record) Sierra Wright is an 82 y.o. female who reports that she got out of bed around 3 AM to go to the bathroom. When she did she appears to have slipped and fallen. She now presents with a complaint of some back pain but no focal neurological deficits. The family reports that she walks with a walker. They note she has a baseline unsteady gait.   SUBJECTIVE (INTERVAL HISTORY) She denies any numbness.  In the past few months, she has had numbness,tingling, and tightness in the feet and legs at night with an urge to move them interfering with her sleep.  MRI Brain showed right thalamic ischemic infarct.  TTE was normal.  CDUS is pending.     OBJECTIVE Temp:  [98.3 F (36.8 C)-98.9 F (37.2 C)] 98.3 F (36.8 C) (08/13 0414) Pulse Rate:  [65-79] 66 (08/13 0414) Cardiac Rhythm: Normal sinus rhythm;Heart block;Bundle branch block (08/13 0726) Resp:  [17-20] 18 (08/13 0414) BP: (116-161)/(45-83) 116/83 (08/13 0414) SpO2:  [98 %-100 %] 100 % (08/13 0414)  CBC:  Recent Labs Lab 07/13/16 0714  07/14/16 0706 07/15/16 0343  WBC 9.5  < > 8.0 7.5  NEUTROABS 6.4  --   --   --   HGB 13.0  < > 13.8 12.3  HCT 39.1  < > 42.6 37.7  MCV 93.3  < > 94.0 93.1  PLT 163  < > 191 158  < > = values in this interval not displayed.  Basic Metabolic Panel:   Recent Labs Lab 07/13/16 1322 07/14/16 0706 07/14/16 0920 07/15/16 0343  NA  --  139  --  138  K  --  3.6  --  3.9  CL  --  104  --  104  CO2  --  25  --  23  GLUCOSE  --  157*  --  157*  BUN  --  15  --  15  CREATININE 1.00 0.85  --  0.85  CALCIUM  --  9.2  --  8.6*  MG 1.6*  --  1.8 2.1  PHOS 2.9  --   --   --     Lipid Panel:     Component Value Date/Time   CHOL 151 07/15/2016 0343   TRIG 106 07/15/2016 0343   HDL 39 (L) 07/15/2016 0343   CHOLHDL 3.9 07/15/2016 0343   VLDL 21 07/15/2016 0343   LDLCALC 91 07/15/2016 0343   HgbA1c:  Lab Results  Component Value  Date   HGBA1C 6.9 (H) 07/26/2015   Urine Drug Screen:     Component Value Date/Time   LABOPIA NONE DETECTED 07/14/2016 0008   COCAINSCRNUR NONE DETECTED 07/14/2016 0008   LABBENZ POSITIVE (A) 07/14/2016 0008   AMPHETMU NONE DETECTED 07/14/2016 0008   THCU NONE DETECTED 07/14/2016 0008   LABBARB NONE DETECTED 07/14/2016 0008      IMAGING  Dg Chest 2 View 07/14/2016 No acute cardiopulmonary process.    Dg Ribs Unilateral W/chest Right 07/13/2016 1. No evidence of acute rib fracture.  2. Advanced right glenohumeral joint osteoarthritis.    Dg Forearm Left 07/13/2016 No acute forearm fracture.    Ct Cervical Spine Wo Wright 07/13/2016 1. No acute intracranial abnormality or significant interval change.  2. Stable atrophy and white matter disease. This likely reflects the sequela of chronic microvascular ischemia.  3. Moderate spondylosis in the cervical spine as described.  4. No acute fracture or  traumatic subluxation in the cervical spine.    Mr Sierra Wright 07/13/2016 1. 9 mm early subacute ischemic nonhemorrhagic infarct involving the medial right thalamus.   No associated mass effect.  2. No other acute intracranial process identified.  3. Mild age-related cerebral atrophy with chronic small vessel ischemic disease.     Dg Hand Complete Left 07/13/2016 No convincing evidence of acute fracture. Advanced multifocal degenerative osteoarthritis including at the STT and [facility] in the region of the patient's clinical symptoms.    Ct Head Code Stroke W/o Cm 07/13/2016 1. No acute intracranial abnormality or significant interval change.  2. Stable atrophy and white matter disease. This likely reflects the sequela of chronic microvascular ischemia.  3. Moderate spondylosis in the cervical spine as described.  4. No acute fracture or traumatic subluxation in the cervical spine.     PHYSICAL EXAM  Awake, alert, fully oriented.  Language-fluent,  Comprehension, naming, repetition- intact. Face symmetric. Tongue midline. EOMI. Strength 5/5 bilaterally. Pinprick sensation is intact and symmetrical in the face, arms, and legs. Coord- FTN is intact.     ASSESSMENT/PLAN Sierra Wright is a 82 y.o. female with history of hypertension, hard of hearing, diabetic neuropathy, diabetes mellitus, coronary artery disease, and status post coronary artery bypass graft surgery presenting with a recent fall with unsteady gait. She did not receive IV t-PA due to minimal deficits.  Stroke:  Non-dominant infarct secondary to small vessel disease.   MRI - 9 mm early subacute ischemic nonhemorrhagic infarct involving the medial right thalamus.   MRA  - not performed  Carotid Doppler - 1-39% ICA plaquing.  Vertebral artery flow is antegrade.   2D Echo - EF 60-65%. No cardiac source of emboli identified.  LDL - 91  HgbA1c pending  VTE prophylaxis - Lovenox Diet heart healthy/carb modified Room service appropriate? Yes; Fluid consistency: Thin  aspirin 81 mg daily prior to admission, now on aspirin 325 mg daily  Patient counseled to be compliant with her antithrombotic medications  Ongoing aggressive stroke risk factor management  Therapy recommendations:  Outpatient physical therapy recommended with 24-hour per day supervision.  Disposition:  Pending  Hypertension  Stable  Permissive hypertension (OK if < 220/120) but gradually normalize in 5-7 days  Long-term BP goal normotensive  Hyperlipidemia  Home meds:  Statin allergy  LDL 91, goal < 70    Diabetes  HgbA1c pending, goal < 7.0  Unc / Controlled  Other Stroke Risk Factors  Advanced age  Obesity, Body mass index is 33.28 kg/m., recommend weight loss, diet and exercise as appropriate   Coronary artery disease   Other Active Problems  Second degree AV block on telemetry / bradycardia - Per cardiology - decrease metoprolol - D/C if no  improvement.  Hospital day # 1  1. Acute right thalamic ischemic infarct without any clear residual.  BP is better, but still not optimal.  Consider increasing Norvasc to 10 mg qd if needed. Tolerating  Plavix well.  CDUS pending.   2. Probable diabetic polyneuropathy with secondary restless legs syndrome.  This can be addressed as outpatient by a neurologist.    Weston SettleShervin Bane Hagy, MD    To contact Stroke Continuity provider, please refer to [email or web address]. After hours, contact General Neurology

## 2016-07-15 NOTE — Discharge Instructions (Signed)
Stroke Prevention °Some health problems and behaviors may make it more likely for you to have a stroke. Below are ways to lessen your risk of having a stroke.  °· Be active for at least 30 minutes on most or all days. °· Do not smoke. Try not to be around others who smoke. °· Do not drink too much alcohol. °¨ Do not have more than 2 drinks a day if you are a man. °¨ Do not have more than 1 drink a day if you are a woman and are not pregnant. °· Eat healthy foods, such as fruits and vegetables. If you were put on a specific diet, follow the diet as told. °· Keep your cholesterol levels under control through diet and medicines. Look for foods that are low in saturated fat, trans fat, cholesterol, and are high in fiber. °· If you have diabetes, follow all diet plans and take your medicine as told. °· Ask your doctor if you need treatment to lower your blood pressure. If you have high blood pressure (hypertension), follow all diet plans and take your medicine as told by your doctor. °· If you are 18-39 years old, have your blood pressure checked every 3-5 years. If you are age 40 or older, have your blood pressure checked every year. °· Keep a healthy weight. Eat foods that are low in calories, salt, saturated fat, trans fat, and cholesterol. °· Do not take drugs. °· Avoid birth control pills, if this applies. Talk to your doctor about the risks of taking birth control pills. °· Talk to your doctor if you have sleep problems (sleep apnea). °· Take all medicine as told by your doctor. °¨ You may be told to take aspirin or blood thinner medicine. Take this medicine as told by your doctor. °¨ Understand your medicine instructions. °· Make sure any other conditions you have are being taken care of. °GET HELP RIGHT AWAY IF: °· You suddenly lose feeling (you feel numb) or have weakness in your face, arm, or leg. °· Your face or eyelid hangs down to one side. °· You suddenly feel confused. °· You have trouble talking (aphasia)  or understanding what people are saying. °· You suddenly have trouble seeing in one or both eyes. °· You suddenly have trouble walking. °· You are dizzy. °· You lose your balance or your movements are clumsy (uncoordinated). °· You suddenly have a very bad headache and you do not know the cause. °· You have new chest pain. °· Your heart feels like it is fluttering or skipping a beat (irregular heartbeat). °Do not wait to see if the symptoms above go away. Get help right away. Call your local emergency services (911 in [place] Do not drive yourself to the hospital. °  °This information is not intended to replace advice given to you by your health care provider. Make sure you discuss any questions you have with your health care provider. °  °Document Released: 05/20/2012 Document Revised: 12/10/2014 Document Reviewed: 05/22/2013 °Elsevier Interactive Patient Education ©2016 [employer] ° °

## 2016-07-16 LAB — HEMOGLOBIN A1C
HEMOGLOBIN A1C: 6.5 % — AB (ref 4.8–5.6)
Mean Plasma Glucose: 140 mg/dL

## 2016-07-16 LAB — VAS US CAROTID
LEFT ECA DIAS: -2 cm/s
LEFT VERTEBRAL DIAS: -4 cm/s
Left CCA dist dias: -12 cm/s
Left CCA dist sys: -65 cm/s
Left CCA prox dias: 11 cm/s
Left CCA prox sys: 97 cm/s
Left ICA dist dias: -22 cm/s
Left ICA dist sys: -68 cm/s
Left ICA prox dias: -16 cm/s
Left ICA prox sys: -76 cm/s
RIGHT ECA DIAS: -1 cm/s
RIGHT VERTEBRAL DIAS: 12 cm/s
Right CCA prox dias: 16 cm/s
Right CCA prox sys: 88 cm/s
Right cca dist sys: -115 cm/s

## 2016-07-26 ENCOUNTER — Ambulatory Visit (INDEPENDENT_AMBULATORY_CARE_PROVIDER_SITE_OTHER): Payer: Medicare Other | Admitting: Neurology

## 2016-07-26 ENCOUNTER — Encounter: Payer: Self-pay | Admitting: Neurology

## 2016-07-26 VITALS — BP 118/68 | HR 65 | Ht 65.0 in | Wt 201.4 lb

## 2016-07-26 DIAGNOSIS — G5621 Lesion of ulnar nerve, right upper limb: Secondary | ICD-10-CM | POA: Diagnosis not present

## 2016-07-26 DIAGNOSIS — E0842 Diabetes mellitus due to underlying condition with diabetic polyneuropathy: Secondary | ICD-10-CM | POA: Diagnosis not present

## 2016-07-26 DIAGNOSIS — I639 Cerebral infarction, unspecified: Secondary | ICD-10-CM | POA: Insufficient documentation

## 2016-07-26 DIAGNOSIS — R51 Headache: Secondary | ICD-10-CM | POA: Diagnosis not present

## 2016-07-26 DIAGNOSIS — G2581 Restless legs syndrome: Secondary | ICD-10-CM | POA: Insufficient documentation

## 2016-07-26 DIAGNOSIS — R519 Headache, unspecified: Secondary | ICD-10-CM

## 2016-07-26 MED ORDER — TIZANIDINE HCL 2 MG PO CAPS: 2.0000 mg | ORAL_CAPSULE | Freq: Every day | ORAL | 5 refills | Status: DC

## 2016-07-26 MED ORDER — CARBIDOPA-LEVODOPA 25-100 MG PO TABS: ORAL_TABLET | ORAL | 5 refills | Status: DC

## 2016-07-26 NOTE — Progress Notes (Signed)
[facility] Neurology [facility] Clinic Note - Initial Visit   Date: 07/26/16  Sierra Wright MRN: [id number] DOB: 1934/10/11   Dear Dr. Paulino Rily:  Thank you for your kind referral of Sierra Wright for consultation of RLS, headaches, and recent stroke. Although her history is well known to you, please allow [place] to reiterate it for the purpose of our medical record. The patient was accompanied to the clinic by husband who also provides collateral information.     History of Present Illness: Sierra Wright is a 82 y.o. right-handed Caucasian female with diabetes mellitus, hypertension, hyperlipidemia, hypothyroidism, CAD s/p CABG, depression/anxiety, CKD stage III, hearing deficit, and recent R thalamic ischemic stroke (no residual deficits) presenting for evaluation of RLS, headaches and recent stroke.    She was evaluated by his PCP on July 26th for complains of involuntary leg movements at night and referred her for further evaluation.  She reports having a long history of leg discomfort, described as achy and irritating sensation which is worse at rest.  She sometimes finds herself walking to ease the discomfort, but whenever she rests, symptoms always get worse again. She has tried requip and Lyrica but this did not help.   She has numbness of the legs which is more noticeable at nighttime.  She does not have burning or tingling pain.  She has previously tried trazadone, but developed headaches.  She has been using a walker for 3-4 years.    She also complains of entire right hand numbness which is worse at night.  There is no weakness or neck pain.   She also complains of frontal headaches, described as dull ache, which can last all day.  She has endorses mild nausea.  No photophobia and phonophobia.  She sometimes feels lightheaded and dizzy with the headaches.  She takes Aleve which does not provide much relief.    In the interim, she was hospitalized on 8/11 for low back  pain after a fall she suffered while getting out of bed to use the restroom at 3am.  Her plain film imaging did not show any fracture, however, because of concern of facial droop and double vision and gait difficulty MRI brian was obtained and showed a subacute right thalamic ischemic infarct, likely due to small vessel disease.  US carotids and TEE was unremarkable.  Telemetry showed second degree AV block with bradycardia, so her betablocker was reduced to Lopressor 12.5mg  BID and out-patient follow-up was recommended.  Her aspirin 81mg  was switched to plavix 75mg  daily.  She is not on statin therapy de to statin allergy.  Out-side paper records, electronic medical record, and images have been reviewed where available and summarized as:  Lab Results  Component Value Date   TSH 1.037 07/13/2016   Lab Results  Component Value Date   HGBA1C 6.5 (H) 07/15/2016   MRA brain and neck 07/15/2016: Mild atherosclerotic disease in the carotid bulb with 30% diameter stenosis on the right and 25% diameter stenosis on the left   Moderate stenosis distal left vertebral artery due to atherosclerotic disease.   Moderate intracranial atherosclerotic disease as described above. MRI brain 07/13/2016: 1. 9 mm early subacute ischemic nonhemorrhagic infarct involving the medial right thalamus. No associated mass effect. 2. No other acute intracranial process identified. 3. Mild age-related cerebral atrophy with chronic small vessel ischemic disease.  CT cervical spine 07/13/2016: 1. No acute intracranial abnormality or significant interval change. 2. Stable atrophy and white matter disease. This likely reflects  the sequela of chronic microvascular ischemia. 3. Moderate spondylosis in the cervical spine as described. 4. No acute fracture or traumatic subluxation in the cervical spine.  CT head 07/13/2016: 1. No acute intracranial abnormality or significant interval change. 2. Stable atrophy and white matter  disease. This likely reflects the sequela of chronic microvascular ischemia. 3. Moderate spondylosis in the cervical spine as described. 4. No acute fracture or traumatic subluxation in the cervical spine.   US carotids 1-39% bilaterally Echo: Normal   Past Medical History:  Diagnosis Date  . Arthritis   . Coronary artery disease 1999   CABG  . Depression   . Diabetes mellitus without complication (HCC)    type II; metformin and Amaryl  . Diabetic neuropathy (HCC)   . History of anemia as a child   . HOH (hard of hearing)   . Hypertension   . Hypothyroidism    synthroid  . PONV (postoperative nausea and vomiting)   . Restless leg syndrome   . Shortness of breath 09/03/12   ECHO- The LA is Moderately Dilated, mild mitral annular calcification. mild pulmonary hypertension, moderate concentric LV hypertrophy. Atrial septum is aneurysmal. Aortic valve appears to be mildly sclerotic.EF 88%  . Urinary incontinence   . Weakness of both legs     Past Surgical History:  Procedure Laterality Date  . ABDOMINAL HYSTERECTOMY    . APPENDECTOMY    . BACK SURGERY    . CARDIAC CATHERIZATION  08/20/03   Widely patent bypass grafts. Normal left ventricular systolic function.  Marland Kitchen. CARDIAC CATHETERIZATION    . CHOLECYSTECTOMY    . COLONOSCOPY    . CORONARY ARTERY BYPASS GRAFT    . EYE SURGERY     bilateral cataracts  . LUMBAR LAMINECTOMY/DECOMPRESSION MICRODISCECTOMY N/A 07/26/2015   Procedure: BILATERAL Lumbar 3-4 SUBTOTAL HEMILAMINECTOMY WITH LATERAL RECESS DECOMPRESSION;  Surgeon: Kerrin ChampagneJames E Nitka, MD;  Location: [facility] OR;  Service: Orthopedics;  Laterality: N/A;  . SHOULDER SURGERY    . TUBAL LIGATION       Medications:  Outpatient Encounter Prescriptions as of 07/26/2016  Medication Sig  . amLODipine (NORVASC) 5 MG tablet Take 1 tablet (5 mg total) by mouth daily.  . Ascorbic Acid (VITAMIN C ER PO) Take 1,000 mg by mouth daily.   . benazepril (LOTENSIN) 40 MG tablet Take 1 tablet (40 mg  total) by mouth daily.  . Calcium Carb-Cholecalciferol (CALCIUM 600/VITAMIN D3) 600-800 MG-UNIT TABS Take 1 tablet by mouth 2 (two) times daily.  . clopidogrel (PLAVIX) 75 MG tablet Take 1 tablet (75 mg total) by mouth daily.  . diazepam (VALIUM) 10 MG tablet Take 10 mg by mouth every 6 (six) hours as needed for anxiety.  . fish oil-omega-3 fatty acids 1000 MG capsule Take 1-2 g by mouth 3 (three) times daily. 1 capsule morning and evening , 2 capsules at lunch  . furosemide (LASIX) 40 MG tablet TAKE ONE TABLET BY MOUTH TWICE DAILY. (Patient taking differently: TAKE ONE TABLET BY MOUTH  DAILY.)  . Garlic 1000 MG CAPS Take 1,000 mg by mouth daily.   Marland Kitchen. glimepiride (AMARYL) 4 MG tablet Take 4 mg by mouth daily with breakfast.   . hydrALAZINE (APRESOLINE) 25 MG tablet TAKE ONE TABLET BY MOUTH THREE TIMES DAILY  . ibuprofen (ADVIL,MOTRIN) 400 MG tablet Take 1 tablet (400 mg total) by mouth every 6 (six) hours as needed for fever, mild pain, moderate pain or cramping.  Marland Kitchen. levothyroxine (SYNTHROID, LEVOTHROID) 112 MCG tablet Take 112 mcg by  mouth daily before breakfast.  . metFORMIN (GLUCOPHAGE) 500 MG tablet Take 500 mg by mouth 3 (three) times daily.   . metoprolol tartrate (LOPRESSOR) 25 MG tablet Take 0.5 tablets (12.5 mg total) by mouth 2 (two) times daily.  . potassium chloride (K-DUR,KLOR-CON) 10 MEQ tablet Take 10 mEq by mouth 4 (four) times daily.  . vitamin B-12 (CYANOCOBALAMIN) 1000 MCG tablet Take 1,000 mcg by mouth daily.  . carbidopa-levodopa (SINEMET IR) 25-100 MG tablet Take half tablet at bedtime for 3 days, then increase to 1 tablet at bedtime.  . tizanidine (ZANAFLEX) 2 MG capsule Take 1 capsule (2 mg total) by mouth at bedtime.   No facility-administered encounter medications on file as of 07/26/2016.      Allergies:  Allergies  Allergen Reactions  . Statins Other (See Comments)    Causes myalgias  . Amitriptyline Other (See Comments)    Pt. States that it caused it to be  suicidal.   . Cena Benton Hcl] Other (See Comments)    Causes dizziness    Family History: Family History  Problem Relation Age of Onset  . Other Mother   . Pneumonia Mother   . Heart attack Father   . Liver cancer Sister   . Stroke Sister   . Heart attack Brother 56  . Stroke Son 23  . Healthy Daughter     Social History: Social History  Substance Use Topics  . Smoking status: Never Smoker  . Smokeless tobacco: Never Used  . Alcohol use No   Social History   Social History Narrative   Lives with husband in a 1 story home.  Has 1 daughter.   Education: high school.     Worked as a Futures trader.     Review of Systems:  CONSTITUTIONAL: No fevers, chills, night sweats, or weight loss.   EYES: No visual changes or eye pain ENT: No hearing changes.  No history of nose bleeds.   RESPIRATORY: No cough, wheezing and shortness of breath.   CARDIOVASCULAR: Negative for chest pain, and palpitations.   GI: Negative for abdominal discomfort, blood in stools or black stools.  No recent change in bowel habits.   GU:  No history of incontinence.   MUSCLOSKELETAL: +history of joint pain +swelling.  No myalgias.   SKIN: Negative for lesions, rash, and itching.   HEMATOLOGY/ONCOLOGY: Negative for prolonged bleeding, bruising easily, and swollen nodes.  No history of cancer.   ENDOCRINE: Negative for cold or heat intolerance, polydipsia or goiter.   PSYCH:  +depression or anxiety symptoms.   NEURO: As Above.   Vital Signs:  BP 118/68   Pulse 65   Ht 5\' 5"  (1.651 m)   Wt 201 lb 6 oz (91.3 kg)   SpO2 98%   BMI 33.51 kg/m    General Medical Exam:   General:  Well appearing, comfortable in transport chair.   Eyes/ENT: see cranial nerve examination.   Neck: No masses appreciated.  Full range of motion without tenderness.  No carotid bruits. Respiratory:  Clear to auscultation, good air entry bilaterally.   Cardiac:  Regular rate and rhythm, no murmur.    Neurological  Exam: MENTAL STATUS including orientation to time, place, person, recent and remote memory, attention span and concentration, language, and fund of knowledge is normal.  Speech is not dysarthric.  CRANIAL NERVES: II:  No visual field defects.  Unremarkable fundi.   III-IV-VI: Pupils equal round and reactive to light.  Normal conjugate, extra-ocular eye movements  in all directions of gaze.  No nystagmus.  No ptosis.   V:  Normal facial sensation.   VII:  Normal facial symmetry and movements.  No pathologic facial reflexes.  VIII:  Normal hearing and vestibular function.   IX-X:  Normal palatal movement.   XI:  Normal shoulder shrug and head rotation.   XII:  Normal tongue strength and range of motion, no deviation or fasciculation.  MOTOR:  No atrophy, fasciculations or abnormal movements.  No pronator drift.  Tone is normal.    Right Upper Extremity:    Left Upper Extremity:    Deltoid  5/5   Deltoid  5/5   Biceps  5/5   Biceps  5/5   Triceps  5/5   Triceps  5/5   Wrist extensors  5/5   Wrist extensors  5/5   Wrist flexors  5/5   Wrist flexors  5/5   Finger extensors  5/5   Finger extensors  5/5   Finger flexors  5/5   Finger flexors  5/5   Dorsal interossei  5/5   Dorsal interossei  5/5   Abductor pollicis  5/5   Abductor pollicis  5/5   Tone (Ashworth scale)  0  Tone (Ashworth scale)  0   Right Lower Extremity:    Left Lower Extremity:    Hip flexors  5/5   Hip flexors  5/5   Hip extensors  5/5   Hip extensors  5/5   Knee flexors  5/5   Knee flexors  5/5   Knee extensors  5/5   Knee extensors  5/5   Dorsiflexors  5/5   Dorsiflexors  5/5   Plantarflexors  5/5   Plantarflexors  5/5   Toe extensors  5/5   Toe extensors  5/5   Toe flexors  4/5   Toe flexors  5-/5   Tone (Ashworth scale)  0  Tone (Ashworth scale)  0   MSRs:  Right                                                                 Left brachioradialis 2+  brachioradialis 2+  biceps 2+  biceps 2+  triceps 2+   triceps 2+  patellar 1+  Patellar 1+  ankle jerk 0  ankle jerk 0  Hoffman no  Hoffman no  plantar response mute  plantar response mute   SENSORY:  Sensation to all modalities is absent distal to ankles bilaterally.  There is marked sensory ataxia with Rhomberg testing.  COORDINATION/GAIT: Normal finger-to- nose-finger and heel-to-shin.  Intact rapid alternating movements bilaterally.  Able to rise from a chair without using arms.  Gait narrow based and stable. Tandem and stressed gait intact.    IMPRESSION: 1.  Right thalamic ischemic stroke, no residual deficits  - Etiology:  Small vessel disease  - Continue plavix 75mg  daily   - Secondary risk prevention with plavix 75mg  daily  - She has statin allergy (LDL 91)  - Diabetes is well controlled with HbA1c 6.5  - BP is well-controlled, want to avoid low blood pressure due to her orthostasis  2.  Restless leg syndrome vs periodic limb movement of sleep  - Previously tried ropinorole and Lyrica  - Trial of sinemet to  see if this helps as this can be helpful for both RLS and periodic limb movement, other options include neurontin and pramipexole  - Start sinemet 25/100mg  half tablet at bedtime x 3 days, then increase to 1 tablet, patient informed of side effects  3.  Diabetic polyneuropathy with distal sensory loss, sensory ataxia, and orthostasis  - Symptoms mostly numbness without painful paresthesias and orthostasis   - Recommend that she start wearing compression stockings  - Increase water intake   - Fall precautions discussed, always use rollator  - PT previously with no benefit  4.  Chronic tension headaches  - Start tizanidine 2mg  at bedtime.  Common side effects discussed   5.  Right ulnar neuropathy is suspected  - Avoid hyperflexion at the elbow   - NCS/EMG if symptoms get worse  Return to clinic in 4 months.   The duration of this appointment visit was 70 minutes of face-to-face time with the patient.  Greater than  50% of this time was spent in counseling, explanation of diagnosis, planning of further management, and coordination of care.   Thank you for allowing me to participate in patient's care.  If I can answer any additional questions, I would be pleased to do so.    Sincerely,    Jeni Duling K. Allena Katz, DO

## 2016-07-26 NOTE — Patient Instructions (Addendum)
1.  Start tizanidine 2mg  at bedtime for headaches. 2.  After one week, start sinemet 25/100 half tablet at bedtime for 3 days, then increase to 1 tablet at bedtime for restless leg 3.  If you hand tingling gets worse, call my office and we can do nerve testing 4.  Continue your other medications as you are taking 5.  Start compression stockings 6.  Increase your water intake   Return to clinic in 4 months  [facility]  Preventing Falls in the Home   Falls are common, often dreaded events in the lives of older people. Aside from the obvious injuries and even death that may result, falls can cause wide-ranging consequences including loss of independence, mental decline, decreased activity, and mobility. Younger people are also at risk of falling, especially those with chronic illnesses and fatigue.  Ways to reduce the risk for falling:  * Examine diet and medications. Warm foods and alcohol dilate blood vessels, which can lead to dizziness when standing. Sleep aids, antidepressants, and pain medications can also increase the likelihood of a fall.  * Get a vison exam. Poor vision, cataracts, and glaucoma increase the chances of falling.  * Check foot gear. Shoes should fit snugly and have a sturdy, nonskid sole and broad, low heel.  * Participate in a physician-approved exercise program to build and maintain muscle strength and improve balance and coordination.  * Increase vitamin D intake. Vitamin D improves muscle strength and increases the amount of calcium the body is able to absorb and deposit in bones.  How to prevent falls from common hazards:  * Floors - Remove all loose wires, cords, and throw rugs. Minimize clutter. Make sure rugs are anchored and smooth. Keep furniture in its usual place.  * Chairs - Use chairs with straight backs, armrests, and firm seats. Add firm cushions to existing pieces to add height.  * Bathroom - Install grab bars and non-skid tape in the tub or shower.  Use a bathtub transfer bench or a shower chair with a back support. Use an elevated toilet seat and/or safety rails to assist standing from a low surface. Do not use towel racks or bathroom tissue holders to help you stand.  * Lighting - Make sure halls, stairways, and entrances are well-lit. Install a night light in your bathroom or hallway. Make sure there is a light switch at the top and bottom of the staircase. Turn lights on if you get up in the middle of the night. Make sure lamps or light switches are within reach of the bed if you have to get up during the night.  * Kitchen - Install non-skid rubber mats near the sink and stove. Clean spills immediately. Store frequently used utensils, pots, and pans between waist and eye level. This helps prevent reaching and bending. Sit when getting things out of the lower cupboards.  * Living room / Bedrooms - Place furniture with wide spaces in between, giving enough room to move around. Establish a route through the living room that gives you something to hold onto as you walk.  * Stairs - Make sure treads, rails, and rugs are secure. Install a rail on both sides of the stairs. If stairs are a threat, it might be helpful to arrange most of your activities on the lower level to reduce the number of times you must climb the stairs.  * Entrances and doorways - Install metal handles on the walls adjacent to the doorknobs of all  doors to make it more secure as you travel through the doorway.  Tips for maintaining balance:  * Keep at least one hand free at all times Try using a backpack or fanny pack to hold things rather than carrying them in your hands. Never carry objects in both hands when walking as this interferes with keeping your balance.  * Attempt to swing both arms from front to back while walking. This might require a conscious effort if Parkinson's disease has diminished your movement. It will, however, help you to maintain balance and posture, and  reduce fatigue.  * Consciously lift your feet off the ground when walking. Shuffling and dragging of the feet is a common culprit in losing your balance.  * When trying to navigate turns, use a "U" technique of facing forward and making a wide turn, rather than pivoting sharply.  * Try to stand with your feet shoulder-length apart. When your feet are close together for any length of time, you increase your risk of losing your balance and falling.  * Do one thing at a time. Do not try to walk and accomplish another task, such as reading or looking around. The decrease in your automatic reflexes complicates motor function, so the less distraction, the better.  * Do not wear rubber or gripping soled shoes, they might "catch" on the floor and cause tripping.  * Move slowly when changing positions. Use deliberate, concentrated movements and, if needed, use a grab bar or walking aid. Count fifteen (15) seconds after standing to begin walking.  * If balance is a continuous problem, you might want to consider a walking aid such as a cane, walking stick, or walker. Once you have mastered walking with help, you may be ready to try it again on your own.  This information is provided by [facility] and is not intended to replace the medical advice of your physician or other health care providers. Please consult your physician or other health care providers for advice regarding your specific medical condition.

## 2016-08-01 ENCOUNTER — Other Ambulatory Visit: Payer: Self-pay | Admitting: *Deleted

## 2016-08-01 ENCOUNTER — Telehealth: Payer: Self-pay | Admitting: *Deleted

## 2016-08-01 MED ORDER — GABAPENTIN 100 MG PO CAPS: 100.0000 mg | ORAL_CAPSULE | Freq: Every day | ORAL | 1 refills | Status: DC

## 2016-08-01 NOTE — Telephone Encounter (Signed)
Patient called and said that the tizanidine is not working for her.  It is making her headaches worse and when she takes it at bedtime with food it is making her sugar level go up about 30 points.  Please advise.

## 2016-08-01 NOTE — Telephone Encounter (Signed)
OK to stop tizanidine.  We can try gabapentin 100mg  at bedtime instead. Side effects include sleepiness and lightheadedness, but we are starting very low dose.  After one week, increase gabapentin to 200mg  at bedtime (take 2 tab).  Did she mention if the sinemet is helping with her RLS?  Sierra K. Allena KatzPatel, DO

## 2016-08-01 NOTE — Telephone Encounter (Signed)
Left message for patient to call me back. 

## 2016-08-01 NOTE — Telephone Encounter (Signed)
Patient notified and Rx sent

## 2016-08-24 ENCOUNTER — Ambulatory Visit: Payer: Medicare Other | Admitting: Neurology

## 2016-09-01 ENCOUNTER — Encounter (HOSPITAL_COMMUNITY): Payer: Self-pay | Admitting: Physical Medicine and Rehabilitation

## 2016-09-01 ENCOUNTER — Emergency Department (HOSPITAL_COMMUNITY): Payer: Medicare Other

## 2016-09-01 ENCOUNTER — Observation Stay (HOSPITAL_COMMUNITY)
Admission: EM | Admit: 2016-09-01 | Discharge: 2016-09-02 | Disposition: A | Discharge status: Home / Self Care | Payer: Medicare Other | Attending: Internal Medicine | Admitting: Internal Medicine

## 2016-09-01 DIAGNOSIS — E1140 Type 2 diabetes mellitus with diabetic neuropathy, unspecified: Secondary | ICD-10-CM | POA: Insufficient documentation

## 2016-09-01 DIAGNOSIS — Z79899 Other long term (current) drug therapy: Secondary | ICD-10-CM | POA: Diagnosis not present

## 2016-09-01 DIAGNOSIS — R072 Precordial pain: Principal | ICD-10-CM | POA: Insufficient documentation

## 2016-09-01 DIAGNOSIS — K5730 Diverticulosis of large intestine without perforation or abscess without bleeding: Secondary | ICD-10-CM | POA: Insufficient documentation

## 2016-09-01 DIAGNOSIS — E039 Hypothyroidism, unspecified: Secondary | ICD-10-CM | POA: Diagnosis not present

## 2016-09-01 DIAGNOSIS — I25810 Atherosclerosis of coronary artery bypass graft(s) without angina pectoris: Secondary | ICD-10-CM | POA: Diagnosis not present

## 2016-09-01 DIAGNOSIS — E119 Type 2 diabetes mellitus without complications: Secondary | ICD-10-CM

## 2016-09-01 DIAGNOSIS — I5032 Chronic diastolic (congestive) heart failure: Secondary | ICD-10-CM | POA: Diagnosis not present

## 2016-09-01 DIAGNOSIS — Z7984 Long term (current) use of oral hypoglycemic drugs: Secondary | ICD-10-CM | POA: Insufficient documentation

## 2016-09-01 DIAGNOSIS — I10 Essential (primary) hypertension: Secondary | ICD-10-CM | POA: Diagnosis present

## 2016-09-01 DIAGNOSIS — I110 Hypertensive heart disease with heart failure: Secondary | ICD-10-CM | POA: Diagnosis not present

## 2016-09-01 DIAGNOSIS — I441 Atrioventricular block, second degree: Secondary | ICD-10-CM | POA: Diagnosis present

## 2016-09-01 DIAGNOSIS — I639 Cerebral infarction, unspecified: Secondary | ICD-10-CM | POA: Diagnosis present

## 2016-09-01 DIAGNOSIS — R079 Chest pain, unspecified: Secondary | ICD-10-CM | POA: Diagnosis present

## 2016-09-01 DIAGNOSIS — Z8673 Personal history of transient ischemic attack (TIA), and cerebral infarction without residual deficits: Secondary | ICD-10-CM | POA: Insufficient documentation

## 2016-09-01 DIAGNOSIS — Z951 Presence of aortocoronary bypass graft: Secondary | ICD-10-CM | POA: Diagnosis not present

## 2016-09-01 DIAGNOSIS — I2510 Atherosclerotic heart disease of native coronary artery without angina pectoris: Secondary | ICD-10-CM | POA: Insufficient documentation

## 2016-09-01 DIAGNOSIS — E1142 Type 2 diabetes mellitus with diabetic polyneuropathy: Secondary | ICD-10-CM | POA: Diagnosis not present

## 2016-09-01 LAB — COMPREHENSIVE METABOLIC PANEL
ALT: 12 U/L — AB (ref 14–54)
AST: 19 U/L (ref 15–41)
Albumin: 3.6 g/dL (ref 3.5–5.0)
Alkaline Phosphatase: 64 U/L (ref 38–126)
Anion gap: 11 (ref 5–15)
BUN: 27 mg/dL — AB (ref 6–20)
CHLORIDE: 104 mmol/L (ref 101–111)
CO2: 19 mmol/L — ABNORMAL LOW (ref 22–32)
CREATININE: 0.94 mg/dL (ref 0.44–1.00)
Calcium: 9 mg/dL (ref 8.9–10.3)
GFR calc Af Amer: >60 mL/min (ref >60)
GFR, EST NON AFRICAN AMERICAN: 56 mL/min — AB (ref >60)
GLUCOSE: 199 mg/dL — AB (ref 65–99)
POTASSIUM: 4.1 mmol/L (ref 3.5–5.1)
SODIUM: 134 mmol/L — AB (ref 135–145)
Total Bilirubin: 0.7 mg/dL (ref 0.3–1.2)
Total Protein: 5.7 g/dL — ABNORMAL LOW (ref 6.5–8.1)

## 2016-09-01 LAB — CBC WITH DIFFERENTIAL/PLATELET
Basophils Absolute: 0 10*3/uL (ref 0.0–0.1)
Basophils Relative: 0 %
EOS ABS: 0.1 10*3/uL (ref 0.0–0.7)
EOS PCT: 1 %
HCT: 42.6 % (ref 36.0–46.0)
Hemoglobin: 14 g/dL (ref 12.0–15.0)
LYMPHS ABS: 2.7 10*3/uL (ref 0.7–4.0)
LYMPHS PCT: 22 %
MCH: 30.6 pg (ref 26.0–34.0)
MCHC: 32.9 g/dL (ref 30.0–36.0)
MCV: 93.2 fL (ref 78.0–100.0)
MONO ABS: 0.9 10*3/uL (ref 0.1–1.0)
MONOS PCT: 8 %
Neutro Abs: 8.4 10*3/uL — ABNORMAL HIGH (ref 1.7–7.7)
Neutrophils Relative %: 69 %
PLATELETS: 180 10*3/uL (ref 150–400)
RBC: 4.57 MIL/uL (ref 3.87–5.11)
RDW: 12.8 % (ref 11.5–15.5)
WBC: 12.1 10*3/uL — AB (ref 4.0–10.5)

## 2016-09-01 LAB — CBC
HCT: 39.2 % (ref 36.0–46.0)
Hemoglobin: 12.7 g/dL (ref 12.0–15.0)
MCH: 30.5 pg (ref 26.0–34.0)
MCHC: 32.4 g/dL (ref 30.0–36.0)
MCV: 94 fL (ref 78.0–100.0)
PLATELETS: 159 10*3/uL (ref 150–400)
RBC: 4.17 MIL/uL (ref 3.87–5.11)
RDW: 12.8 % (ref 11.5–15.5)
WBC: 9.1 10*3/uL (ref 4.0–10.5)

## 2016-09-01 LAB — GLUCOSE, CAPILLARY
GLUCOSE-CAPILLARY: 123 mg/dL — AB (ref 65–99)
GLUCOSE-CAPILLARY: 228 mg/dL — AB (ref 65–99)

## 2016-09-01 LAB — I-STAT TROPONIN, ED: Troponin i, poc: 0 ng/mL (ref 0.00–0.08)

## 2016-09-01 LAB — TROPONIN I

## 2016-09-01 LAB — LIPASE, BLOOD: LIPASE: 20 U/L (ref 11–51)

## 2016-09-01 MED ORDER — GI COCKTAIL ~~LOC~~
30.0000 mL | Freq: Once | ORAL | Status: AC
  Administered 2016-09-01: 30 mL via ORAL
  Filled 2016-09-01: qty 30

## 2016-09-01 MED ORDER — BENAZEPRIL HCL 40 MG PO TABS
40.0000 mg | ORAL_TABLET | Freq: Every day | ORAL | Status: DC
  Administered 2016-09-02: 40 mg via ORAL
  Filled 2016-09-01: qty 1

## 2016-09-01 MED ORDER — DIAZEPAM 5 MG PO TABS: 10.0000 mg | ORAL_TABLET | Freq: Four times a day (QID) | ORAL | Status: DC | PRN

## 2016-09-01 MED ORDER — FUROSEMIDE 40 MG PO TABS: 40.0000 mg | ORAL_TABLET | Freq: Every day | ORAL | Status: DC

## 2016-09-01 MED ORDER — IOPAMIDOL (ISOVUE-300) INJECTION 61%
100.0000 mL | Freq: Once | INTRAVENOUS | Status: AC | PRN
  Administered 2016-09-01: 100 mL via INTRAVENOUS

## 2016-09-01 MED ORDER — METOPROLOL TARTRATE 12.5 MG HALF TABLET: 12.5000 mg | ORAL_TABLET | Freq: Two times a day (BID) | ORAL | Status: DC

## 2016-09-01 MED ORDER — AMLODIPINE BESYLATE 5 MG PO TABS
5.0000 mg | ORAL_TABLET | Freq: Every day | ORAL | Status: DC
  Administered 2016-09-01 – 2016-09-02 (×2): 5 mg via ORAL
  Filled 2016-09-01 (×2): qty 1

## 2016-09-01 MED ORDER — INSULIN ASPART 100 UNIT/ML ~~LOC~~ SOLN
0.0000 [IU] | Freq: Every day | SUBCUTANEOUS | Status: DC
  Administered 2016-09-01: 2 [IU] via SUBCUTANEOUS

## 2016-09-01 MED ORDER — ACETAMINOPHEN 325 MG PO TABS: 650.0000 mg | ORAL_TABLET | ORAL | Status: DC | PRN

## 2016-09-01 MED ORDER — CLOPIDOGREL BISULFATE 75 MG PO TABS
75.0000 mg | ORAL_TABLET | Freq: Every day | ORAL | Status: DC
  Administered 2016-09-02: 75 mg via ORAL
  Filled 2016-09-01: qty 1

## 2016-09-01 MED ORDER — CARBIDOPA-LEVODOPA 25-100 MG PO TABS
1.0000 | ORAL_TABLET | Freq: Every day | ORAL | Status: DC
  Administered 2016-09-01: 1 via ORAL
  Filled 2016-09-01: qty 1

## 2016-09-01 MED ORDER — BENAZEPRIL HCL 40 MG PO TABS: 40.0000 mg | ORAL_TABLET | Freq: Every day | ORAL | Status: DC

## 2016-09-01 MED ORDER — GABAPENTIN 100 MG PO CAPS
100.0000 mg | ORAL_CAPSULE | Freq: Every day | ORAL | Status: DC
  Administered 2016-09-01: 100 mg via ORAL
  Filled 2016-09-01 (×2): qty 1

## 2016-09-01 MED ORDER — SODIUM CHLORIDE 0.9 % IV BOLUS (SEPSIS)
500.0000 mL | Freq: Once | INTRAVENOUS | Status: AC
  Administered 2016-09-01: 13:00:00 via INTRAVENOUS

## 2016-09-01 MED ORDER — VITAMIN B-12 1000 MCG PO TABS
1000.0000 ug | ORAL_TABLET | Freq: Every day | ORAL | Status: DC
  Administered 2016-09-02: 1000 ug via ORAL
  Filled 2016-09-01: qty 1

## 2016-09-01 MED ORDER — POTASSIUM CHLORIDE CRYS ER 10 MEQ PO TBCR
10.0000 meq | EXTENDED_RELEASE_TABLET | Freq: Four times a day (QID) | ORAL | Status: DC
Start: 2016-09-01 — End: 2016-09-02
  Administered 2016-09-01 – 2016-09-02 (×3): 10 meq via ORAL
  Filled 2016-09-01 (×3): qty 1

## 2016-09-01 MED ORDER — ONDANSETRON HCL 4 MG/2ML IJ SOLN
4.0000 mg | Freq: Four times a day (QID) | INTRAMUSCULAR | Status: DC | PRN
  Filled 2016-09-01: qty 2

## 2016-09-01 MED ORDER — MORPHINE SULFATE (PF) 2 MG/ML IV SOLN: 2.0000 mg | INTRAVENOUS | Status: DC | PRN

## 2016-09-01 MED ORDER — METOPROLOL TARTRATE 12.5 MG HALF TABLET
12.5000 mg | ORAL_TABLET | Freq: Two times a day (BID) | ORAL | Status: DC
  Administered 2016-09-01 – 2016-09-02 (×2): 12.5 mg via ORAL
  Filled 2016-09-01 (×2): qty 1

## 2016-09-01 MED ORDER — CLOPIDOGREL BISULFATE 75 MG PO TABS: 75.0000 mg | ORAL_TABLET | Freq: Every day | ORAL | Status: DC

## 2016-09-01 MED ORDER — GARLIC 1000 MG PO CAPS: 1000.0000 mg | ORAL_CAPSULE | Freq: Every day | ORAL | Status: DC

## 2016-09-01 MED ORDER — TIZANIDINE HCL 2 MG PO TABS
2.0000 mg | ORAL_TABLET | Freq: Every day | ORAL | Status: DC
  Administered 2016-09-01: 2 mg via ORAL
  Filled 2016-09-01: qty 1

## 2016-09-01 MED ORDER — HYDRALAZINE HCL 25 MG PO TABS
25.0000 mg | ORAL_TABLET | Freq: Three times a day (TID) | ORAL | Status: DC
  Administered 2016-09-01 – 2016-09-02 (×3): 25 mg via ORAL
  Filled 2016-09-01 (×3): qty 1

## 2016-09-01 MED ORDER — LEVOTHYROXINE SODIUM 112 MCG PO TABS
112.0000 ug | ORAL_TABLET | Freq: Every day | ORAL | Status: DC
Start: 2016-09-02 — End: 2016-09-02
  Administered 2016-09-02: 112 ug via ORAL
  Filled 2016-09-01: qty 1

## 2016-09-01 MED ORDER — ONDANSETRON HCL 4 MG/2ML IJ SOLN
4.0000 mg | Freq: Three times a day (TID) | INTRAMUSCULAR | Status: DC | PRN
Start: 2016-09-01 — End: 2016-09-01

## 2016-09-01 MED ORDER — OMEGA-3-ACID ETHYL ESTERS 1 G PO CAPS
1.0000 g | ORAL_CAPSULE | Freq: Two times a day (BID) | ORAL | Status: DC
  Administered 2016-09-01 – 2016-09-02 (×2): 1 g via ORAL
  Filled 2016-09-01 (×2): qty 1

## 2016-09-01 MED ORDER — FUROSEMIDE 40 MG PO TABS
40.0000 mg | ORAL_TABLET | Freq: Every day | ORAL | Status: DC
  Administered 2016-09-02: 40 mg via ORAL
  Filled 2016-09-01: qty 1

## 2016-09-01 MED ORDER — GI COCKTAIL ~~LOC~~: 30.0000 mL | Freq: Four times a day (QID) | ORAL | Status: DC | PRN

## 2016-09-01 MED ORDER — OMEGA-3 FATTY ACIDS 1000 MG PO CAPS: 1.0000 g | ORAL_CAPSULE | Freq: Three times a day (TID) | ORAL | Status: DC

## 2016-09-01 MED ORDER — INSULIN ASPART 100 UNIT/ML ~~LOC~~ SOLN
0.0000 [IU] | Freq: Three times a day (TID) | SUBCUTANEOUS | Status: DC
Start: 2016-09-01 — End: 2016-09-02
  Administered 2016-09-01 – 2016-09-02 (×2): 2 [IU] via SUBCUTANEOUS
  Administered 2016-09-02: 5 [IU] via SUBCUTANEOUS

## 2016-09-01 MED ORDER — IBUPROFEN 200 MG PO TABS: 400.0000 mg | ORAL_TABLET | Freq: Four times a day (QID) | ORAL | Status: DC | PRN

## 2016-09-01 MED ORDER — ENOXAPARIN SODIUM 40 MG/0.4ML ~~LOC~~ SOLN
40.0000 mg | SUBCUTANEOUS | Status: DC
  Administered 2016-09-01: 40 mg via SUBCUTANEOUS
  Filled 2016-09-01: qty 0.4

## 2016-09-01 NOTE — ED Provider Notes (Signed)
[facility]-EMERGENCY DEPT Provider Note   CSN: [id number] Arrival date & time: 09/01/16  1024     History   Chief Complaint Chief Complaint  Patient presents with  . Chest Pain    HPI Sierra Wright is a 82 y.o. female.  HPI Patient reports to emergency department with midsternal chest pain and some shortness of breath began this morning around 8:00.  Patient denies any fever but has had nausea and midsternal chest pain for last several days off and on.  Has history of chronic congestive heart failure and has had Past Medical History:  Diagnosis Date  . Arthritis   . Coronary artery disease 1999   CABG  . Depression   . Diabetes mellitus without complication (HCC)    type II; metformin and Amaryl  . Diabetic neuropathy (HCC)   . History of anemia as a child   . HOH (hard of hearing)   . Hypertension   . Hypothyroidism    synthroid  . PONV (postoperative nausea and vomiting)   . Restless leg syndrome   . Shortness of breath 09/03/12   ECHO- The LA is Moderately Dilated, mild mitral annular calcification. mild pulmonary hypertension, moderate concentric LV hypertrophy. Atrial septum is aneurysmal. Aortic valve appears to be mildly sclerotic.EF 88%  . Urinary incontinence   . Weakness of both legs     Patient Active Problem List   Diagnosis Date Noted  . Chest pain 09/01/2016  . Diabetic polyneuropathy associated with diabetes mellitus due to underlying condition (HCC) 07/26/2016  . Chronic daily headache 07/26/2016  . RLS (restless legs syndrome) 07/26/2016  . Ulnar neuropathy of right upper extremity 07/26/2016  . Ischemic stroke (HCC) 07/26/2016  . CVA (cerebral infarction): Early subacute 07/14/2016  . Back pain 07/14/2016  . Essential hypertension   . 2Nd degree AV block   . Thyroid activity decreased   . Coronary artery disease involving coronary bypass graft of native heart without angina pectoris   . Binocular vision disorder with diplopia 07/13/2016  .  Weakness 07/13/2016  . Chronic diastolic CHF (congestive heart failure) (HCC) 04/16/2016  . Osteoporosis 07/27/2015    Class: Chronic  . Spinal stenosis, lumbar region, with neurogenic claudication 07/26/2015    Class: Chronic  . Spondylolisthesis of lumbar region 07/26/2015    Class: Chronic  . Hx of CABG 03/15/2014  . Osteoarthritis 03/15/2014  . DM type 2 goal A1C below 7.5 03/15/2014  . Peripheral edema 03/15/2014  . Benign hypertensive heart disease without heart failure 03/15/2014  . Right bundle branch block 03/15/2014  . First degree AV block 03/15/2014  . Sinus bradycardia by electrocardiogram 03/15/2014  . Dizziness 03/15/2014    Past Surgical History:  Procedure Laterality Date  . ABDOMINAL HYSTERECTOMY    . APPENDECTOMY    . BACK SURGERY    . CARDIAC CATHERIZATION  08/20/03   Widely patent bypass grafts. Normal left ventricular systolic function.  Marland Kitchen. CARDIAC CATHETERIZATION    . CHOLECYSTECTOMY    . COLONOSCOPY    . CORONARY ARTERY BYPASS GRAFT    . EYE SURGERY     bilateral cataracts  . LUMBAR LAMINECTOMY/DECOMPRESSION MICRODISCECTOMY N/A 07/26/2015   Procedure: BILATERAL Lumbar 3-4 SUBTOTAL HEMILAMINECTOMY WITH LATERAL RECESS DECOMPRESSION;  Surgeon: Kerrin ChampagneJames E Nitka, MD;  Location: [facility] OR;  Service: Orthopedics;  Laterality: N/A;  . SHOULDER SURGERY    . TUBAL LIGATION      OB History    No data available       Home  Medications    Prior to Admission medications   Medication Sig Start Date End Date Taking? Authorizing Provider  amLODipine (NORVASC) 5 MG tablet Take 1 tablet (5 mg total) by mouth daily. 05/07/16   Vesta Mixer, MD  Ascorbic Acid (VITAMIN C ER PO) Take 1,000 mg by mouth daily.     Historical Provider, MD  benazepril (LOTENSIN) 40 MG tablet Take 1 tablet (40 mg total) by mouth daily. 07/09/16   Vesta Mixer, MD  Calcium Carb-Cholecalciferol (CALCIUM 600/VITAMIN D3) 600-800 MG-UNIT TABS Take 1 tablet by mouth 2 (two) times daily.    Historical  Provider, MD  carbidopa-levodopa (SINEMET IR) 25-100 MG tablet Take half tablet at bedtime for 3 days, then increase to 1 tablet at bedtime. 07/26/16   Donika Concha Se, DO  clopidogrel (PLAVIX) 75 MG tablet Take 1 tablet (75 mg total) by mouth daily. 07/15/16   Rodolph Bong, MD  diazepam (VALIUM) 10 MG tablet Take 10 mg by mouth every 6 (six) hours as needed for anxiety.    Historical Provider, MD  fish oil-omega-3 fatty acids 1000 MG capsule Take 1-2 g by mouth 3 (three) times daily. 1 capsule morning and evening , 2 capsules at lunch    Historical Provider, MD  furosemide (LASIX) 40 MG tablet TAKE ONE TABLET BY MOUTH TWICE DAILY. Patient taking differently: TAKE ONE TABLET BY MOUTH  DAILY. 12/13/15   Cassell Clement, MD  gabapentin (NEURONTIN) 100 MG capsule Take 1 capsule (100 mg total) by mouth at bedtime. After one week, patient may increase to 200 mg qhs 08/01/16   Donika K Patel, DO  Garlic 1000 MG CAPS Take 1,000 mg by mouth daily.     Historical Provider, MD  glimepiride (AMARYL) 4 MG tablet Take 4 mg by mouth daily with breakfast.     Historical Provider, MD  hydrALAZINE (APRESOLINE) 25 MG tablet TAKE ONE TABLET BY MOUTH THREE TIMES DAILY 10/13/15   Cassell Clement, MD  ibuprofen (ADVIL,MOTRIN) 400 MG tablet Take 1 tablet (400 mg total) by mouth every 6 (six) hours as needed for fever, mild pain, moderate pain or cramping. 07/15/16   Rodolph Bong, MD  levothyroxine (SYNTHROID, LEVOTHROID) 112 MCG tablet Take 112 mcg by mouth daily before breakfast.    Historical Provider, MD  metFORMIN (GLUCOPHAGE) 500 MG tablet Take 500 mg by mouth 3 (three) times daily.     Historical Provider, MD  metoprolol tartrate (LOPRESSOR) 25 MG tablet Take 0.5 tablets (12.5 mg total) by mouth 2 (two) times daily. 07/15/16   Rodolph Bong, MD  potassium chloride (K-DUR,KLOR-CON) 10 MEQ tablet Take 10 mEq by mouth 4 (four) times daily. 03/15/14   Cassell Clement, MD  tizanidine (ZANAFLEX) 2 MG capsule Take  1 capsule (2 mg total) by mouth at bedtime. 07/26/16   Donika Concha Se, DO  vitamin B-12 (CYANOCOBALAMIN) 1000 MCG tablet Take 1,000 mcg by mouth daily.    Historical Provider, MD    Family History Family History  Problem Relation Age of Onset  . Other Mother   . Pneumonia Mother   . Heart attack Father   . Liver cancer Sister   . Stroke Sister   . Heart attack Brother 56  . Stroke Son 38  . Healthy Daughter     Social History Social History  Substance Use Topics  . Smoking status: Never Smoker  . Smokeless tobacco: Never Used  . Alcohol use No     Allergies   Statins; Amitriptyline;  and Welchol [colesevelam hcl]   Review of Systems Review of Systems  All other systems reviewed and are negative.    Physical Exam Updated Vital Signs BP 110/87   Pulse 69   Temp 98.2 F (36.8 C) (Oral)   Resp 19   Ht 5\' 6"  (1.676 m)   Wt 202 lb (91.6 kg)   SpO2 96%   BMI 32.60 kg/m   Physical Exam Physical Exam  Nursing note and vitals reviewed. Constitutional: She is oriented to person, place, and time. She appears well-developed and well-nourished. No distress.  HENT:  Head: Normocephalic and atraumatic.  Eyes: Pupils are equal, round, and reactive to light.  Neck: Normal range of motion.  Cardiovascular: Normal rate and intact distal pulses.   Pulmonary/Chest: No respiratory distress.  Abdominal: Normal appearance. She exhibits no distension.  Musculoskeletal: Normal range of motion.  Neurological: She is alert and oriented to person, place, and time. No cranial nerve deficit.  Skin: Skin is warm and dry. No rash noted.  Psychiatric: She has a normal mood and affect. Her behavior is normal.    ED Treatments / Results  Labs (all labs ordered are listed, but only abnormal results are displayed) Labs Reviewed  COMPREHENSIVE METABOLIC PANEL - Abnormal; Notable for the following:       Result Value   Sodium 134 (*)    CO2 19 (*)    Glucose, Bld 199 (*)    BUN 27 (*)     Total Protein 5.7 (*)    ALT 12 (*)    GFR calc non Af Amer 56 (*)    All other components within normal limits  CBC WITH DIFFERENTIAL/PLATELET - Abnormal; Notable for the following:    WBC 12.1 (*)    Neutro Abs 8.4 (*)    All other components within normal limits  LIPASE, BLOOD  I-STAT TROPOININ, ED    EKG  EKG Interpretation  Date/Time:  Saturday September 01 2016 10:28:10 EDT Ventricular Rate:  69 PR Interval:  338 QRS Duration: 142 QT Interval:  402 QTC Calculation: 430 R Axis:   117 Text Interpretation:  Sinus rhythm with 1st degree A-V block Right bundle branch block T wave abnormality, consider inferior ischemia Abnormal ECG No significant change since last tracing Confirmed by Dequann Vandervelden  MD, Meeghan Skipper ([id number]) on 09/01/2016 10:39:06 AM       Radiology Dg Chest 2 View  Result Date: 09/01/2016 CLINICAL DATA:  Intermittent chest pain EXAM: CHEST  2 VIEW COMPARISON:  July 14, 2016 FINDINGS: The heart size and mediastinal contours are within normal limits. Both lungs are clear. The visualized skeletal structures are unremarkable. IMPRESSION: No active cardiopulmonary disease. Electronically Signed   By: Gerome Sam III M.D   On: 09/01/2016 11:53   Ct Abdomen Pelvis W Contrast  Result Date: 09/01/2016 CLINICAL DATA:  Chest pain.  Dizziness.  Nausea. EXAM: CT ABDOMEN AND PELVIS WITH CONTRAST TECHNIQUE: Multidetector CT imaging of the abdomen and pelvis was performed using the standard protocol following bolus administration of intravenous contrast. CONTRAST:  ISOVUE-300 IOPAMIDOL (ISOVUE-300) INJECTION 61% COMPARISON:  None. FINDINGS: Lower chest: Mild basilar atelectasis.  Heart is mildly enlarged. Hepatobiliary: Unremarkable liver. Gallbladder surgically absent. Mild intrahepatic bile duct dilation, presumed chronic. Common bile duct is normal caliber. Pancreas: Partial fatty replacement. No mass or inflammation. No duct dilation. Spleen: Normal in size without focal  abnormality. Adrenals/Urinary Tract: No adrenal masses. 15 mm right midpole renal cyst. No other renal masses, no stones and no  hydronephrosis. Normal ureters. Bladder is unremarkable. Stomach/Bowel: Stomach and small bowel unremarkable. There are scattered colonic diverticula. No diverticulitis. Colon is otherwise unremarkable. Vascular/Lymphatic: There are scattered prominent gastrohepatic ligament and retroperitoneal lymph nodes. Largest is a gastrohepatic node measuring 11 mm in short axis. Mild atherosclerotic that there are atherosclerotic calcifications along a normal caliber abdominal aorta and iliac vessels appear Reproductive: Status post hysterectomy. No adnexal masses. Other: No abdominal wall hernia or abnormality. No abdominopelvic ascites. Musculoskeletal: Chronic fracture of L4 stable from a lumbar MRI dated 04/27/2015. There are degenerative changes of visualized spine and arthropathic changes of the left hip. No osteoblastic or osteolytic lesions. IMPRESSION: 1. No acute findings within the abdomen or pelvis. 2. Status post cholecystectomy and hysterectomy. 3. Colonic diverticula without diverticulitis. 4. Simple appearing right midpole renal cyst. 5. Scattered prominent lymph nodes all presumed reactive. 6. Aortic atherosclerosis. Electronically Signed   By: Amie Portland M.D.   On: 09/01/2016 14:30    Procedures Procedures (including critical care time)  Medications Ordered in ED Medications  gi cocktail (Maalox,Lidocaine,Donnatal) (30 mLs Oral Given 09/01/16 1156)  sodium chloride 0.9 % bolus 500 mL (0 mLs Intravenous Stopped 09/01/16 1338)  iopamidol (ISOVUE-300) 61 % injection 100 mL (100 mLs Intravenous Contrast Given 09/01/16 1350)     Initial Impression / Assessment and Plan / ED Course  I have reviewed the triage vital signs and the nursing notes.  Pertinent labs & imaging results that were available during my care of the patient were reviewed by me and considered in my  medical decision making (see chart for details).  Clinical Course      Final Clinical Impressions(s) / ED Diagnoses   Final diagnoses:  Nonspecific chest pain    New Prescriptions New Prescriptions   No medications on file     Nelva Nay, MD 09/01/16 1530

## 2016-09-01 NOTE — ED Notes (Signed)
Pt noted to have significantly lower HR on the monitor. EKG captured, provided to EDP. Pt alert and oriented, denies chest pain. Pt reports hx of a-fib.

## 2016-09-01 NOTE — H&P (Signed)
History and Physical    LINDYN VOSSLER WUJ:[id number] DOB: 1934/10/09 DOA: 09/01/2016  PCP: Emeterio Reeve, MD Patient coming from: home  Chief Complaint: chest pain  HPI: Sierra Wright is a very pleasant 82 y.o. female with medical history significant for recent CVA, diabetes, hypertension, second-degree AV block, CAD status post coronary bypass graft 20 years ago since emergency department with a chief complaint of chest pain. Recent being admitted for chest pain rule out.  Information is obtained from the patient. She reports intermittent epigastric chest pain for the last several days. Describes the pain as an ache and sharpness nonradiating. She reports each episode lasts longer than 5 minutes. No relationship between eating or activity to these episodes. Associated symptoms include nausea without vomiting worsening shortness of breath. She denies palpitations headache dizziness syncope or near-syncope. She denies lower extremity edema or orthopnea. He denies abdominal pain dysuria hematuria frequency or urgency. She denies diarrhea constipation melena bright red blood per rectum. She denies fever chills recent travel or sick contacts   ED Course: In the emergency department she is afebrile hemodynamically stable and not hypoxic. She is provided with GI cocktail and is pain-free at the time of admission  Review of Systems: As per HPI otherwise 10 point review of systems negative.   Ambulatory Status: Mostly wheelchair-bound since her stroke 6 weeks ago  Past Medical History:  Diagnosis Date  . Arthritis   . Coronary artery disease 1999   CABG  . Depression   . Diabetes mellitus without complication (HCC)    type II; metformin and Amaryl  . Diabetic neuropathy (HCC)   . History of anemia as a child   . HOH (hard of hearing)   . Hypertension   . Hypothyroidism    synthroid  . PONV (postoperative nausea and vomiting)   . Restless leg syndrome   . Shortness of breath  09/03/12   ECHO- The LA is Moderately Dilated, mild mitral annular calcification. mild pulmonary hypertension, moderate concentric LV hypertrophy. Atrial septum is aneurysmal. Aortic valve appears to be mildly sclerotic.EF 88%  . Urinary incontinence   . Weakness of both legs     Past Surgical History:  Procedure Laterality Date  . ABDOMINAL HYSTERECTOMY    . APPENDECTOMY    . BACK SURGERY    . CARDIAC CATHERIZATION  08/20/03   Widely patent bypass grafts. Normal left ventricular systolic function.  Marland Kitchen CARDIAC CATHETERIZATION    . CHOLECYSTECTOMY    . COLONOSCOPY    . CORONARY ARTERY BYPASS GRAFT    . EYE SURGERY     bilateral cataracts  . LUMBAR LAMINECTOMY/DECOMPRESSION MICRODISCECTOMY N/A 07/26/2015   Procedure: BILATERAL Lumbar 3-4 SUBTOTAL HEMILAMINECTOMY WITH LATERAL RECESS DECOMPRESSION;  Surgeon: Kerrin Champagne, MD;  Location: [facility] OR;  Service: Orthopedics;  Laterality: N/A;  . SHOULDER SURGERY    . TUBAL LIGATION      Social History   Social History  . Marital status: Married    Spouse name: N/A  . Number of children: N/A  . Years of education: N/A   Occupational History  . Not on file.   Social History Main Topics  . Smoking status: Never Smoker  . Smokeless tobacco: Never Used  . Alcohol use No  . Drug use: No  . Sexual activity: Not on file   Other Topics Concern  . Not on file   Social History Narrative   Lives with husband in a 1 story home.  Has 1 daughter.  Education: high school.     Worked as a Futures trader.     Allergies  Allergen Reactions  . Statins Other (See Comments)    Causes myalgias  . Amitriptyline Other (See Comments)    Pt. States that it caused it to be suicidal.   . Tylenol [Acetaminophen] Nausea Only  . Welchol [Colesevelam Hcl] Other (See Comments)    Causes dizziness    Family History  Problem Relation Age of Onset  . Other Mother   . Pneumonia Mother   . Heart attack Father   . Liver cancer Sister   . Stroke Sister   .  Heart attack Brother 56  . Stroke Son 50  . Healthy Daughter     Prior to Admission medications   Medication Sig Start Date End Date Taking? Authorizing Provider  Ascorbic Acid (VITAMIN C ER PO) Take 1,000 mg by mouth daily.    Yes Historical Provider, MD  benazepril (LOTENSIN) 40 MG tablet Take 1 tablet (40 mg total) by mouth daily. 07/09/16  Yes Vesta Mixer, MD  Calcium Carb-Cholecalciferol (CALCIUM 600/VITAMIN D3) 600-800 MG-UNIT TABS Take 1 tablet by mouth 2 (two) times daily.   Yes Historical Provider, MD  carbidopa-levodopa (SINEMET IR) 25-100 MG tablet Take half tablet at bedtime for 3 days, then increase to 1 tablet at bedtime. Patient taking differently: Take 1 tablet by mouth at bedtime. Take half tablet at bedtime for 3 days, then increase to 1 tablet at bedtime. 07/26/16  Yes Donika Concha Se, DO  clopidogrel (PLAVIX) 75 MG tablet Take 1 tablet (75 mg total) by mouth daily. 07/15/16  Yes Rodolph Bong, MD  diazepam (VALIUM) 10 MG tablet Take 10 mg by mouth every 6 (six) hours as needed for anxiety.   Yes Historical Provider, MD  fish oil-omega-3 fatty acids 1000 MG capsule Take 1-2 g by mouth See admin instructions. 1 capsule in the morning then 2 capsules at lunch then 1 capsule in the evening   Yes Historical Provider, MD  furosemide (LASIX) 40 MG tablet TAKE ONE TABLET BY MOUTH TWICE DAILY. Patient taking differently: Take 40 mg by mouth once a day 12/13/15  Yes Cassell Clement, MD  gabapentin (NEURONTIN) 100 MG capsule Take 1 capsule (100 mg total) by mouth at bedtime. After one week, patient may increase to 200 mg qhs Patient taking differently: Take 200 mg by mouth at bedtime.  08/01/16  Yes Donika K Patel, DO  Garlic 1000 MG CAPS Take 1,000 mg by mouth daily.    Yes Historical Provider, MD  glimepiride (AMARYL) 4 MG tablet Take 4 mg by mouth daily with breakfast.    Yes Historical Provider, MD  hydrALAZINE (APRESOLINE) 25 MG tablet TAKE ONE TABLET BY MOUTH THREE TIMES DAILY  10/13/15  Yes Cassell Clement, MD  levothyroxine (SYNTHROID, LEVOTHROID) 112 MCG tablet Take 112 mcg by mouth daily before breakfast.   Yes Historical Provider, MD  metFORMIN (GLUCOPHAGE) 500 MG tablet Take 500 mg by mouth 3 (three) times daily.    Yes Historical Provider, MD  metoprolol tartrate (LOPRESSOR) 25 MG tablet Take 0.5 tablets (12.5 mg total) by mouth 2 (two) times daily. 07/15/16  Yes Rodolph Bong, MD  potassium chloride (K-DUR,KLOR-CON) 10 MEQ tablet Take 10 mEq by mouth 4 (four) times daily. 03/15/14  Yes Cassell Clement, MD  tizanidine (ZANAFLEX) 2 MG capsule Take 1 capsule (2 mg total) by mouth at bedtime. 07/26/16  Yes Donika Concha Se, DO  vitamin B-12 (CYANOCOBALAMIN) 1000 MCG  tablet Take 1,000 mcg by mouth daily.   Yes Historical Provider, MD  amLODipine (NORVASC) 5 MG tablet Take 1 tablet (5 mg total) by mouth daily. 05/07/16   Vesta MixerPhilip J [name], MD  ibuprofen (ADVIL,MOTRIN) 400 MG tablet Take 1 tablet (400 mg total) by mouth every 6 (six) hours as needed for fever, mild pain, moderate pain or cramping. Patient not taking: Reported on 09/01/2016 07/15/16   Rodolph Bonganiel V Thompson, MD    Physical Exam: Vitals:   09/01/16 1515 09/01/16 1545 09/01/16 1600 09/01/16 1615  BP: 110/87 164/72 160/58 150/69  Pulse: 69 64 66 60  Resp: 19 15 13 15   Temp:      TempSrc:      SpO2: 96% 98% 97% 97%  Weight:      Height:         General:  Appears calm and comfortable Eyes:  PERRL, EOMI, normal lids, iris ENT:  grossly normal hearing, lips & tongue, his membranes of her mouth are pink slightly dry Neck:  no LAD, masses or thyromegaly Cardiovascular:  RRR, no m/r/g. No LE edema.  Respiratory:  CTA bilaterally, no w/r/r. Normal respiratory effort. Abdomen:  soft, ntnd, obese positive bowel sounds no guarding or rebounding Skin:  no rash or induration seen on limited exam Musculoskeletal:  grossly normal tone BUE/BLE, good ROM, no bony abnormality Psychiatric:  grossly normal mood and  affect, speech fluent and appropriate, AOx3 Neurologic:  CN 2-12 grossly intact, moves all extremities in coordinated fashion, sensation intact  Labs on Admission: I have personally reviewed following labs and imaging studies  CBC:  Recent Labs Lab 09/01/16 1053  WBC 12.1*  NEUTROABS 8.4*  HGB 14.0  HCT 42.6  MCV 93.2  PLT 180   Basic Metabolic Panel:  Recent Labs Lab 09/01/16 1053  NA 134*  K 4.1  CL 104  CO2 19*  GLUCOSE 199*  BUN 27*  CREATININE 0.94  CALCIUM 9.0   GFR: Estimated Creatinine Clearance: 54.4 mL/min (by C-G formula based on SCr of 0.94 mg/dL). Liver Function Tests:  Recent Labs Lab 09/01/16 1053  AST 19  ALT 12*  ALKPHOS 64  BILITOT 0.7  PROT 5.7*  ALBUMIN 3.6    Recent Labs Lab 09/01/16 1053  LIPASE 20   No results for input(s): AMMONIA in the last 168 hours. Coagulation Profile: No results for input(s): INR, PROTIME in the last 168 hours. Cardiac Enzymes: No results for input(s): CKTOTAL, CKMB, CKMBINDEX, TROPONINI in the last 168 hours. BNP (last 3 results) No results for input(s): PROBNP in the last 8760 hours. HbA1C: No results for input(s): HGBA1C in the last 72 hours. CBG: No results for input(s): GLUCAP in the last 168 hours. Lipid Profile: No results for input(s): CHOL, HDL, LDLCALC, TRIG, CHOLHDL, LDLDIRECT in the last 72 hours. Thyroid Function Tests: No results for input(s): TSH, T4TOTAL, FREET4, T3FREE, THYROIDAB in the last 72 hours. Anemia Panel: No results for input(s): VITAMINB12, FOLATE, FERRITIN, TIBC, IRON, RETICCTPCT in the last 72 hours. Urine analysis:    Component Value Date/Time   COLORURINE YELLOW 07/14/2016 0008   APPEARANCEUR CLEAR 07/14/2016 0008   LABSPEC 1.008 07/14/2016 0008   PHURINE 7.0 07/14/2016 0008   GLUCOSEU NEGATIVE 07/14/2016 0008   HGBUR NEGATIVE 07/14/2016 0008   BILIRUBINUR NEGATIVE 07/14/2016 0008   KETONESUR NEGATIVE 07/14/2016 0008   PROTEINUR NEGATIVE 07/14/2016 0008    NITRITE NEGATIVE 07/14/2016 0008   LEUKOCYTESUR MODERATE (A) 07/14/2016 0008    Creatinine Clearance: Estimated Creatinine Clearance: 54.4  mL/min (by C-G formula based on SCr of 0.94 mg/dL).  Sepsis Labs: @LABRCNTIP (procalcitonin:4,lacticidven:4) )No results found for this or any previous visit (from the past 240 hour(s)).   Radiological Exams on Admission: Dg Chest 2 View  Result Date: 09/01/2016 CLINICAL DATA:  Intermittent chest pain EXAM: CHEST  2 VIEW COMPARISON:  July 14, 2016 FINDINGS: The heart size and mediastinal contours are within normal limits. Both lungs are clear. The visualized skeletal structures are unremarkable. IMPRESSION: No active cardiopulmonary disease. Electronically Signed   By: Gerome Sam III M.D   On: 09/01/2016 11:53   Ct Abdomen Pelvis W Contrast  Result Date: 09/01/2016 CLINICAL DATA:  Chest pain.  Dizziness.  Nausea. EXAM: CT ABDOMEN AND PELVIS WITH CONTRAST TECHNIQUE: Multidetector CT imaging of the abdomen and pelvis was performed using the standard protocol following bolus administration of intravenous contrast. CONTRAST:  ISOVUE-300 IOPAMIDOL (ISOVUE-300) INJECTION 61% COMPARISON:  None. FINDINGS: Lower chest: Mild basilar atelectasis.  Heart is mildly enlarged. Hepatobiliary: Unremarkable liver. Gallbladder surgically absent. Mild intrahepatic bile duct dilation, presumed chronic. Common bile duct is normal caliber. Pancreas: Partial fatty replacement. No mass or inflammation. No duct dilation. Spleen: Normal in size without focal abnormality. Adrenals/Urinary Tract: No adrenal masses. 15 mm right midpole renal cyst. No other renal masses, no stones and no hydronephrosis. Normal ureters. Bladder is unremarkable. Stomach/Bowel: Stomach and small bowel unremarkable. There are scattered colonic diverticula. No diverticulitis. Colon is otherwise unremarkable. Vascular/Lymphatic: There are scattered prominent gastrohepatic ligament and retroperitoneal  lymph nodes. Largest is a gastrohepatic node measuring 11 mm in short axis. Mild atherosclerotic that there are atherosclerotic calcifications along a normal caliber abdominal aorta and iliac vessels appear Reproductive: Status post hysterectomy. No adnexal masses. Other: No abdominal wall hernia or abnormality. No abdominopelvic ascites. Musculoskeletal: Chronic fracture of L4 stable from a lumbar MRI dated 04/27/2015. There are degenerative changes of visualized spine and arthropathic changes of the left hip. No osteoblastic or osteolytic lesions. IMPRESSION: 1. No acute findings within the abdomen or pelvis. 2. Status post cholecystectomy and hysterectomy. 3. Colonic diverticula without diverticulitis. 4. Simple appearing right midpole renal cyst. 5. Scattered prominent lymph nodes all presumed reactive. 6. Aortic atherosclerosis. Electronically Signed   By: Amie Portland M.D.   On: 09/01/2016 14:30    EKG: Independently reviewed. Sinus rhythm Second deg AVB, Mobitz I (Wenckebach) IVCD, consider atypical RBBB Abnormal T  Assessment/Plan Principal Problem:   Chest pain Active Problems:   DM type 2 goal A1C below 7.5   Chronic diastolic CHF (congestive heart failure) (HCC)   CVA (cerebral infarction): Early subacute   Essential hypertension   2Nd degree AV block   Coronary artery disease involving coronary bypass graft of native heart without angina pectoris   1. Chest pain. Some typical and atypical features. Heart score 6. History CAD, diabetes, hypertension, stroke. EKG without acute changes. Chest x-ray with no active cardiopulmonary disease. CT abdomen pelvis with no acute findings from colonic diverticula without diverticulitis. Recent lipid panel HDL 39 otherwise unremarkable. Medications include Plavix. She is pain-free at the time of admission. Of note she reports she has cardiology appointment scheduled for November.  -Admit to telemetry -Cycle troponin -Serial EKG -GI  cocktail -continue plavix -Supportive therapy -cardiology consult per ED MD  2. Hypertension. Fair control in the emergency department. Home medications include benazepril, hydralazine metoprolol, Lasix and Norvasc. -Continue home meds -Monitor  #3. Chronic diastolic heart failure. Compensated. No meds include beta blocker and Lasix. Recent echo reveals an EF  of 60%. -Continue home meds -Daily weights -Intake and output  #4. Diabetes. Recent hemoglobin A1c 7.5. Serum glucose 199 on admission. Home medications include Amaryl, Glucophage -Hold oral agents for now -Use sliding scale insulin for optimal control  5. CVA. Chart review indicates 6 weeks ago patient with 9 mm early subacute ischemic nonhemorrhagic infarct involving right thalamus per MRI. Chart review indicates patient with an allergy to statins. Outpatient PTOT AST recommended. She was on aspirin prior to this some that was changed to Plavix per neurology recommendation. Reports she has followed up with neurology -PT and OT (patient reports mostly wheelchair bound since stroke per family)    DVT prophylaxis: scd  Code Status:  dnr Family Communication: husband and daughter at bedside  Disposition Plan: home  Consults called: cardiology contacted by ED MD  Admission status: obs    Gwenyth Bender MD [facility]  If 7PM-7AM, please contact night-coverage [email or web address] Password [facility]  09/01/2016, 4:35 PM

## 2016-09-01 NOTE — ED Triage Notes (Signed)
Pt reports midsternal non radiating chest pain and SOB. Onset this morning at 8:00am. Respirations labored upon arrival to ED.

## 2016-09-02 ENCOUNTER — Other Ambulatory Visit: Payer: Self-pay | Admitting: Physician Assistant

## 2016-09-02 DIAGNOSIS — R072 Precordial pain: Secondary | ICD-10-CM | POA: Diagnosis not present

## 2016-09-02 DIAGNOSIS — E119 Type 2 diabetes mellitus without complications: Secondary | ICD-10-CM

## 2016-09-02 DIAGNOSIS — I2510 Atherosclerotic heart disease of native coronary artery without angina pectoris: Secondary | ICD-10-CM | POA: Diagnosis not present

## 2016-09-02 DIAGNOSIS — I25810 Atherosclerosis of coronary artery bypass graft(s) without angina pectoris: Secondary | ICD-10-CM | POA: Diagnosis not present

## 2016-09-02 DIAGNOSIS — I10 Essential (primary) hypertension: Secondary | ICD-10-CM | POA: Diagnosis not present

## 2016-09-02 DIAGNOSIS — R079 Chest pain, unspecified: Secondary | ICD-10-CM

## 2016-09-02 DIAGNOSIS — E039 Hypothyroidism, unspecified: Secondary | ICD-10-CM | POA: Diagnosis not present

## 2016-09-02 DIAGNOSIS — E1140 Type 2 diabetes mellitus with diabetic neuropathy, unspecified: Secondary | ICD-10-CM | POA: Diagnosis not present

## 2016-09-02 LAB — BASIC METABOLIC PANEL
ANION GAP: 10 (ref 5–15)
BUN: 23 mg/dL — ABNORMAL HIGH (ref 6–20)
CALCIUM: 9.2 mg/dL (ref 8.9–10.3)
CO2: 25 mmol/L (ref 22–32)
Chloride: 105 mmol/L (ref 101–111)
Creatinine, Ser: 0.95 mg/dL (ref 0.44–1.00)
GFR, EST NON AFRICAN AMERICAN: 55 mL/min — AB (ref >60)
Glucose, Bld: 125 mg/dL — ABNORMAL HIGH (ref 65–99)
POTASSIUM: 4 mmol/L (ref 3.5–5.1)
Sodium: 140 mmol/L (ref 135–145)

## 2016-09-02 LAB — GLUCOSE, CAPILLARY
GLUCOSE-CAPILLARY: 148 mg/dL — AB (ref 65–99)
GLUCOSE-CAPILLARY: 225 mg/dL — AB (ref 65–99)

## 2016-09-02 MED ORDER — NITROGLYCERIN 0.4 MG SL SUBL: 0.4000 mg | SUBLINGUAL_TABLET | SUBLINGUAL | 0 refills | Status: DC | PRN

## 2016-09-02 NOTE — Progress Notes (Signed)
Discharge instructions reviewed with patient and family. Pt has no questions at this time. Prescription given to patient. Patient discharged home with husband.

## 2016-09-02 NOTE — Plan of Care (Signed)
Problem: Safety: Goal: Ability to remain free from injury will improve Outcome: Completed/Met Date Met: 09/02/16 Pt free from injury this hospital stay  Problem: Physical Regulation: Goal: Ability to maintain clinical measurements within normal limits will improve Outcome: Progressing BP improved with pharm therapy Goal: Will remain free from infection Outcome: Completed/Met Date Met: 09/02/16 Free from infection this hospital stay  Problem: Skin Integrity: Goal: Risk for impaired skin integrity will decrease Outcome: Completed/Met Date Met: 09/02/16 Pt changed promptly after episodes of incontinence  Problem: Tissue Perfusion: Goal: Risk factors for ineffective tissue perfusion will decrease Outcome: Completed/Met Date Met: 09/02/16 Peripheral pulses +2 all extremeties  Problem: Activity: Goal: Risk for activity intolerance will decrease Outcome: Completed/Met Date Met: 09/02/16 Pt maintained similar level of activity as home this hospital stay  Problem: Fluid Volume: Goal: Ability to maintain a balanced intake and output will improve Outcome: Completed/Met Date Met: 09/02/16 Pt eating well since NPO order d/ced  Problem: Bowel/Gastric: Goal: Will not experience complications related to bowel motility Outcome: Completed/Met Date Met: 09/02/16 Pt reports no complaints of constipation. Last BM yesterday

## 2016-09-02 NOTE — Discharge Instructions (Signed)

## 2016-09-02 NOTE — Plan of Care (Signed)
Problem: Safety: Goal: Ability to remain free from injury will improve Outcome: Progressing RN instructed patient to call and wait for staff assistance prior to getting out of bed.  Patient stated understanding and has been complaint with request thus far this shift.

## 2016-09-02 NOTE — Plan of Care (Signed)
Problem: Education: Goal: Knowledge of [facility] Education information/materials will improve Outcome: Progressing Patient aware of plan of care.  RN provided medication education to patient on all medications administered thus far this shift.  Patient stated understanding.     

## 2016-09-02 NOTE — Progress Notes (Signed)
Physician Discharge Summary  Sierra Wright ZOX:[id number] DOB: [birth year] DOA: 09/01/2016  PCP: Emeterio Reeve, MD  Admit date: 09/01/2016 Discharge date: 09/02/2016  Admitted From: Home Disposition:  Home  Recommendations for Outpatient Follow-up:  1. Follow up with PCP in 1-2 weeks 2. Patient has been placed on nitroglycerin as needed for chest pain. 3. Patient will follow-up with cardiology for a Lexiscan Myoview as an outpatient.  Home Health: No Equipment/Devices: No  Discharge Condition: Stable CODE STATUS: DNR  Diet recommendation: Heart Healthy / Carb Modified   Brief/Interim Summary: This is a 82 year old female who presented to the hospital with the chief complaint of chest pain. She had a coronary artery bypass grafting 20 years ago. Pain was localized in the lower mid chest/epigastric region, dull and sharp in nature, nonradiating, lasted longer than 5 minutes, it was not related to by mouth intake or activity but with nausea as well as shortness of breath. On initial physical examination her blood pressure was 164/72, heart rate 64, respiratory rate 15, oxygen saturation 90%, her oral mucosa was moist, her lungs were clear to auscultation bilaterally, heart S1-S2 present and rhythmic, abdomen was soft and nontender, lower extremity is no edema. Her sodium was 134, potassium 4.1, creatinine 0.94, BUN 27, lipase 20, cardiac enzymes were negative. CT of the abdomen and pelvis was negative for acute pathology. Her chest film was negative for infiltrates, noted increased lung markings. Her EKG showed sinus rhythm with a first-degree AV block, right bundle branch block. (Old changes).   Patient was admitted to the hospital with the working diagnosis of atypical chest pain to rule out acute coronary syndrome.  1. Chest pain. Patient remained chest pain-free in the hospital, serial EKGs and serial cardiac enzymes were negative for acute coronary syndrome. Patient was continued on  antiplatelet therapy with clopidogrel, as well as omega-3 fatty acids. Her lipid profile from August 2017 showed an LDL of 91 with HDL of 39. Cardiology was consulted, patient will have a Lexiscan Myoview as an outpatient. Patient has been placed on nitroglycerin sublingual as needed for chest pain.  2. Hypertension. Blood pressure remained controlled in the hospital with systolic between 811 and 157. She was continued on amlodipine, benazepril, furosemide, metoprolol, hydralazine.  3. Type 2 diabetes mellitus. Patient was placed on insulin sliding scale for glucose coverage and monitoring. Serum glucose remained between 123 and 228. At the time of discharge she will resume taking metformin and glimeperide.   4. Hypothyroidism. Patient will continue taking levothyroxine.  5. Anxiety. Patient will continue taking diazepam and tizanidine per her home regimen.   Discharge Diagnoses:  Principal Problem:   Chest pain Active Problems:   DM type 2 goal A1C below 7.5   Chronic diastolic CHF (congestive heart failure) (HCC)   CVA (cerebral infarction): Early subacute   Essential hypertension   2Nd degree AV block   Coronary artery disease involving coronary bypass graft of native heart without angina pectoris    Discharge Instructions  Discharge Instructions    Diet - low sodium heart healthy    Complete by:  As directed    Discharge instructions    Complete by:  As directed    Please follow with primary care in 7 days. Please follow with cardiology as scheduled.   Increase activity slowly    Complete by:  As directed        Medication List    STOP taking these medications   ibuprofen 400 MG tablet Commonly known as:  ADVIL,MOTRIN     TAKE these medications   amLODipine 5 MG tablet Commonly known as:  NORVASC Take 1 tablet (5 mg total) by mouth daily.   benazepril 40 MG tablet Commonly known as:  LOTENSIN Take 1 tablet (40 mg total) by mouth daily.   CALCIUM 600/VITAMIN D3  600-800 MG-UNIT Tabs Generic drug:  Calcium Carb-Cholecalciferol Take 1 tablet by mouth 2 (two) times daily.   carbidopa-levodopa 25-100 MG tablet Commonly known as:  SINEMET IR Take half tablet at bedtime for 3 days, then increase to 1 tablet at bedtime. What changed:  how much to take  how to take this  when to take this  additional instructions   clopidogrel 75 MG tablet Commonly known as:  PLAVIX Take 1 tablet (75 mg total) by mouth daily.   diazepam 10 MG tablet Commonly known as:  VALIUM Take 10 mg by mouth every 6 (six) hours as needed for anxiety.   fish oil-omega-3 fatty acids 1000 MG capsule Take 1-2 g by mouth See admin instructions. 1 capsule in the morning then 2 capsules at lunch then 1 capsule in the evening   furosemide 40 MG tablet Commonly known as:  LASIX TAKE ONE TABLET BY MOUTH TWICE DAILY. What changed:  See the new instructions.   gabapentin 100 MG capsule Commonly known as:  NEURONTIN Take 1 capsule (100 mg total) by mouth at bedtime. After one week, patient may increase to 200 mg qhs What changed:  how much to take  additional instructions   Garlic 1000 MG Caps Take 1,000 mg by mouth daily.   glimepiride 4 MG tablet Commonly known as:  AMARYL Take 4 mg by mouth daily with breakfast.   hydrALAZINE 25 MG tablet Commonly known as:  APRESOLINE TAKE ONE TABLET BY MOUTH THREE TIMES DAILY What changed:  See the new instructions.   levothyroxine 112 MCG tablet Commonly known as:  SYNTHROID, LEVOTHROID Take 112 mcg by mouth daily before breakfast.   metFORMIN 500 MG tablet Commonly known as:  GLUCOPHAGE Take 500 mg by mouth 3 (three) times daily.   metoprolol tartrate 25 MG tablet Commonly known as:  LOPRESSOR Take 0.5 tablets (12.5 mg total) by mouth 2 (two) times daily.   nitroGLYCERIN 0.4 MG SL tablet Commonly known as:  NITROSTAT Place 1 tablet (0.4 mg total) under the tongue every 5 (five) minutes as needed for chest pain. If  need to take more than 3 times (every 5 minutes), please call you doctor.   potassium chloride 10 MEQ tablet Commonly known as:  K-DUR,KLOR-CON Take 10 mEq by mouth 4 (four) times daily.   tizanidine 2 MG capsule Commonly known as:  ZANAFLEX Take 1 capsule (2 mg total) by mouth at bedtime.   vitamin B-12 1000 MCG tablet Commonly known as:  CYANOCOBALAMIN Take 1,000 mcg by mouth daily.   VITAMIN C ER PO Take 1,000 mg by mouth daily.      Follow-up Information    Kristeen Miss, MD .   Specialty:  Cardiology Why:  The office will contact you to make an appointment for follow up and a stress test Contact information: [address] [place] [address] [phone]          Allergies  Allergen Reactions  . Statins Other (See Comments)    Causes myalgias  . Amitriptyline Other (See Comments)    Pt. States that it caused it to be suicidal.   . Benadryl [Diphenhydramine Hcl (Sleep)] Other (See  Comments)    Makes the patient very "hyper"  . Tylenol [Acetaminophen] Nausea Only  . Welchol [Colesevelam Hcl] Other (See Comments)    Causes dizziness    Consultations:  Cardiology   Procedures/Studies: Dg Chest 2 View  Result Date: 09/01/2016 CLINICAL DATA:  Intermittent chest pain EXAM: CHEST  2 VIEW COMPARISON:  July 14, 2016 FINDINGS: The heart size and mediastinal contours are within normal limits. Both lungs are clear. The visualized skeletal structures are unremarkable. IMPRESSION: No active cardiopulmonary disease. Electronically Signed   By: Gerome Samavid  Williams III M.D   On: 09/01/2016 11:53   Ct Abdomen Pelvis W Contrast  Result Date: 09/01/2016 CLINICAL DATA:  Chest pain.  Dizziness.  Nausea. EXAM: CT ABDOMEN AND PELVIS WITH CONTRAST TECHNIQUE: Multidetector CT imaging of the abdomen and pelvis was performed using the standard protocol following bolus administration of intravenous contrast. CONTRAST:  100mL ISOVUE-300 IOPAMIDOL (ISOVUE-300) INJECTION  61% COMPARISON:  None. FINDINGS: Lower chest: Mild basilar atelectasis.  Heart is mildly enlarged. Hepatobiliary: Unremarkable liver. Gallbladder surgically absent. Mild intrahepatic bile duct dilation, presumed chronic. Common bile duct is normal caliber. Pancreas: Partial fatty replacement. No mass or inflammation. No duct dilation. Spleen: Normal in size without focal abnormality. Adrenals/Urinary Tract: No adrenal masses. 15 mm right midpole renal cyst. No other renal masses, no stones and no hydronephrosis. Normal ureters. Bladder is unremarkable. Stomach/Bowel: Stomach and small bowel unremarkable. There are scattered colonic diverticula. No diverticulitis. Colon is otherwise unremarkable. Vascular/Lymphatic: There are scattered prominent gastrohepatic ligament and retroperitoneal lymph nodes. Largest is a gastrohepatic node measuring 11 mm in short axis. Mild atherosclerotic that there are atherosclerotic calcifications along a normal caliber abdominal aorta and iliac vessels appear Reproductive: Status post hysterectomy. No adnexal masses. Other: No abdominal wall hernia or abnormality. No abdominopelvic ascites. Musculoskeletal: Chronic fracture of L4 stable from a lumbar MRI dated 04/27/2015. There are degenerative changes of visualized spine and arthropathic changes of the left hip. No osteoblastic or osteolytic lesions. IMPRESSION: 1. No acute findings within the abdomen or pelvis. 2. Status post cholecystectomy and hysterectomy. 3. Colonic diverticula without diverticulitis. 4. Simple appearing right midpole renal cyst. 5. Scattered prominent lymph nodes all presumed reactive. 6. Aortic atherosclerosis. Electronically Signed   By: Amie Portlandavid  Ormond M.D.   On: 09/01/2016 14:30        Subjective: Patient is chest pain-free, no dyspnea, nausea or vomiting.  Discharge Exam: Vitals:   09/02/16 0755 09/02/16 0850  BP: (!) 124/55 (!) 157/63  Pulse: 69 63  Resp: 15   Temp: 98.7 F (37.1 C)     Vitals:   09/01/16 2349 09/02/16 0531 09/02/16 0755 09/02/16 0850  BP: (!) 121/47 (!) 130/54 (!) 124/55 (!) 157/63  Pulse:   69 63  Resp: 16 17 15    Temp: 99 F (37.2 C) 98.1 F (36.7 C) 98.7 F (37.1 C)   TempSrc: Oral Oral Oral   SpO2: 96% 97% 98%   Weight:  91.5 kg (201 lb 12.8 oz)    Height:        General: Pt is alert, awake, not in acute distress Cardiovascular: RRR, S1/S2 +, no rubs, no gallops Respiratory: CTA bilaterally, no wheezing, no rhonchi Abdominal: Soft, NT, ND, bowel sounds + Extremities: no edema, no cyanosis    The results of significant diagnostics from this hospitalization (including imaging, microbiology, ancillary and laboratory) are listed below for reference.     Microbiology: No results found for this or any previous visit (from the past 240 hour(s)).  Labs: BNP (last 3 results) No results for input(s): BNP in the last 8760 hours. Basic Metabolic Panel:  Recent Labs Lab 09/01/16 1053 09/02/16 0437  NA 134* 140  K 4.1 4.0  CL 104 105  CO2 19* 25  GLUCOSE 199* 125*  BUN 27* 23*  CREATININE 0.94 0.95  CALCIUM 9.0 9.2   Liver Function Tests:  Recent Labs Lab 09/01/16 1053  AST 19  ALT 12*  ALKPHOS 64  BILITOT 0.7  PROT 5.7*  ALBUMIN 3.6    Recent Labs Lab 09/01/16 1053  LIPASE 20   No results for input(s): AMMONIA in the last 168 hours. CBC:  Recent Labs Lab 09/01/16 1053 09/01/16 2219  WBC 12.1* 9.1  NEUTROABS 8.4*  --   HGB 14.0 12.7  HCT 42.6 39.2  MCV 93.2 94.0  PLT 180 159   Cardiac Enzymes:  Recent Labs Lab 09/01/16 1802 09/01/16 2219  TROPONINI <0.03 <0.03   BNP: Invalid input(s): POCBNP CBG:  Recent Labs Lab 09/01/16 1735 09/01/16 2117 09/02/16 0745  GLUCAP 123* 228* 148*   D-Dimer No results for input(s): DDIMER in the last 72 hours. Hgb A1c No results for input(s): HGBA1C in the last 72 hours. Lipid Profile No results for input(s): CHOL, HDL, LDLCALC, TRIG, CHOLHDL, LDLDIRECT  in the last 72 hours. Thyroid function studies No results for input(s): TSH, T4TOTAL, T3FREE, THYROIDAB in the last 72 hours.  Invalid input(s): FREET3 Anemia work up No results for input(s): VITAMINB12, FOLATE, FERRITIN, TIBC, IRON, RETICCTPCT in the last 72 hours. Urinalysis    Component Value Date/Time   COLORURINE YELLOW 07/14/2016 0008   APPEARANCEUR CLEAR 07/14/2016 0008   LABSPEC 1.008 07/14/2016 0008   PHURINE 7.0 07/14/2016 0008   GLUCOSEU NEGATIVE 07/14/2016 0008   HGBUR NEGATIVE 07/14/2016 0008   BILIRUBINUR NEGATIVE 07/14/2016 0008   KETONESUR NEGATIVE 07/14/2016 0008   PROTEINUR NEGATIVE 07/14/2016 0008   NITRITE NEGATIVE 07/14/2016 0008   LEUKOCYTESUR MODERATE (A) 07/14/2016 0008   Sepsis Labs Invalid input(s): PROCALCITONIN,  WBC,  LACTICIDVEN Microbiology No results found for this or any previous visit (from the past 240 hour(s)).   Time coordinating discharge: 45 minutes  SIGNED:   Coralie Keens, MD  [facility] 09/02/2016, 10:45 AM Pager   If 7PM-7AM, please contact night-coverage [email or web address] Password [email or web address]

## 2016-09-02 NOTE — Consult Note (Signed)
CARDIOLOGY CONSULT NOTE   Patient ID: Sierra Wright MRN: [id number] DOB/AGE: [birth year] 82 y.o.  Admit date: 09/01/2016  Requesting Physician: Primary Physician:   Emeterio Reeve, MD Primary Cardiologist:  Dr. Melburn Popper Reason for Consultation:  Chest pain  HPI: Sierra Wright is a 82 y.o. female with a history of CAD s/p CABG (1999), post op afib, HTN, DMT2, RBBB, chronic diastolic CHF, hypothyroidism, recent CVA (07/2016) and limited mobility 2/2 OA who presented to [facility] ED on 09/01/16 with chest pain.   She had bypass surgery in 1999 by Dr. [name]. She had a false positive nuclear study in 2004 followed by a heart cath which showed patent bypass grafts.   She previously followed with Dr. [name] but now see's Dr Melburn Popper since Dr. Yevonne Pax retirement. She was last seen in 04/2016 for follow up and was doing well at that time.   She was recently admitted 8/11-8/13/17 for acute CVA. She was found to have a 9 mm early subacute ischemic nonhemorrhagic infarct involving right thalamus on MRI. ASA was changed to plavix. Cardiology saw on this admission for occasional second degree AV block type I [name]. No evidence of high grade block and felt to be an overall fairly benign finding. Also felt to be potentially mediaction related (lopressor) or post CVA from increased parasympathetic tone. Her lopressor was decreased to 12.5mg  bid. EF at that time showed an EF of 60-65% with no wall motion abnormalities.   Since her most recent discharge she has mainly been in her wheelchair. Mobility severely limited by OA in knees and back. She was in her usual state of health until yesterday Am when she had severe chest pain x2 hours starting at 8am. By the time she got to the hospital, the pain self resolved. She did not try any nitro. It was associated with nausea but no vomiting and diaphoresis. She states that she "stays short of breath." No LE edema, orthopnea or PND. No  palpitations. She did have some dizziness along with the chest pain but no syncope. She thought she may be having another stroke and came to the hospital for evaluation. Since that time she has felt fine and thinks she maybe "jumped the gun."   Past Medical History:  Diagnosis Date  . Arthritis   . Coronary artery disease 1999   CABG  . Depression   . Diabetes mellitus without complication (HCC)    type II; metformin and Amaryl  . Diabetic neuropathy (HCC)   . History of anemia as a child   . HOH (hard of hearing)   . Hypertension   . Hypothyroidism    synthroid  . PONV (postoperative nausea and vomiting)   . Restless leg syndrome   . Shortness of breath 09/03/12   ECHO- The LA is Moderately Dilated, mild mitral annular calcification. mild pulmonary hypertension, moderate concentric LV hypertrophy. Atrial septum is aneurysmal. Aortic valve appears to be mildly sclerotic.EF 88%  . Urinary incontinence   . Weakness of both legs      Past Surgical History:  Procedure Laterality Date  . ABDOMINAL HYSTERECTOMY    . APPENDECTOMY    . BACK SURGERY    . CARDIAC CATHERIZATION  08/20/03   Widely patent bypass grafts. Normal left ventricular systolic function.  Marland Kitchen CARDIAC CATHETERIZATION    . CHOLECYSTECTOMY    . COLONOSCOPY    . CORONARY ARTERY BYPASS GRAFT    . EYE SURGERY  bilateral cataracts  . LUMBAR LAMINECTOMY/DECOMPRESSION MICRODISCECTOMY N/A 07/26/2015   Procedure: BILATERAL Lumbar 3-4 SUBTOTAL HEMILAMINECTOMY WITH LATERAL RECESS DECOMPRESSION;  Surgeon: Kerrin Champagne, MD;  Location: [facility] OR;  Service: Orthopedics;  Laterality: N/A;  . SHOULDER SURGERY    . TUBAL LIGATION      Allergies  Allergen Reactions  . Statins Other (See Comments)    Causes myalgias  . Amitriptyline Other (See Comments)    Pt. States that it caused it to be suicidal.   . Benadryl [Diphenhydramine Hcl (Sleep)] Other (See Comments)    Makes the patient very "hyper"  . Tylenol [Acetaminophen]  Nausea Only  . Welchol [Colesevelam Hcl] Other (See Comments)    Causes dizziness    I have reviewed the patient's current medications . amLODipine  5 mg Oral Daily  . benazepril  40 mg Oral Daily  . carbidopa-levodopa  1 tablet Oral QHS  . clopidogrel  75 mg Oral Daily  . enoxaparin (LOVENOX) injection  40 mg Subcutaneous Q24H  . furosemide  40 mg Oral Daily  . gabapentin  100 mg Oral QHS  . hydrALAZINE  25 mg Oral TID  . insulin aspart  0-15 Units Subcutaneous TID WC  . insulin aspart  0-5 Units Subcutaneous QHS  . levothyroxine  112 mcg Oral QAC breakfast  . metoprolol tartrate  12.5 mg Oral BID  . omega-3 acid ethyl esters  1 g Oral BID  . potassium chloride  10 mEq Oral QID  . tiZANidine  2 mg Oral QHS  . vitamin B-12  1,000 mcg Oral Daily     acetaminophen, diazepam, gi cocktail, ibuprofen, morphine injection, ondansetron (ZOFRAN) IV  Prior to Admission medications   Medication Sig Start Date End Date Taking? Authorizing Provider  Ascorbic Acid (VITAMIN C ER PO) Take 1,000 mg by mouth daily.    Yes Historical Provider, MD  benazepril (LOTENSIN) 40 MG tablet Take 1 tablet (40 mg total) by mouth daily. 07/09/16  Yes Vesta Mixer, MD  Calcium Carb-Cholecalciferol (CALCIUM 600/VITAMIN D3) 600-800 MG-UNIT TABS Take 1 tablet by mouth 2 (two) times daily.   Yes Historical Provider, MD  carbidopa-levodopa (SINEMET IR) 25-100 MG tablet Take half tablet at bedtime for 3 days, then increase to 1 tablet at bedtime. Patient taking differently: Take 1 tablet by mouth at bedtime. Take half tablet at bedtime for 3 days, then increase to 1 tablet at bedtime. 07/26/16  Yes Donika Concha Se, DO  clopidogrel (PLAVIX) 75 MG tablet Take 1 tablet (75 mg total) by mouth daily. 07/15/16  Yes Rodolph Bong, MD  diazepam (VALIUM) 10 MG tablet Take 10 mg by mouth every 6 (six) hours as needed for anxiety.   Yes Historical Provider, MD  fish oil-omega-3 fatty acids 1000 MG capsule Take 1-2 g by mouth  See admin instructions. 1 capsule in the morning then 2 capsules at lunch then 1 capsule in the evening   Yes Historical Provider, MD  furosemide (LASIX) 40 MG tablet TAKE ONE TABLET BY MOUTH TWICE DAILY. Patient taking differently: Take 40 mg by mouth once a day 12/13/15  Yes Cassell Clement, MD  gabapentin (NEURONTIN) 100 MG capsule Take 1 capsule (100 mg total) by mouth at bedtime. After one week, patient may increase to 200 mg qhs Patient taking differently: Take 200 mg by mouth at bedtime.  08/01/16  Yes Donika K Patel, DO  Garlic 1000 MG CAPS Take 1,000 mg by mouth daily.    Yes Historical Provider, MD  glimepiride (AMARYL) 4 MG tablet Take 4 mg by mouth daily with breakfast.    Yes Historical Provider, MD  hydrALAZINE (APRESOLINE) 25 MG tablet TAKE ONE TABLET BY MOUTH THREE TIMES DAILY Patient taking differently: Take 25 mg by mouth three times a day 10/13/15  Yes Cassell Clement, MD  levothyroxine (SYNTHROID, LEVOTHROID) 112 MCG tablet Take 112 mcg by mouth daily before breakfast.   Yes Historical Provider, MD  metFORMIN (GLUCOPHAGE) 500 MG tablet Take 500 mg by mouth 3 (three) times daily.    Yes Historical Provider, MD  metoprolol tartrate (LOPRESSOR) 25 MG tablet Take 0.5 tablets (12.5 mg total) by mouth 2 (two) times daily. 07/15/16  Yes Rodolph Bong, MD  potassium chloride (K-DUR,KLOR-CON) 10 MEQ tablet Take 10 mEq by mouth 4 (four) times daily. 03/15/14  Yes Cassell Clement, MD  tizanidine (ZANAFLEX) 2 MG capsule Take 1 capsule (2 mg total) by mouth at bedtime. 07/26/16  Yes Donika Concha Se, DO  vitamin B-12 (CYANOCOBALAMIN) 1000 MCG tablet Take 1,000 mcg by mouth daily.   Yes Historical Provider, MD  amLODipine (NORVASC) 5 MG tablet Take 1 tablet (5 mg total) by mouth daily. 05/07/16   Vesta Mixer, MD  ibuprofen (ADVIL,MOTRIN) 400 MG tablet Take 1 tablet (400 mg total) by mouth every 6 (six) hours as needed for fever, mild pain, moderate pain or cramping. Patient not taking:  Reported on 09/01/2016 07/15/16   Rodolph Bong, MD     Social History   Social History  . Marital status: Married    Spouse name: N/A  . Number of children: N/A  . Years of education: N/A   Occupational History  . Not on file.   Social History Main Topics  . Smoking status: Never Smoker  . Smokeless tobacco: Never Used  . Alcohol use No  . Drug use: No  . Sexual activity: Not on file   Other Topics Concern  . Not on file   Social History Narrative   Lives with husband in a 1 story home.  Has 1 daughter.   Education: high school.     Worked as a Futures trader.     Family Status  Relation Status  . Mother Deceased  . Father Deceased  . Maternal Grandmother Deceased  . Maternal Grandfather Deceased  . Paternal Grandmother Deceased  . Paternal Grandfather Deceased  . Sister   . Brother   . Son Deceased  . Daughter    Family History  Problem Relation Age of Onset  . Other Mother   . Pneumonia Mother   . Heart attack Father   . Liver cancer Sister   . Stroke Sister   . Heart attack Brother 56  . Stroke Son 65  . Healthy Daughter     ROS:  Full 14 point review of systems complete and found to be negative unless listed above.  Physical Exam: Blood pressure (!) 124/55, pulse 69, temperature 98.7 F (37.1 C), temperature source Oral, resp. rate 15, height 5\' 6"  (1.676 m), weight 201 lb 12.8 oz (91.5 kg), SpO2 98 %.  General: Well developed, well nourished, female in no acute distress obese Head: Eyes PERRLA, No xanthomas.   Normocephalic and atraumatic, oropharynx without edema or exudate.  Lungs: CTAB Heart: HRRR S1 S2, no rub/gallop, Heart regular rate and rhythm with S1, S2  murmur. pulses are 2+ extrem.   Neck: No carotid bruits. No lymphadenopathy. no  JVD. Abdomen: Bowel sounds present, abdomen soft and non-tender without masses  or hernias noted. Msk:  No spine or cva tenderness. No weakness, no joint deformities or effusions. Extremities: No clubbing or  cyanosis.  No LE edema.  Neuro: Alert and oriented X 3. No focal deficits noted. Psych:  Good affect, responds appropriately Skin: No rashes or lesions noted.  Labs:   Lab Results  Component Value Date   WBC 9.1 09/01/2016   HGB 12.7 09/01/2016   HCT 39.2 09/01/2016   MCV 94.0 09/01/2016   PLT 159 09/01/2016   No results for input(s): INR in the last 72 hours.  Recent Labs Lab 09/01/16 1053 09/02/16 0437  NA 134* 140  K 4.1 4.0  CL 104 105  CO2 19* 25  BUN 27* 23*  CREATININE 0.94 0.95  CALCIUM 9.0 9.2  PROT 5.7*  --   BILITOT 0.7  --   ALKPHOS 64  --   ALT 12*  --   AST 19  --   GLUCOSE 199* 125*  ALBUMIN 3.6  --    Magnesium  Date Value Ref Range Status  07/15/2016 2.1 1.7 - 2.4 mg/dL Final    Recent Labs  16/10/96 1802 09/01/16 2219  TROPONINI <0.03 <0.03    Recent Labs  09/01/16 1059  TROPIPOC 0.00   No results found for: PROBNP Lab Results  Component Value Date   CHOL 151 07/15/2016   HDL 39 (L) 07/15/2016   LDLCALC 91 07/15/2016   TRIG 106 07/15/2016   No results found for: DDIMER Lipase  Date/Time Value Ref Range Status  09/01/2016 10:53 AM 20 11 - 51 U/L Final   TSH  Date/Time Value Ref Range Status  07/13/2016 01:22 PM 1.037 0.350 - 4.500 uIU/mL Final   No results found for: VITAMINB12, FOLATE, FERRITIN, TIBC, IRON, RETICCTPCT  Echo:: 07/14/2016 LV EF: 60% -   65% Study Conclusions - Left ventricle: The cavity size was normal. Systolic function was   normal. The estimated ejection fraction was in the range of 60%   to 65%. Wall motion was normal; there were no regional wall   motion abnormalities. - Aortic valve: Transvalvular velocity was within the normal range.   There was no stenosis. There was no regurgitation. - Mitral valve: Transvalvular velocity was within the normal range.   There was no evidence for stenosis. There was trivial   regurgitation. - Left atrium: The atrium was mildly dilated. - Right ventricle: The  cavity size was normal. Wall thickness was   normal. Systolic function was normal. - Atrial septum: There was an atrial septal aneurysm. - Tricuspid valve: There was trivial regurgitation. - Pulmonary arteries: Systolic pressure was within the normal   range.   Cath 2004 CONCLUSIONS:  1. Widely patent bypass grafts.  2. Normal left ventricular systolic function.   The only area that is not protected appears to be the first diagonal, which  has high-grade proximal LAD disease and moderate-to-severe disease in its  midportion.  This would be a high risk/complicated intervention.  This does  not explain the substantial anteroseptal to apical ischemia noted on the  nuclear study.  I suspect the nuclear study was false positive.   ECG:  HR 61  Sinus rhythm with 1st degree A-V block Right bundle branch block Q waves in III seen in previous tracings  Radiology:  Dg Chest 2 View  Result Date: 09/01/2016 CLINICAL DATA:  Intermittent chest pain EXAM: CHEST  2 VIEW COMPARISON:  July 14, 2016 FINDINGS: The heart size and mediastinal contours are within  normal limits. Both lungs are clear. The visualized skeletal structures are unremarkable. IMPRESSION: No active cardiopulmonary disease. Electronically Signed   By: Gerome Samavid  Williams III M.D   On: 09/01/2016 11:53   Ct Abdomen Pelvis W Contrast  Result Date: 09/01/2016 CLINICAL DATA:  Chest pain.  Dizziness.  Nausea. EXAM: CT ABDOMEN AND PELVIS WITH CONTRAST TECHNIQUE: Multidetector CT imaging of the abdomen and pelvis was performed using the standard protocol following bolus administration of intravenous contrast. CONTRAST:  100mL ISOVUE-300 IOPAMIDOL (ISOVUE-300) INJECTION 61% COMPARISON:  None. FINDINGS: Lower chest: Mild basilar atelectasis.  Heart is mildly enlarged. Hepatobiliary: Unremarkable liver. Gallbladder surgically absent. Mild intrahepatic bile duct dilation, presumed chronic. Common bile duct is normal caliber. Pancreas: Partial  fatty replacement. No mass or inflammation. No duct dilation. Spleen: Normal in size without focal abnormality. Adrenals/Urinary Tract: No adrenal masses. 15 mm right midpole renal cyst. No other renal masses, no stones and no hydronephrosis. Normal ureters. Bladder is unremarkable. Stomach/Bowel: Stomach and small bowel unremarkable. There are scattered colonic diverticula. No diverticulitis. Colon is otherwise unremarkable. Vascular/Lymphatic: There are scattered prominent gastrohepatic ligament and retroperitoneal lymph nodes. Largest is a gastrohepatic node measuring 11 mm in short axis. Mild atherosclerotic that there are atherosclerotic calcifications along a normal caliber abdominal aorta and iliac vessels appear Reproductive: Status post hysterectomy. No adnexal masses. Other: No abdominal wall hernia or abnormality. No abdominopelvic ascites. Musculoskeletal: Chronic fracture of L4 stable from a lumbar MRI dated 04/27/2015. There are degenerative changes of visualized spine and arthropathic changes of the left hip. No osteoblastic or osteolytic lesions. IMPRESSION: 1. No acute findings within the abdomen or pelvis. 2. Status post cholecystectomy and hysterectomy. 3. Colonic diverticula without diverticulitis. 4. Simple appearing right midpole renal cyst. 5. Scattered prominent lymph nodes all presumed reactive. 6. Aortic atherosclerosis. Electronically Signed   By: Amie Portlandavid  Ormond M.D.   On: 09/01/2016 14:30    ASSESSMENT AND PLAN:    Principal Problem:   Chest pain Active Problems:   DM type 2 goal A1C below 7.5   Chronic diastolic CHF (congestive heart failure) (HCC)   CVA (cerebral infarction): Early subacute   Essential hypertension   2Nd degree AV block   Coronary artery disease involving coronary bypass graft of native heart without angina pectoris  Sierra Wright is a 82 y.o. female with a history of CAD s/p CABG (1999), post op afib, HTN, DMT2, RBBB, chronic diastolic CHF,  hypothyroidism, recent CVA (07/2016) and limited mobility 2/2 OA who [facility]01/16 with chest pain.   Chest pain: troponin neg x2 and ECG with no acute ST or TW changes. Last ischemic eval was in 2004 with a false positive stress test and cath showing patent bypass grafts. Chest pain lasted 2 hours and self resolved. None since. Plan for discharge home with outpatient stress testing vs inpatient stress testing today. She is NPO.   CAD s/p CABG: continue plavix and BB. Intolerant to statins  HTN: with moderate control. BP mildly elevated this AM. Could increase amlodipine from 5mg --> 10mg  for antianginal effects as well. Lopressor recently decreased to 12.5mg  BID on most recent admission due to 2nd deg AV block Type I.   HLD: intolerance to cholesterol lowering drugs including statins, ezetimibe, and Welchol  Chronic diastolic CHF: she appears euvolemic. Continue home dose of lasix.  RBBB: chronic   Recent CVA: continue on plavix.   Signed: Cline CrockKathryn Thompson, PA-C 09/02/2016 8:26 AM  Pager [phone]  Co-Sign MD   History and  all data above reviewed.  Patient examined.  I agree with the findings as above.  The patient had one episode of pain.  This was 4/10 and self limited.  She had no further symptoms.  EKG had no acute changes and enzymes x 3 have been negative.   The patient exam reveals COR:RRR  ,  Lungs: Clear  ,  Abd: Positive bowel sounds, no rebound no guarding, Ext No edema  .  All available labs, radiology testing, previous records reviewed. Agree with documented assessment and plan. Chest pain:  No objective evidence of ischemia.  No further symptoms.  She can go home.  Given the history of CAD she should have stress testing but would not be able to walk on a treadmill.  Therefore, she should have Lexiscan Myoview as an outpatient.    Rollene Rotunda  9:56 AM  09/02/2016

## 2016-09-04 NOTE — Discharge Summary (Signed)
Physician Discharge Summary  CORNISHA ZETINO AVW:[id number] DOB: 1934-10-11 DOA: 09/01/2016  PCP: Emeterio Reeve, MD  Admit date: 09/01/2016 Discharge date: 09/02/2016  Admitted From: Home Disposition:  Home  Recommendations for Outpatient Follow-up:  1. Follow up with PCP in 1-2 weeks 2. Patient has been placed on nitroglycerin as needed for chest pain. 3. Patient will follow-up with cardiology for a Lexiscan Myoview as an outpatient.  Home Health: No Equipment/Devices: No  Discharge Condition: Stable CODE STATUS: DNR  Diet recommendation: Heart Healthy / Carb Modified   Brief/Interim Summary: This is a 82 year old female who presented to the hospital with the chief complaint of chest pain. She had a coronary artery bypass grafting 20 years ago. Pain was localized in the lower mid chest/epigastric region, dull and sharp in nature, nonradiating, lasted longer than 5 minutes, it was not related to by mouth intake or activity but with nausea as well as shortness of breath. On initial physical examination her blood pressure was 164/72, heart rate 64, respiratory rate 15, oxygen saturation 90%, her oral mucosa was moist, her lungs were clear to auscultation bilaterally, heart S1-S2 present and rhythmic, abdomen was soft and nontender, lower extremity is no edema. Her sodium was 134, potassium 4.1, creatinine 0.94, BUN 27, lipase 20, cardiac enzymes were negative. CT of the abdomen and pelvis was negative for acute pathology. Her chest film was negative for infiltrates, noted increased lung markings. Her EKG showed sinus rhythm with a first-degree AV block, right bundle branch block. (Old changes).   Patient was admitted to the hospital with the working diagnosis of atypical chest pain to rule out acute coronary syndrome.  1. Chest pain. Patient remained chest pain-free in the hospital, serial EKGs and serial cardiac enzymes were negative for acute coronary syndrome. Patient was  continued on antiplatelet therapy with clopidogrel, as well as omega-3 fatty acids. Her lipid profile from August 2017 showed an LDL of 91 with HDL of 39. Cardiology was consulted, patient will have a Lexiscan Myoview as an outpatient. Patient has been placed on nitroglycerin sublingual as needed for chest pain.  2. Hypertension. Blood pressure remained controlled in the hospital with systolic between 829 and 157. She was continued on amlodipine, benazepril, furosemide, metoprolol, hydralazine.  3. Type 2 diabetes mellitus. Patient was placed on insulin sliding scale for glucose coverage and monitoring. Serum glucose remained between 123 and 228. At the time of discharge she will resume taking metformin and glimeperide.   4. Hypothyroidism. Patient will continue taking levothyroxine.  5. Anxiety. Patient will continue taking diazepam and tizanidine per her home regimen.   Discharge Diagnoses:  Principal Problem:   Chest pain Active Problems:   DM type 2 goal A1C below 7.5   Chronic diastolic CHF (congestive heart failure) (HCC)   CVA (cerebral infarction): Early subacute   Essential hypertension   2Nd degree AV block   Coronary artery disease involving coronary bypass graft of native heart without angina pectoris    Discharge Instructions      Discharge Instructions    Diet - low sodium heart healthy    Complete by:  As directed   Discharge instructions    Complete by:  As directed   Please follow with primary care in 7 days. Please follow with cardiology as scheduled.   Increase activity slowly    Complete by:  As directed       Medication List    STOP taking these medications   ibuprofen 400 MG tablet Commonly known  as:  ADVIL,MOTRIN    TAKE these medications   amLODipine 5 MG tablet Commonly known as:  NORVASC Take 1 tablet (5 mg total) by mouth daily.  benazepril 40 MG tablet Commonly known as:  LOTENSIN Take 1 tablet (40 mg total) by mouth daily.   CALCIUM 600/VITAMIN D3 600-800 MG-UNIT Tabs Generic drug:  Calcium Carb-Cholecalciferol Take 1 tablet by mouth 2 (two) times daily.  carbidopa-levodopa 25-100 MG tablet Commonly known as:  SINEMET IR Take half tablet at bedtime for 3 days, then increase to 1 tablet at bedtime. What changed:  how much to take  how to take this  when to take this  additional instructions  clopidogrel 75 MG tablet Commonly known as:  PLAVIX Take 1 tablet (75 mg total) by mouth daily.  diazepam 10 MG tablet Commonly known as:  VALIUM Take 10 mg by mouth every 6 (six) hours as needed for anxiety.  fish oil-omega-3 fatty acids 1000 MG capsule Take 1-2 g by mouth See admin instructions. 1 capsule in the morning then 2 capsules at lunch then 1 capsule in the evening  furosemide 40 MG tablet Commonly known as:  LASIX TAKE ONE TABLET BY MOUTH TWICE DAILY. What changed:  See the new instructions.  gabapentin 100 MG capsule Commonly known as:  NEURONTIN Take 1 capsule (100 mg total) by mouth at bedtime. After one week, patient may increase to 200 mg qhs What changed:  how much to take  additional instructions  Garlic 1000 MG Caps Take 1,000 mg by mouth daily.  glimepiride 4 MG tablet Commonly known as:  AMARYL Take 4 mg by mouth daily with breakfast.  hydrALAZINE 25 MG tablet Commonly known as:  APRESOLINE TAKE ONE TABLET BY MOUTH THREE TIMES DAILY What changed:  See the new instructions.  levothyroxine 112 MCG tablet Commonly known as:  SYNTHROID, LEVOTHROID Take 112 mcg by mouth daily before breakfast.  metFORMIN 500 MG tablet Commonly known as:  GLUCOPHAGE Take 500 mg by mouth 3 (three) times daily.  metoprolol tartrate 25 MG tablet Commonly known as:  LOPRESSOR Take 0.5 tablets (12.5 mg total) by mouth 2 (two) times daily.  nitroGLYCERIN 0.4 MG SL tablet Commonly known as:  NITROSTAT Place 1 tablet (0.4 mg total) under the tongue every 5 (five) minutes as needed for chest pain. If  need to take more than 3 times (every 5 minutes), please call you doctor.  potassium chloride 10 MEQ tablet Commonly known as:  K-DUR,KLOR-CON Take 10 mEq by mouth 4 (four) times daily.  tizanidine 2 MG capsule Commonly known as:  ZANAFLEX Take 1 capsule (2 mg total) by mouth at bedtime.  vitamin B-12 1000 MCG tablet Commonly known as:  CYANOCOBALAMIN Take 1,000 mcg by mouth daily.  VITAMIN C ER PO Take 1,000 mg by mouth daily.        Follow-up Information    Kristeen MissPhilip [name], MD .   Specialty:  Cardiology Why:  The office will contact you to make an appointment for follow up and a stress test Contact information: [address] [place] [address] [phone]               Allergies  Allergen Reactions  . Statins Other (See Comments)    Causes myalgias  . Amitriptyline Other (See Comments)    Pt. States that it caused it to be suicidal.   . Benadryl [Diphenhydramine Hcl (Sleep)] Other (See Comments)    Makes the patient very "hyper"  .  Tylenol [Acetaminophen] Nausea Only  . Welchol [Colesevelam Hcl] Other (See Comments)    Causes dizziness    Consultations:  Cardiology   Procedures/Studies: Imaging Results   Dg Chest 2 View  Result Date: 09/01/2016 CLINICAL DATA:  Intermittent chest pain EXAM: CHEST  2 VIEW COMPARISON:  July 14, 2016 FINDINGS: The heart size and mediastinal contours are within normal limits. Both lungs are clear. The visualized skeletal structures are unremarkable. IMPRESSION: No active cardiopulmonary disease. Electronically Signed   By: Gerome Sam III M.D   On: 09/01/2016 11:53   Ct Abdomen Pelvis W Contrast  Result Date: 09/01/2016 CLINICAL DATA:  Chest pain.  Dizziness.  Nausea. EXAM: CT ABDOMEN AND PELVIS WITH CONTRAST TECHNIQUE: Multidetector CT imaging of the abdomen and pelvis was performed using the standard protocol following bolus administration of intravenous contrast. CONTRAST:   ISOVUE-300 IOPAMIDOL (ISOVUE-300) INJECTION 61% COMPARISON:  None. FINDINGS: Lower chest: Mild basilar atelectasis.  Heart is mildly enlarged. Hepatobiliary: Unremarkable liver. Gallbladder surgically absent. Mild intrahepatic bile duct dilation, presumed chronic. Common bile duct is normal caliber. Pancreas: Partial fatty replacement. No mass or inflammation. No duct dilation. Spleen: Normal in size without focal abnormality. Adrenals/Urinary Tract: No adrenal masses. 15 mm right midpole renal cyst. No other renal masses, no stones and no hydronephrosis. Normal ureters. Bladder is unremarkable. Stomach/Bowel: Stomach and small bowel unremarkable. There are scattered colonic diverticula. No diverticulitis. Colon is otherwise unremarkable. Vascular/Lymphatic: There are scattered prominent gastrohepatic ligament and retroperitoneal lymph nodes. Largest is a gastrohepatic node measuring 11 mm in short axis. Mild atherosclerotic that there are atherosclerotic calcifications along a normal caliber abdominal aorta and iliac vessels appear Reproductive: Status post hysterectomy. No adnexal masses. Other: No abdominal wall hernia or abnormality. No abdominopelvic ascites. Musculoskeletal: Chronic fracture of L4 stable from a lumbar MRI dated 04/27/2015. There are degenerative changes of visualized spine and arthropathic changes of the left hip. No osteoblastic or osteolytic lesions. IMPRESSION: 1. No acute findings within the abdomen or pelvis. 2. Status post cholecystectomy and hysterectomy. 3. Colonic diverticula without diverticulitis. 4. Simple appearing right midpole renal cyst. 5. Scattered prominent lymph nodes all presumed reactive. 6. Aortic atherosclerosis. Electronically Signed   By: Amie Portland M.D.   On: 09/01/2016 14:30         Subjective: Patient is chest pain-free, no dyspnea, nausea or vomiting.  Discharge Exam:     Vitals:   09/02/16 0755 09/02/16 0850  BP: (!) 124/55 (!) 157/63   Pulse: 69 63  Resp: 15   Temp: 98.7 F (37.1 C)          Vitals:   09/01/16 2349 09/02/16 0531 09/02/16 0755 09/02/16 0850  BP: (!) 121/47 (!) 130/54 (!) 124/55 (!) 157/63  Pulse:   69 63  Resp: 16 17 15    Temp: 99 F (37.2 C) 98.1 F (36.7 C) 98.7 F (37.1 C)   TempSrc: Oral Oral Oral   SpO2: 96% 97% 98%   Weight:  91.5 kg (201 lb 12.8 oz)    Height:        General: Pt is alert, awake, not in acute distress Cardiovascular: RRR, S1/S2 +, no rubs, no gallops Respiratory: CTA bilaterally, no wheezing, no rhonchi Abdominal: Soft, NT, ND, bowel sounds + Extremities: no edema, no cyanosis     The results of significant diagnostics from this hospitalization (including imaging, microbiology, ancillary and laboratory) are listed below for reference.     Microbiology: No results found for this or any previous visit (  from the past 240 hour(s)).   Labs: BNP (last 3 results) Recent Labs (within last 365 days)  No results for input(s): BNP in the last 8760 hours.   Basic Metabolic Panel:  Last Labs    Recent Labs Lab 09/01/16 1053 09/02/16 0437  NA 134* 140  K 4.1 4.0  CL 104 105  CO2 19* 25  GLUCOSE 199* 125*  BUN 27* 23*  CREATININE 0.94 0.95  CALCIUM 9.0 9.2     Liver Function Tests:  Last Labs    Recent Labs Lab 09/01/16 1053  AST 19  ALT 12*  ALKPHOS 64  BILITOT 0.7  PROT 5.7*  ALBUMIN 3.6      Last Labs    Recent Labs Lab 09/01/16 1053  LIPASE 20     Last Labs   No results for input(s): AMMONIA in the last 168 hours.   CBC:  Last Labs    Recent Labs Lab 09/01/16 1053 09/01/16 2219  WBC 12.1* 9.1  NEUTROABS 8.4*  --   HGB 14.0 12.7  HCT 42.6 39.2  MCV 93.2 94.0  PLT 180 159     Cardiac Enzymes:  Last Labs    Recent Labs Lab 09/01/16 1802 09/01/16 2219  TROPONINI <0.03 <0.03     BNP: Last Labs   Invalid input(s): POCBNP   CBG:  Last Labs    Recent Labs Lab  09/01/16 1735 09/01/16 2117 09/02/16 0745  GLUCAP 123* 228* 148*     D-Dimer Recent Labs (last 2 labs)   No results for input(s): DDIMER in the last 72 hours.   Hgb A1c Recent Labs (last 2 labs)   No results for input(s): HGBA1C in the last 72 hours.   Lipid Profile Recent Labs (last 2 labs)   No results for input(s): CHOL, HDL, LDLCALC, TRIG, CHOLHDL, LDLDIRECT in the last 72 hours.   Thyroid function studies  Recent Labs (last 2 labs)   No results for input(s): TSH, T4TOTAL, T3FREE, THYROIDAB in the last 72 hours.  Invalid input(s): FREET3   Anemia work up [employer] (last 2 labs)   No results for input(s): VITAMINB12, FOLATE, FERRITIN, TIBC, IRON, RETICCTPCT in the last 72 hours.   Urinalysis Labs (Brief)          Component Value Date/Time   COLORURINE YELLOW 07/14/2016 0008   APPEARANCEUR CLEAR 07/14/2016 0008   LABSPEC 1.008 07/14/2016 0008   PHURINE 7.0 07/14/2016 0008   GLUCOSEU NEGATIVE 07/14/2016 0008   HGBUR NEGATIVE 07/14/2016 0008   BILIRUBINUR NEGATIVE 07/14/2016 0008   KETONESUR NEGATIVE 07/14/2016 0008   PROTEINUR NEGATIVE 07/14/2016 0008   NITRITE NEGATIVE 07/14/2016 0008   LEUKOCYTESUR MODERATE (A) 07/14/2016 0008     Sepsis Labs Last Labs   Invalid input(s): PROCALCITONIN,  WBC,  LACTICIDVEN   Microbiology No results found for this or any previous visit (from the past 240 hour(s)).   Time coordinating discharge: 45 minutes  SIGNED:   Coralie Keens, MD      [facility] 09/02/2016, 10:45 AM Pager   If 7PM-7AM, please contact night-coverage [email or web address] Password [email or web address]

## 2016-09-23 ENCOUNTER — Other Ambulatory Visit: Payer: Self-pay | Admitting: Neurology

## 2016-10-04 ENCOUNTER — Encounter: Payer: Self-pay | Admitting: Cardiovascular Disease

## 2016-10-04 ENCOUNTER — Ambulatory Visit (INDEPENDENT_AMBULATORY_CARE_PROVIDER_SITE_OTHER): Payer: Medicare Other | Admitting: Cardiovascular Disease

## 2016-10-04 VITALS — BP 124/62 | HR 74 | Ht 64.0 in | Wt 204.8 lb

## 2016-10-04 DIAGNOSIS — I5032 Chronic diastolic (congestive) heart failure: Secondary | ICD-10-CM | POA: Diagnosis not present

## 2016-10-04 DIAGNOSIS — I25810 Atherosclerosis of coronary artery bypass graft(s) without angina pectoris: Secondary | ICD-10-CM

## 2016-10-04 DIAGNOSIS — R0602 Shortness of breath: Secondary | ICD-10-CM

## 2016-10-04 DIAGNOSIS — I2510 Atherosclerotic heart disease of native coronary artery without angina pectoris: Secondary | ICD-10-CM

## 2016-10-04 NOTE — Patient Instructions (Signed)
Medication Instructions:  Your physician recommends that you continue on your current medications as directed. Please refer to the Current Medication list given to you today.   Labwork: None Ordered   Testing/Procedures: Your physician has requested that you have a lexiscan myoview. For further information please visit [email or web address]. Please follow instruction sheet, as given.   Follow-Up: Your physician wants you to follow-up in: 6 months with Dr. [name].  You will receive a reminder letter in the mail two months in advance. If you don't receive a letter, please call our office to schedule the follow-up appointment.   If you need a refill on your cardiac medications before your next appointment, please call your pharmacy.   Thank you for choosing [facility]! Schawn Byas, RN [phone]    

## 2016-10-04 NOTE — Progress Notes (Signed)
Cardiology Office Note   Date:  10/04/2016   ID:  Sierra Wright, DOB 11-15-1934, MRN [id number]  PCP:  Emeterio Reeve, MD  Cardiologist: Cassell Clement MD  --> [name]   Chief Complaint  Patient presents with  . Hypertension     Problem list 1. Coronary artery disease-status post coronary artery bypass grafting 2. Essential hypertension 3.  Chronic diastolic congestive heart failure  Sierra Wright is a 82 y.o. female who presents for a six-month office visit This pleasant 82 year old woman is seen for a followup office visit. Her PCP is Dr. [name]. The patient has a history of known ischemic heart disease. She had successful CABG 16 years ago by Dr. [name]. She has not been experiencing any subsequent chest pain or angina. She notes occasional shortness of breath.  Her last echocardiogram was 03/25/14 and showed an ejection fraction of 65-70% with grade 1 diastolic dysfunction.  She has a history of hypertension and she has a past history of mild fluid retention responding to Lasix. She has intermittent ankle edema. She has occasional mild dizzy spells but no syncope. She has not been experiencing any palpitations or tachycardia. She is relatively sedentary because of significant osteoarthritis of her knees. She arrived in our office today by wheelchair. She sleeps with one pillow under her head and also a pillow under her feet to help with her mild dependent edema. During the day she wears compression hose. She does not have any history of TIA or stroke. She does not have any history of peptic ulcer disease or GI bleeding. She has a history of intolerance to cholesterol lowering drugs including statins, ezetimibe, and Welchol. Family history is positive for ischemic heart disease. Her father died at 29 and a brother died at 79 of heart attacks.  At her last visit we decreased her beta blocker because of bradycardia and we increased her hydralazine because of systolic  hypertension. Since then her blood pressure has been satisfactory. The patient has not been having any chest pain.  She has had no recurrence of the paroxysmal atrial flutter fibrillation which occurred in August 2016 immediately postoperative. she does get short of breath with minimal housework activities.  Her peripheral edema has resolved.  EKG today shows normal sinus rhythm.  Apr 16, 2016:  Pt is seen today .   Recent transfer from Dr. [name].  Has hx of CAD, chronic diastolic CHF,  Essential HTN. No cardiac issues. Is in a wheelchair ,   Uses a walker at home  Avoids salt .   10/04/2016:   Sierra Wright is seen today for follow-up visit. Is very short of breath today since her hospitalization one month ago. She has been in the hospital twice since I last saw her.   She was admitted in October and was seen by Dr. Antoine Poche  on October 1. She had presented with episodes of chest pain. Troponin levels were negative. He recommended an outpatient Lexiscan  Myoview study.  Has not had the stress myoview yet     Past Medical History:  Diagnosis Date  . Arthritis   . Coronary artery disease 1999   CABG  . Depression   . Diabetes mellitus without complication (HCC)    type II; metformin and Amaryl  . Diabetic neuropathy (HCC)   . History of anemia as a child   . HOH (hard of hearing)   . Hypertension   . Hypothyroidism    synthroid  . PONV (postoperative nausea  and vomiting)   . Restless leg syndrome   . Shortness of breath 09/03/12   ECHO- The LA is Moderately Dilated, mild mitral annular calcification. mild pulmonary hypertension, moderate concentric LV hypertrophy. Atrial septum is aneurysmal. Aortic valve appears to be mildly sclerotic.EF 88%  . Urinary incontinence   . Weakness of both legs     Past Surgical History:  Procedure Laterality Date  . ABDOMINAL HYSTERECTOMY    . APPENDECTOMY    . BACK SURGERY    . CARDIAC CATHERIZATION  08/20/03   Widely patent bypass  grafts. Normal left ventricular systolic function.  Marland Kitchen. CARDIAC CATHETERIZATION    . CHOLECYSTECTOMY    . COLONOSCOPY    . CORONARY ARTERY BYPASS GRAFT    . EYE SURGERY     bilateral cataracts  . LUMBAR LAMINECTOMY/DECOMPRESSION MICRODISCECTOMY N/A 07/26/2015   Procedure: BILATERAL Lumbar 3-4 SUBTOTAL HEMILAMINECTOMY WITH LATERAL RECESS DECOMPRESSION;  Surgeon: Kerrin ChampagneJames E Nitka, MD;  Location: [facility] OR;  Service: Orthopedics;  Laterality: N/A;  . SHOULDER SURGERY    . TUBAL LIGATION       Current Outpatient Prescriptions  Medication Sig Dispense Refill  . amLODipine (NORVASC) 5 MG tablet Take 1 tablet (5 mg total) by mouth daily. 30 tablet 11  . Ascorbic Acid (VITAMIN C ER PO) Take 1,000 mg by mouth daily.     . benazepril (LOTENSIN) 40 MG tablet Take 1 tablet (40 mg total) by mouth daily. 30 tablet 11  . Calcium Carb-Cholecalciferol (CALCIUM 600/VITAMIN D3) 600-800 MG-UNIT TABS Take 1 tablet by mouth 2 (two) times daily.    . clopidogrel (PLAVIX) 75 MG tablet Take 1 tablet (75 mg total) by mouth daily. 30 tablet 3  . diazepam (VALIUM) 10 MG tablet Take 10 mg by mouth every 6 (six) hours as needed for anxiety.    . fish oil-omega-3 fatty acids 1000 MG capsule Take 1-2 g by mouth See admin instructions. 1 capsule in the morning then 2 capsules at lunch then 1 capsule in the evening    . furosemide (LASIX) 40 MG tablet Take 40 mg by mouth daily.    . Garlic 1000 MG CAPS Take 1,000 mg by mouth daily.     Marland Kitchen. glimepiride (AMARYL) 4 MG tablet Take 4 mg by mouth daily with breakfast.     . hydrALAZINE (APRESOLINE) 25 MG tablet Take 25 mg by mouth 3 (three) times daily.    Marland Kitchen. levothyroxine (SYNTHROID, LEVOTHROID) 112 MCG tablet Take 112 mcg by mouth daily before breakfast.    . metFORMIN (GLUCOPHAGE) 500 MG tablet Take 500 mg by mouth 3 (three) times daily.     . metoprolol tartrate (LOPRESSOR) 25 MG tablet Take 0.5 tablets (12.5 mg total) by mouth 2 (two) times daily. 60 tablet 3  . nitroGLYCERIN  (NITROSTAT) 0.4 MG SL tablet Place 1 tablet (0.4 mg total) under the tongue every 5 (five) minutes as needed for chest pain. If need to take more than 3 times (every 5 minutes), please call you doctor. 20 tablet 0  . potassium chloride (K-DUR,KLOR-CON) 10 MEQ tablet Take 10 mEq by mouth 4 (four) times daily.    . tizanidine (ZANAFLEX) 2 MG capsule Take 1 capsule (2 mg total) by mouth at bedtime. 30 capsule 5  . vitamin B-12 (CYANOCOBALAMIN) 1000 MCG tablet Take 1,000 mcg by mouth daily.     No current facility-administered medications for this visit.     Allergies:   Statins; Amitriptyline; Benadryl [diphenhydramine hcl (sleep)]; Tylenol [acetaminophen]; and Welchol [  colesevelam hcl]    Social History:  The patient  reports that she has never smoked. She has never used smokeless tobacco. She reports that she does not drink alcohol or use drugs.   Family History:  The patient's family history includes Healthy in her daughter; Heart attack in her father; Heart attack (age of onset: 6756) in her brother; Liver cancer in her sister; Other in her mother; Pneumonia in her mother; Stroke in her sister; Stroke (age of onset: 5556) in her son.    ROS:  Please see the history of present illness.   Otherwise, review of systems are positive for none.   All other systems are reviewed and negative.    PHYSICAL EXAM: VS:  BP 124/62   Pulse 74   Ht 5\' 4"  (1.626 m)   Wt 204 lb 12.8 oz (92.9 kg)   BMI 35.15 kg/m  , BMI Body mass index is 35.15 kg/m. GEN: Well nourished, well developed, in no acute distress  HEENT: normal  Neck: no JVD, carotid bruits, or masses Cardiac: RRR; no murmurs, rubs, or gallops,no edema  Respiratory:  clear to auscultation bilaterally, normal work of breathing GI: soft, nontender, nondistended, + BS MS: no deformity or atrophy  Skin: warm and dry, no rash Neuro:  Strength and sensation are intact    EKG:  EKG is ordered today. The ekg ordered today demonstrates normal  sinus rhythm with first-degree AV block.  Bifascicular block.  Since previous tracing of 07/26/15, sinus rhythm has replaced atrial flutter   Recent Labs: 07/13/2016: TSH 1.037 07/15/2016: Magnesium 2.1 09/01/2016: ALT 12; Hemoglobin 12.7; Platelets 159 09/02/2016: BUN 23; Creatinine, Ser 0.95; Potassium 4.0; Sodium 140    Lipid Panel    Component Value Date/Time   CHOL 151 07/15/2016 0343   TRIG 106 07/15/2016 0343   HDL 39 (L) 07/15/2016 0343   CHOLHDL 3.9 07/15/2016 0343   VLDL 21 07/15/2016 0343   LDLCALC 91 07/15/2016 0343      Wt Readings from Last 3 Encounters:  10/04/16 204 lb 12.8 oz (92.9 kg)  09/02/16 201 lb 12.8 oz (91.5 kg)  07/26/16 201 lb 6 oz (91.3 kg)         ASSESSMENT AND PLAN:  1. ischemic heart disease status post CABG 16 years ago. He had an admission for shortness breath last month. She ruled out or myocardial infarction. Will get a Lexiscan Myoview  2. chronic diastolic CHF, She complains of severe shortness of breath - even in her wheelchair.  Her O2 saturations are normal-99%. Her echocardiogram in August shows normal left ventricular systolic function. She had atrial fibrillation so the diastolic function could not be assessed but she likely has diastolic dysfunction.  3. hypertensive cardiovascular disease  4. diabetes mellitus,type II  5. right bundle branch block, old   6.  Paroxysmal atrial flutter fibrillation, isolated incident which occurred in the postoperative setting and has not recurred, currently maintaining normal sinus rhythm with first degree AV block.  Not on long-term anticoagulation   7. sedentary lifestyle secondary to osteoarthritis of knees. She has seen orthopedic surgery but no discussion of surgical intervention was made.   8. hypothyroidism   Disposition: Continue current medication.  Recheck in 6 months     Kristeen MissPhilip [name], MD  10/04/2016 3:26 PM    [facility] [address],  [address] [place]  [address] Pager [phone] Phone: [phone]; Fax: [phone]

## 2016-10-10 ENCOUNTER — Telehealth (HOSPITAL_COMMUNITY): Payer: Self-pay | Admitting: *Deleted

## 2016-10-10 NOTE — Telephone Encounter (Signed)
Patient given detailed instructions per Myocardial Perfusion Study Information Sheet for the test on 10/12/16 at 1130. Patient notified to arrive 15 minutes early and that it is imperative to arrive on time for appointment to keep from having the test rescheduled.  If you need to cancel or reschedule your appointment, please call the office within 24 hours of your appointment. Failure to do so may result in a cancellation of your appointment, and a $50 no show fee. Patient verbalized understanding.Yago Ludvigsen, Adelene IdlerCynthia W

## 2016-10-12 ENCOUNTER — Ambulatory Visit (HOSPITAL_COMMUNITY): Payer: Medicare Other | Attending: Cardiovascular Disease

## 2016-10-12 DIAGNOSIS — R079 Chest pain, unspecified: Secondary | ICD-10-CM | POA: Insufficient documentation

## 2016-10-12 DIAGNOSIS — R0602 Shortness of breath: Secondary | ICD-10-CM

## 2016-10-12 DIAGNOSIS — Z951 Presence of aortocoronary bypass graft: Secondary | ICD-10-CM | POA: Insufficient documentation

## 2016-10-12 DIAGNOSIS — Z8249 Family history of ischemic heart disease and other diseases of the circulatory system: Secondary | ICD-10-CM | POA: Diagnosis not present

## 2016-10-12 DIAGNOSIS — I2510 Atherosclerotic heart disease of native coronary artery without angina pectoris: Secondary | ICD-10-CM

## 2016-10-12 DIAGNOSIS — I4510 Unspecified right bundle-branch block: Secondary | ICD-10-CM | POA: Insufficient documentation

## 2016-10-12 LAB — MYOCARDIAL PERFUSION IMAGING
LV dias vol: 67 mL (ref 46–106)
LV sys vol: 27 mL
Peak HR: 100 {beats}/min
RATE: 0.26
Rest HR: 67 {beats}/min
SDS: 4
SRS: 2
SSS: 6
TID: 0.9

## 2016-10-12 MED ORDER — TECHNETIUM TC 99M TETROFOSMIN IV KIT
10.1000 | PACK | Freq: Once | INTRAVENOUS | Status: AC | PRN
  Administered 2016-10-12: 10.1 via INTRAVENOUS
  Filled 2016-10-12: qty 11

## 2016-10-12 MED ORDER — REGADENOSON 0.4 MG/5ML IV SOLN
0.4000 mg | Freq: Once | INTRAVENOUS | Status: AC
  Administered 2016-10-12: 0.4 mg via INTRAVENOUS

## 2016-10-12 MED ORDER — AMINOPHYLLINE 25 MG/ML IV SOLN
75.0000 mg | Freq: Once | INTRAVENOUS | Status: AC
  Administered 2016-10-12: 75 mg via INTRAVENOUS

## 2016-10-12 MED ORDER — TECHNETIUM TC 99M TETROFOSMIN IV KIT
31.2000 | PACK | Freq: Once | INTRAVENOUS | Status: AC | PRN
  Administered 2016-10-12: 31.2 via INTRAVENOUS
  Filled 2016-10-12: qty 32

## 2016-10-22 ENCOUNTER — Other Ambulatory Visit: Payer: Self-pay | Admitting: Cardiovascular Disease

## 2016-10-22 MED ORDER — HYDRALAZINE HCL 25 MG PO TABS: 25.0000 mg | ORAL_TABLET | Freq: Three times a day (TID) | ORAL | 3 refills | Status: DC

## 2016-12-21 ENCOUNTER — Ambulatory Visit: Payer: Medicare Other | Admitting: Neurology

## 2016-12-27 ENCOUNTER — Other Ambulatory Visit: Payer: Self-pay | Admitting: *Deleted

## 2016-12-27 MED ORDER — FUROSEMIDE 40 MG PO TABS: 40.0000 mg | ORAL_TABLET | Freq: Every day | ORAL | 3 refills | Status: DC

## 2017-02-09 ENCOUNTER — Other Ambulatory Visit: Payer: Self-pay | Admitting: Cardiovascular Disease

## 2017-04-10 ENCOUNTER — Ambulatory Visit: Payer: Medicare Other | Admitting: Cardiovascular Disease

## 2017-05-06 ENCOUNTER — Encounter (INDEPENDENT_AMBULATORY_CARE_PROVIDER_SITE_OTHER): Payer: Medicare Other | Admitting: Ophthalmology

## 2017-05-08 ENCOUNTER — Other Ambulatory Visit: Payer: Self-pay | Admitting: Cardiovascular Disease

## 2017-07-16 ENCOUNTER — Ambulatory Visit (INDEPENDENT_AMBULATORY_CARE_PROVIDER_SITE_OTHER): Payer: Medicare Other | Admitting: Cardiovascular Disease

## 2017-07-16 ENCOUNTER — Encounter: Payer: Self-pay | Admitting: Cardiovascular Disease

## 2017-07-16 VITALS — BP 134/66 | HR 75 | Ht 64.0 in | Wt 207.6 lb

## 2017-07-16 DIAGNOSIS — I2510 Atherosclerotic heart disease of native coronary artery without angina pectoris: Secondary | ICD-10-CM | POA: Diagnosis not present

## 2017-07-16 MED ORDER — METOPROLOL SUCCINATE ER 25 MG PO TB24: 25.0000 mg | ORAL_TABLET | Freq: Every day | ORAL | 3 refills | Status: DC

## 2017-07-16 NOTE — Patient Instructions (Signed)
Medication Instructions:  STOP Metoprolol tartrate (Lopressor) START Metoprolol succinate (Toprol XL) 25 mg once daily   Labwork: None Ordered   Testing/Procedures: None Ordered   Follow-Up: Your physician wants you to follow-up in: 6 months with Dr. Elease HashimotoNahser.  You will receive a reminder letter in the mail two months in advance. If you don't receive a letter, please call our office to schedule the follow-up appointment.   If you need a refill on your cardiac medications before your next appointment, please call your pharmacy.   Thank you for choosing [facility]! Eligha BridegroomMichelle Swinyer, RN [phone]

## 2017-07-16 NOTE — Progress Notes (Signed)
Cardiology Office Note   Date:  07/16/2017   ID:  Sierra MajorSarah L Fiero, DOB 05/21/1934, MRN [id number]  PCP:  Mila PalmerWolters, Sharon, MD  Cardiologist: Cassell Clementhomas Brackbill MD  --> Rubie Ficco   Chief Complaint  Patient presents with  . Coronary Artery Disease     Problem list 1. Coronary artery disease-status post coronary artery bypass grafting 2. Essential hypertension 3.  Chronic diastolic congestive heart failure  Sierra Wright is a 83 y.o. female who presents for a six-month office visit This pleasant 83 year old woman is seen for a followup office visit. Her PCP is Dr. Mila PalmerSharon Wolters. The patient has a history of known ischemic heart disease. She had successful CABG 16 years ago by Dr. Tyrone SageGerhardt. She has not been experiencing any subsequent chest pain or angina. She notes occasional shortness of breath.  Her last echocardiogram was 03/25/14 and showed an ejection fraction of 65-70% with grade 1 diastolic dysfunction.  She has a history of hypertension and she has a past history of mild fluid retention responding to Lasix. She has intermittent ankle edema. She has occasional mild dizzy spells but no syncope. She has not been experiencing any palpitations or tachycardia. She is relatively sedentary because of significant osteoarthritis of her knees. She arrived in our office today by wheelchair. She sleeps with one pillow under her head and also a pillow under her feet to help with her mild dependent edema. During the day she wears compression hose. She does not have any history of TIA or stroke. She does not have any history of peptic ulcer disease or GI bleeding. She has a history of intolerance to cholesterol lowering drugs including statins, ezetimibe, and Welchol. Family history is positive for ischemic heart disease. Her father died at 8364 and a brother died at 10555 of heart attacks.  At her last visit we decreased her beta blocker because of bradycardia and we increased her hydralazine because of  systolic hypertension. Since then her blood pressure has been satisfactory. The patient has not been having any chest pain.  She has had no recurrence of the paroxysmal atrial flutter fibrillation which occurred in August 2016 immediately postoperative. she does get short of breath with minimal housework activities.  Her peripheral edema has resolved.  EKG today shows normal sinus rhythm.  Apr 16, 2016:  Pt is seen today .   Recent transfer from Dr. Patty SermonsBrackbill.  Has hx of CAD, chronic diastolic CHF,  Essential HTN. No cardiac issues. Is in a wheelchair ,   Uses a walker at home  Avoids salt .   10/04/2016:   Sierra Wright is seen today for follow-up visit. Is very short of breath today since her hospitalization one month ago. She has been in the hospital twice since I last saw her.   She was admitted in October and was seen by Dr. Antoine PocheHochrein  on October 1. She had presented with episodes of chest pain. Troponin levels were negative. He recommended an outpatient Lexiscan  Myoview study.  Has not had the stress myoview yet   Aug. 14, 2018:  Sierra Wright is here to follow-up for her  CAD and shortness of breath.  She had a stress Myoview study in November, 2017 which was normal. She has no ischemia.  Left ventricular ejection fraction 60%.  Past Medical History:  Diagnosis Date  . Arthritis   . Coronary artery disease 1999   CABG  . Depression   . Diabetes mellitus without complication (HCC)    type II;  metformin and Amaryl  . Diabetic neuropathy (HCC)   . History of anemia as a child   . HOH (hard of hearing)   . Hypertension   . Hypothyroidism    synthroid  . PONV (postoperative nausea and vomiting)   . Restless leg syndrome   . Shortness of breath 09/03/12   ECHO- The LA is Moderately Dilated, mild mitral annular calcification. mild pulmonary hypertension, moderate concentric LV hypertrophy. Atrial septum is aneurysmal. Aortic valve appears to be mildly sclerotic.EF 88%  . Urinary  incontinence   . Weakness of both legs     Past Surgical History:  Procedure Laterality Date  . ABDOMINAL HYSTERECTOMY    . APPENDECTOMY    . BACK SURGERY    . CARDIAC CATHERIZATION  08/20/03   Widely patent bypass grafts. Normal left ventricular systolic function.  Marland Kitchen CARDIAC CATHETERIZATION    . CHOLECYSTECTOMY    . COLONOSCOPY    . CORONARY ARTERY BYPASS GRAFT    . EYE SURGERY     bilateral cataracts  . LUMBAR LAMINECTOMY/DECOMPRESSION MICRODISCECTOMY N/A 07/26/2015   Procedure: BILATERAL Lumbar 3-4 SUBTOTAL HEMILAMINECTOMY WITH LATERAL RECESS DECOMPRESSION;  Surgeon: Kerrin Champagne, MD;  Location: [facility] OR;  Service: Orthopedics;  Laterality: N/A;  . SHOULDER SURGERY    . TUBAL LIGATION       Current Outpatient Prescriptions  Medication Sig Dispense Refill  . amLODipine (NORVASC) 5 MG tablet TAKE ONE TABLET BY MOUTH ONCE DAILY 90 tablet 1  . Ascorbic Acid (VITAMIN C ER PO) Take 1,000 mg by mouth daily.     . benazepril (LOTENSIN) 40 MG tablet Take 1 tablet (40 mg total) by mouth daily. 30 tablet 11  . Calcium Carb-Cholecalciferol (CALCIUM 600/VITAMIN D3) 600-800 MG-UNIT TABS Take 1 tablet by mouth 2 (two) times daily.    . clopidogrel (PLAVIX) 75 MG tablet Take 1 tablet (75 mg total) by mouth daily. 30 tablet 3  . diazepam (VALIUM) 10 MG tablet Take 10 mg by mouth every 6 (six) hours as needed for anxiety.    . fish oil-omega-3 fatty acids 1000 MG capsule Take 1 g by mouth See admin instructions. 1 capsule in the morning then 2 capsules at lunch then 1 capsule in the evening    . furosemide (LASIX) 40 MG tablet Take 1 tablet (40 mg total) by mouth daily. 90 tablet 3  . Garlic 1000 MG CAPS Take 1,000 mg by mouth daily.     Marland Kitchen glimepiride (AMARYL) 4 MG tablet Take 1 mg by mouth daily with breakfast.     . hydrALAZINE (APRESOLINE) 25 MG tablet TAKE ONE TABLET BY MOUTH THREE TIMES DAILY 270 tablet 2  . insulin glargine (LANTUS) 100 UNIT/ML injection Inject 20 Units into the skin daily.     Marland Kitchen levothyroxine (SYNTHROID, LEVOTHROID) 112 MCG tablet Take 112 mcg by mouth daily before breakfast.    . metFORMIN (GLUCOPHAGE) 500 MG tablet Take 500 mg by mouth 4 (four) times daily.     . metoprolol tartrate (LOPRESSOR) 25 MG tablet Take 0.5 tablets (12.5 mg total) by mouth 2 (two) times daily. 60 tablet 3  . nitroGLYCERIN (NITROSTAT) 0.4 MG SL tablet Place 1 tablet (0.4 mg total) under the tongue every 5 (five) minutes as needed for chest pain. If need to take more than 3 times (every 5 minutes), please call you doctor. 20 tablet 0  . potassium chloride (K-DUR,KLOR-CON) 10 MEQ tablet Take 10 mEq by mouth 3 (three) times daily.     Marland Kitchen  tizanidine (ZANAFLEX) 2 MG capsule Take 1 capsule (2 mg total) by mouth at bedtime. 30 capsule 5  . vitamin B-12 (CYANOCOBALAMIN) 1000 MCG tablet Take 1,000 mcg by mouth daily.     No current facility-administered medications for this visit.     Allergies:   Statins; Amitriptyline; Benadryl [diphenhydramine hcl (sleep)]; Tylenol [acetaminophen]; and Welchol [colesevelam hcl]    Social History:  The patient  reports that she has never smoked. She has never used smokeless tobacco. She reports that she does not drink alcohol or use drugs.   Family History:  The patient's family history includes Healthy in her daughter; Heart attack in her father; Heart attack (age of onset: 20) in her brother; Liver cancer in her sister; Other in her mother; Pneumonia in her mother; Stroke in her sister; Stroke (age of onset: 58) in her son.    ROS:  Please see the history of present illness.   Otherwise, review of systems are positive for none.   All other systems are reviewed and negative.    PHYSICAL EXAM: VS:  BP 134/66   Pulse 75   Ht 5\' 4"  (1.626 m)   Wt 207 lb 9.6 oz (94.2 kg)   BMI 35.63 kg/m  , BMI Body mass index is 35.63 kg/m. GEN: Well nourished, well developed, in no acute distress  HEENT: normal  Neck: no JVD, carotid bruits, or masses Cardiac: RRR; no  murmurs, rubs, or gallops,no edema  Respiratory:  clear to auscultation bilaterally, normal work of breathing GI: soft, nontender, nondistended, + BS MS: no deformity or atrophy  Skin: warm and dry, no rash Neuro:  Strength and sensation are intact    EKG:  EKG is ordered today. The ekg ordered today Aug 14 , 2018:   NSR at 75.  1st degree AV block  RBBB    Recent Labs: 09/01/2016: ALT 12; Hemoglobin 12.7; Platelets 159 09/02/2016: BUN 23; Creatinine, Ser 0.95; Potassium 4.0; Sodium 140    Lipid Panel    Component Value Date/Time   CHOL 151 07/15/2016 0343   TRIG 106 07/15/2016 0343   HDL 39 (L) 07/15/2016 0343   CHOLHDL 3.9 07/15/2016 0343   VLDL 21 07/15/2016 0343   LDLCALC 91 07/15/2016 0343      Wt Readings from Last 3 Encounters:  07/16/17 207 lb 9.6 oz (94.2 kg)  10/12/16 204 lb (92.5 kg)  10/04/16 204 lb 12.8 oz (92.9 kg)         ASSESSMENT AND PLAN:  1. ischemic heart disease status post CABG:.  Myoview study in late 2017 showed no evidence evidence of ischemia. She has normal left ventricle systolic function. She's having trouble cutting her metoprolol in half. Will change her to metoprolol succinate 25 mg once a day.  2. chronic diastolic CHF, She complains of severe shortness of breath - even in her wheelchair.  Her O2 saturations are normal-99%. Her echocardiogram in August shows normal left ventricular systolic function. She had atrial fibrillation so the diastolic function could not be assessed but she likely has diastolic dysfunction.  3. hypertensive cardiovascular disease  4. diabetes mellitus,type II  5. right bundle branch block, old   6.  Paroxysmal atrial flutter fibrillation, isolated incident which occurred in the postoperative setting and has not recurred, currently maintaining normal sinus rhythm with first degree AV block.  Not on long-term anticoagulation   7. sedentary lifestyle secondary to osteoarthritis of knees. She has seen  orthopedic surgery but no discussion of surgical intervention  was made.      Kristeen Miss, MD  07/16/2017 3:22 PM    [facility] [address] [place],  [address] [place], [place]  [address] Pager [phone] Phone: [phone]; Fax: [phone]

## 2017-08-06 ENCOUNTER — Other Ambulatory Visit: Payer: Self-pay | Admitting: Cardiovascular Disease

## 2017-11-12 ENCOUNTER — Other Ambulatory Visit: Payer: Self-pay | Admitting: Cardiovascular Disease

## 2017-12-09 ENCOUNTER — Encounter: Payer: Self-pay | Admitting: Neurology

## 2017-12-09 ENCOUNTER — Ambulatory Visit: Payer: Medicare Other | Admitting: Neurology

## 2017-12-09 VITALS — BP 104/72 | HR 73 | Ht 64.0 in | Wt 209.0 lb

## 2017-12-09 DIAGNOSIS — E0842 Diabetes mellitus due to underlying condition with diabetic polyneuropathy: Secondary | ICD-10-CM

## 2017-12-09 DIAGNOSIS — G2581 Restless legs syndrome: Secondary | ICD-10-CM

## 2017-12-09 DIAGNOSIS — I639 Cerebral infarction, unspecified: Secondary | ICD-10-CM | POA: Diagnosis not present

## 2017-12-09 DIAGNOSIS — G5621 Lesion of ulnar nerve, right upper limb: Secondary | ICD-10-CM

## 2017-12-09 MED ORDER — GABAPENTIN 300 MG PO CAPS: 300.0000 mg | ORAL_CAPSULE | Freq: Every day | ORAL | 3 refills | Status: DC

## 2017-12-09 MED ORDER — CARBIDOPA-LEVODOPA 25-100 MG PO TABS: 1.0000 | ORAL_TABLET | Freq: Every day | ORAL | 3 refills | Status: DC

## 2017-12-09 NOTE — Patient Instructions (Signed)
Start gabapentin 300mg  at bedtime. Stop gabapentin 100mg  twice daily  Continue carbidopa-levodopa 25/100mg  at bedtime for restless leg syndrome  Continue plavix 75mg  daily  Call with an update in 1 month to let me know how your right arm pain is doing  If no improvement, we will schedule nerve testing of the right arm

## 2017-12-09 NOTE — Progress Notes (Signed)
Follow-up Visit   Date: 12/09/17    Sierra Wright MRN: [id number] DOB: 01/17/1934   Interim History: Sierra Wright is a 84 y.o. right-handed Caucasian female with diabetes mellitus, hypertension, hyperlipidemia, hypothyroidism, CAD s/p CABG, depression/anxiety, CKD stage III, hearing deficit, and recent R thalamic ischemic stroke (no residual deficits) returning to the clinic for follow-up of RLS and neuropathy.  The patient was accompanied to the clinic by husband who also provides collateral information.    History of present illness: She was evaluated by his PCP on July 26th for complains of involuntary leg movements at night and referred her for further evaluation.  She reports having a long history of leg discomfort, described as achy and irritating sensation which is worse at rest.  She sometimes finds herself walking to ease the discomfort, but whenever she rests, symptoms always get worse again. She has tried requip and Lyrica but this did not help.   She has numbness of the legs which is more noticeable at nighttime.  She does not have burning or tingling pain.  She has previously tried trazadone, but developed headaches.  She has been using a walker for 3-4 years.    She also complains of entire right hand numbness which is worse at night.  There is no weakness or neck pain.   She also complains of frontal headaches, described as dull ache, which can last all day.  She has endorses mild nausea.  No photophobia and phonophobia.  She sometimes feels lightheaded and dizzy with the headaches.  She takes Aleve which does not provide much relief.    In the interim, she was hospitalized on 8/11 for low back pain after a fall she suffered while getting out of bed to use the restroom at 3am.  Her plain film imaging did not show any fracture, however, because of concern of facial droop and double vision and gait difficulty MRI brian was obtained and showed a subacute right thalamic  ischemic infarct, likely due to small vessel disease.  US carotids and TEE was unremarkable.  Telemetry showed second degree AV block with bradycardia, so her betablocker was reduced to Lopressor 12.5mg  BID and out-patient follow-up was recommended.  Her aspirin 81mg  was switched to plavix 75mg  daily.  She is not on statin therapy de to statin allergy.  UPDATE 12/09/2017:  She is here with complaints of right ankle numbness, cramps, and toe pain.  She has mild burning and stabbing pain in the toes.  She continues to complain of numbness over the right hand, which is worse at night. Her diabetes is controlled on insulin with last HbA1c 6.1.   She is mostly confined to her transport chair is only stands for transfers.  She takes sinemet at night which helps with her RLS. No interval falls.   Medications:  Current Outpatient Medications on File Prior to Visit  Medication Sig Dispense Refill  . amLODipine (NORVASC) 5 MG tablet TAKE 1 TABLET BY MOUTH ONCE DAILY (Patient taking differently: take 10 mg daily) 90 tablet 2  . Ascorbic Acid (VITAMIN C ER PO) Take 1,000 mg by mouth daily.     . benazepril (LOTENSIN) 40 MG tablet TAKE ONE TABLET BY MOUTH ONCE DAILY 90 tablet 3  . Calcium Carb-Cholecalciferol (CALCIUM 600/VITAMIN D3) 600-800 MG-UNIT TABS Take 1 tablet by mouth 2 (two) times daily.    . clopidogrel (PLAVIX) 75 MG tablet Take 1 tablet (75 mg total) by mouth daily. 30 tablet 3  .  diazepam (VALIUM) 10 MG tablet Take 10 mg by mouth every 6 (six) hours as needed for anxiety.    . fish oil-omega-3 fatty acids 1000 MG capsule Take 1 g by mouth See admin instructions. 1 capsule in the morning then 2 capsules at lunch then 1 capsule in the evening    . furosemide (LASIX) 40 MG tablet Take 1 tablet (40 mg total) by mouth daily. 90 tablet 3  . Garlic 1000 MG CAPS Take 1,000 mg by mouth daily.     Marland Kitchen glimepiride (AMARYL) 4 MG tablet Take 1 mg by mouth daily with breakfast.     . hydrALAZINE (APRESOLINE) 25 MG  tablet TAKE 1 TABLET BY MOUTH THREE TIMES DAILY 270 tablet 2  . insulin glargine (LANTUS) 100 UNIT/ML injection Inject 20 Units into the skin daily.    Marland Kitchen levothyroxine (SYNTHROID, LEVOTHROID) 112 MCG tablet Take 100 mcg by mouth daily before breakfast.     . metFORMIN (GLUCOPHAGE) 500 MG tablet Take 500 mg by mouth 4 (four) times daily.     . metoprolol succinate (TOPROL XL) 25 MG 24 hr tablet Take 1 tablet (25 mg total) by mouth daily. 90 tablet 3  . nitroGLYCERIN (NITROSTAT) 0.4 MG SL tablet Place 1 tablet (0.4 mg total) under the tongue every 5 (five) minutes as needed for chest pain. If need to take more than 3 times (every 5 minutes), please call you doctor. 20 tablet 0  . potassium chloride (K-DUR,KLOR-CON) 10 MEQ tablet Take 10 mEq by mouth 3 (three) times daily.     . vitamin B-12 (CYANOCOBALAMIN) 1000 MCG tablet Take 1,000 mcg by mouth daily.    . tizanidine (ZANAFLEX) 2 MG capsule Take 1 capsule (2 mg total) by mouth at bedtime. (Patient not taking: Reported on 12/09/2017) 30 capsule 5   No current facility-administered medications on file prior to visit.     Allergies:  Allergies  Allergen Reactions  . Statins Other (See Comments)    Causes myalgias  . Amitriptyline Other (See Comments)    Pt. States that it caused it to be suicidal.   . Benadryl [Diphenhydramine Hcl (Sleep)] Other (See Comments)    Makes the patient very "hyper"  . Tylenol [Acetaminophen] Nausea Only  . Welchol [Colesevelam Hcl] Other (See Comments)    Causes dizziness    Review of Systems:  CONSTITUTIONAL: No fevers, chills, night sweats, or weight loss.  EYES: No visual changes or eye pain ENT: No hearing changes.  No history of nose bleeds.   RESPIRATORY: No cough, wheezing and shortness of breath.   CARDIOVASCULAR: Negative for chest pain, and palpitations.   GI: Negative for abdominal discomfort, blood in stools or black stools.  No recent change in bowel habits.   GU:  No history of incontinence.     MUSCLOSKELETAL: No history of joint pain or swelling.  No myalgias.   SKIN: Negative for lesions, rash, and itching.   ENDOCRINE: Negative for cold or heat intolerance, polydipsia or goiter.   PSYCH:  + depression or anxiety symptoms.   NEURO: As Above.   Vital Signs:  BP 104/72   Pulse 73   Ht 5\' 4"  (1.626 m)   Wt 209 lb (94.8 kg)   SpO2 98%   BMI 35.87 kg/m    General: Comfortably sitting in transport chair, overewight  Neurological Exam: MENTAL STATUS including orientation to time, place, person, recent and remote memory, attention span and concentration, language, and fund of knowledge is normal.  Speech  is not dysarthric.  CRANIAL NERVES: No visual field defects. Bilateral surgical pupils. Normal conjugate, extra-ocular eye movements in all directions of gaze.  No ptosis.  Face is symmetric. Palate elevates symmetrically.  Tongue is midline.  MOTOR:  Motor strength is 5/5 in all extremities  No pronator drift.  Tone is normal.    MSRs:  Reflexes are 2+/4 in the upper extremities and absent in the legs.  SENSORY:  Absent vibration distal to knees bilaterally, intact at the MCPs  COORDINATION/GAIT:  Gait not tested due to being wheelchair bound  Data: MRA brain and neck 07/15/2016: Mild atherosclerotic disease in the carotid bulb with 30% diameter stenosis on the right and 25% diameter stenosis on the left  Moderate stenosis distal left vertebral artery due to atherosclerotic disease.  Moderate intracranial atherosclerotic disease as described above. MRI brain 07/13/2016: 1. 9 mm early subacute ischemic nonhemorrhagic infarct involving the medial right thalamus. No associated mass effect. 2. No other acute intracranial process identified. 3. Mild age-related cerebral atrophy with chronic small vessel ischemic disease.  CT cervical spine 07/13/2016: 1. No acute intracranial abnormality or significant interval change. 2. Stable atrophy and white matter disease. This  likely reflects the sequela of chronic microvascular ischemia. 3. Moderate spondylosis in the cervical spine as described. 4. No acute fracture or traumatic subluxation in the cervical spine.  CT head 07/13/2016: 1. No acute intracranial abnormality or significant interval change. 2. Stable atrophy and white matter disease. This likely reflects the sequela of chronic microvascular ischemia. 3. Moderate spondylosis in the cervical spine as described. 4. No acute fracture or traumatic subluxation in the cervical spine.  US carotids 1-39% bilaterally Echo: Normal   IMPRESSION/PLAN: 1.  Diabetic polyneuropathy affecting the feet, diabetes is well-controlled at HbA1c 6.1.  She is complaining of new tingling, burning, and numbness of the feet and will be started on gabapentin 300mg  at bedtime (caution with titration due to CKD)  2.  Restless leg syndrome, well controlled on sinemet 25/100mg  at bedtime.  Refills provided.  3.  Right hand paresthesias, ?ulnar neuropathy vs CTS.  NCS/EMG declined.  Hopefully, gabapentin will help at least control paresthesias  4.  Right thalamic stroke due to small vessel disease, no residual deficits (07/2016).  Continue plavix 75mg  daily.  She has statin allergy (LDL 91)  Call with update in 1 month    The duration of this appointment visit was 25 minutes of face-to-face time with the patient.  Greater than 50% of this time was spent in counseling, explanation of diagnosis, planning of further management, and coordination of care.   Thank you for allowing me to participate in patient's care.  If I can answer any additional questions, I would be pleased to do so.    Sincerely,    Amoni Morales K. Allena KatzPatel, DO

## 2018-01-02 ENCOUNTER — Other Ambulatory Visit: Payer: Self-pay | Admitting: Cardiovascular Disease

## 2018-01-06 ENCOUNTER — Telehealth: Payer: Self-pay | Admitting: Neurology

## 2018-01-06 NOTE — Telephone Encounter (Signed)
Both can cause swelling, gabapentin moreso.  Please have her stop gabapentin and see if the swelling improves.

## 2018-01-06 NOTE — Telephone Encounter (Signed)
Could this be from the gabapentin?  Please advise.

## 2018-01-06 NOTE — Telephone Encounter (Signed)
She was calling to update you on the Medication is helping with her hands but she is noticing swelling in her Feet and legs. She is not sure if the medications Carbidopa Levodopa or Gabapentin were causing it?

## 2018-01-07 ENCOUNTER — Other Ambulatory Visit: Payer: Self-pay | Admitting: Gastroenterology

## 2018-01-07 DIAGNOSIS — R1084 Generalized abdominal pain: Secondary | ICD-10-CM

## 2018-01-07 NOTE — Telephone Encounter (Signed)
Called patient back and gave her the instructions.  She said that she was fine on 200 mg of gabapentin but since she started the 300 mg, she has had swelling.  Instructed her to go back to the 200 mg and see how she does.

## 2018-01-15 ENCOUNTER — Ambulatory Visit
Admission: RE | Admit: 2018-01-15 | Discharge: 2018-01-15 | Disposition: A | Discharge status: Home / Self Care | Payer: Medicare Other | Source: Ambulatory Visit | Attending: Gastroenterology | Admitting: Gastroenterology

## 2018-01-15 DIAGNOSIS — R1084 Generalized abdominal pain: Secondary | ICD-10-CM

## 2018-01-15 MED ORDER — IOPAMIDOL (ISOVUE-300) INJECTION 61%
125.0000 mL | Freq: Once | INTRAVENOUS | Status: AC | PRN
  Administered 2018-01-15: 125 mL via INTRAVENOUS

## 2018-01-22 ENCOUNTER — Other Ambulatory Visit: Payer: Self-pay | Admitting: Gastroenterology

## 2018-02-20 ENCOUNTER — Encounter (HOSPITAL_COMMUNITY): Payer: Self-pay

## 2018-02-20 ENCOUNTER — Ambulatory Visit (HOSPITAL_COMMUNITY): Admit: 2018-02-20 | Payer: Medicare Other | Admitting: Gastroenterology

## 2018-02-20 SURGERY — COLONOSCOPY WITH PROPOFOL
Anesthesia: Monitor Anesthesia Care

## 2018-03-07 ENCOUNTER — Ambulatory Visit: Payer: Medicare Other | Admitting: Neurology

## 2018-03-31 ENCOUNTER — Encounter

## 2018-03-31 ENCOUNTER — Ambulatory Visit: Payer: Medicare Other | Admitting: Neurology

## 2018-04-01 ENCOUNTER — Ambulatory Visit: Payer: Medicare Other | Admitting: Physician Assistant

## 2018-04-01 ENCOUNTER — Encounter: Payer: Self-pay | Admitting: Physician Assistant

## 2018-04-01 ENCOUNTER — Other Ambulatory Visit: Payer: Self-pay

## 2018-04-01 VITALS — BP 168/70 | HR 68 | Ht 64.0 in | Wt 219.0 lb

## 2018-04-01 DIAGNOSIS — I10 Essential (primary) hypertension: Secondary | ICD-10-CM

## 2018-04-01 DIAGNOSIS — I5033 Acute on chronic diastolic (congestive) heart failure: Secondary | ICD-10-CM | POA: Diagnosis not present

## 2018-04-01 DIAGNOSIS — I2510 Atherosclerotic heart disease of native coronary artery without angina pectoris: Secondary | ICD-10-CM

## 2018-04-01 MED ORDER — POTASSIUM CHLORIDE CRYS ER 20 MEQ PO TBCR: 20.0000 meq | EXTENDED_RELEASE_TABLET | Freq: Two times a day (BID) | ORAL | 3 refills | Status: DC

## 2018-04-01 MED ORDER — FUROSEMIDE 20 MG PO TABS: 20.0000 mg | ORAL_TABLET | Freq: Three times a day (TID) | ORAL | 3 refills | Status: DC

## 2018-04-01 MED ORDER — FUROSEMIDE 20 MG PO TABS: 20.0000 mg | ORAL_TABLET | Freq: Three times a day (TID) | ORAL | 4 refills | Status: DC

## 2018-04-01 NOTE — Patient Instructions (Addendum)
Medication Instructions: INCREASE: LASIX TO 60 MG TAKING 1 TABLET 3 TIMES A DAY INCREASE: POTASSIUM TO 20 MEQ TAKING 1 TABLET 2 TIMES A DAY Labwork: None Ordered  Procedures/Testing: None Ordered  Follow-Up: Your physician recommends that you schedule a follow-up appointment in: Follow up with Dr.[name] in 6 months    Any Additional Special Instructions Will Be Listed Below (If Applicable).     If you need a refill on your cardiac medications before your next appointment, please call your pharmacy.

## 2018-04-01 NOTE — Progress Notes (Signed)
Cardiology Office Note    Date:  04/01/2018   ID:  Sierra Wright, DOB Nov 18, 1934, MRN [id number]  PCP:  Sierra [place], MD  Cardiologist:  Dr. Elease Wright  Chief Complaint: 6 months  History of Present Illness:   Sierra Wright is a 84 y.o. female CAD s/p CABG (1999), post op afib, HTN, DMT2, RBBB, AV block type I Sierra Wright), chronic diastolic CHF, hypothyroidism, CVA (07/2016) and limited mobility 2/2 OA who  presents for follow up.   She had bypass surgery in 1999 by Dr. Tyrone Wright. She had a false positive nuclear study in 2004 followed by a heart cath which showed patent bypass grafts.   Admitted 8/11-8/13/17 for acute CVA. She was found to have a 9 mm early subacute ischemic nonhemorrhagic infarct involving right thalamus on MRI. ASA was changed to plavix. Cardiology saw on this admission foroccasional second degree AV block type I Sierra Wright) seen on telemetry. No evidence of high grade block and felt to be an overall fairly benign finding. Also felt to be potentially mediaction related (lopressor) or post CVA from increased parasympathetic tone. Her lopressor was decreased to 12.5mg  bid. Echo showed LVEF of 60-65%.   Low risk Myoview on 10/2016. He was doing well on cardiac stand point when last seen by Dr. Elease Wright 07/2017.  Here today for follow up.  Recently dealing with volume overload.  PCP has increased Lasix for few days.  She felt better and lost weight.  However for the past few days her weight is again trending up.  Compliant with low-sodium diet.  Wheelchair-bound chronically.  She has orthopnea and lower extremity edema.  Chronic shortness of breath with ambulation.  Past Medical History:  Diagnosis Date  . Arthritis   . Coronary artery disease 1999   CABG  . Depression   . Diabetes mellitus without complication (HCC)    type II; metformin and Amaryl  . Diabetic neuropathy (HCC)   . History of anemia as a child   . HOH (hard of hearing)   . Hypertension   .  Hypothyroidism    synthroid  . PONV (postoperative nausea and vomiting)   . Restless leg syndrome   . Shortness of breath 09/03/12   ECHO- The LA is Moderately Dilated, mild mitral annular calcification. mild pulmonary hypertension, moderate concentric LV hypertrophy. Atrial septum is aneurysmal. Aortic valve appears to be mildly sclerotic.EF 88%  . Urinary incontinence   . Weakness of both legs     Past Surgical History:  Procedure Laterality Date  . ABDOMINAL HYSTERECTOMY    . APPENDECTOMY    . BACK SURGERY    . CARDIAC CATHERIZATION  08/20/03   Widely patent bypass grafts. Normal left ventricular systolic function.  Marland Kitchen CARDIAC CATHETERIZATION    . CHOLECYSTECTOMY    . COLONOSCOPY    . CORONARY ARTERY BYPASS GRAFT    . EYE SURGERY     bilateral cataracts  . LUMBAR LAMINECTOMY/DECOMPRESSION MICRODISCECTOMY N/A 07/26/2015   Procedure: BILATERAL Lumbar 3-4 SUBTOTAL HEMILAMINECTOMY WITH LATERAL RECESS DECOMPRESSION;  Surgeon: Sierra Champagne, MD;  Location: [facility] OR;  Service: Orthopedics;  Laterality: N/A;  . SHOULDER SURGERY    . TUBAL LIGATION      Current Medications: Prior to Admission medications   Medication Sig Start Date End Date Taking? Authorizing Provider  amLODipine (NORVASC) 5 MG tablet TAKE 1 TABLET BY MOUTH ONCE DAILY Patient taking differently: take 10 mg daily 11/13/17   Wright, Sierra Ping, MD  Ascorbic Acid (VITAMIN C  ER PO) Take 1,000 mg by mouth daily.     [provider]  benazepril (LOTENSIN) 40 MG tablet TAKE ONE TABLET BY MOUTH ONCE DAILY 08/07/17   Wright, Sierra Ping, MD  Calcium Carb-Cholecalciferol (CALCIUM 600/VITAMIN D3) 600-800 MG-UNIT TABS Take 1 tablet by mouth 2 (two) times daily.    [provider]  carbidopa-levodopa (SINEMET IR) 25-100 MG tablet Take 1 tablet by mouth at bedtime. 12/09/17   Sierra Sickle K, DO  clopidogrel (PLAVIX) 75 MG tablet Take 1 tablet (75 mg total) by mouth daily. 07/15/16   Sierra Bong, MD  diazepam (VALIUM)  10 MG tablet Take 10 mg by mouth every 6 (six) hours as needed for anxiety.    [provider]  fish oil-omega-3 fatty acids 1000 MG capsule Take 1 g by mouth See admin instructions. 1 capsule in the morning then 2 capsules at lunch then 1 capsule in the evening    [provider]  furosemide (LASIX) 40 MG tablet TAKE ONE TABLET BY MOUTH ONCE DAILY 01/02/18   Wright, Sierra Ping, MD  gabapentin (NEURONTIN) 300 MG capsule Take 1 capsule (300 mg total) by mouth at bedtime. 12/09/17   Patel, Roxana Hires K, DO  Garlic 1000 MG CAPS Take 1,000 mg by mouth daily.     [provider]  glimepiride (AMARYL) 4 MG tablet Take 1 mg by mouth daily with breakfast.     [provider]  hydrALAZINE (APRESOLINE) 25 MG tablet TAKE 1 TABLET BY MOUTH THREE TIMES DAILY 11/13/17   Wright, Sierra Ping, MD  insulin glargine (LANTUS) 100 UNIT/ML injection Inject 20 Units into the skin daily.    [provider]  levothyroxine (SYNTHROID, LEVOTHROID) 112 MCG tablet Take 100 mcg by mouth daily before breakfast.     [provider]  metFORMIN (GLUCOPHAGE) 500 MG tablet Take 500 mg by mouth 4 (four) times daily.     [provider]  metoprolol succinate (TOPROL XL) 25 MG 24 hr tablet Take 1 tablet (25 mg total) by mouth daily. 07/16/17   Wright, Sierra Ping, MD  nitroGLYCERIN (NITROSTAT) 0.4 MG SL tablet Place 1 tablet (0.4 mg total) under the tongue every 5 (five) minutes as needed for chest pain. If need to take more than 3 times (every 5 minutes), please call you doctor. 09/02/16   Wright, Sierra Ram, MD  potassium chloride (Wright-DUR,KLOR-CON) 10 MEQ tablet Take 10 mEq by mouth 3 (three) times daily.  03/15/14   Sierra Clement, MD  tizanidine (ZANAFLEX) 2 MG capsule Take 1 capsule (2 mg total) by mouth at bedtime. Patient not taking: Reported on 12/09/2017 07/26/16   Sierra Sickle K, DO  vitamin B-12 (CYANOCOBALAMIN) 1000 MCG tablet Take 1,000 mcg by mouth daily.    [provider]    Allergies:   Statins; Amitriptyline; Benadryl [diphenhydramine hcl (sleep)]; Tylenol [acetaminophen]; and Welchol [colesevelam hcl]   Social History   Socioeconomic History  . Marital status: Married    Spouse name: Not on file  . Number of children: Not on file  . Years of education: Not on file  . Highest education level: Not on file  Occupational History  . Not on file  Social Needs  . Financial resource strain: Not on file  . Food insecurity:    Worry: Not on file    Inability: Not on file  . Transportation needs:    Medical: Not on file    Non-medical: Not on file  Tobacco Use  .  Smoking status: Never Smoker  . Smokeless tobacco: Never Used  Substance and Sexual Activity  . Alcohol use: No  . Drug use: No  . Sexual activity: Not on file  Lifestyle  . Physical activity:    Days per week: Not on file    Minutes per session: Not on file  . Stress: Not on file  Relationships  . Social connections:    Talks on phone: Not on file    Gets together: Not on file    Attends religious service: Not on file    Active member of club or organization: Not on file    Attends meetings of clubs or organizations: Not on file    Relationship status: Not on file  Other Topics Concern  . Not on file  Social History Narrative   Lives with husband in a 1 story home.  Has 1 daughter.   Education: high school.     Worked as a Futures trader.      Family History:  The patient's family history includes Healthy in her daughter; Heart attack in her father; Heart attack (age of onset: 92) in her brother; Liver cancer in her sister; Other in her mother; Pneumonia in her mother; Stroke in her sister; Stroke (age of onset: 86) in her son.   ROS:   Please see the history of present illness.    ROS All other systems reviewed and are negative.   PHYSICAL EXAM:   VS:  BP (!) 168/70   Pulse 68   Ht  (1.626 m)   Wt 219 lb (99.3 kg)   SpO2 95%   BMI 37.59 kg/m    GEN:  Well nourished, well developed, in no acute distress  HEENT: normal  Neck: no JVD, carotid bruits, or masses Cardiac: RRR; no murmurs, rubs, or gallops, 1-2 + bilateral lower extremity edema Respiratory:  clear to auscultation bilaterally, normal work of breathing GI: soft, nontender, nondistended, + BS MS: no deformity or atrophy  Skin: warm and dry, no rash Neuro:  Alert and Oriented x 3, Strength and sensation are intact Psych: euthymic mood, full affect  Wt Readings from Last 3 Encounters:  04/01/18 219 lb (99.3 kg)  12/09/17 209 lb (94.8 kg)  07/16/17 207 lb 9.6 oz (94.2 kg)    Acute on chronic diastolic heart failure  Studies/Labs Reviewed:   EKG:  EKG is ordered today.  The ekg ordered today demonstrates normal sinus rhythm with chronic right bundle branch block  Recent Labs: No results found for requested labs within last 8760 hours.   Lipid Panel    Component Value Date/Time   CHOL 151 07/15/2016 0343   TRIG 106 07/15/2016 0343   HDL 39 (L) 07/15/2016 0343   CHOLHDL 3.9 07/15/2016 0343   VLDL 21 07/15/2016 0343   LDLCALC 91 07/15/2016 0343    Additional studies/ records that were reviewed today include:   Echocardiogram: 07/14/16 Study Conclusions  - Left ventricle: The cavity size was normal. Systolic function was   normal. The estimated ejection fraction was in the range of 60%   to 65%. Wall motion was normal; there were no regional wall   motion abnormalities. - Aortic valve: Transvalvular velocity was within the normal range.   There was no stenosis. There was no regurgitation. - Mitral valve: Transvalvular velocity was within the normal range.   There was no evidence for stenosis. There was trivial   regurgitation. - Left atrium: The atrium was mildly dilated. - Right  ventricle: The cavity size was normal. Wall thickness was   normal. Systolic function was normal. - Atrial septum: There was an atrial septal aneurysm. - Tricuspid valve: There was  trivial regurgitation. - Pulmonary arteries: Systolic pressure was within the normal   range.   ASSESSMENT & PLAN:    1. Acute on chronic diastolic heart failure Dry cough with orthopnea and lower extremity edema.  Felt better when she was on a higher dose of Lasix.  Compliant with low-sodium diet.  Will increase Lasix to 60 mg daily and Wright-Dur to 20 mcg twice daily.  She has follow-up with PCP in 3 weeks where she will get her  bmed check at that time.  2.  Hypertension -Elevated today.  Lasix change as above.  I have advised her to keep blood pressure log and bring during PCP visit.  No change in other therapy made today.  3.  CAD status post CABG -Wheelchair-bound.  Denies chest pain.  Continue Plavix, Toprol-XL.  Statin intolerant   F/u with Dr. Elease Wright in 6 months. She will give [place] a call if worsening of symptoms. Encouraged daily weight in morning.   Medication Adjustments/Labs and Tests Ordered: Current medicines are reviewed at length with the patient today.  Concerns regarding medicines are outlined above.  Medication changes, Labs and Tests ordered today are listed in the Patient Instructions below. Patient Instructions  Medication Instructions: INCREASE: LASIX TO 60 MG TAKING 1 TABLET 3 TIMES A DAY INCREASE: POTASSIUM TO 20 MEQ TAKING 1 TABLET 2 TIMES A DAY Labwork: None Ordered  Procedures/Testing: None Ordered  Follow-Up: Your physician recommends that you schedule a follow-up appointment in: Follow up with Dr.[name] in 6 months    Any Additional Special Instructions Will Be Listed Below (If Applicable).     If you need a refill on your cardiac medications before your next appointment, please call your pharmacy.      Lorelei Pont, [place]  04/01/2018 4:22 PM    [facility] [address] [place], [place], [place]  [address] Phone: [phone]; Fax: [phone]

## 2018-07-05 ENCOUNTER — Other Ambulatory Visit: Payer: Self-pay | Admitting: Cardiovascular Disease

## 2018-08-01 ENCOUNTER — Other Ambulatory Visit: Payer: Self-pay | Admitting: Cardiovascular Disease

## 2018-08-11 ENCOUNTER — Inpatient Hospital Stay (HOSPITAL_COMMUNITY)
Admission: EM | Admit: 2018-08-11 | Discharge: 2018-08-14 | DRG: 287 | Disposition: A | Discharge status: Home / Self Care | Payer: Medicare Other | Source: Ambulatory Visit | Attending: Cardiovascular Disease | Admitting: Cardiovascular Disease

## 2018-08-11 ENCOUNTER — Encounter: Payer: Self-pay | Admitting: Cardiovascular Disease

## 2018-08-11 ENCOUNTER — Other Ambulatory Visit: Payer: Self-pay | Admitting: Cardiovascular Disease

## 2018-08-11 ENCOUNTER — Encounter (HOSPITAL_COMMUNITY): Payer: Self-pay | Admitting: Emergency Medicine

## 2018-08-11 ENCOUNTER — Telehealth: Payer: Self-pay | Admitting: Cardiovascular Disease

## 2018-08-11 ENCOUNTER — Other Ambulatory Visit: Payer: Self-pay

## 2018-08-11 ENCOUNTER — Emergency Department (HOSPITAL_COMMUNITY): Payer: Medicare Other

## 2018-08-11 ENCOUNTER — Ambulatory Visit
Admission: RE | Admit: 2018-08-11 | Discharge: 2018-08-11 | Disposition: A | Discharge status: Home / Self Care | Payer: Medicare Other | Source: Ambulatory Visit | Attending: Cardiovascular Disease | Admitting: Cardiovascular Disease

## 2018-08-11 ENCOUNTER — Encounter: Payer: Self-pay | Admitting: Nurse Practitioner

## 2018-08-11 ENCOUNTER — Ambulatory Visit: Payer: Medicare Other | Admitting: Cardiovascular Disease

## 2018-08-11 VITALS — BP 140/80 | HR 66 | Ht 64.0 in | Wt 216.8 lb

## 2018-08-11 DIAGNOSIS — I2510 Atherosclerotic heart disease of native coronary artery without angina pectoris: Secondary | ICD-10-CM

## 2018-08-11 DIAGNOSIS — R001 Bradycardia, unspecified: Secondary | ICD-10-CM | POA: Diagnosis not present

## 2018-08-11 DIAGNOSIS — R079 Chest pain, unspecified: Secondary | ICD-10-CM | POA: Diagnosis not present

## 2018-08-11 DIAGNOSIS — M170 Bilateral primary osteoarthritis of knee: Secondary | ICD-10-CM | POA: Diagnosis present

## 2018-08-11 DIAGNOSIS — E785 Hyperlipidemia, unspecified: Secondary | ICD-10-CM | POA: Diagnosis present

## 2018-08-11 DIAGNOSIS — I5032 Chronic diastolic (congestive) heart failure: Secondary | ICD-10-CM | POA: Diagnosis present

## 2018-08-11 DIAGNOSIS — Z951 Presence of aortocoronary bypass graft: Secondary | ICD-10-CM

## 2018-08-11 DIAGNOSIS — Z7984 Long term (current) use of oral hypoglycemic drugs: Secondary | ICD-10-CM

## 2018-08-11 DIAGNOSIS — R0601 Orthopnea: Secondary | ICD-10-CM | POA: Diagnosis present

## 2018-08-11 DIAGNOSIS — Z79890 Hormone replacement therapy (postmenopausal): Secondary | ICD-10-CM | POA: Diagnosis not present

## 2018-08-11 DIAGNOSIS — E1140 Type 2 diabetes mellitus with diabetic neuropathy, unspecified: Secondary | ICD-10-CM | POA: Diagnosis present

## 2018-08-11 DIAGNOSIS — R072 Precordial pain: Secondary | ICD-10-CM | POA: Diagnosis not present

## 2018-08-11 DIAGNOSIS — I440 Atrioventricular block, first degree: Secondary | ICD-10-CM | POA: Diagnosis present

## 2018-08-11 DIAGNOSIS — R4182 Altered mental status, unspecified: Secondary | ICD-10-CM | POA: Diagnosis not present

## 2018-08-11 DIAGNOSIS — R0602 Shortness of breath: Secondary | ICD-10-CM

## 2018-08-11 DIAGNOSIS — I110 Hypertensive heart disease with heart failure: Secondary | ICD-10-CM | POA: Diagnosis present

## 2018-08-11 DIAGNOSIS — I5033 Acute on chronic diastolic (congestive) heart failure: Secondary | ICD-10-CM | POA: Diagnosis not present

## 2018-08-11 DIAGNOSIS — Z23 Encounter for immunization: Secondary | ICD-10-CM

## 2018-08-11 DIAGNOSIS — Z794 Long term (current) use of insulin: Secondary | ICD-10-CM

## 2018-08-11 DIAGNOSIS — Z9889 Other specified postprocedural states: Secondary | ICD-10-CM

## 2018-08-11 DIAGNOSIS — I4510 Unspecified right bundle-branch block: Secondary | ICD-10-CM | POA: Diagnosis present

## 2018-08-11 DIAGNOSIS — R0789 Other chest pain: Secondary | ICD-10-CM | POA: Diagnosis present

## 2018-08-11 DIAGNOSIS — Z7902 Long term (current) use of antithrombotics/antiplatelets: Secondary | ICD-10-CM | POA: Diagnosis not present

## 2018-08-11 DIAGNOSIS — Z800 Family history of malignant neoplasm of digestive organs: Secondary | ICD-10-CM

## 2018-08-11 DIAGNOSIS — Z823 Family history of stroke: Secondary | ICD-10-CM

## 2018-08-11 DIAGNOSIS — I959 Hypotension, unspecified: Secondary | ICD-10-CM | POA: Diagnosis not present

## 2018-08-11 DIAGNOSIS — I2582 Chronic total occlusion of coronary artery: Secondary | ICD-10-CM | POA: Diagnosis present

## 2018-08-11 DIAGNOSIS — I200 Unstable angina: Secondary | ICD-10-CM | POA: Diagnosis not present

## 2018-08-11 DIAGNOSIS — E039 Hypothyroidism, unspecified: Secondary | ICD-10-CM | POA: Diagnosis present

## 2018-08-11 HISTORY — DX: Other specified postprocedural states: Z98.890

## 2018-08-11 LAB — CBC
HCT: 43 % (ref 36.0–46.0)
HCT: 42.2 % (ref 36.0–46.0)
HEMOGLOBIN: 14.3 g/dL (ref 12.0–15.0)
HEMOGLOBIN: 13.9 g/dL (ref 12.0–15.0)
MCH: 30.6 pg (ref 26.0–34.0)
MCH: 30 pg (ref 26.0–34.0)
MCHC: 33.3 g/dL (ref 30.0–36.0)
MCHC: 32.9 g/dL (ref 30.0–36.0)
MCV: 92.1 fL (ref 78.0–100.0)
MCV: 91.1 fL (ref 78.0–100.0)
PLATELETS: 228 10*3/uL (ref 150–400)
PLATELETS: 212 10*3/uL (ref 150–400)
RBC: 4.67 MIL/uL (ref 3.87–5.11)
RBC: 4.63 MIL/uL (ref 3.87–5.11)
RDW: 13.5 % (ref 11.5–15.5)
RDW: 13.3 % (ref 11.5–15.5)
WBC: 12.2 10*3/uL — ABNORMAL HIGH (ref 4.0–10.5)
WBC: 11.2 10*3/uL — ABNORMAL HIGH (ref 4.0–10.5)

## 2018-08-11 LAB — CBC WITH DIFFERENTIAL/PLATELET
BASOS ABS: 0 10*3/uL (ref 0.0–0.2)
BASOS: 0 %
EOS (ABSOLUTE): 0.1 10*3/uL (ref 0.0–0.4)
Eos: 1 %
HEMOGLOBIN: 14.4 g/dL (ref 11.1–15.9)
Hematocrit: 41.3 % (ref 34.0–46.6)
LYMPHS: 26 %
Lymphocytes Absolute: 2.7 10*3/uL (ref 0.7–3.1)
MCH: 30.6 pg (ref 26.6–33.0)
MCHC: 34.9 g/dL (ref 31.5–35.7)
MCV: 88 fL (ref 79–97)
MONOS ABS: 0.8 10*3/uL (ref 0.1–0.9)
Monocytes: 7 %
NEUTROS PCT: 66 %
Neutrophils Absolute: 7.1 10*3/uL — ABNORMAL HIGH (ref 1.4–7.0)
Platelets: 213 10*3/uL (ref 150–450)
RBC: 4.71 x10E6/uL (ref 3.77–5.28)
RDW: 14.2 % (ref 12.3–15.4)
WBC: 10.7 10*3/uL (ref 3.4–10.8)

## 2018-08-11 LAB — HEPATIC FUNCTION PANEL
ALT: 30 U/L (ref 0–44)
AST: 31 U/L (ref 15–41)
Albumin: 3.9 g/dL (ref 3.5–5.0)
Alkaline Phosphatase: 67 U/L (ref 38–126)
BILIRUBIN DIRECT: 0.1 mg/dL (ref 0.0–0.2)
Indirect Bilirubin: 0.7 mg/dL (ref 0.3–0.9)
Total Bilirubin: 0.8 mg/dL (ref 0.3–1.2)
Total Protein: 6.3 g/dL — ABNORMAL LOW (ref 6.5–8.1)

## 2018-08-11 LAB — TROPONIN I: Troponin I: 0.26 ng/mL (ref <0.03)

## 2018-08-11 LAB — BASIC METABOLIC PANEL
ANION GAP: 18 — AB (ref 5–15)
BUN: 21 mg/dL (ref 8–23)
CO2: 17 mmol/L — ABNORMAL LOW (ref 22–32)
Calcium: 9.6 mg/dL (ref 8.9–10.3)
Chloride: 101 mmol/L (ref 98–111)
Creatinine, Ser: 1.39 mg/dL — ABNORMAL HIGH (ref 0.44–1.00)
GFR calc Af Amer: 40 mL/min — ABNORMAL LOW (ref >60)
GFR calc non Af Amer: 34 mL/min — ABNORMAL LOW (ref >60)
GLUCOSE: 222 mg/dL — AB (ref 70–99)
Potassium: 3.7 mmol/L (ref 3.5–5.1)
Sodium: 136 mmol/L (ref 135–145)

## 2018-08-11 LAB — PROTIME-INR
INR: 1.01
PROTHROMBIN TIME: 13.2 s (ref 11.4–15.2)

## 2018-08-11 LAB — MRSA PCR SCREENING: MRSA by PCR: NEGATIVE

## 2018-08-11 LAB — I-STAT TROPONIN, ED: TROPONIN I, POC: 0.11 ng/mL — AB (ref 0.00–0.08)

## 2018-08-11 LAB — TROPONIN T: Troponin T TROPT: 0.068 ng/mL (ref <0.011)

## 2018-08-11 LAB — TSH: TSH: 3.277 u[IU]/mL (ref 0.350–4.500)

## 2018-08-11 MED ORDER — CLOPIDOGREL BISULFATE 75 MG PO TABS
75.0000 mg | ORAL_TABLET | Freq: Every day | ORAL | Status: DC
  Administered 2018-08-12 – 2018-08-14 (×3): 75 mg via ORAL
  Filled 2018-08-11 (×3): qty 1

## 2018-08-11 MED ORDER — ASPIRIN 81 MG PO CHEW
324.0000 mg | CHEWABLE_TABLET | Freq: Once | ORAL | Status: AC
  Administered 2018-08-11: 324 mg via ORAL
  Filled 2018-08-11: qty 4

## 2018-08-11 MED ORDER — BENAZEPRIL HCL 20 MG PO TABS
40.0000 mg | ORAL_TABLET | Freq: Every day | ORAL | Status: DC
  Administered 2018-08-12 – 2018-08-14 (×3): 40 mg via ORAL
  Filled 2018-08-11: qty 1
  Filled 2018-08-11 (×2): qty 2
  Filled 2018-08-11: qty 1
  Filled 2018-08-11: qty 2

## 2018-08-11 MED ORDER — ISOSORBIDE MONONITRATE ER 30 MG PO TB24: 30.0000 mg | ORAL_TABLET | Freq: Every day | ORAL | Status: DC

## 2018-08-11 MED ORDER — METOPROLOL SUCCINATE ER 25 MG PO TB24
25.0000 mg | ORAL_TABLET | Freq: Every day | ORAL | Status: DC
  Administered 2018-08-12 – 2018-08-14 (×3): 25 mg via ORAL
  Filled 2018-08-11 (×3): qty 1

## 2018-08-11 MED ORDER — CLOPIDOGREL BISULFATE 75 MG PO TABS: 75.0000 mg | ORAL_TABLET | Freq: Every day | ORAL | Status: DC

## 2018-08-11 MED ORDER — ISOSORBIDE MONONITRATE ER 30 MG PO TB24
30.0000 mg | ORAL_TABLET | Freq: Every day | ORAL | Status: DC
  Administered 2018-08-12 – 2018-08-14 (×3): 30 mg via ORAL
  Filled 2018-08-11 (×3): qty 1

## 2018-08-11 MED ORDER — CARBIDOPA-LEVODOPA 25-100 MG PO TABS
1.0000 | ORAL_TABLET | Freq: Every day | ORAL | Status: DC
  Administered 2018-08-11 – 2018-08-13 (×3): 1 via ORAL
  Filled 2018-08-11 (×3): qty 1

## 2018-08-11 MED ORDER — INFLUENZA VAC SPLIT HIGH-DOSE 0.5 ML IM SUSY
0.5000 mL | PREFILLED_SYRINGE | INTRAMUSCULAR | Status: AC
  Administered 2018-08-14: 0.5 mL via INTRAMUSCULAR
  Filled 2018-08-11: qty 0.5

## 2018-08-11 MED ORDER — GABAPENTIN 100 MG PO CAPS
200.0000 mg | ORAL_CAPSULE | Freq: Every day | ORAL | Status: DC
  Administered 2018-08-11 – 2018-08-13 (×3): 200 mg via ORAL
  Filled 2018-08-11 (×3): qty 2

## 2018-08-11 MED ORDER — TIZANIDINE HCL 4 MG PO TABS
2.0000 mg | ORAL_TABLET | Freq: Every day | ORAL | Status: DC
  Administered 2018-08-11 – 2018-08-13 (×3): 2 mg via ORAL
  Filled 2018-08-11 (×3): qty 1

## 2018-08-11 MED ORDER — ASPIRIN EC 81 MG PO TBEC
81.0000 mg | DELAYED_RELEASE_TABLET | Freq: Every day | ORAL | Status: DC
  Administered 2018-08-14: 81 mg via ORAL
  Filled 2018-08-11 (×2): qty 1

## 2018-08-11 MED ORDER — FUROSEMIDE 20 MG PO TABS: 20.0000 mg | ORAL_TABLET | Freq: Three times a day (TID) | ORAL | Status: DC

## 2018-08-11 MED ORDER — GABAPENTIN 300 MG PO CAPS: 300.0000 mg | ORAL_CAPSULE | Freq: Every day | ORAL | Status: DC

## 2018-08-11 MED ORDER — HYDRALAZINE HCL 50 MG PO TABS
25.0000 mg | ORAL_TABLET | Freq: Three times a day (TID) | ORAL | Status: DC
  Administered 2018-08-11 – 2018-08-14 (×8): 25 mg via ORAL
  Filled 2018-08-11 (×8): qty 1

## 2018-08-11 MED ORDER — ISOSORBIDE MONONITRATE ER 30 MG PO TB24: 30.0000 mg | ORAL_TABLET | Freq: Every day | ORAL | 3 refills | Status: DC

## 2018-08-11 MED ORDER — AMLODIPINE BESYLATE 5 MG PO TABS
5.0000 mg | ORAL_TABLET | Freq: Every day | ORAL | Status: DC
  Administered 2018-08-12 – 2018-08-14 (×3): 5 mg via ORAL
  Filled 2018-08-11 (×3): qty 1

## 2018-08-11 MED ORDER — FUROSEMIDE 20 MG PO TABS
20.0000 mg | ORAL_TABLET | Freq: Every day | ORAL | Status: DC
  Administered 2018-08-11 – 2018-08-12 (×2): 20 mg via ORAL
  Filled 2018-08-11 (×2): qty 1

## 2018-08-11 MED ORDER — FUROSEMIDE 40 MG PO TABS
40.0000 mg | ORAL_TABLET | Freq: Every day | ORAL | Status: DC
  Administered 2018-08-12: 40 mg via ORAL
  Filled 2018-08-11: qty 1

## 2018-08-11 MED ORDER — NITROGLYCERIN 0.4 MG SL SUBL
0.4000 mg | SUBLINGUAL_TABLET | SUBLINGUAL | Status: DC | PRN
  Administered 2018-08-13: 0.4 mg via SUBLINGUAL

## 2018-08-11 MED ORDER — TIZANIDINE HCL 2 MG PO CAPS: 2.0000 mg | ORAL_CAPSULE | Freq: Every day | ORAL | Status: DC

## 2018-08-11 MED ORDER — GLIMEPIRIDE 1 MG PO TABS
1.0000 mg | ORAL_TABLET | Freq: Every day | ORAL | Status: DC
  Administered 2018-08-12 – 2018-08-14 (×2): 1 mg via ORAL
  Filled 2018-08-11 (×3): qty 1

## 2018-08-11 MED ORDER — AMLODIPINE BESYLATE 5 MG PO TABS: 5.0000 mg | ORAL_TABLET | Freq: Every day | ORAL | Status: DC

## 2018-08-11 MED ORDER — BENAZEPRIL HCL 40 MG PO TABS: 40.0000 mg | ORAL_TABLET | Freq: Every day | ORAL | Status: DC

## 2018-08-11 MED ORDER — LEVOTHYROXINE SODIUM 100 MCG PO TABS
100.0000 ug | ORAL_TABLET | Freq: Every day | ORAL | Status: DC
  Administered 2018-08-12 – 2018-08-14 (×3): 100 ug via ORAL
  Filled 2018-08-11 (×3): qty 1

## 2018-08-11 MED ORDER — POTASSIUM CHLORIDE CRYS ER 20 MEQ PO TBCR
20.0000 meq | EXTENDED_RELEASE_TABLET | Freq: Two times a day (BID) | ORAL | Status: DC
  Administered 2018-08-11 – 2018-08-14 (×6): 20 meq via ORAL
  Filled 2018-08-11 (×6): qty 1

## 2018-08-11 MED ORDER — HEPARIN (PORCINE) IN NACL 100-0.45 UNIT/ML-% IJ SOLN
1100.0000 [IU]/h | INTRAMUSCULAR | Status: DC
  Administered 2018-08-11: 900 [IU]/h via INTRAVENOUS
  Administered 2018-08-12: 1100 [IU]/h via INTRAVENOUS
  Filled 2018-08-11 (×3): qty 250

## 2018-08-11 NOTE — ED Triage Notes (Signed)
Pt reports intermittent left sided cp that started today with sob, N/Vx2. Pt came from doctors office for elevated troponin. Pt took two nitro at home with some relief. Pt denies pain at this time. Pt takes Coumadin for hx of stroke. Hx of cardiac cath. Pt is HOH.

## 2018-08-11 NOTE — Progress Notes (Signed)
CRITICAL VALUE ALERT  Critical Value: Troponin 0.26  Date & Time Notied:  08/11/18 at 2220   Provider Notified: Vedia Coffer, MD  Orders Received/Actions taken: EKG, aspirin

## 2018-08-11 NOTE — Progress Notes (Signed)
Cardiology Office Note   Date:  08/11/2018   ID:  LASONYA OHAIRE, DOB 10-Oct-1934, MRN [id number]  PCP:  [name], MD  Cardiologist: Cassell Clement MD  --> Jaiana Sheffer   Chief Complaint  Patient presents with  . Coronary Artery Disease  . Shortness of Breath     Problem list 1. Coronary artery disease-status post coronary artery bypass grafting 2. Essential hypertension 3.  Chronic diastolic congestive heart failure  RYKIA FINNIN is a 84 y.o. female who presents for a six-month office visit This pleasant 84 year old woman is seen for a followup office visit. Her PCP is Dr. [name]. The patient has a history of known ischemic heart disease. She had successful CABG 16 years ago by Dr. [name]. She has not been experiencing any subsequent chest pain or angina. She notes occasional shortness of breath.  Her last echocardiogram was 03/25/14 and showed an ejection fraction of 65-70% with grade 1 diastolic dysfunction.  She has a history of hypertension and she has a past history of mild fluid retention responding to Lasix. She has intermittent ankle edema. She has occasional mild dizzy spells but no syncope. She has not been experiencing any palpitations or tachycardia. She is relatively sedentary because of significant osteoarthritis of her knees. She arrived in our office today by wheelchair. She sleeps with one pillow under her head and also a pillow under her feet to help with her mild dependent edema. During the day she wears compression hose. She does not have any history of TIA or stroke. She does not have any history of peptic ulcer disease or GI bleeding. She has a history of intolerance to cholesterol lowering drugs including statins, ezetimibe, and Welchol. Family history is positive for ischemic heart disease. Her father died at 105 and a brother died at 24 of heart attacks.  At her last visit we decreased her beta blocker because of bradycardia and we increased her  hydralazine because of systolic hypertension. Since then her blood pressure has been satisfactory. The patient has not been having any chest pain.  She has had no recurrence of the paroxysmal atrial flutter fibrillation which occurred in August 2016 immediately postoperative. she does get short of breath with minimal housework activities.  Her peripheral edema has resolved.  EKG today shows normal sinus rhythm.  Apr 16, 2016:  Pt is seen today .   Recent transfer from Dr. [name].  Has hx of CAD, chronic diastolic CHF,  Essential HTN. No cardiac issues. Is in a wheelchair ,   Uses a walker at home  Avoids salt .   10/04/2016:   Tosca is seen today for follow-up visit. Is very short of breath today since her hospitalization one month ago. She has been in the hospital twice since I last saw her.   She was admitted in October and was seen by Dr. Antoine Poche  on October 1. She had presented with episodes of chest pain. Troponin levels were negative. He recommended an outpatient Lexiscan  Myoview study.  Has not had the stress myoview yet   Aug. 14, 2018:  [place] is here to follow-up for her  CAD and shortness of breath.  She had a stress Myoview study in November, 2017 which was normal. She has no ischemia.  Left ventricular ejection fraction 60%.  August 11, 2018: Ms. Carrio is seen as a work in visit .  She has been having worsening dyspnea.  More shortness of breath recently  Has a dry  cough  Has not seen her primary MD recently Has a headache. Is dizzy.    Has a choking sensation when she lies down.   Had CP on one occasion when she was choking SL NTG helped  This was 6 weeks ago  Has had a chronic headache for weeks .  These symptoms do not feel like her symptoms prior to her CABG .  Had a false positive myoview in 2004.      Past Medical History:  Diagnosis Date  . Arthritis   . Coronary artery disease 1999   CABG  . Depression   . Diabetes mellitus without  complication (HCC)    type II; metformin and Amaryl  . Diabetic neuropathy (HCC)   . History of anemia as a child   . HOH (hard of hearing)   . Hypertension   . Hypothyroidism    synthroid  . PONV (postoperative nausea and vomiting)   . Restless leg syndrome   . Shortness of breath 09/03/12   ECHO- The LA is Moderately Dilated, mild mitral annular calcification. mild pulmonary hypertension, moderate concentric LV hypertrophy. Atrial septum is aneurysmal. Aortic valve appears to be mildly sclerotic.EF 88%  . Urinary incontinence   . Weakness of both legs     Past Surgical History:  Procedure Laterality Date  . ABDOMINAL HYSTERECTOMY    . APPENDECTOMY    . BACK SURGERY    . CARDIAC CATHERIZATION  08/20/03   Widely patent bypass grafts. Normal left ventricular systolic function.  Marland Kitchen CARDIAC CATHETERIZATION    . CHOLECYSTECTOMY    . COLONOSCOPY    . CORONARY ARTERY BYPASS GRAFT    . EYE SURGERY     bilateral cataracts  . LUMBAR LAMINECTOMY/DECOMPRESSION MICRODISCECTOMY N/A 07/26/2015   Procedure: BILATERAL Lumbar 3-4 SUBTOTAL HEMILAMINECTOMY WITH LATERAL RECESS DECOMPRESSION;  Surgeon: Kerrin Champagne, MD;  Location: [facility] OR;  Service: Orthopedics;  Laterality: N/A;  . SHOULDER SURGERY    . TUBAL LIGATION       Current Outpatient Medications  Medication Sig Dispense Refill  . amLODipine (NORVASC) 5 MG tablet TAKE 1 TABLET BY MOUTH ONCE DAILY 90 tablet 2  . Ascorbic Acid (VITAMIN C ER PO) Take 1,000 mg by mouth daily.     . benazepril (LOTENSIN) 40 MG tablet TAKE 1 TABLET BY MOUTH ONCE DAILY 90 tablet 2  . Calcium Carb-Cholecalciferol (CALCIUM 600/VITAMIN D3) 600-800 MG-UNIT TABS Take 1 tablet by mouth 2 (two) times daily.    . carbidopa-levodopa (SINEMET IR) 25-100 MG tablet Take 1 tablet by mouth at bedtime. 90 tablet 3  . clopidogrel (PLAVIX) 75 MG tablet Take 1 tablet (75 mg total) by mouth daily. 30 tablet 3  . diazepam (VALIUM) 10 MG tablet Take 10 mg by mouth every 6 (six)  hours as needed for anxiety.    . gabapentin (NEURONTIN) 300 MG capsule Take 1 capsule (300 mg total) by mouth at bedtime. 90 capsule 3  . Garlic 1000 MG CAPS Take 1,000 mg by mouth daily.     Marland Kitchen glimepiride (AMARYL) 4 MG tablet Take 1 mg by mouth daily with breakfast.     . hydrALAZINE (APRESOLINE) 25 MG tablet TAKE 1 TABLET BY MOUTH THREE TIMES DAILY 270 tablet 2  . insulin glargine (LANTUS) 100 UNIT/ML injection Inject 20 Units into the skin daily.    Marland Kitchen levothyroxine (SYNTHROID, LEVOTHROID) 112 MCG tablet Take 100 mcg by mouth daily before breakfast.     . metFORMIN (GLUCOPHAGE) 500 MG tablet Take  500 mg by mouth 4 (four) times daily.     . metoprolol succinate (TOPROL-XL) 25 MG 24 hr tablet TAKE 1 TABLET BY MOUTH ONCE DAILY 90 tablet 2  . nitroGLYCERIN (NITROSTAT) 0.4 MG SL tablet Place 1 tablet (0.4 mg total) under the tongue every 5 (five) minutes as needed for chest pain. If need to take more than 3 times (every 5 minutes), please call you doctor. 20 tablet 0  . potassium chloride SA (K-DUR,KLOR-CON) 20 MEQ tablet Take 1 tablet (20 mEq total) by mouth 2 (two) times daily. 180 tablet 3  . tizanidine (ZANAFLEX) 2 MG capsule Take 1 capsule (2 mg total) by mouth at bedtime. 30 capsule 5  . vitamin B-12 (CYANOCOBALAMIN) 1000 MCG tablet Take 1,000 mcg by mouth daily.    . furosemide (LASIX) 20 MG tablet Take 1 tablet (20 mg total) by mouth 3 (three) times daily. 180 tablet 4   No current facility-administered medications for this visit.     Allergies:   Statins; Amitriptyline; Benadryl [diphenhydramine hcl (sleep)]; Tylenol [acetaminophen]; and Welchol [colesevelam hcl]    Social History:  The patient  reports that she has never smoked. She has never used smokeless tobacco. She reports that she does not drink alcohol or use drugs.   Family History:  The patient's family history includes Healthy in her daughter; Heart attack in her father; Heart attack (age of onset: 62) in her brother; Liver  cancer in her sister; Other in her mother; Pneumonia in her mother; Stroke in her sister; Stroke (age of onset: 70) in her son.    ROS:  Please see the history of present illness.   Otherwise, review of systems are positive for none.   All other systems are reviewed and negative.    Physical Exam: Blood pressure 140/80, pulse 66, height 5\' 4"  (1.626 m), weight 216 lb 12.8 oz (98.3 kg), SpO2 96 %.  GEN: Elderly female, examined in a wheelchair.  She is panting .   O2 sats are 96%.  HEENT: Normal NECK: No JVD; No carotid bruits LYMPHATICS: No lymphadenopathy CARDIAC: RR ,   RESPIRATORY:  Clear to auscultation without rales, wheezing or rhonchi  ABDOMEN: Soft, non-tender, non-distended  MUSCULOSKELETAL:  No edema; No deformity  SKIN: Warm and dry NEUROLOGIC:  Alert and oriented x 3    EKG:    NSR at 66.  1st degree AV block  RBB , Inf. MI ( old)    No changes from previous ECG   Recent Labs: No results found for requested labs within last 8760 hours.    Lipid Panel    Component Value Date/Time   CHOL 151 07/15/2016 0343   TRIG 106 07/15/2016 0343   HDL 39 (L) 07/15/2016 0343   CHOLHDL 3.9 07/15/2016 0343   VLDL 21 07/15/2016 0343   LDLCALC 91 07/15/2016 0343      Wt Readings from Last 3 Encounters:  08/11/18 216 lb 12.8 oz (98.3 kg)  04/01/18 219 lb (99.3 kg)  12/09/17 209 lb (94.8 kg)         ASSESSMENT AND PLAN:  1.  Dyspnea / cough / chest pain :  She has been having severe DOE, orthopnea, cough for the past 5-6 weeks.  Is panting . Lungs are clear .  Symptoms are not similar to her pre- CABG symptoms but this is concerning  We will check basic metabolic throat profile, CBC, TSH, liver enzymes, lipid profile.  Also check a troponin level. [facility]  I like to get a repeat echocardiogram for further evaluation of her LV function.  We will also get a Lexiscan Myoview study. Add Imdur 30 mg a day   She has an appt to see me in 6  weeks .   Will keep that appt.  She will call sooner if needed.   2. ischemic heart disease status post CABG:. CABG in 1999.   No cp but she is having some shortness of breath .   3. chronic diastolic CHF :  Repeating echo    4. hypertensive cardiovascular disease -  BP is a bit elevated.   Adding Imdur   5. right bundle branch block, old   6.  Paroxysmal atrial flutter fibrillation, isolated incident which occurred in the postoperative setting and has not recurred, currently maintaining normal sinus rhythm with first degree AV block.  Not on long-term anticoagulation   7. sedentary lifestyle secondary to osteoarthritis of knees. She has seen orthopedic surgery but no discussion of surgical intervention was made.      Kristeen Miss, MD  08/11/2018 12:28 PM    [facility] [address] [place],  [address] [place], [place]  [address] Pager [phone] Phone: [phone]; Fax: [phone]    Addendum.  Troponin level returned at 0.068.   The patient was having ongoing chest pain at the time when we called with her lab result.  We encouraged her to call EMS.  Her symptoms are worrisome for acute coronary syndrome. She will need to be started on heparin and IV nitroglycerin.  I anticipate doing a heart catheterization tomorrow.    Kristeen Miss, MD  08/11/2018 3:07 PM    [facility] [address] [place],  [address] [place], [place]  [address] Pager [phone] Phone: [phone]; Fax: [phone]

## 2018-08-11 NOTE — Telephone Encounter (Signed)
Patient is scheduled to see Dr. Elease Hashimoto today at 11:40

## 2018-08-11 NOTE — Progress Notes (Signed)
ANTICOAGULATION CONSULT NOTE - Initial Consult   Pharmacy Consult for heparin Indication: chest pain/ACS  Allergies  Allergen Reactions  . Statins Other (See Comments)    Causes myalgias  . Amitriptyline Other (See Comments)    Pt. States that it caused it to be suicidal.   . Benadryl [Diphenhydramine Hcl (Sleep)] Other (See Comments)    Makes the patient very "hyper"  . Tylenol [Acetaminophen] Nausea Only  . Welchol [Colesevelam Hcl] Other (See Comments)    Causes dizziness    Patient Measurements: Height: 5\' 4"  (162.6 cm) Weight: 214 lb 8.1 oz (97.3 kg) IBW/kg (Calculated) : 54.7 Heparin Dosing Weight: 77 kg  Vital Signs: Temp: 97.7 F (36.5 C) (09/09 1942) Temp Source: Oral (09/09 1942) BP: 151/96 (09/09 1942) Pulse Rate: 89 (09/09 1942)  Labs: Recent Labs    08/11/18 1254 08/11/18 1715  HGB 14.4 14.3  HCT 41.3 43.0  PLT 213 228  LABPROT  --  13.2  INR  --  1.01  CREATININE  --  1.39*    Estimated Creatinine Clearance: 35.3 mL/min (A) (by C-G formula based on SCr of 1.39 mg/dL (H)).   Medical History: Past Medical History:  Diagnosis Date  . Arthritis   . Coronary artery disease 1999   CABG  . Depression   . Diabetes mellitus without complication (HCC)    type II; metformin and Amaryl  . Diabetic neuropathy (HCC)   . History of anemia as a child   . HOH (hard of hearing)   . Hypertension   . Hypothyroidism    synthroid  . PONV (postoperative nausea and vomiting)   . Restless leg syndrome   . Shortness of breath 09/03/12   ECHO- The LA is Moderately Dilated, mild mitral annular calcification. mild pulmonary hypertension, moderate concentric LV hypertrophy. Atrial septum is aneurysmal. Aortic valve appears to be mildly sclerotic.EF 88%  . Urinary incontinence   . Weakness of both legs     Medications:  Infusions:    Assessment: 84 yo female admitted with chest pain, elevated troponin.  Pharmacy asked to start IV heparin.  Likely going to  cath lab tomorrow.  Baseline CBC WNL.  No anticoagulants PTA.  Goal of Therapy:  Heparin level 0.3-0.7 units/ml Monitor platelets by anticoagulation protocol: Yes   Plan:  1. Start IV heparin with bolus of 3500 units x 1. 2. Then start heparin gtt at 900 units/hr. 3. Check heparin level in 8 hrs. 4. Daily heparin level and CBC. 5. F/u plans for cath.  Jenetta Downer, [facility] Clinical Pharmacist Phone [phone]  08/11/2018 9:56 PM

## 2018-08-11 NOTE — H&P (Signed)
Cardiology Office Note   Date:  08/11/2018   ID:  TAWONDA LEGASPI, DOB 1934/06/24, MRN [id number]  PCP:  [name], MD     Cardiologist: Cassell Clement MD  --> Sierra Wright      Chief Complaint  Patient presents with  . Coronary Artery Disease  . Shortness of Breath     Problem list 1. Coronary artery disease-status post coronary artery bypass grafting 2. Essential hypertension 3.  Chronic diastolic congestive heart failure  Sierra MATSUO is a 84 y.o. female who presents for a six-month office visit This pleasant 84 year old woman is seen for a followup office visit. Her PCP is Dr. [name]. The patient has a history of known ischemic heart disease. She had successful CABG 16 years ago by Dr. [name]. She has not been experiencing any subsequent chest pain or angina. She notes occasional shortness of breath.  Her last echocardiogram was 03/25/14 and showed an ejection fraction of 65-70% with grade 1 diastolic dysfunction.  She has a history of hypertension and she has a past history of mild fluid retention responding to Lasix. She has intermittent ankle edema. She has occasional mild dizzy spells but no syncope. She has not been experiencing any palpitations or tachycardia. She is relatively sedentary because of significant osteoarthritis of her knees. She arrived in our office today by wheelchair. She sleeps with one pillow under her head and also a pillow under her feet to help with her mild dependent edema. During the day she wears compression hose. She does not have any history of TIA or stroke. She does not have any history of peptic ulcer disease or GI bleeding. She has a history of intolerance to cholesterol lowering drugs including statins, ezetimibe, and Welchol. Family history is positive for ischemic heart disease. Her father died at 33 and a brother died at 62 of heart attacks.  At her last visit we decreased her beta blocker because of bradycardia and we  increased her hydralazine because of systolic hypertension. Since then her blood pressure has been satisfactory. The patient has not been having any chest pain.  She has had no recurrence of the paroxysmal atrial flutter fibrillation which occurred in August 2016 immediately postoperative. she does get short of breath with minimal housework activities.  Her peripheral edema has resolved.  EKG today shows normal sinus rhythm.  Apr 16, 2016:  Pt is seen today .   Recent transfer from Dr. [name].  Has hx of CAD, chronic diastolic CHF,  Essential HTN. No cardiac issues. Is in a wheelchair ,   Uses a walker at home  Avoids salt .   10/04/2016:   Sierra Wright is seen today for follow-up visit. Is very short of breath today since her hospitalization one month ago. She has been in the hospital twice since I last saw her.   She was admitted in October and was seen by Dr. Antoine Poche  on October 1. She had presented with episodes of chest pain. Troponin levels were negative. He recommended an outpatient Lexiscan  Myoview study.  Has not had the stress myoview yet   Aug. 14, 2018:  Sierra Wright is here to follow-up for her  CAD and shortness of breath.  She had a stress Myoview study in November, 2017 which was normal. She has no ischemia.  Left ventricular ejection fraction 60%.  August 11, 2018: Sierra Wright is seen as a work in visit .  She has been having worsening dyspnea.  More shortness of  breath recently  Has a dry cough  Has not seen her primary MD recently Has a headache. Is dizzy.    Has a choking sensation when she lies down.   Had CP on one occasion when she was choking SL NTG helped  This was 6 weeks ago  Has had a chronic headache for weeks .  These symptoms do not feel like her symptoms prior to her CABG .  Had a false positive myoview in 2004.          Past Medical History:  Diagnosis Date  . Arthritis   . Coronary artery disease 1999   CABG  . Depression     . Diabetes mellitus without complication (HCC)    type II; metformin and Amaryl  . Diabetic neuropathy (HCC)   . History of anemia as a child   . HOH (hard of hearing)   . Hypertension   . Hypothyroidism    synthroid  . PONV (postoperative nausea and vomiting)   . Restless leg syndrome   . Shortness of breath 09/03/12   ECHO- The LA is Moderately Dilated, mild mitral annular calcification. mild pulmonary hypertension, moderate concentric LV hypertrophy. Atrial septum is aneurysmal. Aortic valve appears to be mildly sclerotic.EF 88%  . Urinary incontinence   . Weakness of both legs          Past Surgical History:  Procedure Laterality Date  . ABDOMINAL HYSTERECTOMY    . APPENDECTOMY    . BACK SURGERY    . CARDIAC CATHERIZATION  08/20/03   Widely patent bypass grafts. Normal left ventricular systolic function.  Sierra Wright CARDIAC CATHETERIZATION    . CHOLECYSTECTOMY    . COLONOSCOPY    . CORONARY ARTERY BYPASS GRAFT    . EYE SURGERY     bilateral cataracts  . LUMBAR LAMINECTOMY/DECOMPRESSION MICRODISCECTOMY N/A 07/26/2015   Procedure: BILATERAL Lumbar 3-4 SUBTOTAL HEMILAMINECTOMY WITH LATERAL RECESS DECOMPRESSION;  Surgeon: Kerrin Champagne, MD;  Location: [facility] OR;  Service: Orthopedics;  Laterality: N/A;  . SHOULDER SURGERY    . TUBAL LIGATION             Current Outpatient Medications  Medication Sig Dispense Refill  . amLODipine (NORVASC) 5 MG tablet TAKE 1 TABLET BY MOUTH ONCE DAILY 90 tablet 2  . Ascorbic Acid (VITAMIN C ER PO) Take 1,000 mg by mouth daily.     . benazepril (LOTENSIN) 40 MG tablet TAKE 1 TABLET BY MOUTH ONCE DAILY 90 tablet 2  . Calcium Carb-Cholecalciferol (CALCIUM 600/VITAMIN D3) 600-800 MG-UNIT TABS Take 1 tablet by mouth 2 (two) times daily.    . carbidopa-levodopa (SINEMET IR) 25-100 MG tablet Take 1 tablet by mouth at bedtime. 90 tablet 3  . clopidogrel (PLAVIX) 75 MG tablet Take 1 tablet (75 mg total) by mouth  daily. 30 tablet 3  . diazepam (VALIUM) 10 MG tablet Take 10 mg by mouth every 6 (six) hours as needed for anxiety.    . gabapentin (NEURONTIN) 300 MG capsule Take 1 capsule (300 mg total) by mouth at bedtime. 90 capsule 3  . Garlic 1000 MG CAPS Take 1,000 mg by mouth daily.     Sierra Wright glimepiride (AMARYL) 4 MG tablet Take 1 mg by mouth daily with breakfast.     . hydrALAZINE (APRESOLINE) 25 MG tablet TAKE 1 TABLET BY MOUTH THREE TIMES DAILY 270 tablet 2  . insulin glargine (LANTUS) 100 UNIT/ML injection Inject 20 Units into the skin daily.    Sierra Wright levothyroxine (SYNTHROID, LEVOTHROID)  112 MCG tablet Take 100 mcg by mouth daily before breakfast.     . metFORMIN (GLUCOPHAGE) 500 MG tablet Take 500 mg by mouth 4 (four) times daily.     . metoprolol succinate (TOPROL-XL) 25 MG 24 hr tablet TAKE 1 TABLET BY MOUTH ONCE DAILY 90 tablet 2  . nitroGLYCERIN (NITROSTAT) 0.4 MG SL tablet Place 1 tablet (0.4 mg total) under the tongue every 5 (five) minutes as needed for chest pain. If need to take more than 3 times (every 5 minutes), please call you doctor. 20 tablet 0  . potassium chloride SA (K-DUR,KLOR-CON) 20 MEQ tablet Take 1 tablet (20 mEq total) by mouth 2 (two) times daily. 180 tablet 3  . tizanidine (ZANAFLEX) 2 MG capsule Take 1 capsule (2 mg total) by mouth at bedtime. 30 capsule 5  . vitamin B-12 (CYANOCOBALAMIN) 1000 MCG tablet Take 1,000 mcg by mouth daily.    . furosemide (LASIX) 20 MG tablet Take 1 tablet (20 mg total) by mouth 3 (three) times daily. 180 tablet 4   No current facility-administered medications for this visit.     Allergies:   Statins; Amitriptyline; Benadryl [diphenhydramine hcl (sleep)]; Tylenol [acetaminophen]; and Welchol [colesevelam hcl]    Social History:  The patient  reports that she has never smoked. She has never used smokeless tobacco. She reports that she does not drink alcohol or use drugs.   Family History:  The patient's family history includes  Healthy in her daughter; Heart attack in her father; Heart attack (age of onset: 71) in her brother; Liver cancer in her sister; Other in her mother; Pneumonia in her mother; Stroke in her sister; Stroke (age of onset: 61) in her son.    ROS:  Please see the history of present illness.   Otherwise, review of systems are positive for none.   All other systems are reviewed and negative.    Physical Exam: Blood pressure 140/80, pulse 66, height 5\' 4"  (1.626 m), weight 216 lb 12.8 oz (98.3 kg), SpO2 96 %.  GEN: Elderly female, examined in a wheelchair.  She is panting .   O2 sats are 96%.  HEENT: Normal NECK: No JVD; No carotid bruits LYMPHATICS: No lymphadenopathy CARDIAC: RR ,   RESPIRATORY:  Clear to auscultation without rales, wheezing or rhonchi  ABDOMEN: Soft, non-tender, non-distended  MUSCULOSKELETAL:  No edema; No deformity  SKIN: Warm and dry NEUROLOGIC:  Alert and oriented x 3    EKG:    NSR at 66.  1st degree AV block  RBB , Inf. MI ( old)    No changes from previous ECG   Recent Labs: No results found for requested labs within last 8760 hours.    Lipid Panel Labs (Brief)          Component Value Date/Time   CHOL 151 07/15/2016 0343   TRIG 106 07/15/2016 0343   HDL 39 (L) 07/15/2016 0343   CHOLHDL 3.9 07/15/2016 0343   VLDL 21 07/15/2016 0343   LDLCALC 91 07/15/2016 0343           Wt Readings from Last 3 Encounters:  08/11/18 216 lb 12.8 oz (98.3 kg)  04/01/18 219 lb (99.3 kg)  12/09/17 209 lb (94.8 kg)         ASSESSMENT AND PLAN:  1.  Dyspnea / cough / chest pain :  She has been having severe DOE, orthopnea, cough for the past 5-6 weeks.  Is panting . Lungs are clear .  Symptoms  are not similar to her pre- CABG symptoms but this is concerning  We will check basic metabolic throat profile, CBC, TSH, liver enzymes, lipid profile.  Also check a troponin level. [facility]  I like to get a repeat  echocardiogram for further evaluation of her LV function.  We will also get a Lexiscan Myoview study. Add Imdur 30 mg a day   She has an appt to see me in 6 weeks .   Will keep that appt.  She will call sooner if needed.   2. ischemic heart disease status post CABG:. CABG in 1999.   No cp but she is having some shortness of breath .   3. chronic diastolic CHF :  Repeating echo    4. hypertensive cardiovascular disease -  BP is a bit elevated.   Adding Imdur   5. right bundle branch block, old   6.  Paroxysmal atrial flutter fibrillation, isolated incident which occurred in the postoperative setting and has not recurred, currently maintaining normal sinus rhythm with first degree AV block.  Not on long-term anticoagulation   7. sedentary lifestyle secondary to osteoarthritis of knees. She has seen orthopedic surgery but no discussion of surgical intervention was made.      Kristeen Miss, MD  08/11/2018 12:28 PM    [facility] [address] [place],  [address] [place], [place]  [address] Pager [phone] Phone: [phone]; Fax: [phone]    Addendum.  Troponin level returned at 0.068.   The patient was having ongoing chest pain at the time when we called with her lab result.  We encouraged her to call EMS.  Her symptoms are worrisome for acute coronary syndrome. She will need to be started on heparin and IV nitroglycerin.  I anticipate doing a heart catheterization tomorrow.    Kristeen Miss, MD  08/11/2018 3:07 PM    [facility] [address] [place],  [address] [place], [place]  [address] Pager [phone] Phone: [phone]; Fax: [phone]

## 2018-08-11 NOTE — Telephone Encounter (Signed)
° °  Patient declined 9/10 appt with APP.    1. Are you currently SOB (can you hear that pt is SOB on the phone)? A "little" per patient   2. How long have you been experiencing SOB? month  3. Are you SOB when sitting or when up moving around? Sitting and worse when moving  4. Are you currently experiencing any other symptoms? Headache, chest pressure    Pt c/o of Chest Pain: STAT if CP now or developed within 24 hours  1. Are you having CP right now? No  2. Are you experiencing any other symptoms (ex. SOB, nausea, vomiting, sweating)? SOB  3. How long have you been experiencing CP? 1 month  4. Is your CP continuous or coming and going? Coming and going  5. Have you taken Nitroglycerin? NO ?

## 2018-08-11 NOTE — Patient Instructions (Addendum)
Medication Instructions:  Your physician has recommended you make the following change in your medication:   START Isosorbide mononitrate (Imdur) 30 mg once daily   Labwork: TODAY - STAT Troponin, CBC w/diff Cholesterol, liver panel, TSH, Basic metabolic panel   Testing/Procedures: Your physician has ordered a chest xray to be done today Go to [address], [facility] and walk into [facility]  Your physician has requested that you have an echocardiogram. Echocardiography is a painless test that uses sound waves to create images of your heart. It provides your doctor with information about the size and shape of your heart and how well your heart's chambers and valves are working. This procedure takes approximately one hour. There are no restrictions for this procedure.  Your physician has requested that you have a lexiscan myoview. For further information please visit [email or web address] Please follow instruction sheet, as given.   Follow-Up: Your physician recommends that you keep your follow-up appointment on 10/31   If you need a refill on your cardiac medications before your next appointment, please call your pharmacy.   Thank you for choosing [facility]! Eligha Bridegroom, RN [phone]

## 2018-08-12 ENCOUNTER — Encounter (HOSPITAL_COMMUNITY)
Admission: EM | Disposition: A | Discharge status: Home / Self Care | Payer: Self-pay | Source: Ambulatory Visit | Attending: Cardiovascular Disease

## 2018-08-12 ENCOUNTER — Inpatient Hospital Stay (HOSPITAL_COMMUNITY): Payer: Medicare Other

## 2018-08-12 DIAGNOSIS — R0601 Orthopnea: Secondary | ICD-10-CM | POA: Diagnosis present

## 2018-08-12 DIAGNOSIS — R072 Precordial pain: Secondary | ICD-10-CM

## 2018-08-12 DIAGNOSIS — R001 Bradycardia, unspecified: Secondary | ICD-10-CM

## 2018-08-12 DIAGNOSIS — I440 Atrioventricular block, first degree: Secondary | ICD-10-CM

## 2018-08-12 DIAGNOSIS — Z951 Presence of aortocoronary bypass graft: Secondary | ICD-10-CM

## 2018-08-12 DIAGNOSIS — I4510 Unspecified right bundle-branch block: Secondary | ICD-10-CM

## 2018-08-12 DIAGNOSIS — I5032 Chronic diastolic (congestive) heart failure: Secondary | ICD-10-CM

## 2018-08-12 LAB — LIPID PANEL
CHOL/HDL RATIO: 4.1 ratio (ref 0.0–4.4)
CHOLESTEROL: 149 mg/dL (ref 0–200)
Cholesterol, Total: 183 mg/dL (ref 100–199)
HDL: 45 mg/dL (ref >39)
HDL: 34 mg/dL — ABNORMAL LOW (ref >40)
LDL Calculated: 93 mg/dL (ref 0–99)
LDL Cholesterol: 81 mg/dL (ref 0–99)
Total CHOL/HDL Ratio: 4.4 RATIO
Triglycerides: 224 mg/dL — ABNORMAL HIGH (ref 0–149)
Triglycerides: 171 mg/dL — ABNORMAL HIGH (ref <150)
VLDL Cholesterol Cal: 45 mg/dL — ABNORMAL HIGH (ref 5–40)
VLDL: 34 mg/dL (ref 0–40)

## 2018-08-12 LAB — BASIC METABOLIC PANEL
Anion gap: 14 (ref 5–15)
BUN/Creatinine Ratio: 15 (ref 12–28)
BUN: 18 mg/dL (ref 8–27)
BUN: 22 mg/dL (ref 8–23)
CALCIUM: 8.8 mg/dL — AB (ref 8.9–10.3)
CO2: 16 mmol/L — ABNORMAL LOW (ref 20–29)
CO2: 23 mmol/L (ref 22–32)
CREATININE: 1.41 mg/dL — AB (ref 0.44–1.00)
Calcium: 10.3 mg/dL (ref 8.7–10.3)
Chloride: 101 mmol/L (ref 96–106)
Chloride: 103 mmol/L (ref 98–111)
Creatinine, Ser: 1.21 mg/dL — ABNORMAL HIGH (ref 0.57–1.00)
GFR calc Af Amer: 39 mL/min — ABNORMAL LOW (ref >60)
GFR, EST AFRICAN AMERICAN: 48 mL/min/{1.73_m2} — AB (ref >59)
GFR, EST NON AFRICAN AMERICAN: 42 mL/min/{1.73_m2} — AB (ref >59)
GFR, EST NON AFRICAN AMERICAN: 34 mL/min — AB (ref >60)
Glucose, Bld: 127 mg/dL — ABNORMAL HIGH (ref 70–99)
Glucose: 120 mg/dL — ABNORMAL HIGH (ref 65–99)
POTASSIUM: 4.4 mmol/L (ref 3.5–5.2)
Potassium: 3.7 mmol/L (ref 3.5–5.1)
SODIUM: 141 mmol/L (ref 134–144)
SODIUM: 140 mmol/L (ref 135–145)

## 2018-08-12 LAB — HEPATIC FUNCTION PANEL
ALBUMIN: 4.4 g/dL (ref 3.5–4.7)
ALT: 28 IU/L (ref 0–32)
AST: 25 IU/L (ref 0–40)
Alkaline Phosphatase: 78 IU/L (ref 39–117)
BILIRUBIN TOTAL: 0.7 mg/dL (ref 0.0–1.2)
Bilirubin, Direct: 0.22 mg/dL (ref 0.00–0.40)
Total Protein: 6.4 g/dL (ref 6.0–8.5)

## 2018-08-12 LAB — CBC
HEMATOCRIT: 38.3 % (ref 36.0–46.0)
HEMOGLOBIN: 12.5 g/dL (ref 12.0–15.0)
MCH: 30.3 pg (ref 26.0–34.0)
MCHC: 32.6 g/dL (ref 30.0–36.0)
MCV: 92.7 fL (ref 78.0–100.0)
Platelets: 169 10*3/uL (ref 150–400)
RBC: 4.13 MIL/uL (ref 3.87–5.11)
RDW: 13.7 % (ref 11.5–15.5)
WBC: 7.8 10*3/uL (ref 4.0–10.5)

## 2018-08-12 LAB — HEPARIN LEVEL (UNFRACTIONATED)
HEPARIN UNFRACTIONATED: 0.3 [IU]/mL (ref 0.30–0.70)
Heparin Unfractionated: 0.14 IU/mL — ABNORMAL LOW (ref 0.30–0.70)

## 2018-08-12 LAB — TROPONIN I
TROPONIN I: 0.26 ng/mL — AB (ref <0.03)
TROPONIN I: 0.16 ng/mL — AB (ref <0.03)

## 2018-08-12 LAB — ECHOCARDIOGRAM COMPLETE
HEIGHTINCHES: 64 in
WEIGHTICAEL: 3414.48 [oz_av]

## 2018-08-12 LAB — BRAIN NATRIURETIC PEPTIDE: B Natriuretic Peptide: 97.5 pg/mL (ref 0.0–100.0)

## 2018-08-12 LAB — TSH: TSH: 1.58 u[IU]/mL (ref 0.450–4.500)

## 2018-08-12 LAB — GLUCOSE, CAPILLARY: Glucose-Capillary: 199 mg/dL — ABNORMAL HIGH (ref 70–99)

## 2018-08-12 SURGERY — LEFT HEART CATH AND CORONARY ANGIOGRAPHY
Anesthesia: LOCAL

## 2018-08-12 MED ORDER — SODIUM CHLORIDE 0.9% FLUSH: 3.0000 mL | INTRAVENOUS | Status: DC | PRN

## 2018-08-12 MED ORDER — SODIUM CHLORIDE 0.9 % WEIGHT BASED INFUSION: 3.0000 mL/kg/h | INTRAVENOUS | Status: AC

## 2018-08-12 MED ORDER — SODIUM CHLORIDE 0.9 % WEIGHT BASED INFUSION
1.0000 mL/kg/h | INTRAVENOUS | Status: DC
  Administered 2018-08-12 – 2018-08-13 (×2): 1 mL/kg/h via INTRAVENOUS

## 2018-08-12 MED ORDER — SODIUM CHLORIDE 0.9 % WEIGHT BASED INFUSION: 1.0000 mL/kg/h | INTRAVENOUS | Status: DC

## 2018-08-12 MED ORDER — SODIUM CHLORIDE 0.9 % WEIGHT BASED INFUSION
3.0000 mL/kg/h | INTRAVENOUS | Status: DC
  Administered 2018-08-12: 3 mL/kg/h via INTRAVENOUS

## 2018-08-12 MED ORDER — ASPIRIN 81 MG PO CHEW
81.0000 mg | CHEWABLE_TABLET | ORAL | Status: AC
  Administered 2018-08-12: 81 mg via ORAL

## 2018-08-12 MED ORDER — SODIUM CHLORIDE 0.9 % IV SOLN: 250.0000 mL | INTRAVENOUS | Status: DC | PRN

## 2018-08-12 MED ORDER — ASPIRIN 81 MG PO CHEW
CHEWABLE_TABLET | ORAL | Status: AC
  Administered 2018-08-12: 81 mg via ORAL
  Filled 2018-08-12: qty 1

## 2018-08-12 MED ORDER — HEPARIN BOLUS VIA INFUSION
2000.0000 [IU] | Freq: Once | INTRAVENOUS | Status: AC
  Administered 2018-08-12: 2000 [IU] via INTRAVENOUS
  Filled 2018-08-12: qty 2000

## 2018-08-12 MED ORDER — SODIUM CHLORIDE 0.9% FLUSH
3.0000 mL | Freq: Two times a day (BID) | INTRAVENOUS | Status: DC
  Administered 2018-08-12 (×2): 3 mL via INTRAVENOUS

## 2018-08-12 NOTE — Progress Notes (Signed)
ANTICOAGULATION CONSULT NOTE  Pharmacy Consult for heparin Indication: chest pain/ACS  Allergies  Allergen Reactions  . Statins Other (See Comments)    Causes myalgias  . Amitriptyline Other (See Comments)    Pt. States that it caused it to be suicidal.   . Benadryl [Diphenhydramine Hcl (Sleep)] Other (See Comments)    Makes the patient very "hyper"  . Tylenol [Acetaminophen] Nausea Only  . Welchol [Colesevelam Hcl] Other (See Comments)    Causes dizziness    Patient Measurements: Height: 5\' 4"  (162.6 cm) Weight: 213 lb 6.5 oz (96.8 kg) IBW/kg (Calculated) : 54.7 Heparin Dosing Weight: 77 kg  Vital Signs: Temp: 98.1 F (36.7 C) (09/10 0751) Temp Source: Oral (09/10 0751) BP: 138/56 (09/10 0751) Pulse Rate: 78 (09/10 0751)  Labs: Recent Labs    08/11/18 1254  08/11/18 1715 08/11/18 2006 08/12/18 0236 08/12/18 0735  HGB 14.4   < > 14.3 13.9  --  12.5  HCT 41.3  --  43.0 42.2  --  38.3  PLT 213  --  228 212  --  169  LABPROT  --   --  13.2  --   --   --   INR  --   --  1.01  --   --   --   HEPARINUNFRC  --   --   --   --   --  0.14*  CREATININE 1.21*  --  1.39*  --   --  1.41*  TROPONINI  --   --   --  0.26* 0.26* 0.16*   < > = values in this interval not displayed.    Estimated Creatinine Clearance: 34.7 mL/min (A) (by C-G formula based on SCr of 1.41 mg/dL (H)).   Medical History: Past Medical History:  Diagnosis Date  . Arthritis   . Coronary artery disease 1999   CABG  . Depression   . Diabetes mellitus without complication (HCC)    type II; metformin and Amaryl  . Diabetic neuropathy (HCC)   . History of anemia as a child   . HOH (hard of hearing)   . Hypertension   . Hypothyroidism    synthroid  . PONV (postoperative nausea and vomiting)   . Restless leg syndrome   . Shortness of breath 09/03/12   ECHO- The LA is Moderately Dilated, mild mitral annular calcification. mild pulmonary hypertension, moderate concentric LV hypertrophy. Atrial septum  is aneurysmal. Aortic valve appears to be mildly sclerotic.EF 88%  . Urinary incontinence   . Weakness of both legs     Medications:  Infusions:  . sodium chloride    . sodium chloride 1 mL/kg/hr (08/12/18 0736)  . heparin 900 Units/hr (08/12/18 0636)    Assessment: 84 yo female admitted with chest pain, elevated troponin.  Pharmacy asked to start IV heparin.  Likely going to cath lab tomorrow.  Baseline CBC WNL.  No anticoagulants PTA.  Heparin level low this morning at 0.14. Appears that plan is for possible cath but I do not see her on the board for this morning. Will rebolus heparin and increase rate. CBC trending down, no bleeding noted.   Goal of Therapy:  Heparin level 0.3-0.7 units/ml Monitor platelets by anticoagulation protocol: Yes   Plan:  Rebolus heparin 2000 units x1 Increase rate to 1100 units/hr Recheck heparin level in 8 hours  Sheppard Coil PharmD., BCPS Clinical Pharmacist 08/12/2018 9:26 AM

## 2018-08-12 NOTE — Progress Notes (Signed)
DAILY PROGRESS NOTE   Patient Name: Sierra Wright Date of Encounter: 08/12/2018  Chief Complaint   No further chest pain  Patient Profile   84 yo female with history of CABG - 16 years ago by Dr. Servando Snare, normal systolic and mild diastolic dysfunction with chronic diastolic CHF and HTN, presented for chest pain and dyspnea and was found to have elevated troponin to 0.068 -> admitted.   Subjective   No further chest pain overnight. Net positive. Troponin elevated to 0.26, then 0.26 and 0.16. Trend not typical for ACS. Creatinine is stably elevated at 1.4. Lipid profile with LDL 81 and TG 171. CXR yesterday shows mild opacity in the left base, suspect atelectasis or mild infiltrate. Echo pending. Mild orthopnea, but has been present for >1 year. Concern about GFR and risk of contrast nephropathy with cath. HR noted to drop overnight into 40-50's. Appears to be sinus brady with 1st degree AVB and RBBB.  Objective   Vitals:   08/12/18 0305 08/12/18 0500 08/12/18 0616 08/12/18 0751  BP: 103/62 (!) 114/56  (!) 138/56  Pulse: 77   78  Resp: (!) 24   16  Temp: 98 F (36.7 C)   98.1 F (36.7 C)  TempSrc: Oral   Oral  SpO2: 91%   96%  Weight:   96.8 kg   Height:        Intake/Output Summary (Last 24 hours) at 08/12/2018 1053 Last data filed at 08/12/2018 1034 Gross per 24 hour  Intake 415.21 ml  Output 700 ml  Net -284.79 ml   Filed Weights   08/11/18 1712 08/11/18 1942 08/12/18 0616  Weight: 94.8 kg 97.3 kg 96.8 kg    Physical Exam   General appearance: alert, no distress and moderately obese Neck: no carotid bruit, no JVD and thyroid not enlarged, symmetric, no tenderness/mass/nodules Lungs: diminished breath sounds bibasilar Heart: regular rate and rhythm Abdomen: soft, non-tender; bowel sounds normal; no masses,  no organomegaly Extremities: extremities normal, atraumatic, no cyanosis or edema Pulses: 2+ and symmetric Skin: Skin color, texture, turgor normal. No  rashes or lesions Neurologic: Grossly normal Psych: Pleasant  Inpatient Medications    Scheduled Meds: . amLODipine  5 mg Oral Daily  . aspirin EC  81 mg Oral Daily  . benazepril  40 mg Oral Daily  . carbidopa-levodopa  1 tablet Oral QHS  . clopidogrel  75 mg Oral Daily  . furosemide  40 mg Oral Daily   And  . furosemide  20 mg Oral Q supper  . gabapentin  200 mg Oral QHS  . glimepiride  1 mg Oral Q breakfast  . hydrALAZINE  25 mg Oral TID  . Influenza vac split quadrivalent PF  0.5 mL Intramuscular Tomorrow-1000  . isosorbide mononitrate  30 mg Oral Daily  . levothyroxine  100 mcg Oral QAC breakfast  . metoprolol succinate  25 mg Oral Daily  . potassium chloride SA  20 mEq Oral BID  . sodium chloride flush  3 mL Intravenous Q12H  . tiZANidine  2 mg Oral QHS    Continuous Infusions: . sodium chloride    . sodium chloride 1 mL/kg/hr (08/12/18 0736)  . heparin 1,100 Units/hr (08/12/18 1019)    PRN Meds: sodium chloride, nitroGLYCERIN, sodium chloride flush   Labs   Results for orders placed or performed during the hospital encounter of 08/11/18 (from the past 48 hour(s))  Basic metabolic panel     Status: Abnormal   Collection Time: 08/11/18  5:15 PM  Result Value Ref Range   Sodium 136 135 - 145 mmol/L   Potassium 3.7 3.5 - 5.1 mmol/L   Chloride 101 98 - 111 mmol/L   CO2 17 (L) 22 - 32 mmol/L   Glucose, Bld 222 (H) 70 - 99 mg/dL   BUN 21 8 - 23 mg/dL   Creatinine, Ser 1.39 (H) 0.44 - 1.00 mg/dL   Calcium 9.6 8.9 - 10.3 mg/dL   GFR calc non Af Amer 34 (L) >60 mL/min   GFR calc Af Amer 40 (L) >60 mL/min    Comment: (NOTE) The eGFR has been calculated using the CKD EPI equation. This calculation has not been validated in all clinical situations. eGFR's persistently <60 mL/min signify possible Chronic Kidney Disease.    Anion gap 18 (H) 5 - 15    Comment: Performed at [facility], [place] [address] [place], [place] [address]  CBC     Status: Abnormal    Collection Time: 08/11/18  5:15 PM  Result Value Ref Range   WBC 12.2 (H) 4.0 - 10.5 K/uL   RBC 4.67 3.87 - 5.11 MIL/uL   Hemoglobin 14.3 12.0 - 15.0 g/dL   HCT 43.0 36.0 - 46.0 %   MCV 92.1 78.0 - 100.0 fL   MCH 30.6 26.0 - 34.0 pg   MCHC 33.3 30.0 - 36.0 g/dL   RDW 13.5 11.5 - 15.5 %   Platelets 228 150 - 400 K/uL    Comment: Performed at [facility] [address], [place], [place] [address]  Protime-INR (order if Patient is taking Coumadin / Warfarin)     Status: None   Collection Time: 08/11/18  5:15 PM  Result Value Ref Range   Prothrombin Time 13.2 11.4 - 15.2 seconds   INR 1.01     Comment: Performed at [facility] [address] [place], [place] [address]  I-stat troponin, ED     Status: Abnormal   Collection Time: 08/11/18  5:32 PM  Result Value Ref Range   Troponin i, poc 0.11 (HH) 0.00 - 0.08 ng/mL   Comment NOTIFIED PHYSICIAN    Comment 3            Comment: Due to the release kinetics of cTnI, a negative result within the first hours of the onset of symptoms does not rule out myocardial infarction with certainty. If myocardial infarction is still suspected, repeat the test at appropriate intervals.   Troponin I (q 6hr x 3)     Status: Abnormal   Collection Time: 08/11/18  8:06 PM  Result Value Ref Range   Troponin I 0.26 (HH) <0.03 ng/mL    Comment: CRITICAL RESULT CALLED TO, READ BACK BY AND VERIFIED WITH: MILLER,J RN 08/11/2018 2220 JORDANS Performed at [facility], [place] [address] [place], [place] [address]   CBC     Status: Abnormal   Collection Time: 08/11/18  8:06 PM  Result Value Ref Range   WBC 11.2 (H) 4.0 - 10.5 K/uL   RBC 4.63 3.87 - 5.11 MIL/uL   Hemoglobin 13.9 12.0 - 15.0 g/dL   HCT 42.2 36.0 - 46.0 %   MCV 91.1 78.0 - 100.0 fL   MCH 30.0 26.0 - 34.0 pg   MCHC 32.9 30.0 - 36.0 g/dL   RDW 13.3 11.5 - 15.5 %   Platelets 212 150 - 400 K/uL    Comment: Performed at [facility]  [address] [place],  [place] [address]  TSH     Status: None   Collection Time: 08/11/18  8:06 PM  Result Value Ref Range   TSH 3.277 0.350 - 4.500 uIU/mL    Comment: Performed by a 3rd Generation assay with a functional sensitivity of <=0.01 uIU/mL. Performed at [facility], [place] [address], [place], [place] [address]   Hepatic function panel     Status: Abnormal   Collection Time: 08/11/18  8:06 PM  Result Value Ref Range   Total Protein 6.3 (L) 6.5 - 8.1 g/dL   Albumin 3.9 3.5 - 5.0 g/dL   AST 31 15 - 41 U/L   ALT 30 0 - 44 U/L   Alkaline Phosphatase 67 38 - 126 U/L   Total Bilirubin 0.8 0.3 - 1.2 mg/dL   Bilirubin, Direct 0.1 0.0 - 0.2 mg/dL   Indirect Bilirubin 0.7 0.3 - 0.9 mg/dL    Comment: Performed at [facility] [address] [place], [place] [address]  MRSA PCR Screening     Status: None   Collection Time: 08/11/18  9:13 PM  Result Value Ref Range   MRSA by PCR NEGATIVE NEGATIVE    Comment:        The GeneXpert MRSA Assay (FDA approved for NASAL specimens only), is one component of a comprehensive MRSA colonization surveillance program. It is not intended to diagnose MRSA infection nor to guide or monitor treatment for MRSA infections. Performed at [facility], [place] [address], [place], [place] [address]   Troponin I (q 6hr x 3)     Status: Abnormal   Collection Time: 08/12/18  2:36 AM  Result Value Ref Range   Troponin I 0.26 (HH) <0.03 ng/mL    Comment: CRITICAL VALUE NOTED.  VALUE IS CONSISTENT WITH PREVIOUSLY REPORTED AND CALLED VALUE. Performed at [facility], [place] [address] [place], [place] [address]   Lipid panel     Status: Abnormal   Collection Time: 08/12/18  2:36 AM  Result Value Ref Range   Cholesterol 149 0 - 200 mg/dL   Triglycerides 171 (H) <150 mg/dL   HDL 34 (L) >40 mg/dL   Total CHOL/HDL Ratio 4.4 RATIO   VLDL 34 0 - 40 mg/dL   LDL Cholesterol 81 0 - 99 mg/dL    Comment:        Total Cholesterol/HDL:CHD Risk Coronary Heart Disease  Risk Table                     Men   Women  1/2 Average Risk   3.4   3.3  Average Risk       5.0   4.4  2 X Average Risk   9.6   7.1  3 X Average Risk  23.4   11.0        Use the calculated Patient Ratio above and the CHD Risk Table to determine the patient's CHD Risk.        ATP III CLASSIFICATION (LDL):  <100     mg/dL   Optimal  100-129  mg/dL   Near or Above                    Optimal  130-159  mg/dL   Borderline  160-189  mg/dL   High  >190     mg/dL   Very High Performed at [facility] [address] [place], [place] [address]  Troponin I (q 6hr x 3)     Status: Abnormal   Collection Time: 08/12/18  7:35 AM  Result Value Ref Range   Troponin I 0.16 (HH) <0.03 ng/mL    Comment: CRITICAL VALUE NOTED.  VALUE IS CONSISTENT WITH PREVIOUSLY REPORTED AND CALLED VALUE. Performed at [facility], [place] [address] [place], [place] [address]   Basic metabolic panel     Status: Abnormal   Collection Time: 08/12/18  7:35 AM  Result Value Ref Range   Sodium 140 135 - 145 mmol/L   Potassium 3.7 3.5 - 5.1 mmol/L   Chloride 103 98 - 111 mmol/L   CO2 23 22 - 32 mmol/L   Glucose, Bld 127 (H) 70 - 99 mg/dL   BUN 22 8 - 23 mg/dL   Creatinine, Ser 1.41 (H) 0.44 - 1.00 mg/dL   Calcium 8.8 (L) 8.9 - 10.3 mg/dL   GFR calc non Af Amer 34 (L) >60 mL/min   GFR calc Af Amer 39 (L) >60 mL/min    Comment: (NOTE) The eGFR has been calculated using the CKD EPI equation. This calculation has not been validated in all clinical situations. eGFR's persistently <60 mL/min signify possible Chronic Kidney Disease.    Anion gap 14 5 - 15    Comment: Performed at [facility] [address], [place], [place] [address]  Heparin level (unfractionated)     Status: Abnormal   Collection Time: 08/12/18  7:35 AM  Result Value Ref Range   Heparin Unfractionated 0.14 (L) 0.30 - 0.70 IU/mL    Comment: (NOTE) If heparin results are below expected values, and patient dosage has  been  confirmed, suggest follow up testing of antithrombin III levels. Performed at [facility], [place] [address] [place], [place] [address]   CBC     Status: None   Collection Time: 08/12/18  7:35 AM  Result Value Ref Range   WBC 7.8 4.0 - 10.5 K/uL   RBC 4.13 3.87 - 5.11 MIL/uL   Hemoglobin 12.5 12.0 - 15.0 g/dL   HCT 38.3 36.0 - 46.0 %   MCV 92.7 78.0 - 100.0 fL   MCH 30.3 26.0 - 34.0 pg   MCHC 32.6 30.0 - 36.0 g/dL   RDW 13.7 11.5 - 15.5 %   Platelets 169 150 - 400 K/uL    Comment: Performed at [facility], [place] [address], [place], [place] [address]    ECG   Sinus brady at 45, RBBB, 1st degree AVB - Personally Reviewed  Telemetry   Sinus rhythm- Personally Reviewed  Radiology    Dg Chest 1 View  Result Date: 08/11/2018 CLINICAL DATA:  Shortness of breath for several weeks with cough EXAM: CHEST  1 VIEW COMPARISON:  09/01/2016 FINDINGS: Cardiac shadow is stable. Postsurgical changes are again seen. The lungs are well aerated bilaterally. No focal infiltrate or sizable effusion is seen. No acute bony abnormality is noted. Chronic changes about the right shoulder joint are again noted. IMPRESSION: No acute abnormality seen. Electronically Signed   By: Inez Catalina M.D.   On: 08/11/2018 16:31   Dg Chest 2 View  Result Date: 08/11/2018 CLINICAL DATA:  Chest pain EXAM: CHEST - 2 VIEW COMPARISON:  08/11/2018 FINDINGS: Post sternotomy changes. No pleural effusion. Patchy opacity at the lingula and left base. Stable cardiomediastinal silhouette with aortic atherosclerosis. No pneumothorax. Degenerative changes of the spine. IMPRESSION: Patchy opacity at the lingula and left base may reflect  atelectasis or mild infiltrate. Electronically Signed   By: Donavan Foil M.D.   On: 08/11/2018 17:40    Cardiac Studies   Echo pending  Assessment   Principal Problem:   Orthopnea Active Problems:   Hx of CABG   Right bundle branch block   First degree AV block   Sinus  bradycardia by electrocardiogram   Chronic diastolic CHF (congestive heart failure) (HCC)   Chest pain   Plan   1. No further chest pain. Lacie Draft is a symptom, however, this has been present for a while. Overnight was bradycardic with RBBB, but this is an old problem - will monitor. Concerned that creatinine will not support cath and elevated troponin has been flat, not consistent with ACS - may be more related to CKD. I think we need more evidence for ischemia before exposing her to contrast dye risk. Will proceed with echo today and possible myoview tomorrow vs. Cath based on findings. Add BNP to labs - does not seem markedly volume overloaded on exam, but weight is up over the past few months.  Time Spent Directly with Patient:  I have spent a total of 35 minutes with the patient reviewing hospital notes, telemetry, EKGs, labs and examining the patient as well as establishing an assessment and plan that was discussed personally with the patient.  > 50% of time was spent in direct patient care.  Length of Stay:  LOS: 1 day   Pixie Casino, MD, [facility], [place] Director of the Advanced Lipid Disorders &  Cardiovascular Risk Reduction Clinic Diplomate of the American Board of Clinical Lipidology Attending Cardiologist  Direct Dial: [phone]  Fax: [phone]  Website:  [email or web address] Siegfried 08/12/2018, 10:53 AM

## 2018-08-12 NOTE — Progress Notes (Signed)
Notified MD of HR dropping to 40-50s and MAPs in 50s.  EKG obtained. No new orders at this time.

## 2018-08-13 ENCOUNTER — Inpatient Hospital Stay (HOSPITAL_COMMUNITY): Payer: Medicare Other

## 2018-08-13 ENCOUNTER — Encounter (HOSPITAL_COMMUNITY): Payer: Self-pay | Admitting: Cardiovascular Disease

## 2018-08-13 ENCOUNTER — Inpatient Hospital Stay (HOSPITAL_COMMUNITY)
Admission: EM | Disposition: A | Discharge status: Home / Self Care | Payer: Self-pay | Source: Ambulatory Visit | Attending: Cardiovascular Disease

## 2018-08-13 DIAGNOSIS — I2510 Atherosclerotic heart disease of native coronary artery without angina pectoris: Secondary | ICD-10-CM

## 2018-08-13 DIAGNOSIS — R079 Chest pain, unspecified: Secondary | ICD-10-CM

## 2018-08-13 DIAGNOSIS — R748 Abnormal levels of other serum enzymes: Secondary | ICD-10-CM

## 2018-08-13 HISTORY — PX: LEFT HEART CATH AND CORONARY ANGIOGRAPHY: CATH118249

## 2018-08-13 LAB — CBC
HEMATOCRIT: 37.4 % (ref 36.0–46.0)
Hemoglobin: 12 g/dL (ref 12.0–15.0)
MCH: 29.9 pg (ref 26.0–34.0)
MCHC: 32.1 g/dL (ref 30.0–36.0)
MCV: 93.3 fL (ref 78.0–100.0)
Platelets: 158 10*3/uL (ref 150–400)
RBC: 4.01 MIL/uL (ref 3.87–5.11)
RDW: 13.7 % (ref 11.5–15.5)
WBC: 7.1 10*3/uL (ref 4.0–10.5)

## 2018-08-13 LAB — NM MYOCAR SINGLE W/SPECT
CHL CUP RESTING HR STRESS: 69 {beats}/min
Estimated workload: 1 METS
Exercise duration (min): 14 min
Exercise duration (sec): 23 s
Peak HR: 117 {beats}/min

## 2018-08-13 LAB — BASIC METABOLIC PANEL
Anion gap: 10 (ref 5–15)
BUN: 22 mg/dL (ref 8–23)
CALCIUM: 8.5 mg/dL — AB (ref 8.9–10.3)
CHLORIDE: 109 mmol/L (ref 98–111)
CO2: 20 mmol/L — AB (ref 22–32)
Creatinine, Ser: 1.31 mg/dL — ABNORMAL HIGH (ref 0.44–1.00)
GFR calc Af Amer: 43 mL/min — ABNORMAL LOW (ref >60)
GFR calc non Af Amer: 37 mL/min — ABNORMAL LOW (ref >60)
Glucose, Bld: 156 mg/dL — ABNORMAL HIGH (ref 70–99)
Potassium: 3.8 mmol/L (ref 3.5–5.1)
Sodium: 139 mmol/L (ref 135–145)

## 2018-08-13 LAB — HEPARIN LEVEL (UNFRACTIONATED): Heparin Unfractionated: 0.3 IU/mL (ref 0.30–0.70)

## 2018-08-13 SURGERY — LEFT HEART CATH AND CORONARY ANGIOGRAPHY
Anesthesia: LOCAL

## 2018-08-13 MED ORDER — TECHNETIUM TC 99M TETROFOSMIN IV KIT
30.0000 | PACK | Freq: Once | INTRAVENOUS | Status: AC | PRN
  Administered 2018-08-13: 30 via INTRAVENOUS

## 2018-08-13 MED ORDER — HEPARIN (PORCINE) IN NACL 1000-0.9 UT/500ML-% IV SOLN
INTRAVENOUS | Status: AC
  Filled 2018-08-13: qty 1000

## 2018-08-13 MED ORDER — LIDOCAINE HCL (PF) 1 % IJ SOLN
INTRAMUSCULAR | Status: AC
  Filled 2018-08-13: qty 30

## 2018-08-13 MED ORDER — NITROGLYCERIN 0.4 MG SL SUBL
SUBLINGUAL_TABLET | SUBLINGUAL | Status: AC
  Filled 2018-08-13: qty 1

## 2018-08-13 MED ORDER — VERAPAMIL HCL 2.5 MG/ML IV SOLN
INTRAVENOUS | Status: AC
  Filled 2018-08-13: qty 2

## 2018-08-13 MED ORDER — MORPHINE SULFATE (PF) 2 MG/ML IV SOLN
INTRAVENOUS | Status: AC
  Filled 2018-08-13: qty 1

## 2018-08-13 MED ORDER — HYDRALAZINE HCL 20 MG/ML IJ SOLN
INTRAMUSCULAR | Status: AC
  Filled 2018-08-13: qty 1

## 2018-08-13 MED ORDER — ALPRAZOLAM 0.25 MG PO TABS
0.2500 mg | ORAL_TABLET | Freq: Three times a day (TID) | ORAL | Status: DC | PRN
  Administered 2018-08-13 (×2): 0.25 mg via ORAL
  Filled 2018-08-13 (×2): qty 1

## 2018-08-13 MED ORDER — SODIUM CHLORIDE 0.9% FLUSH: 3.0000 mL | Freq: Two times a day (BID) | INTRAVENOUS | Status: DC

## 2018-08-13 MED ORDER — TECHNETIUM TC 99M TETROFOSMIN IV KIT
10.0000 | PACK | Freq: Once | INTRAVENOUS | Status: AC | PRN
  Administered 2018-08-13: 10 via INTRAVENOUS

## 2018-08-13 MED ORDER — VERAPAMIL HCL 2.5 MG/ML IV SOLN
INTRAVENOUS | Status: DC | PRN
  Administered 2018-08-13: 10 mL via INTRA_ARTERIAL

## 2018-08-13 MED ORDER — REGADENOSON 0.4 MG/5ML IV SOLN
INTRAVENOUS | Status: AC
  Filled 2018-08-13: qty 5

## 2018-08-13 MED ORDER — MIDAZOLAM HCL 2 MG/2ML IJ SOLN
INTRAMUSCULAR | Status: DC | PRN
  Administered 2018-08-13 (×2): 1 mg via INTRAVENOUS

## 2018-08-13 MED ORDER — FENTANYL CITRATE (PF) 100 MCG/2ML IJ SOLN
INTRAMUSCULAR | Status: AC
  Filled 2018-08-13: qty 2

## 2018-08-13 MED ORDER — MORPHINE SULFATE (PF) 10 MG/ML IV SOLN: 1.0000 mg | Freq: Once | INTRAVENOUS | Status: DC

## 2018-08-13 MED ORDER — HEPARIN (PORCINE) IN NACL 1000-0.9 UT/500ML-% IV SOLN
INTRAVENOUS | Status: DC | PRN
  Administered 2018-08-13 (×2): 500 mL

## 2018-08-13 MED ORDER — FUROSEMIDE 10 MG/ML IJ SOLN
INTRAMUSCULAR | Status: AC
  Filled 2018-08-13: qty 4

## 2018-08-13 MED ORDER — MORPHINE SULFATE (PF) 10 MG/ML IV SOLN
1.0000 mg | Freq: Once | INTRAVENOUS | Status: AC
  Administered 2018-08-13: 1 mg via INTRAVENOUS

## 2018-08-13 MED ORDER — HEPARIN SODIUM (PORCINE) 1000 UNIT/ML IJ SOLN
INTRAMUSCULAR | Status: AC
  Filled 2018-08-13: qty 1

## 2018-08-13 MED ORDER — SODIUM CHLORIDE 0.9% FLUSH: 3.0000 mL | INTRAVENOUS | Status: DC | PRN

## 2018-08-13 MED ORDER — IOHEXOL 350 MG/ML SOLN
INTRAVENOUS | Status: DC | PRN
  Administered 2018-08-13: 85 mL via INTRAVENOUS

## 2018-08-13 MED ORDER — HEPARIN SODIUM (PORCINE) 1000 UNIT/ML IJ SOLN
INTRAMUSCULAR | Status: DC | PRN
  Administered 2018-08-13: 5000 [IU] via INTRAVENOUS

## 2018-08-13 MED ORDER — SODIUM CHLORIDE 0.9 % IV SOLN: 250.0000 mL | INTRAVENOUS | Status: DC | PRN

## 2018-08-13 MED ORDER — MIDAZOLAM HCL 2 MG/2ML IJ SOLN
INTRAMUSCULAR | Status: AC
  Filled 2018-08-13: qty 2

## 2018-08-13 MED ORDER — REGADENOSON 0.4 MG/5ML IV SOLN
0.4000 mg | Freq: Once | INTRAVENOUS | Status: AC
  Administered 2018-08-13: 0.4 mg via INTRAVENOUS
  Filled 2018-08-13: qty 5

## 2018-08-13 MED ORDER — SODIUM CHLORIDE 0.9 % IV SOLN
INTRAVENOUS | Status: AC
  Administered 2018-08-13: 13:00:00 via INTRAVENOUS

## 2018-08-13 MED ORDER — FUROSEMIDE 10 MG/ML IJ SOLN
INTRAMUSCULAR | Status: DC | PRN
  Administered 2018-08-13: 40 mg via INTRAVENOUS

## 2018-08-13 MED ORDER — FUROSEMIDE 10 MG/ML IJ SOLN: 40.0000 mg | Freq: Every day | INTRAMUSCULAR | Status: DC

## 2018-08-13 MED ORDER — LIDOCAINE HCL (PF) 1 % IJ SOLN
INTRAMUSCULAR | Status: DC | PRN
  Administered 2018-08-13: 2 mL

## 2018-08-13 SURGICAL SUPPLY — 10 items

## 2018-08-13 NOTE — Progress Notes (Addendum)
Patient presented for Lexiscan. At baseline has mild cognitive deficit. Patient had gradual worsening of ST depression in inferior lateral leads. At least 23mm. She started to have 7/10 chest pain 3 minutes after lexiscan inj. Also has SOB. Given IV morphine 1mg  x 1. She continued to have chest pain dyspnea. She was seen and examined by Dr. Rennis Golden. Taken to cath lab for definite evaluation of coronary anatomy. Seems she was in mild respiratory distress with chocking feeling.

## 2018-08-13 NOTE — Care Management Note (Addendum)
Case Management Note  Patient Details  Name: Sierra Wright MRN: [id number] Date of Birth: 29-Jul-1934  Subjective/Objective:   Pt admitted with orthopnea                  Action/Plan:  PTA from home husband - wheelchair bound at baseline.Pt has PCP and denied barriers with paying for medications as prescribed.   Per pt and husband they have all the equipment needed at home.  Husband will transport wife home via private vehicle.  No CM needs determined at the time this note was written   Expected Discharge Date:                  Expected Discharge Plan:  Home/Self Care  In-House Referral:     Discharge planning Services  CM Consult  Post Acute Care Choice:    Choice offered to:     DME Arranged:    DME Agency:     HH Arranged:    HH Agency:     Status of Service:     If discussed at [employer], dates discussed:    Additional Comments:  Cherylann Parr, RN 08/13/2018, 11:20 AM

## 2018-08-13 NOTE — Interval H&P Note (Signed)
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 After consideration of risks, benefits and other options for treatment, the patient has consented to  Procedure(s): LEFT HEART CATH AND CORONARY ANGIOGRAPHY (N/A) as a surgical intervention .  The patient's history has been reviewed, patient examined, no change in status, stable for surgery.  I have reviewed the patient's chart and labs.  Questions were answered to the patient's satisfaction.    Cath Lab Visit (complete for each Cath Lab visit)  Clinical Evaluation Leading to the Procedure:   ACS: Yes.    Non-ACS:    Anginal Classification: CCS III  Anti-ischemic medical therapy: Minimal Therapy (1 class of medications)  Non-Invasive Test Results: No non-invasive testing performed  Prior CABG: Previous CABG         Verne Carrow

## 2018-08-13 NOTE — Progress Notes (Signed)
Pt taken to cath lab. Awake and alert. Dr. Rennis Golden at bedside. Report given to cath lab RN and pts floor RN.

## 2018-08-13 NOTE — H&P (View-Only) (Signed)
DAILY PROGRESS NOTE   Patient Name: Sierra Wright Date of Encounter: 08/13/2018  Chief Complaint   No further chest pain  Patient Profile   84 yo female with history of CABG - 16 years ago by Dr. Servando Snare, normal systolic and mild diastolic dysfunction with chronic diastolic CHF and HTN, presented for chest pain and dyspnea and was found to have elevated troponin to 0.068 -> admitted.   Subjective   Creatinine improved overnight. No further chest pain. Echo yesterday showed LVEF 60-65%, no regional wall motion abnormalities and mild mitral annular calcification.   Objective   Vitals:   08/12/18 2114 08/12/18 2334 08/13/18 0241 08/13/18 0751  BP: 131/66 (!) 124/47 (!) 145/59 (!) 159/61  Pulse:  85 80 78  Resp:  (!) 26 (!) 28 14  Temp:  98.8 F (37.1 C) 97.9 F (36.6 C) 97.9 F (36.6 C)  TempSrc:  Oral Oral Oral  SpO2:  95% 97% 96%  Weight:      Height:        Intake/Output Summary (Last 24 hours) at 08/13/2018 0837 Last data filed at 08/13/2018 0300 Gross per 24 hour  Intake 3006.7 ml  Output 1900 ml  Net 1106.7 ml   Filed Weights   08/11/18 1712 08/11/18 1942 08/12/18 0616  Weight: 94.8 kg 97.3 kg 96.8 kg    Physical Exam   General appearance: alert, no distress and moderately obese Neck: no carotid bruit, no JVD and thyroid not enlarged, symmetric, no tenderness/mass/nodules Lungs: diminished breath sounds bibasilar Heart: regular rate and rhythm Abdomen: soft, non-tender; bowel sounds normal; no masses,  no organomegaly Extremities: extremities normal, atraumatic, no cyanosis or edema Pulses: 2+ and symmetric Skin: Skin color, texture, turgor normal. No rashes or lesions Neurologic: Grossly normal Psych: Pleasant  Inpatient Medications    Scheduled Meds: . amLODipine  5 mg Oral Daily  . aspirin EC  81 mg Oral Daily  . benazepril  40 mg Oral Daily  . carbidopa-levodopa  1 tablet Oral QHS  . clopidogrel  75 mg Oral Daily  . furosemide  40 mg Oral  Daily   And  . furosemide  20 mg Oral Q supper  . gabapentin  200 mg Oral QHS  . glimepiride  1 mg Oral Q breakfast  . hydrALAZINE  25 mg Oral TID  . Influenza vac split quadrivalent PF  0.5 mL Intramuscular Tomorrow-1000  . isosorbide mononitrate  30 mg Oral Daily  . levothyroxine  100 mcg Oral QAC breakfast  . metoprolol succinate  25 mg Oral Daily  . potassium chloride SA  20 mEq Oral BID  . sodium chloride flush  3 mL Intravenous Q12H  . tiZANidine  2 mg Oral QHS    Continuous Infusions: . sodium chloride    . sodium chloride 1 mL/kg/hr (08/12/18 0736)  . heparin 1,100 Units/hr (08/13/18 0300)    PRN Meds: sodium chloride, nitroGLYCERIN, sodium chloride flush   Labs   Results for orders placed or performed during the hospital encounter of 08/11/18 (from the past 48 hour(s))  Basic metabolic panel     Status: Abnormal   Collection Time: 08/11/18  5:15 PM  Result Value Ref Range   Sodium 136 135 - 145 mmol/L   Potassium 3.7 3.5 - 5.1 mmol/L   Chloride 101 98 - 111 mmol/L   CO2 17 (L) 22 - 32 mmol/L   Glucose, Bld 222 (H) 70 - 99 mg/dL   BUN 21 8 - 23 mg/dL  Creatinine, Ser 1.39 (H) 0.44 - 1.00 mg/dL   Calcium 9.6 8.9 - 10.3 mg/dL   GFR calc non Af Amer 34 (L) >60 mL/min   GFR calc Af Amer 40 (L) >60 mL/min    Comment: (NOTE) The eGFR has been calculated using the CKD EPI equation. This calculation has not been validated in all clinical situations. eGFR's persistently <60 mL/min signify possible Chronic Kidney Disease.    Anion gap 18 (H) 5 - 15    Comment: Performed at [facility], [place] [address] [place], [place] [address]  CBC     Status: Abnormal   Collection Time: 08/11/18  5:15 PM  Result Value Ref Range   WBC 12.2 (H) 4.0 - 10.5 K/uL   RBC 4.67 3.87 - 5.11 MIL/uL   Hemoglobin 14.3 12.0 - 15.0 g/dL   HCT 43.0 36.0 - 46.0 %   MCV 92.1 78.0 - 100.0 fL   MCH 30.6 26.0 - 34.0 pg   MCHC 33.3 30.0 - 36.0 g/dL   RDW 13.5 11.5 - 15.5 %   Platelets 228  150 - 400 K/uL    Comment: Performed at [facility] [address], [place], [place] [address]  Protime-INR (order if Patient is taking Coumadin / Warfarin)     Status: None   Collection Time: 08/11/18  5:15 PM  Result Value Ref Range   Prothrombin Time 13.2 11.4 - 15.2 seconds   INR 1.01     Comment: Performed at [facility] [address] [place], [place] [address]  I-stat troponin, ED     Status: Abnormal   Collection Time: 08/11/18  5:32 PM  Result Value Ref Range   Troponin i, poc 0.11 (HH) 0.00 - 0.08 ng/mL   Comment NOTIFIED PHYSICIAN    Comment 3            Comment: Due to the release kinetics of cTnI, a negative result within the first hours of the onset of symptoms does not rule out myocardial infarction with certainty. If myocardial infarction is still suspected, repeat the test at appropriate intervals.   Troponin I (q 6hr x 3)     Status: Abnormal   Collection Time: 08/11/18  8:06 PM  Result Value Ref Range   Troponin I 0.26 (HH) <0.03 ng/mL    Comment: CRITICAL RESULT CALLED TO, READ BACK BY AND VERIFIED WITH: MILLER,J RN 08/11/2018 2220 JORDANS Performed at [facility], [place] [address], [place], [place] [address]   CBC     Status: Abnormal   Collection Time: 08/11/18  8:06 PM  Result Value Ref Range   WBC 11.2 (H) 4.0 - 10.5 K/uL   RBC 4.63 3.87 - 5.11 MIL/uL   Hemoglobin 13.9 12.0 - 15.0 g/dL   HCT 42.2 36.0 - 46.0 %   MCV 91.1 78.0 - 100.0 fL   MCH 30.0 26.0 - 34.0 pg   MCHC 32.9 30.0 - 36.0 g/dL   RDW 13.3 11.5 - 15.5 %   Platelets 212 150 - 400 K/uL    Comment: Performed at [facility] [address] [place], [place] [address]  TSH     Status: None   Collection Time: 08/11/18  8:06 PM  Result Value Ref Range   TSH 3.277 0.350 - 4.500 uIU/mL    Comment: Performed by a 3rd Generation assay with a functional sensitivity of <=0.01 uIU/mL. Performed at [facility], [place] [address],  [place], [place] [address]     Hepatic function panel     Status: Abnormal   Collection Time: 08/11/18  8:06 PM  Result Value Ref Range   Total Protein 6.3 (L) 6.5 - 8.1 g/dL   Albumin 3.9 3.5 - 5.0 g/dL   AST 31 15 - 41 U/L   ALT 30 0 - 44 U/L   Alkaline Phosphatase 67 38 - 126 U/L   Total Bilirubin 0.8 0.3 - 1.2 mg/dL   Bilirubin, Direct 0.1 0.0 - 0.2 mg/dL   Indirect Bilirubin 0.7 0.3 - 0.9 mg/dL    Comment: Performed at [facility], [address] [place], [place] [address]  MRSA PCR Screening     Status: None   Collection Time: 08/11/18  9:13 PM  Result Value Ref Range   MRSA by PCR NEGATIVE NEGATIVE    Comment:        The GeneXpert MRSA Assay (FDA approved for NASAL specimens only), is one component of a comprehensive MRSA colonization surveillance program. It is not intended to diagnose MRSA infection nor to guide or monitor treatment for MRSA infections. Performed at [facility], [address] [place], [place] [address]   Troponin I (q 6hr x 3)     Status: Abnormal   Collection Time: 08/12/18  2:36 AM  Result Value Ref Range   Troponin I 0.26 (HH) <0.03 ng/mL    Comment: CRITICAL VALUE NOTED.  VALUE IS CONSISTENT WITH PREVIOUSLY REPORTED AND CALLED VALUE. Performed at [facility], [address] [place], [place] [address]   Lipid panel     Status: Abnormal   Collection Time: 08/12/18  2:36 AM  Result Value Ref Range   Cholesterol 149 0 - 200 mg/dL   Triglycerides 171 (H) <150 mg/dL   HDL 34 (L) >40 mg/dL   Total CHOL/HDL Ratio 4.4 RATIO   VLDL 34 0 - 40 mg/dL   LDL Cholesterol 81 0 - 99 mg/dL    Comment:        Total Cholesterol/HDL:CHD Risk Coronary Heart Disease Risk Table                     Men   Women  1/2 Average Risk   3.4   3.3  Average Risk       5.0   4.4  2 X Average Risk   9.6   7.1  3 X Average Risk  23.4   11.0        Use the calculated Patient Ratio above and the CHD Risk Table to determine the patient's CHD Risk.        ATP III  CLASSIFICATION (LDL):  <100     mg/dL   Optimal  100-129  mg/dL   Near or Above                    Optimal  130-159  mg/dL   Borderline  160-189  mg/dL   High  >190     mg/dL   Very High Performed at [facility], [address] [place], [place] [address]   Troponin I (q 6hr x 3)     Status: Abnormal   Collection Time: 08/12/18  7:35 AM  Result Value Ref Range   Troponin I 0.16 (HH) <0.03 ng/mL    Comment: CRITICAL VALUE NOTED.  VALUE IS CONSISTENT WITH PREVIOUSLY REPORTED AND CALLED VALUE. Performed at [facility], [address] [place], [place] [address]   Basic metabolic panel     Status: Abnormal   Collection Time: 08/12/18  7:35 AM  Result Value Ref Range   Sodium 140 135 - 145 mmol/L   Potassium 3.7 3.5 - 5.1 mmol/L   Chloride 103 98 - 111 mmol/L   CO2 23 22 - 32 mmol/L   Glucose, Bld 127 (H) 70 - 99 mg/dL   BUN 22 8 - 23 mg/dL   Creatinine, Ser 1.41 (H) 0.44 - 1.00 mg/dL   Calcium 8.8 (L) 8.9 - 10.3 mg/dL   GFR calc non Af Amer 34 (L) >60 mL/min   GFR calc Af Amer 39 (L) >60 mL/min    Comment: (NOTE) The eGFR has been calculated using the CKD EPI equation. This calculation has not been validated in all clinical situations. eGFR's persistently <60 mL/min signify possible Chronic Kidney Disease.    Anion gap 14 5 - 15    Comment: Performed at [facility] [address], [place], [place] [address]  Heparin level (unfractionated)     Status: Abnormal   Collection Time: 08/12/18  7:35 AM  Result Value Ref Range   Heparin Unfractionated 0.14 (L) 0.30 - 0.70 IU/mL    Comment: (NOTE) If heparin results are below expected values, and patient dosage has  been confirmed, suggest follow up testing of antithrombin III levels. Performed at [facility], [place] [address] [place], [place] [address]   CBC     Status: None   Collection Time: 08/12/18  7:35 AM  Result Value Ref Range   WBC 7.8 4.0 - 10.5 K/uL   RBC 4.13 3.87 - 5.11 MIL/uL    Hemoglobin 12.5 12.0 - 15.0 g/dL   HCT 38.3 36.0 - 46.0 %   MCV 92.7 78.0 - 100.0 fL   MCH 30.3 26.0 - 34.0 pg   MCHC 32.6 30.0 - 36.0 g/dL   RDW 13.7 11.5 - 15.5 %   Platelets 169 150 - 400 K/uL    Comment: Performed at [facility], [place] [address], [place], [place] [address]  Brain natriuretic peptide     Status: None   Collection Time: 08/12/18  7:35 AM  Result Value Ref Range   B Natriuretic Peptide 97.5 0.0 - 100.0 pg/mL    Comment: Performed at [facility] [address], [place], [place] [address]  Glucose, capillary     Status: Abnormal   Collection Time: 08/12/18 12:11 PM  Result Value Ref Range   Glucose-Capillary 199 (H) 70 - 99 mg/dL  Heparin level (unfractionated)     Status: None   Collection Time: 08/12/18  7:55 PM  Result Value Ref Range   Heparin Unfractionated 0.30 0.30 - 0.70 IU/mL    Comment: (NOTE) If heparin results are below expected values, and patient dosage has  been confirmed, suggest follow up testing of antithrombin III levels. Performed at [facility], [place] [address], [place], [place] [address]   Basic metabolic panel     Status: Abnormal   Collection Time: 08/13/18  2:26 AM  Result Value Ref Range   Sodium 139 135 - 145 mmol/L   Potassium 3.8 3.5 - 5.1 mmol/L   Chloride 109 98 - 111 mmol/L   CO2 20 (L) 22 - 32 mmol/L   Glucose, Bld 156 (H) 70 - 99 mg/dL   BUN 22 8 - 23 mg/dL   Creatinine, Ser 1.31 (H) 0.44 -  1.00 mg/dL   Calcium 8.5 (L) 8.9 - 10.3 mg/dL   GFR calc non Af Amer 37 (L) >60 mL/min   GFR calc Af Amer 43 (L) >60 mL/min    Comment: (NOTE) The eGFR has been calculated using the CKD EPI equation. This calculation has not been validated in all clinical situations. eGFR's persistently <60 mL/min signify possible Chronic Kidney Disease.    Anion gap 10 5 - 15    Comment: Performed at [facility] [address] [place], [place] [address]  Heparin level (unfractionated)     Status: None   Collection Time:  08/13/18  2:26 AM  Result Value Ref Range   Heparin Unfractionated 0.30 0.30 - 0.70 IU/mL    Comment: (NOTE) If heparin results are below expected values, and patient dosage has  been confirmed, suggest follow up testing of antithrombin III levels. Performed at [facility], [place] [address] [place] [address]   CBC     Status: None   Collection Time: 08/13/18  2:26 AM  Result Value Ref Range   WBC 7.1 4.0 - 10.5 K/uL   RBC 4.01 3.87 - 5.11 MIL/uL   Hemoglobin 12.0 12.0 - 15.0 g/dL   HCT 37.4 36.0 - 46.0 %   MCV 93.3 78.0 - 100.0 fL   MCH 29.9 26.0 - 34.0 pg   MCHC 32.1 30.0 - 36.0 g/dL   RDW 13.7 11.5 - 15.5 %   Platelets 158 150 - 400 K/uL    Comment: Performed at [facility], [place] [address], [place], [place] [address]    ECG   Sinus brady at 45, RBBB, 1st degree AVB - Personally Reviewed  Telemetry   Sinus rhythm- Personally Reviewed  Radiology    Dg Chest 1 View  Result Date: 08/11/2018 CLINICAL DATA:  Shortness of breath for several weeks with cough EXAM: CHEST  1 VIEW COMPARISON:  09/01/2016 FINDINGS: Cardiac shadow is stable. Postsurgical changes are again seen. The lungs are well aerated bilaterally. No focal infiltrate or sizable effusion is seen. No acute bony abnormality is noted. Chronic changes about the right shoulder joint are again noted. IMPRESSION: No acute abnormality seen. Electronically Signed   By: Inez Catalina M.D.   On: 08/11/2018 16:31   Dg Chest 2 View  Result Date: 08/11/2018 CLINICAL DATA:  Chest pain EXAM: CHEST - 2 VIEW COMPARISON:  08/11/2018 FINDINGS: Post sternotomy changes. No pleural effusion. Patchy opacity at the lingula and left base. Stable cardiomediastinal silhouette with aortic atherosclerosis. No pneumothorax. Degenerative changes of the spine. IMPRESSION: Patchy opacity at the lingula and left base may reflect atelectasis or mild infiltrate. Electronically Signed   By: Donavan Foil M.D.   On: 08/11/2018 17:40     Cardiac Studies   LV EF: 60% -   65%  ------------------------------------------------------------------- History:   PMH:  Chest Pain Evaluation.  Coronary artery disease. Risk factors:  Hypertension.  ------------------------------------------------------------------- Study Conclusions  - Left ventricle: The cavity size was normal. There was mild   concentric hypertrophy. Systolic function was normal. The   estimated ejection fraction was in the range of 60% to 65%. Wall   motion was normal; there were no regional wall motion   abnormalities. There was fusion of early and atrial contributions   to ventricular filling. - Mitral valve: Mildly calcified annulus.  Assessment   Principal Problem:   Orthopnea Active Problems:   Hx of CABG   Right bundle branch block  First degree AV block   Sinus bradycardia by electrocardiogram   Chronic diastolic CHF (congestive heart failure) (HCC)   Chest pain   Plan   1. No further chest pain. Echo is unremarkable. She is breathing well. Adamant about going home. We discussed her elevated troponin and my desire to do her ischemia evaluation while here. I favor stress testing which was the original plan - if high risk, then could proceed with cath since renal function is improving. She is agreeable to this today and has been NPO p MN.  Time Spent Directly with Patient:  I have spent a total of 25 minutes with the patient reviewing hospital notes, telemetry, EKGs, labs and examining the patient as well as establishing an assessment and plan that was discussed personally with the patient.  > 50% of time was spent in direct patient care.  Length of Stay:  LOS: 2 days   Pixie Casino, MD, [facility], [place] Director of the [facility]  [facility] Diplomate of the American Board of Clinical Lipidology Attending Cardiologist  Direct Dial: [phone]  Fax:  [phone]  Website:  [email or web address] 08/13/2018, 8:37 AM

## 2018-08-13 NOTE — Progress Notes (Signed)
Pt chest pain remains 7/10 after nitroglycerin. Dr. Rennis Golden at bedside. Pt in no distress.

## 2018-08-13 NOTE — Progress Notes (Signed)
Pt with C/O chest pain-7/10. Pt in no distress. PA at bedside

## 2018-08-13 NOTE — Progress Notes (Signed)
DAILY PROGRESS NOTE   Patient Name: Sierra Wright Date of Encounter: 08/13/2018  Chief Complaint   No further chest pain  Patient Profile   84 yo female with history of CABG - 16 years ago by Dr. Servando Snare, normal systolic and mild diastolic dysfunction with chronic diastolic CHF and HTN, presented for chest pain and dyspnea and was found to have elevated troponin to 0.068 -> admitted.   Subjective   Creatinine improved overnight. No further chest pain. Echo yesterday showed LVEF 60-65%, no regional wall motion abnormalities and mild mitral annular calcification.   Objective   Vitals:   08/12/18 2114 08/12/18 2334 08/13/18 0241 08/13/18 0751  BP: 131/66 (!) 124/47 (!) 145/59 (!) 159/61  Pulse:  85 80 78  Resp:  (!) 26 (!) 28 14  Temp:  98.8 F (37.1 C) 97.9 F (36.6 C) 97.9 F (36.6 C)  TempSrc:  Oral Oral Oral  SpO2:  95% 97% 96%  Weight:      Height:        Intake/Output Summary (Last 24 hours) at 08/13/2018 0837 Last data filed at 08/13/2018 0300 Gross per 24 hour  Intake 3006.7 ml  Output 1900 ml  Net 1106.7 ml   Filed Weights   08/11/18 1712 08/11/18 1942 08/12/18 0616  Weight: 94.8 kg 97.3 kg 96.8 kg    Physical Exam   General appearance: alert, no distress and moderately obese Neck: no carotid bruit, no JVD and thyroid not enlarged, symmetric, no tenderness/mass/nodules Lungs: diminished breath sounds bibasilar Heart: regular rate and rhythm Abdomen: soft, non-tender; bowel sounds normal; no masses,  no organomegaly Extremities: extremities normal, atraumatic, no cyanosis or edema Pulses: 2+ and symmetric Skin: Skin color, texture, turgor normal. No rashes or lesions Neurologic: Grossly normal Psych: Pleasant  Inpatient Medications    Scheduled Meds: . amLODipine  5 mg Oral Daily  . aspirin EC  81 mg Oral Daily  . benazepril  40 mg Oral Daily  . carbidopa-levodopa  1 tablet Oral QHS  . clopidogrel  75 mg Oral Daily  . furosemide  40 mg Oral  Daily   And  . furosemide  20 mg Oral Q supper  . gabapentin  200 mg Oral QHS  . glimepiride  1 mg Oral Q breakfast  . hydrALAZINE  25 mg Oral TID  . Influenza vac split quadrivalent PF  0.5 mL Intramuscular Tomorrow-1000  . isosorbide mononitrate  30 mg Oral Daily  . levothyroxine  100 mcg Oral QAC breakfast  . metoprolol succinate  25 mg Oral Daily  . potassium chloride SA  20 mEq Oral BID  . sodium chloride flush  3 mL Intravenous Q12H  . tiZANidine  2 mg Oral QHS    Continuous Infusions: . sodium chloride    . sodium chloride 1 mL/kg/hr (08/12/18 0736)  . heparin 1,100 Units/hr (08/13/18 0300)    PRN Meds: sodium chloride, nitroGLYCERIN, sodium chloride flush   Labs   Results for orders placed or performed during the hospital encounter of 08/11/18 (from the past 48 hour(s))  Basic metabolic panel     Status: Abnormal   Collection Time: 08/11/18  5:15 PM  Result Value Ref Range   Sodium 136 135 - 145 mmol/L   Potassium 3.7 3.5 - 5.1 mmol/L   Chloride 101 98 - 111 mmol/L   CO2 17 (L) 22 - 32 mmol/L   Glucose, Bld 222 (H) 70 - 99 mg/dL   BUN 21 8 - 23 mg/dL  Creatinine, Ser 1.39 (H) 0.44 - 1.00 mg/dL   Calcium 9.6 8.9 - 10.3 mg/dL   GFR calc non Af Amer 34 (L) >60 mL/min   GFR calc Af Amer 40 (L) >60 mL/min    Comment: (NOTE) The eGFR has been calculated using the CKD EPI equation. This calculation has not been validated in all clinical situations. eGFR's persistently <60 mL/min signify possible Chronic Kidney Disease.    Anion gap 18 (H) 5 - 15    Comment: Performed at [facility], [place] [address] [place], [place] [address]  CBC     Status: Abnormal   Collection Time: 08/11/18  5:15 PM  Result Value Ref Range   WBC 12.2 (H) 4.0 - 10.5 K/uL   RBC 4.67 3.87 - 5.11 MIL/uL   Hemoglobin 14.3 12.0 - 15.0 g/dL   HCT 43.0 36.0 - 46.0 %   MCV 92.1 78.0 - 100.0 fL   MCH 30.6 26.0 - 34.0 pg   MCHC 33.3 30.0 - 36.0 g/dL   RDW 13.5 11.5 - 15.5 %   Platelets 228  150 - 400 K/uL    Comment: Performed at [facility] [address], [place], [place] [address]  Protime-INR (order if Patient is taking Coumadin / Warfarin)     Status: None   Collection Time: 08/11/18  5:15 PM  Result Value Ref Range   Prothrombin Time 13.2 11.4 - 15.2 seconds   INR 1.01     Comment: Performed at [facility] [address] [place], [place] [address]  I-stat troponin, ED     Status: Abnormal   Collection Time: 08/11/18  5:32 PM  Result Value Ref Range   Troponin i, poc 0.11 (HH) 0.00 - 0.08 ng/mL   Comment NOTIFIED PHYSICIAN    Comment 3            Comment: Due to the release kinetics of cTnI, a negative result within the first hours of the onset of symptoms does not rule out myocardial infarction with certainty. If myocardial infarction is still suspected, repeat the test at appropriate intervals.   Troponin I (q 6hr x 3)     Status: Abnormal   Collection Time: 08/11/18  8:06 PM  Result Value Ref Range   Troponin I 0.26 (HH) <0.03 ng/mL    Comment: CRITICAL RESULT CALLED TO, READ BACK BY AND VERIFIED WITH: MILLER,J RN 08/11/2018 2220 JORDANS Performed at [facility], [place] [address], [place], [place] [address]   CBC     Status: Abnormal   Collection Time: 08/11/18  8:06 PM  Result Value Ref Range   WBC 11.2 (H) 4.0 - 10.5 K/uL   RBC 4.63 3.87 - 5.11 MIL/uL   Hemoglobin 13.9 12.0 - 15.0 g/dL   HCT 42.2 36.0 - 46.0 %   MCV 91.1 78.0 - 100.0 fL   MCH 30.0 26.0 - 34.0 pg   MCHC 32.9 30.0 - 36.0 g/dL   RDW 13.3 11.5 - 15.5 %   Platelets 212 150 - 400 K/uL    Comment: Performed at [facility] [address] [place], [place] [address]  TSH     Status: None   Collection Time: 08/11/18  8:06 PM  Result Value Ref Range   TSH 3.277 0.350 - 4.500 uIU/mL    Comment: Performed by a 3rd Generation assay with a functional sensitivity of <=0.01 uIU/mL. Performed at [facility], [place] [address],  [place], [place] [address]     Hepatic function panel     Status: Abnormal   Collection Time: 08/11/18  8:06 PM  Result Value Ref Range   Total Protein 6.3 (L) 6.5 - 8.1 g/dL   Albumin 3.9 3.5 - 5.0 g/dL   AST 31 15 - 41 U/L   ALT 30 0 - 44 U/L   Alkaline Phosphatase 67 38 - 126 U/L   Total Bilirubin 0.8 0.3 - 1.2 mg/dL   Bilirubin, Direct 0.1 0.0 - 0.2 mg/dL   Indirect Bilirubin 0.7 0.3 - 0.9 mg/dL    Comment: Performed at [facility] [address] [place], [place] [address]  MRSA PCR Screening     Status: None   Collection Time: 08/11/18  9:13 PM  Result Value Ref Range   MRSA by PCR NEGATIVE NEGATIVE    Comment:        The GeneXpert MRSA Assay (FDA approved for NASAL specimens only), is one component of a comprehensive MRSA colonization surveillance program. It is not intended to diagnose MRSA infection nor to guide or monitor treatment for MRSA infections. Performed at [facility], [place] [address] [place], [place] [address]   Troponin I (q 6hr x 3)     Status: Abnormal   Collection Time: 08/12/18  2:36 AM  Result Value Ref Range   Troponin I 0.26 (HH) <0.03 ng/mL    Comment: CRITICAL VALUE NOTED.  VALUE IS CONSISTENT WITH PREVIOUSLY REPORTED AND CALLED VALUE. Performed at [facility], [place] [address], [place], [place] [address]   Lipid panel     Status: Abnormal   Collection Time: 08/12/18  2:36 AM  Result Value Ref Range   Cholesterol 149 0 - 200 mg/dL   Triglycerides 171 (H) <150 mg/dL   HDL 34 (L) >40 mg/dL   Total CHOL/HDL Ratio 4.4 RATIO   VLDL 34 0 - 40 mg/dL   LDL Cholesterol 81 0 - 99 mg/dL    Comment:        Total Cholesterol/HDL:CHD Risk Coronary Heart Disease Risk Table                     Men   Women  1/2 Average Risk   3.4   3.3  Average Risk       5.0   4.4  2 X Average Risk   9.6   7.1  3 X Average Risk  23.4   11.0        Use the calculated Patient Ratio above and the CHD Risk Table to determine the patient's CHD Risk.        ATP III  CLASSIFICATION (LDL):  <100     mg/dL   Optimal  100-129  mg/dL   Near or Above                    Optimal  130-159  mg/dL   Borderline  160-189  mg/dL   High  >190     mg/dL   Very High Performed at [facility] [address], [place], [place] [address]   Troponin I (q 6hr x 3)     Status: Abnormal   Collection Time: 08/12/18  7:35 AM  Result Value Ref Range   Troponin I 0.16 (HH) <0.03 ng/mL    Comment: CRITICAL VALUE NOTED.  VALUE IS CONSISTENT WITH PREVIOUSLY REPORTED AND CALLED VALUE. Performed at [facility], [address] [place], [place] [address]   Basic metabolic panel     Status: Abnormal   Collection Time: 08/12/18  7:35 AM  Result Value Ref Range   Sodium 140 135 - 145 mmol/L   Potassium 3.7 3.5 - 5.1 mmol/L   Chloride 103 98 - 111 mmol/L   CO2 23 22 - 32 mmol/L   Glucose, Bld 127 (H) 70 - 99 mg/dL   BUN 22 8 - 23 mg/dL   Creatinine, Ser 1.41 (H) 0.44 - 1.00 mg/dL   Calcium 8.8 (L) 8.9 - 10.3 mg/dL   GFR calc non Af Amer 34 (L) >60 mL/min   GFR calc Af Amer 39 (L) >60 mL/min    Comment: (NOTE) The eGFR has been calculated using the CKD EPI equation. This calculation has not been validated in all clinical situations. eGFR's persistently <60 mL/min signify possible Chronic Kidney Disease.    Anion gap 14 5 - 15    Comment: Performed at [facility] [address], [place], [place] [address]  Heparin level (unfractionated)     Status: Abnormal   Collection Time: 08/12/18  7:35 AM  Result Value Ref Range   Heparin Unfractionated 0.14 (L) 0.30 - 0.70 IU/mL    Comment: (NOTE) If heparin results are below expected values, and patient dosage has  been confirmed, suggest follow up testing of antithrombin III levels. Performed at [facility], [place] [address], [place], [place] [address]   CBC     Status: None   Collection Time: 08/12/18  7:35 AM  Result Value Ref Range   WBC 7.8 4.0 - 10.5 K/uL   RBC 4.13 3.87 - 5.11 MIL/uL    Hemoglobin 12.5 12.0 - 15.0 g/dL   HCT 38.3 36.0 - 46.0 %   MCV 92.7 78.0 - 100.0 fL   MCH 30.3 26.0 - 34.0 pg   MCHC 32.6 30.0 - 36.0 g/dL   RDW 13.7 11.5 - 15.5 %   Platelets 169 150 - 400 K/uL    Comment: Performed at [facility], [place] [address] [place], [place] [address]  Brain natriuretic peptide     Status: None   Collection Time: 08/12/18  7:35 AM  Result Value Ref Range   B Natriuretic Peptide 97.5 0.0 - 100.0 pg/mL    Comment: Performed at [facility] [address] [place], [place] [address]  Glucose, capillary     Status: Abnormal   Collection Time: 08/12/18 12:11 PM  Result Value Ref Range   Glucose-Capillary 199 (H) 70 - 99 mg/dL  Heparin level (unfractionated)     Status: None   Collection Time: 08/12/18  7:55 PM  Result Value Ref Range   Heparin Unfractionated 0.30 0.30 - 0.70 IU/mL    Comment: (NOTE) If heparin results are below expected values, and patient dosage has  been confirmed, suggest follow up testing of antithrombin III levels. Performed at [facility], [place] [address], [place], [place] [address]   Basic metabolic panel     Status: Abnormal   Collection Time: 08/13/18  2:26 AM  Result Value Ref Range   Sodium 139 135 - 145 mmol/L   Potassium 3.8 3.5 - 5.1 mmol/L   Chloride 109 98 - 111 mmol/L   CO2 20 (L) 22 - 32 mmol/L   Glucose, Bld 156 (H) 70 - 99 mg/dL   BUN 22 8 - 23 mg/dL   Creatinine, Ser 1.31 (H) 0.44 -  1.00 mg/dL   Calcium 8.5 (L) 8.9 - 10.3 mg/dL   GFR calc non Af Amer 37 (L) >60 mL/min   GFR calc Af Amer 43 (L) >60 mL/min    Comment: (NOTE) The eGFR has been calculated using the CKD EPI equation. This calculation has not been validated in all clinical situations. eGFR's persistently <60 mL/min signify possible Chronic Kidney Disease.    Anion gap 10 5 - 15    Comment: Performed at [facility] [address], [place], [place] [address]  Heparin level (unfractionated)     Status: None   Collection Time:  08/13/18  2:26 AM  Result Value Ref Range   Heparin Unfractionated 0.30 0.30 - 0.70 IU/mL    Comment: (NOTE) If heparin results are below expected values, and patient dosage has  been confirmed, suggest follow up testing of antithrombin III levels. Performed at [facility], [place] [address] [place] [address]   CBC     Status: None   Collection Time: 08/13/18  2:26 AM  Result Value Ref Range   WBC 7.1 4.0 - 10.5 K/uL   RBC 4.01 3.87 - 5.11 MIL/uL   Hemoglobin 12.0 12.0 - 15.0 g/dL   HCT 37.4 36.0 - 46.0 %   MCV 93.3 78.0 - 100.0 fL   MCH 29.9 26.0 - 34.0 pg   MCHC 32.1 30.0 - 36.0 g/dL   RDW 13.7 11.5 - 15.5 %   Platelets 158 150 - 400 K/uL    Comment: Performed at [facility], [place] [address], [place], [place] [address]    ECG   Sinus brady at 45, RBBB, 1st degree AVB - Personally Reviewed  Telemetry   Sinus rhythm- Personally Reviewed  Radiology    Dg Chest 1 View  Result Date: 08/11/2018 CLINICAL DATA:  Shortness of breath for several weeks with cough EXAM: CHEST  1 VIEW COMPARISON:  09/01/2016 FINDINGS: Cardiac shadow is stable. Postsurgical changes are again seen. The lungs are well aerated bilaterally. No focal infiltrate or sizable effusion is seen. No acute bony abnormality is noted. Chronic changes about the right shoulder joint are again noted. IMPRESSION: No acute abnormality seen. Electronically Signed   By: Inez Catalina M.D.   On: 08/11/2018 16:31   Dg Chest 2 View  Result Date: 08/11/2018 CLINICAL DATA:  Chest pain EXAM: CHEST - 2 VIEW COMPARISON:  08/11/2018 FINDINGS: Post sternotomy changes. No pleural effusion. Patchy opacity at the lingula and left base. Stable cardiomediastinal silhouette with aortic atherosclerosis. No pneumothorax. Degenerative changes of the spine. IMPRESSION: Patchy opacity at the lingula and left base may reflect atelectasis or mild infiltrate. Electronically Signed   By: Donavan Foil M.D.   On: 08/11/2018 17:40     Cardiac Studies   LV EF: 60% -   65%  ------------------------------------------------------------------- History:   PMH:  Chest Pain Evaluation.  Coronary artery disease. Risk factors:  Hypertension.  ------------------------------------------------------------------- Study Conclusions  - Left ventricle: The cavity size was normal. There was mild   concentric hypertrophy. Systolic function was normal. The   estimated ejection fraction was in the range of 60% to 65%. Wall   motion was normal; there were no regional wall motion   abnormalities. There was fusion of early and atrial contributions   to ventricular filling. - Mitral valve: Mildly calcified annulus.  Assessment   Principal Problem:   Orthopnea Active Problems:   Hx of CABG   Right bundle branch block  First degree AV block   Sinus bradycardia by electrocardiogram   Chronic diastolic CHF (congestive heart failure) (HCC)   Chest pain   Plan   1. No further chest pain. Echo is unremarkable. She is breathing well. Adamant about going home. We discussed her elevated troponin and my desire to do her ischemia evaluation while here. I favor stress testing which was the original plan - if high risk, then could proceed with cath since renal function is improving. She is agreeable to this today and has been NPO p MN.  Time Spent Directly with Patient:  I have spent a total of 25 minutes with the patient reviewing hospital notes, telemetry, EKGs, labs and examining the patient as well as establishing an assessment and plan that was discussed personally with the patient.  > 50% of time was spent in direct patient care.  Length of Stay:  LOS: 2 days   Pixie Casino, MD, [facility], [place] Director of the [facility]  [facility] Diplomate of the American Board of Clinical Lipidology Attending Cardiologist  Direct Dial: [phone]  Fax:  [phone]  Website:  [email or web address] 08/13/2018, 8:37 AM

## 2018-08-13 NOTE — Progress Notes (Signed)
ANTICOAGULATION CONSULT NOTE  Pharmacy Consult for heparin Indication: chest pain/ACS  Allergies  Allergen Reactions  . Statins Other (See Comments)    Causes myalgias  . Amitriptyline Other (See Comments)    Pt. States that it caused it to be suicidal.   . Benadryl [Diphenhydramine Hcl (Sleep)] Other (See Comments)    Makes the patient very "hyper"  . Tylenol [Acetaminophen] Nausea Only  . Welchol [Colesevelam Hcl] Other (See Comments)    Causes dizziness    Patient Measurements: Height: 5\' 4"  (162.6 cm) Weight: 213 lb 6.5 oz (96.8 kg) IBW/kg (Calculated) : 54.7 Heparin Dosing Weight: 77 kg  Vital Signs: Temp: 97.9 F (36.6 C) (09/11 0751) Temp Source: Oral (09/11 0751) BP: 159/61 (09/11 0751) Pulse Rate: 78 (09/11 0751)  Labs: Recent Labs    08/11/18 1715 08/11/18 2006 08/12/18 0236 08/12/18 0735 08/12/18 1955 08/13/18 0226  HGB 14.3 13.9  --  12.5  --  12.0  HCT Sierra.0 42.2  --  38.3  --  37.4  PLT 228 212  --  169  --  158  LABPROT 13.2  --   --   --   --   --   INR 1.01  --   --   --   --   --   HEPARINUNFRC  --   --   --  0.14* 0.30 0.30  CREATININE 1.39*  --   --  1.41*  --  1.31*  TROPONINI  --  0.26* 0.26* 0.16*  --   --     Estimated Creatinine Clearance: 37.4 mL/min (A) (by C-G formula based on SCr of 1.31 mg/dL (H)).   Medical History: Past Medical History:  Diagnosis Date  . Arthritis   . Coronary artery disease 1999   CABG  . Depression   . Diabetes mellitus without complication (HCC)    type II; metformin and Amaryl  . Diabetic neuropathy (HCC)   . History of anemia as a child   . HOH (hard of hearing)   . Hypertension   . Hypothyroidism    synthroid  . PONV (postoperative nausea and vomiting)   . Restless leg syndrome   . Shortness of breath 09/03/12   ECHO- The LA is Moderately Dilated, mild mitral annular calcification. mild pulmonary hypertension, moderate concentric LV hypertrophy. Atrial septum is aneurysmal. Aortic valve  appears to be mildly sclerotic.EF 88%  . Urinary incontinence   . Weakness of both legs     Medications:  Infusions:  . sodium chloride    . sodium chloride 1 mL/kg/hr (08/12/18 0736)  . heparin 1,100 Units/hr (08/13/18 0300)    Assessment: 84 yo Wright admitted with chest pain, elevated troponin.  Pharmacy asked to start IV heparin.  Plan for myoview today. Baseline CBC WNL.  No anticoagulants PTA.  Heparin level 0.3 at goal heparin drip 1100 uts/hr. No bleeding noted.   Goal of Therapy:  Heparin level 0.3-0.7 units/ml Monitor platelets by anticoagulation protocol: Yes   Plan:  Continue  1100 units/hr Daily CBC, HL F/u myoview results  [name] Sauers Pharm.D. CPP, BCPS Clinical Pharmacist [phone] 08/13/2018 10:40 AM

## 2018-08-13 NOTE — Progress Notes (Signed)
Patient continually educated regarding cath site and limiting movement of L arm to prevent bleeding. Patient continues to move arm despite redirection. RN unable to remove TR band at this time due to oozing.

## 2018-08-14 ENCOUNTER — Encounter (HOSPITAL_COMMUNITY): Payer: Self-pay | Admitting: Cardiology

## 2018-08-14 ENCOUNTER — Other Ambulatory Visit: Payer: Self-pay | Admitting: Cardiology

## 2018-08-14 DIAGNOSIS — Z9889 Other specified postprocedural states: Secondary | ICD-10-CM

## 2018-08-14 DIAGNOSIS — I5033 Acute on chronic diastolic (congestive) heart failure: Secondary | ICD-10-CM

## 2018-08-14 HISTORY — DX: Other specified postprocedural states: Z98.890

## 2018-08-14 LAB — BASIC METABOLIC PANEL
ANION GAP: 9 (ref 5–15)
BUN: 18 mg/dL (ref 8–23)
CHLORIDE: 109 mmol/L (ref 98–111)
CO2: 21 mmol/L — ABNORMAL LOW (ref 22–32)
Calcium: 8.4 mg/dL — ABNORMAL LOW (ref 8.9–10.3)
Creatinine, Ser: 1.26 mg/dL — ABNORMAL HIGH (ref 0.44–1.00)
GFR, EST AFRICAN AMERICAN: 45 mL/min — AB (ref >60)
GFR, EST NON AFRICAN AMERICAN: 39 mL/min — AB (ref >60)
Glucose, Bld: 177 mg/dL — ABNORMAL HIGH (ref 70–99)
POTASSIUM: 4.2 mmol/L (ref 3.5–5.1)
Sodium: 139 mmol/L (ref 135–145)

## 2018-08-14 LAB — CBC
HEMATOCRIT: 36.4 % (ref 36.0–46.0)
HEMOGLOBIN: 11.8 g/dL — AB (ref 12.0–15.0)
MCH: 30.3 pg (ref 26.0–34.0)
MCHC: 32.4 g/dL (ref 30.0–36.0)
MCV: 93.6 fL (ref 78.0–100.0)
Platelets: 167 10*3/uL (ref 150–400)
RBC: 3.89 MIL/uL (ref 3.87–5.11)
RDW: 14 % (ref 11.5–15.5)
WBC: 7.6 10*3/uL (ref 4.0–10.5)

## 2018-08-14 MED ORDER — FUROSEMIDE 40 MG PO TABS: 40.0000 mg | ORAL_TABLET | Freq: Two times a day (BID) | ORAL | 6 refills | Status: DC

## 2018-08-14 MED ORDER — ASPIRIN 81 MG PO TBEC: 81.0000 mg | DELAYED_RELEASE_TABLET | Freq: Every day | ORAL | Status: DC

## 2018-08-14 MED ORDER — GABAPENTIN 300 MG PO CAPS: 300.0000 mg | ORAL_CAPSULE | Freq: Every day | ORAL | 3 refills | Status: DC

## 2018-08-14 MED ORDER — METFORMIN HCL 500 MG PO TABS: 500.0000 mg | ORAL_TABLET | Freq: Two times a day (BID) | ORAL | Status: DC

## 2018-08-14 MED ORDER — FUROSEMIDE 40 MG PO TABS
40.0000 mg | ORAL_TABLET | Freq: Two times a day (BID) | ORAL | Status: DC
  Administered 2018-08-14: 40 mg via ORAL
  Filled 2018-08-14: qty 1

## 2018-08-14 MED FILL — Hydralazine HCl Inj 20 MG/ML: INTRAMUSCULAR | Qty: 1 | Status: AC

## 2018-08-14 NOTE — Progress Notes (Signed)
DAILY PROGRESS NOTE   Patient Name: Sierra Wright Date of Encounter: 08/14/2018  Chief Complaint   Feels much better today  Patient Profile   84 yo female with history of CABG - 16 years ago by Dr. Servando Snare, normal systolic and mild diastolic dysfunction with chronic diastolic CHF and HTN, presented for chest pain and dyspnea and was found to have elevated troponin to 0.068 -> admitted.   Subjective   I was present for her stress test and cath yesterday - very unusual presentation of altered mental status, hypotension and ST depression after lexiscan. She appeared aschen and complained of 7/10 chest pain, improved mildly with nitro and morphine. She thought she was dying. Taken urgently to cath - this revealed 2/3 patent bypass grafts. No targets for PCI were noted. LVEDP was mildly elevated, so we elected to give her lasix for her dyspnea. She is 1.5L negative overnight. Creatinine continues to improve. Feels 100% better today - seen up and eating breakfast. Wants to go home.  Objective   Vitals:   08/13/18 2100 08/14/18 0017 08/14/18 0400 08/14/18 0759  BP: (!) 122/53 (!) 106/53 (!) 126/56 (!) 141/53  Pulse: 84 (!) 57 (!) 57 64  Resp: 16 (!) 22 (!) 21 17  Temp:  98.7 F (37.1 C) 98.5 F (36.9 C) 98.2 F (36.8 C)  TempSrc:  Oral Oral Oral  SpO2: 94% 95% 94% 98%  Weight:      Height:        Intake/Output Summary (Last 24 hours) at 08/14/2018 0845 Last data filed at 08/14/2018 0557 Gross per 24 hour  Intake 240 ml  Output 1800 ml  Net -1560 ml   Filed Weights   08/11/18 1712 08/11/18 1942 08/12/18 0616  Weight: 94.8 kg 97.3 kg 96.8 kg    Physical Exam   General appearance: alert, no distress and moderately obese Neck: no carotid bruit, no JVD and thyroid not enlarged, symmetric, no tenderness/mass/nodules Lungs: diminished breath sounds bibasilar Heart: regular rate and rhythm Abdomen: soft, non-tender; bowel sounds normal; no masses,  no  organomegaly Extremities: extremities normal, atraumatic, no cyanosis or edema Pulses: 2+ and symmetric Skin: Skin color, texture, turgor normal. No rashes or lesions Neurologic: Grossly normal Psych: Pleasant  Inpatient Medications    Scheduled Meds: . amLODipine  5 mg Oral Daily  . aspirin EC  81 mg Oral Daily  . benazepril  40 mg Oral Daily  . carbidopa-levodopa  1 tablet Oral QHS  . clopidogrel  75 mg Oral Daily  . furosemide  40 mg Intravenous Daily  . gabapentin  200 mg Oral QHS  . glimepiride  1 mg Oral Q breakfast  . hydrALAZINE  25 mg Oral TID  . Influenza vac split quadrivalent PF  0.5 mL Intramuscular Tomorrow-1000  . isosorbide mononitrate  30 mg Oral Daily  . levothyroxine  100 mcg Oral QAC breakfast  . metoprolol succinate  25 mg Oral Daily  . potassium chloride SA  20 mEq Oral BID  . sodium chloride flush  3 mL Intravenous Q12H  . tiZANidine  2 mg Oral QHS    Continuous Infusions: . sodium chloride Stopped (08/13/18 2005)    PRN Meds: sodium chloride, ALPRAZolam, nitroGLYCERIN, sodium chloride flush   Labs   Results for orders placed or performed during the hospital encounter of 08/11/18 (from the past 48 hour(s))  Glucose, capillary     Status: Abnormal   Collection Time: 08/12/18 12:11 PM  Result Value Ref Range  Glucose-Capillary 199 (H) 70 - 99 mg/dL  Heparin level (unfractionated)     Status: None   Collection Time: 08/12/18  7:55 PM  Result Value Ref Range   Heparin Unfractionated 0.30 0.30 - 0.70 IU/mL    Comment: (NOTE) If heparin results are below expected values, and patient dosage has  been confirmed, suggest follow up testing of antithrombin III levels. Performed at [facility], [place] [address], [place], [place] [address]   Basic metabolic panel     Status: Abnormal   Collection Time: 08/13/18  2:26 AM  Result Value Ref Range   Sodium 139 135 - 145 mmol/L   Potassium 3.8 3.5 - 5.1 mmol/L   Chloride 109 98 - 111 mmol/L   CO2  20 (L) 22 - 32 mmol/L   Glucose, Bld 156 (H) 70 - 99 mg/dL   BUN 22 8 - 23 mg/dL   Creatinine, Ser 1.31 (H) 0.44 - 1.00 mg/dL   Calcium 8.5 (L) 8.9 - 10.3 mg/dL   GFR calc non Af Amer 37 (L) >60 mL/min   GFR calc Af Amer 43 (L) >60 mL/min    Comment: (NOTE) The eGFR has been calculated using the CKD EPI equation. This calculation has not been validated in all clinical situations. eGFR's persistently <60 mL/min signify possible Chronic Kidney Disease.    Anion gap 10 5 - 15    Comment: Performed at [facility] [address] [place], [place] [address]  Heparin level (unfractionated)     Status: None   Collection Time: 08/13/18  2:26 AM  Result Value Ref Range   Heparin Unfractionated 0.30 0.30 - 0.70 IU/mL    Comment: (NOTE) If heparin results are below expected values, and patient dosage has  been confirmed, suggest follow up testing of antithrombin III levels. Performed at [facility], [place] [address] [place] [address]   CBC     Status: None   Collection Time: 08/13/18  2:26 AM  Result Value Ref Range   WBC 7.1 4.0 - 10.5 K/uL   RBC 4.01 3.87 - 5.11 MIL/uL   Hemoglobin 12.0 12.0 - 15.0 g/dL   HCT 37.4 36.0 - 46.0 %   MCV 93.3 78.0 - 100.0 fL   MCH 29.9 26.0 - 34.0 pg   MCHC 32.1 30.0 - 36.0 g/dL   RDW 13.7 11.5 - 15.5 %   Platelets 158 150 - 400 K/uL    Comment: Performed at [facility], [place] [address] [place], [place] [address]  Basic metabolic panel     Status: Abnormal   Collection Time: 08/14/18  3:21 AM  Result Value Ref Range   Sodium 139 135 - 145 mmol/L   Potassium 4.2 3.5 - 5.1 mmol/L   Chloride 109 98 - 111 mmol/L   CO2 21 (L) 22 - 32 mmol/L   Glucose, Bld 177 (H) 70 - 99 mg/dL   BUN 18 8 - 23 mg/dL   Creatinine, Ser 1.26 (H) 0.44 - 1.00 mg/dL   Calcium 8.4 (L) 8.9 - 10.3 mg/dL   GFR calc non Af Amer 39 (L) >60 mL/min   GFR calc Af Amer 45 (L) >60 mL/min    Comment: (NOTE) The eGFR has been calculated using the CKD EPI  equation. This calculation has not been validated in all clinical situations. eGFR's persistently <60 mL/min signify possible Chronic Kidney Disease.    Anion gap 9 5 - 15    Comment:  Performed at [facility], [place] [address] [place], [place] [address]  CBC     Status: Abnormal   Collection Time: 08/14/18  3:21 AM  Result Value Ref Range   WBC 7.6 4.0 - 10.5 K/uL   RBC 3.89 3.87 - 5.11 MIL/uL   Hemoglobin 11.8 (L) 12.0 - 15.0 g/dL   HCT 36.4 36.0 - 46.0 %   MCV 93.6 78.0 - 100.0 fL   MCH 30.3 26.0 - 34.0 pg   MCHC 32.4 30.0 - 36.0 g/dL   RDW 14.0 11.5 - 15.5 %   Platelets 167 150 - 400 K/uL    Comment: Performed at [facility], [place] [address], [place], [place] [address]    ECG   Sinus brady at 45, RBBB, 1st degree AVB - Personally Reviewed  Telemetry   Sinus rhythm- Personally Reviewed  Radiology    Nm Myocar Single W/spect W/wall Motion And Ef  Result Date: 08/13/2018 CLINICAL DATA:  Prior CABG.  Chest pain.  Hypertension.  CHF. EXAM: MYOCARDIAL IMAGING WITH SPECT (REST) TECHNIQUE: Standard myocardial SPECT imaging was performed after resting intravenous injection of Tc-45mtetrofosmin. COMPARISON:  None. FINDINGS: Perfusion: Rest images demonstrate apical thinning versus prior infarct. There is an area of equivocal mild decreased tracer accumulation within the mid anterolateral wall. IMPRESSION: 1. Apical and mid anterolateral possible defects at rest, as above. 2. Stress images not performed. Electronically Signed   By: KAbigail MiyamotoM.D.   On: 08/13/2018 14:22    Cardiac Studies   LV EF: 60% -   65%  ------------------------------------------------------------------- History:   PMH:  Chest Pain Evaluation.  Coronary artery disease. Risk factors:  Hypertension.  ------------------------------------------------------------------- Study Conclusions  - Left ventricle: The cavity size was normal. There was mild   concentric hypertrophy. Systolic  function was normal. The   estimated ejection fraction was in the range of 60% to 65%. Wall   motion was normal; there were no regional wall motion   abnormalities. There was fusion of early and atrial contributions   to ventricular filling. - Mitral valve: Mildly calcified annulus.  Cardiac Catheterization  [facility] CATHETERIZATION  Order# [id number] Reading physician: MBurnell Blanks MD Ordering physician: MBurnell Blanks MD Study date: 08/13/18  Physicians   Panel Physicians Referring Physician Case Authorizing Physician  MBurnell Blanks MD (Primary)    Procedures   LEFT HEART CATH AND CORONARY ANGIOGRAPHY  Conclusion     Ost RCA to Prox RCA lesion is 99% stenosed.  Dist RCA lesion is 100% stenosed.  SVG graft was visualized by angiography and is normal in caliber.  Prox Cx to Mid Cx lesion is 100% stenosed.  Ost 2nd Mrg to 2nd Mrg lesion is 99% stenosed.  SVG graft was visualized by angiography.  Origin lesion is 100% stenosed.  Ost LAD to Prox LAD lesion is 99% stenosed.  Prox LAD lesion is 100% stenosed.  Ost 1st Diag to 1st Diag lesion is 99% stenosed.  LIMA graft was visualized by angiography and is normal in caliber.   1. Severe triple vessel CAD s/p 3V CABG with 2/3 patent bypass grafts 2. Severe stenosis in the proximal LAD followed by total occlusion in the mid LAD. The LIMA is patent to mid LAD.  3. 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 The obtuse marginal branch fills from left to left collaterals. The vein graft to the obtuse marginal branch is chronically occluded.  4. The RCA has  a severe ostial stenosis followed by total occlusion of the mid vessel. The vein graft to the distal RCA is patent.  5. Elevated left ventricular filling pressure  Recommendations: No targets for PCI. The native vessels are chronically occluded. The grafts to the LAD and RCA are patent. The native circumflex and OM  branch are small and fill from left to left collaterals. Continue medical management of CAD. Findings discussed with Dr. Debara Pickett who is her rounding cardiologist.     Assessment   Principal Problem:   Orthopnea Active Problems:   Hx of CABG   Right bundle branch block   First degree AV block   Sinus bradycardia by electrocardiogram   Chronic diastolic CHF (congestive heart failure) (HCC)   Chest pain   Plan   1. No targets for PCI at cath - troponin elevation appears chronic and related to CHF/CKD. Diuresed well overnight - weight was creeping up. Feels much better today. Will make a small increase in her home lasix dose to 40 mg BID (80 mg total) up from 60 mg total. Ok to d/c home today. Follow-up with Dr. Acie Fredrickson.  Time Spent Directly with Patient:  I have spent a total of 15 minutes with the patient reviewing hospital notes, telemetry, EKGs, labs and examining the patient as well as establishing an assessment and plan that was discussed personally with the patient.  > 50% of time was spent in direct patient care.  Length of Stay:  LOS: 3 days   Pixie Casino, MD, [facility], [place] Director of the [facility] Lipid Disorders &  Cardi[facility] Diplomate of the American Board of Clinical Lipidology Attending Cardiologist  Direct Dial: [phone]  Fax: [phone]  Website:  [email or web address] 08/14/2018, 8:45 AM

## 2018-08-14 NOTE — Discharge Instructions (Signed)
Do not take metformin until 08/16/18 - it may interact with cath dye  You do not need echo test or stress test, we completed work up here.  Heart healthy diabetic diet.    Call if any questions.    We increased lasix to 40 mg twice a day.

## 2018-08-14 NOTE — Progress Notes (Signed)
Discussed discharge instructions and medications with patient and patients husband, robert. Follow up appointment set up and discussed. Both verbalized understanding with all questions answered. VSS. Pt discharged home with husband  [facility] Rosalind Guido,RN

## 2018-08-14 NOTE — Discharge Summary (Addendum)
Discharge Summary    Patient ID: Sierra Wright MRN: [id number]; DOB: March 15, 1934  Admit date: 08/11/2018 Discharge date: 08/14/2018  Primary Care Provider: [name], MD  Primary Cardiologist: Kristeen Miss, MD  Primary Electrophysiologist:  None   Discharge Diagnoses    Principal Problem:   Orthopnea Active Problems:   S/P cardiac cath 08/13/18 stable CAD   Hx of CABG   Right bundle branch block   First degree AV block   Sinus bradycardia by electrocardiogram   Chronic diastolic CHF (congestive heart failure) (HCC)   Chest pain   Allergies Allergies  Allergen Reactions  . Statins Other (See Comments)    Causes myalgias  . Amitriptyline Other (See Comments)    Pt. States that it caused it to be suicidal.   . Benadryl [Diphenhydramine Hcl (Sleep)] Other (See Comments)    Makes the patient very "hyper"  . Tylenol [Acetaminophen] Nausea Only  . Welchol [Colesevelam Hcl] Other (See Comments)    Causes dizziness    Diagnostic Studies/Procedures    Cardiac cath 08/13/18  Ost RCA to Prox RCA lesion is 99% stenosed.  Dist RCA lesion is 100% stenosed.  SVG graft was visualized by angiography and is normal in caliber.  Prox Cx to Mid Cx lesion is 100% stenosed.  Ost 2nd Mrg to 2nd Mrg lesion is 99% stenosed.  SVG graft was visualized by angiography.  Origin lesion is 100% stenosed.  Ost LAD to Prox LAD lesion is 99% stenosed.  Prox LAD lesion is 100% stenosed.  Ost 1st Diag to 1st Diag lesion is 99% stenosed.  LIMA graft was visualized by angiography and is normal in caliber.   1. Severe triple vessel CAD s/p 3V CABG with 2/3 patent bypass grafts 2. Severe stenosis in the proximal LAD followed by total occlusion in the mid LAD. The LIMA is patent to mid LAD.  3. 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 The obtuse marginal branch fills from left to left collaterals. The vein graft to the obtuse marginal branch is chronically occluded.  4. The  RCA has a severe ostial stenosis followed by total occlusion of the mid vessel. The vein graft to the distal RCA is patent.  5. Elevated left ventricular filling pressure  Recommendations: No targets for PCI. The native vessels are chronically occluded. The grafts to the LAD and RCA are patent. The native circumflex and OM branch are small and fill from left to left collaterals. Continue medical management of CAD. Findings discussed with Dr. Rennis Golden who is her rounding cardiologist.    _____________   History of Present Illness     84 yo Female with CAD and CABG 1999, and last cath 2012 with patent grafts.  Was seen in office for increased dyspnea and one episode of chest pain at rest.  NTG did help with that.   Other hx of HTN, DM-2, HLD, one episode of PAF but isolated and no recurrence or long term anticoagulation.     At office visit tests ordered including troponin T.  The troponin T was elevated and pt had chest pain at that time.   Pt then sent to ER for further eval.  Plans were nuc study.       Hospital Course     Consultants: none  Pt's serial troponins were 0.26 and 0.16.  Cr was stable and lipids.  Her HR would drop to 40-50s overnight.  SB with 1st degree AVB and RBBB.  She had no further chest pain  but + orthopnea.   She had elevated Cr so plan was for nuc.    With the nuc study she had worsening of ST depression in inf leads.  She developed 7/10 chest pain along with dyspnea.  At that point with abnormal EKG she was taken to cath lab with mild respirator distress.     Cath with patent LIMA and VG to RCA is patent,  VG to 2nd marginal stenosed.  Native vessels stenosed .  Echo this admit with normal EF 60-65%, mild concentric hypertrophy. No targets for PCI.  LVEDP was mildly elevated so lasix added for dyspnea.    Today pt has ben seen and evaluated by Dr. Rennis GoldenHilty.  She was found stable for discharge with lasix increased to 40 mg BID.  Neg 438 no weight.      _____________  Discharge Vitals Blood pressure (!) 141/59, pulse 69, temperature 98.1 F (36.7 C), temperature source Oral, resp. rate (!) 25, height 5\' 4"  (1.626 m), weight 96.8 kg, SpO2 98 %.  Filed Weights   08/11/18 1712 08/11/18 1942 08/12/18 0616  Weight: 94.8 kg 97.3 kg 96.8 kg    Labs & Radiologic Studies    CBC Recent Labs    08/11/18 1254  08/13/18 0226 08/14/18 0321  WBC 10.7   < > 7.1 7.6  NEUTROABS 7.1*  --   --   --   HGB 14.4   < > 12.0 11.8*  HCT 41.3   < > 37.4 36.4  MCV 88   < > 93.3 93.6  PLT 213   < > 158 167   < > = values in this interval not displayed.   Basic Metabolic Panel Recent Labs    16/09/9608/11/19 0226 08/14/18 0321  NA 139 139  K 3.8 4.2  CL 109 109  CO2 20* 21*  GLUCOSE 156* 177*  BUN 22 18  CREATININE 1.31* 1.26*  CALCIUM 8.5* 8.4*   Liver Function Tests Recent Labs    08/11/18 1254 08/11/18 2006  AST 25 31  ALT 28 30  ALKPHOS 78 67  BILITOT 0.7 0.8  PROT 6.4 6.3*  ALBUMIN 4.4 3.9   No results for input(s): LIPASE, AMYLASE in the last 72 hours. Cardiac Enzymes Recent Labs    08/11/18 2006 08/12/18 0236 08/12/18 0735  TROPONINI 0.26* 0.26* 0.16*   BNP Invalid input(s): POCBNP D-Dimer No results for input(s): DDIMER in the last 72 hours. Hemoglobin A1C No results for input(s): HGBA1C in the last 72 hours. Fasting Lipid Panel Recent Labs    08/12/18 0236  CHOL 149  HDL 34*  LDLCALC 81  TRIG [id number]*  CHOLHDL 4.4   Thyroid Function Tests Recent Labs    08/11/18 2006  TSH 3.277   _____________  Dg Chest 1 View  Result Date: 08/11/2018 CLINICAL DATA:  Shortness of breath for several weeks with cough EXAM: CHEST  1 VIEW COMPARISON:  09/01/2016 FINDINGS: Cardiac shadow is stable. Postsurgical changes are again seen. The lungs are well aerated bilaterally. No focal infiltrate or sizable effusion is seen. No acute bony abnormality is noted. Chronic changes about the right shoulder joint are again noted.  IMPRESSION: No acute abnormality seen. Electronically Signed   By: Alcide CleverMark  Lukens M.D.   On: 08/11/2018 16:31   Dg Chest 2 View  Result Date: 08/11/2018 CLINICAL DATA:  Chest pain EXAM: CHEST - 2 VIEW COMPARISON:  08/11/2018 FINDINGS: Post sternotomy changes. No pleural effusion. Patchy opacity at the lingula and left base. Stable  cardiomediastinal silhouette with aortic atherosclerosis. No pneumothorax. Degenerative changes of the spine. IMPRESSION: Patchy opacity at the lingula and left base may reflect atelectasis or mild infiltrate. Electronically Signed   By: Jasmine Pang M.D.   On: 08/11/2018 17:40   Nm Myocar Single W/spect W/wall Motion And Ef  Result Date: 08/13/2018 CLINICAL DATA:  Prior CABG.  Chest pain.  Hypertension.  CHF. EXAM: MYOCARDIAL IMAGING WITH SPECT (REST) TECHNIQUE: Standard myocardial SPECT imaging was performed after resting intravenous injection of Tc-30m tetrofosmin. COMPARISON:  None. FINDINGS: Perfusion: Rest images demonstrate apical thinning versus prior infarct. There is an area of equivocal mild decreased tracer accumulation within the mid anterolateral wall. IMPRESSION: 1. Apical and mid anterolateral possible defects at rest, as above. 2. Stress images not performed. Electronically Signed   By: Jeronimo Greaves M.D.   On: 08/13/2018 14:22   Disposition   Pt is being discharged home today in good condition.  Follow-up Plans & Appointments   Do not take metformin until 08/16/18 - it may interact with cath dye  You do not need echo test or stress test, we completed work up here.  Heart healthy diabetic diet.    Call if any questions.   Follow-up Information    [name], Deloris Ping, MD Follow up on 09/05/2018.   Specialty:  Cardiology Why:  at 10:15 AM with his PA Vin.   Contact information: [address] [place] [address] [phone]            Discharge Medications   Allergies as of 08/14/2018      Reactions   Statins Other (See  Comments)   Causes myalgias   Amitriptyline Other (See Comments)   Pt. States that it caused it to be suicidal.    Benadryl [diphenhydramine Hcl (sleep)] Other (See Comments)   Makes the patient very "hyper"   Tylenol [acetaminophen] Nausea Only   Welchol [colesevelam Hcl] Other (See Comments)   Causes dizziness      Medication List    TAKE these medications   amLODipine 5 MG tablet Commonly known as:  NORVASC TAKE 1 TABLET BY MOUTH ONCE DAILY   aspirin 81 MG EC tablet Take 1 tablet (81 mg total) by mouth daily. Start taking on:  08/15/2018   benazepril 40 MG tablet Commonly known as:  LOTENSIN TAKE 1 TABLET BY MOUTH ONCE DAILY   CALCIUM 600/VITAMIN D3 600-800 MG-UNIT Tabs Generic drug:  Calcium Carb-Cholecalciferol Take 1 tablet by mouth 2 (two) times daily.   carbidopa-levodopa 25-100 MG tablet Commonly known as:  SINEMET IR Take 1 tablet by mouth at bedtime.   clopidogrel 75 MG tablet Commonly known as:  PLAVIX Take 1 tablet (75 mg total) by mouth daily.   diazepam 10 MG tablet Commonly known as:  VALIUM Take 10 mg by mouth every 6 (six) hours as needed for anxiety.   furosemide 40 MG tablet Commonly known as:  LASIX Take 1 tablet (40 mg total) by mouth 2 (two) times daily. What changed:    medication strength  how much to take  when to take this  Another medication with the same name was removed. Continue taking this medication, and follow the directions you see here.   gabapentin 300 MG capsule Commonly known as:  NEURONTIN Take 1 capsule (300 mg total) by mouth at bedtime.   Garlic 1000 MG Caps Take 1,000 mg by mouth daily.   glimepiride 4 MG tablet Commonly known as:  AMARYL Take 1 mg  by mouth daily with breakfast.   hydrALAZINE 25 MG tablet Commonly known as:  APRESOLINE TAKE 1 TABLET BY MOUTH THREE TIMES DAILY   insulin glargine 100 UNIT/ML injection Commonly known as:  LANTUS Inject 20 Units into the skin daily.   isosorbide  mononitrate 30 MG 24 hr tablet Commonly known as:  IMDUR Take 1 tablet (30 mg total) by mouth daily.   levothyroxine 112 MCG tablet Commonly known as:  SYNTHROID, LEVOTHROID Take 112 mcg by mouth daily before breakfast.   metFORMIN 500 MG tablet Commonly known as:  GLUCOPHAGE Take 1-2 tablets (500-1,000 mg total) by mouth 2 (two) times daily with a meal. Takes 1000 mg with breakfast and 500 mg with dinner Start taking on:  08/16/2018 What changed:  These instructions start on 08/16/2018. If you are unsure what to do until then, ask your doctor or other care provider.   metoprolol succinate 25 MG 24 hr tablet Commonly known as:  TOPROL-XL TAKE 1 TABLET BY MOUTH ONCE DAILY   nitroGLYCERIN 0.4 MG SL tablet Commonly known as:  NITROSTAT Place 1 tablet (0.4 mg total) under the tongue every 5 (five) minutes as needed for chest pain. If need to take more than 3 times (every 5 minutes), please call you doctor.   potassium chloride 10 MEQ tablet Commonly known as:  K-DUR,KLOR-CON Take 10-20 mEq by mouth 2 (two) times daily. Take 10 mg with breakfast and 20 mg with lunch What changed:  Another medication with the same name was removed. Continue taking this medication, and follow the directions you see here.   tizanidine 2 MG capsule Commonly known as:  ZANAFLEX Take 1 capsule (2 mg total) by mouth at bedtime.   vitamin B-12 1000 MCG tablet Commonly known as:  CYANOCOBALAMIN Take 1,000 mcg by mouth daily.   VITAMIN C ER PO Take 1,000 mg by mouth daily.        Acute coronary syndrome (MI, NSTEMI, STEMI, etc) this admission?: No.    Outstanding Labs/Studies   BMP  Duration of Discharge Encounter   Greater than 30 minutes including physician time.  Signed, Nada Boozer, NP 08/14/2018, 12:36 PM

## 2018-08-19 ENCOUNTER — Encounter (HOSPITAL_COMMUNITY): Payer: Medicare Other

## 2018-08-19 ENCOUNTER — Other Ambulatory Visit (HOSPITAL_COMMUNITY): Payer: Medicare Other

## 2018-09-05 ENCOUNTER — Ambulatory Visit: Payer: Medicare Other | Admitting: Physician Assistant

## 2018-09-05 ENCOUNTER — Encounter: Payer: Self-pay | Admitting: Physician Assistant

## 2018-09-05 VITALS — BP 144/74 | HR 65 | Ht 64.0 in | Wt 219.4 lb

## 2018-09-05 DIAGNOSIS — I2510 Atherosclerotic heart disease of native coronary artery without angina pectoris: Secondary | ICD-10-CM

## 2018-09-05 DIAGNOSIS — I5032 Chronic diastolic (congestive) heart failure: Secondary | ICD-10-CM | POA: Diagnosis not present

## 2018-09-05 DIAGNOSIS — I10 Essential (primary) hypertension: Secondary | ICD-10-CM | POA: Diagnosis not present

## 2018-09-05 NOTE — Progress Notes (Signed)
Cardiology Office Note    Date:  09/05/2018   ID:  Sierra Wright, DOB [birth year], MRN [id number]  PCP:  [name], MD  Cardiologist:  Dr. Elease Hashimoto  Chief Complaint: Hospital follow up   History of Present Illness:   Sierra Wright is a 84 y.o. female with hx of CAD s/p CABG,  HTN, DM-2, HLD, one episode of PAF but isolated and no recurrence or long term anticoagulation presents for hospital follow up.   Admitted from clinic 08/2018 for chest pain and elevated troponin. Pt's serial troponins were 0.26 and 0.16. Her HR would droped to 40-50s overnight.  SB with 1st degree AVB and RBBB.  Stress test showed worsening of ST depression in inf leads and had recurrent pain. Cath showed  patent LIMA and VG to RCA is patent,  VG to 2nd marginal stenosed.  Native vessels stenosed. No targets for PCI. Echo with normal EF 60-65%, mild concentric hypertrophy. No targets for PCI.  LVEDP was mildly elevated so lasix added for dyspnea.  improved symptoms. Lasix increased from 60 daily to 40mg  BID.   Renal function and electrolytes were stable on 08/23/18.  Here today for follow up.  Compliant with low-sodium diet.  Not checking her weight due to balance issue.  Takes Lasix 40 mg twice daily.  Her breathing and orthopnea has been improved, almost resolved.  Denies recurrent chest pain.  No palpitation, PND, dizziness, syncope or melena.  Past Medical History:  Diagnosis Date  . Arthritis   . Coronary artery disease 1999   CABG  . Depression   . Diabetes mellitus without complication (HCC)    type II; metformin and Amaryl  . Diabetic neuropathy (HCC)   . History of anemia as a child   . HOH (hard of hearing)   . Hypertension   . Hypothyroidism    synthroid  . PONV (postoperative nausea and vomiting)   . Restless leg syndrome   . S/P cardiac cath 08/13/18 stable CAD 08/14/2018  . Shortness of breath 09/03/12   ECHO- The LA is Moderately Dilated, mild mitral annular calcification. mild  pulmonary hypertension, moderate concentric LV hypertrophy. Atrial septum is aneurysmal. Aortic valve appears to be mildly sclerotic.EF 88%  . Urinary incontinence   . Weakness of both legs     Past Surgical History:  Procedure Laterality Date  . ABDOMINAL HYSTERECTOMY    . APPENDECTOMY    . BACK SURGERY    . CARDIAC CATHERIZATION  08/20/03   Widely patent bypass grafts. Normal left ventricular systolic function.  Marland Kitchen CARDIAC CATHETERIZATION    . CHOLECYSTECTOMY    . COLONOSCOPY    . CORONARY ARTERY BYPASS GRAFT    . EYE SURGERY     bilateral cataracts  . LEFT HEART CATH AND CORONARY ANGIOGRAPHY N/A 08/13/2018   Procedure: LEFT HEART CATH AND CORONARY ANGIOGRAPHY;  Surgeon: Kathleene Hazel, MD;  Location: [facility];  Service: Cardiovascular;  Laterality: N/A;  . LUMBAR LAMINECTOMY/DECOMPRESSION MICRODISCECTOMY N/A 07/26/2015   Procedure: BILATERAL Lumbar 3-4 SUBTOTAL HEMILAMINECTOMY WITH LATERAL RECESS DECOMPRESSION;  Surgeon: Kerrin Champagne, MD;  Location: [facility] OR;  Service: Orthopedics;  Laterality: N/A;  . SHOULDER SURGERY    . TUBAL LIGATION      Current Medications: Prior to Admission medications   Medication Sig Start Date End Date Taking? Authorizing Provider  amLODipine (NORVASC) 5 MG tablet TAKE 1 TABLET BY MOUTH ONCE DAILY 07/07/18  Yes [name], Deloris Ping, MD  Ascorbic Acid (VITAMIN  C ER PO) Take 1,000 mg by mouth daily.    Yes [provider]  aspirin EC 81 MG EC tablet Take 1 tablet (81 mg total) by mouth daily. 08/15/18  Yes Leone Brand, NP  benazepril (LOTENSIN) 40 MG tablet TAKE 1 TABLET BY MOUTH ONCE DAILY 07/07/18  Yes [name], Deloris Ping, MD  Calcium Carb-Cholecalciferol (CALCIUM 600/VITAMIN D3) 600-800 MG-UNIT TABS Take 1 tablet by mouth 2 (two) times daily.   Yes [provider]  carbidopa-levodopa (SINEMET IR) 25-100 MG tablet Take 1 tablet by mouth at bedtime. 12/09/17  Yes Patel, Donika K, DO  clopidogrel (PLAVIX) 75 MG tablet Take 1  tablet (75 mg total) by mouth daily. 07/15/16  Yes Rodolph Bong, MD  diazepam (VALIUM) 10 MG tablet Take 10 mg by mouth every 6 (six) hours as needed for anxiety.   Yes [provider]  furosemide (LASIX) 40 MG tablet Take 1 tablet (40 mg total) by mouth 2 (two) times daily. 08/14/18  Yes Leone Brand, NP  gabapentin (NEURONTIN) 300 MG capsule Take 1 capsule (300 mg total) by mouth at bedtime. 08/14/18  Yes Leone Brand, NP  Garlic 1000 MG CAPS Take 1,000 mg by mouth daily.    Yes [provider]  glimepiride (AMARYL) 4 MG tablet Take 1 mg by mouth daily with breakfast.    Yes [provider]  hydrALAZINE (APRESOLINE) 25 MG tablet TAKE 1 TABLET BY MOUTH THREE TIMES DAILY 08/01/18  Yes [name], Deloris Ping, MD  insulin glargine (LANTUS) 100 UNIT/ML injection Inject 20 Units into the skin daily.   Yes [provider]  isosorbide mononitrate (IMDUR) 30 MG 24 hr tablet Take 1 tablet (30 mg total) by mouth daily. 08/11/18 08/06/19 Yes [name], Deloris Ping, MD  levothyroxine (SYNTHROID, LEVOTHROID) 112 MCG tablet Take 112 mcg by mouth daily before breakfast.    Yes [provider]  metFORMIN (GLUCOPHAGE) 500 MG tablet Take 1-2 tablets (500-1,000 mg total) by mouth 2 (two) times daily with a meal. Takes 1000 mg with breakfast and 500 mg with dinner 08/16/18  Yes Leone Brand, NP  metoprolol succinate (TOPROL-XL) 25 MG 24 hr tablet TAKE 1 TABLET BY MOUTH ONCE DAILY 07/07/18  Yes [name], Deloris Ping, MD  nitroGLYCERIN (NITROSTAT) 0.4 MG SL tablet Place 1 tablet (0.4 mg total) under the tongue every 5 (five) minutes as needed for chest pain. If need to take more than 3 times (every 5 minutes), please call you doctor. 09/02/16  Yes Arrien, York Ram, MD  potassium chloride (K-DUR,KLOR-CON) 10 MEQ tablet Take 10-20 mEq by mouth 2 (two) times daily. Take 10 mg with breakfast and 20 mg with lunch   Yes [provider]  tizanidine (ZANAFLEX) 2 MG capsule Take 1  capsule (2 mg total) by mouth at bedtime. 07/26/16  Yes Patel, Donika K, DO  vitamin B-12 (CYANOCOBALAMIN) 1000 MCG tablet Take 1,000 mcg by mouth daily.   Yes [provider]    Allergies:   Cymbalta [duloxetine hcl]; Statins; Amitriptyline; Benadryl [diphenhydramine hcl (sleep)]; Tylenol [acetaminophen]; and Welchol [colesevelam hcl]   Social History   Socioeconomic History  . Marital status: Married    Spouse name: Not on file  . Number of children: Not on file  . Years of education: Not on file  . Highest education level: Not on file  Occupational History  . Not on file  Social Needs  . Financial resource strain: Not on file  . Food insecurity:  Worry: Not on file    Inability: Not on file  . Transportation needs:    Medical: Not on file    Non-medical: Not on file  Tobacco Use  . Smoking status: Never Smoker  . Smokeless tobacco: Never Used  Substance and Sexual Activity  . Alcohol use: No  . Drug use: No  . Sexual activity: Not on file  Lifestyle  . Physical activity:    Days per week: Not on file    Minutes per session: Not on file  . Stress: Not on file  Relationships  . Social connections:    Talks on phone: Not on file    Gets together: Not on file    Attends religious service: Not on file    Active member of club or organization: Not on file    Attends meetings of clubs or organizations: Not on file    Relationship status: Not on file  Other Topics Concern  . Not on file  Social History Narrative   Lives with husband in a 1 story home.  Has 1 daughter.   Education: high school.     Worked as a Futures trader.      Family History:  The patient's family history includes Healthy in her daughter; Heart attack in her father; Heart attack (age of onset: 57) in her brother; Liver cancer in her sister; Other in her mother; Pneumonia in her mother; Stroke in her sister; Stroke (age of onset: 41) in her son.   ROS:   Please see the history of present  illness.    ROS All other systems reviewed and are negative.   PHYSICAL EXAM:   VS:  BP (!) 144/74   Pulse 65   Ht 5\' 4"  (1.626 m)   Wt 99.5 kg   SpO2 99%   BMI 37.66 kg/m    GEN: Well nourished, well developed, in no acute distress  HEENT: normal  Neck: no JVD, carotid bruits, or masses Cardiac: RRR; no murmurs, rubs, or gallops, trace bilateral lower extremity edema  Respiratory:  clear to auscultation bilaterally, normal work of breathing GI: soft, nontender, nondistended, + BS MS: no deformity or atrophy  Skin: warm and dry, no rash Neuro:  Alert and Oriented x 3, Strength and sensation are intact Psych: euthymic mood, full affect  Wt Readings from Last 3 Encounters:  09/05/18 99.5 kg  08/12/18 96.8 kg  08/11/18 98.3 kg      Studies/Labs Reviewed:   EKG:  EKG is not ordered today.    Recent Labs: 08/11/2018: ALT 30; TSH 3.277 08/12/2018: B Natriuretic Peptide 97.5 08/14/2018: BUN 18; Creatinine, Ser 1.26; Hemoglobin 11.8; Platelets 167; Potassium 4.2; Sodium 139   Lipid Panel    Component Value Date/Time   CHOL 149 08/12/2018 0236   CHOL 183 08/11/2018 1254   TRIG 171 (H) 08/12/2018 0236   HDL 34 (L) 08/12/2018 0236   HDL 45 08/11/2018 1254   CHOLHDL 4.4 08/12/2018 0236   VLDL 34 08/12/2018 0236   LDLCALC 81 08/12/2018 0236   LDLCALC 93 08/11/2018 1254    Additional studies/ records that were reviewed today include:   Echocardiogram: 08/2018 Study Conclusions  - Left ventricle: The cavity size was normal. There was mild   concentric hypertrophy. Systolic function was normal. The   estimated ejection fraction was in the range of 60% to 65%. Wall   motion was normal; there were no regional wall motion   abnormalities. There was fusion of early and atrial  contributions   to ventricular filling. - Mitral valve: Mildly calcified annulus.  Cardiac cath 08/13/18  Ost RCA to Prox RCA lesion is 99% stenosed.  Dist RCA lesion is 100% stenosed.  SVG graft  was visualized by angiography and is normal in caliber.  Prox Cx to Mid Cx lesion is 100% stenosed.  Ost 2nd Mrg to 2nd Mrg lesion is 99% stenosed.  SVG graft was visualized by angiography.  Origin lesion is 100% stenosed.  Ost LAD to Prox LAD lesion is 99% stenosed.  Prox LAD lesion is 100% stenosed.  Ost 1st Diag to 1st Diag lesion is 99% stenosed.  LIMA graft was visualized by angiography and is normal in caliber.  1. Severe triple vessel CAD s/p 3V CABG with 2/3 patent bypass grafts 2. Severe stenosis in the proximal LAD followed by total occlusion in the mid LAD. The LIMA is patent to mid LAD.  3. 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 The obtuse marginal branch fills from left to left collaterals. The vein graft to the obtuse marginal branch is chronically occluded.  4. The RCA has a severe ostial stenosis followed by total occlusion of the mid vessel. The vein graft to the distal RCA is patent.  5. Elevated left ventricular filling pressure  Recommendations: No targets for PCI. The native vessels are chronically occluded. The grafts to the LAD and RCA are patent. The native circumflex and OM branch are small and fill from left to left collaterals. Continue medical management of CAD. Findings discussed with Dr. Rennis Golden who is her rounding cardiologist.   ASSESSMENT & PLAN:    1. Chronic diastolic heart failure Volume status relatively stable.  Mild lower extremity edema.  Encourage conservative management with leg elevation, compression stocking and low-sodium diet.  She may take extra Lasix as needed.  Reviewed in detail.  2.  CAD -Patent 2 out of 3 grafts.  Cath as above.  No recurrent chest pain. -Continue aspirin, Plavix, statin and beta-blocker.  3.  Hypertension -Blood pressure relatively stable on current medication.    Medication Adjustments/Labs and Tests Ordered: Current medicines are reviewed at length with the patient today.  Concerns  regarding medicines are outlined above.  Medication changes, Labs and Tests ordered today are listed in the Patient Instructions below. Patient Instructions  Medication Instructions:  Your physician recommends that you continue on your current medications as directed. Please refer to the Current Medication list given to you today.  If you need a refill on your cardiac medications before your next appointment, please call your pharmacy.   Lab work: None ordered  If you have labs (blood work) drawn today and your tests are completely normal, you will receive your results only by: Marland Kitchen MyChart Message (if you have MyChart) OR . A paper copy in the mail If you have any lab test that is abnormal or we need to change your treatment, we will call you to review the results.  Testing/Procedures: None ordered  Follow-Up: At [facility], you and your health needs are our priority.  As part of our continuing mission to provide you with exceptional heart care, we have created designated Provider Care Teams.  These Care Teams include your primary Cardiologist (physician) and Advanced Practice Providers (APPs -  Physician Assistants and Nurse Practitioners) who all work together to provide you with the care you need, when you need it. You will need a follow up appointment in:  4 months.  You may see Kristeen Miss, MD  or one of the following Advanced Practice Providers on your designated Care Team: Tereso Newcomer, PA-C [place], [place] . Berton Bon, NP  Any Other Special Instructions Will Be Listed Below (If Applicable).       Lorelei Pont, [place]  09/05/2018 11:00 AM    [facility] [address] [place], [place], [place]  [address] Phone: [phone]; Fax: [phone]

## 2018-09-05 NOTE — Patient Instructions (Signed)
Medication Instructions:  Your physician recommends that you continue on your current medications as directed. Please refer to the Current Medication list given to you today.  If you need a refill on your cardiac medications before your next appointment, please call your pharmacy.   Lab work: None ordered  If you have labs (blood work) drawn today and your tests are completely normal, you will receive your results only by: . MyChart Message (if you have MyChart) OR . A paper copy in the mail If you have any lab test that is abnormal or we need to change your treatment, we will call you to review the results.  Testing/Procedures: None ordered  Follow-Up: At [facility], you and your health needs are our priority.  As part of our continuing mission to provide you with exceptional heart care, we have created designated Provider Care Teams.  These Care Teams include your primary Cardiologist (physician) and Advanced Practice Providers (APPs -  Physician Assistants and Nurse Practitioners) who all work together to provide you with the care you need, when you need it. You will need a follow up appointment in:  4 months.  You may see Philip [name], MD or one of the following Advanced Practice Providers on your designated Care Team: Scott Weaver, PA-C [place], PA-C . Janine Hammond, NP  Any Other Special Instructions Will Be Listed Below (If Applicable).   

## 2018-10-02 ENCOUNTER — Ambulatory Visit: Payer: Medicare Other | Admitting: Cardiovascular Disease

## 2018-11-27 ENCOUNTER — Other Ambulatory Visit: Payer: Self-pay | Admitting: Neurology

## 2018-12-04 ENCOUNTER — Other Ambulatory Visit: Payer: Self-pay | Admitting: Neurology

## 2018-12-12 ENCOUNTER — Other Ambulatory Visit: Payer: Self-pay | Admitting: Neurology

## 2018-12-12 ENCOUNTER — Telehealth: Payer: Self-pay | Admitting: Neurology

## 2018-12-12 MED ORDER — CARBIDOPA-LEVODOPA 25-100 MG PO TABS: 1.0000 | ORAL_TABLET | Freq: Every day | ORAL | 0 refills | Status: DC

## 2018-12-12 NOTE — Telephone Encounter (Signed)
Carbidopa levodopa refill to the [facility] on [address]. Please send.

## 2018-12-12 NOTE — Telephone Encounter (Signed)
Patient is scheduled for 1/20 at 1:30. Thanks!

## 2018-12-12 NOTE — Telephone Encounter (Signed)
Patient needs to make a follow up appt.

## 2018-12-12 NOTE — Telephone Encounter (Signed)
One refill sent 

## 2018-12-19 NOTE — Progress Notes (Signed)
Follow-up Visit   Date: 12/22/18    Sierra Wright MRN: [id number] DOB: May 24, 1934   Interim History: Sierra Wright is a 85 y.o. right-handed Caucasian female with diabetes mellitus, hypertension, hyperlipidemia, hypothyroidism, CAD s/p CABG, depression/anxiety, CKD stage III, hearing deficit, and recent R thalamic ischemic stroke (no residual deficits) returning to the clinic for follow-up of RLS and neuropathy.  The patient was accompanied to the clinic by husband who also provides collateral information.    History of present illness: She reports having a long history RLS. She has tried requip and Lyrica but this did not help.   She has numbness of the legs which is more noticeable at nighttime.  She does not have burning or tingling pain.  She has previously tried trazadone, but developed headaches.  She has been using a walker for since ~2015.    She also complains of entire right hand numbness which is worse at night.  There is no weakness or neck pain.   In the interim, she was hospitalized on 8/11 for low back pain after a fall she suffered while getting out of bed to use the restroom at 3am.  Her plain film imaging did not show any fracture, however, because of concern of facial droop and double vision and gait difficulty MRI brian was obtained and showed a subacute right thalamic ischemic infarct, likely due to small vessel disease.  US carotids and TEE was unremarkable.  Telemetry showed second degree AV block with bradycardia, so her betablocker was reduced to Lopressor 12.5mg  BID and out-patient follow-up was recommended.  Her aspirin 81mg  was switched to plavix 75mg  daily.  She is not on statin therapy de to statin allergy.  UPDATE 12/22/2018:  She is here for 1 year appointment.  She is here for refills for carbidopa-levadopa, which controls her RLS.  She has been out of the medication for a month and has noticed that her symptoms are worse.  She continues to have numbness  of the feet.  She has not had any falls, mostly because she is confined to her wheelchair.  She is able to stand and transfer and does her own ADLs.  Her diabetes is doing fair, she was taken off metformin and has noticed that blood glucose is trending up.  She is mostly bothered by RUQ abdominal pain which has been ongoing for the past 2 years and followed by her PCP.    Medications:  Current Outpatient Medications on File Prior to Visit  Medication Sig Dispense Refill  . amLODipine (NORVASC) 5 MG tablet TAKE 1 TABLET BY MOUTH ONCE DAILY 90 tablet 2  . Ascorbic Acid (VITAMIN C ER PO) Take 1,000 mg by mouth daily.     Marland Kitchen aspirin EC 81 MG EC tablet Take 1 tablet (81 mg total) by mouth daily.    . benazepril (LOTENSIN) 40 MG tablet TAKE 1 TABLET BY MOUTH ONCE DAILY 90 tablet 2  . Calcium Carb-Cholecalciferol (CALCIUM 600/VITAMIN D3) 600-800 MG-UNIT TABS Take 1 tablet by mouth 2 (two) times daily.    . clopidogrel (PLAVIX) 75 MG tablet Take 1 tablet (75 mg total) by mouth daily. 30 tablet 3  . diazepam (VALIUM) 10 MG tablet Take 10 mg by mouth every 6 (six) hours as needed for anxiety.    . furosemide (LASIX) 40 MG tablet Take 1 tablet (40 mg total) by mouth 2 (two) times daily. 60 tablet 6  . gabapentin (NEURONTIN) 100 MG capsule Take 200 mg by  mouth at bedtime.    . Garlic 1000 MG CAPS Take 1,000 mg by mouth daily.     Marland Kitchen. glimepiride (AMARYL) 1 MG tablet Take 1 mg by mouth daily with breakfast.    . hydrALAZINE (APRESOLINE) 25 MG tablet TAKE 1 TABLET BY MOUTH THREE TIMES DAILY 270 tablet 2  . insulin glargine (LANTUS) 100 UNIT/ML injection Inject 20 Units into the skin daily.    . isosorbide mononitrate (IMDUR) 30 MG 24 hr tablet Take 1 tablet (30 mg total) by mouth daily. 90 tablet 3  . levothyroxine (SYNTHROID, LEVOTHROID) 100 MCG tablet Take 100 mcg by mouth daily before breakfast.    . metoprolol succinate (TOPROL-XL) 25 MG 24 hr tablet TAKE 1 TABLET BY MOUTH ONCE DAILY 90 tablet 2  .  nitroGLYCERIN (NITROSTAT) 0.4 MG SL tablet Place 1 tablet (0.4 mg total) under the tongue every 5 (five) minutes as needed for chest pain. If need to take more than 3 times (every 5 minutes), please call you doctor. 20 tablet 0  . potassium chloride (K-DUR,KLOR-CON) 10 MEQ tablet Take 10-20 mEq by mouth 2 (two) times daily. Take 10 mg with breakfast and 20 mg with lunch    . tiZANidine (ZANAFLEX) 4 MG tablet Take 4 mg by mouth at bedtime.    . vitamin B-12 (CYANOCOBALAMIN) 1000 MCG tablet Take 1,000 mcg by mouth daily.     No current facility-administered medications on file prior to visit.     Allergies:  Allergies  Allergen Reactions  . Cymbalta [Duloxetine Hcl] Nausea And Vomiting  . Statins Other (See Comments)    Causes myalgias  . Amitriptyline Other (See Comments)    Pt. States that it caused it to be suicidal.   . Benadryl [Diphenhydramine Hcl (Sleep)] Other (See Comments)    Makes the patient very "hyper"  . Tylenol [Acetaminophen] Nausea Only  . Welchol [Colesevelam Hcl] Other (See Comments)    Causes dizziness    Review of Systems:  CONSTITUTIONAL: No fevers, chills, night sweats, or weight loss.  EYES: No visual changes or eye pain ENT: No hearing changes.  No history of nose bleeds.   RESPIRATORY: No cough, wheezing and shortness of breath.   CARDIOVASCULAR: Negative for chest pain, and palpitations.   GI: Negative for abdominal discomfort, blood in stools or black stools.  No recent change in bowel habits.   GU:  No history of incontinence.   MUSCLOSKELETAL: +history of joint pain or swelling.  No myalgias.   SKIN: Negative for lesions, rash, and itching.   ENDOCRINE: Negative for cold or heat intolerance, polydipsia or goiter.   PSYCH:  + depression or anxiety symptoms.   NEURO: As Above.   Vital Signs:  BP 110/80   Pulse 98   Ht 5\' 4"  (1.626 m)   Wt 209 lb (94.8 kg)   SpO2 96%   BMI 35.87 kg/m    General Medical Exam:   General:  Elderly appearing,  comfortable  Eyes/ENT: see cranial nerve examination.   Neck:   No carotid bruits. Respiratory:  Clear to auscultation, good air entry bilaterally.   Cardiac:  Regular rate and rhythm, no murmur.   Ext:  No edema  Neurological Exam: MENTAL STATUS including orientation to time, place, person, recent and remote memory, attention span and concentration, language, and fund of knowledge is normal.  Speech is not dysarthric.  CRANIAL NERVES:   Bilateral surgical pupils. Normal conjugate, extra-ocular eye movements in all directions of gaze.  No ptosis.  Face is symmetric.  MOTOR:  Motor strength is 5-/5 in all extremities, mild give-way weakness in the legs.   No pronator drift.  Tone is normal.    MSRs:  Reflexes are 2+/4 in the upper extremities and absent in the legs.  SENSORY:  Absent vibration distal to knees bilaterally, intact at the MCPs  COORDINATION/GAIT:  Gait not tested due to being wheelchair bound  Data: MRA brain and neck 07/15/2016: Mild atherosclerotic disease in the carotid bulb with 30% diameter stenosis on the right and 25% diameter stenosis on the left  Moderate stenosis distal left vertebral artery due to atherosclerotic disease.  Moderate intracranial atherosclerotic disease as described above. MRI brain 07/13/2016: 1. 9 mm early subacute ischemic nonhemorrhagic infarct involving the medial right thalamus. No associated mass effect. 2. No other acute intracranial process identified. 3. Mild age-related cerebral atrophy with chronic small vessel ischemic disease.  CT cervical spine 07/13/2016: 1. No acute intracranial abnormality or significant interval change. 2. Stable atrophy and white matter disease. This likely reflects the sequela of chronic microvascular ischemia. 3. Moderate spondylosis in the cervical spine as described. 4. No acute fracture or traumatic subluxation in the cervical spine.  CT head 07/13/2016: 1. No acute intracranial abnormality or  significant interval change. 2. Stable atrophy and white matter disease. This likely reflects the sequela of chronic microvascular ischemia. 3. Moderate spondylosis in the cervical spine as described. 4. No acute fracture or traumatic subluxation in the cervical spine.  US carotids 1-39% bilaterally Echo: Normal   IMPRESSION/PLAN: 1.  Diabetic polyneuropathy affecting the feet  - Continue gabapentin 200mg  at bedtime.  No further titrated due to CKD  2.  Restless leg syndrome, well controlled on sinemet 25/100mg  at bedtime.  Refills provided.  3.  Right hand paresthesias, ?ulnar neuropathy vs CTS - improved with repositioning.   4.  Right thalamic stroke due to small vessel disease, no residual deficits (07/2016).  Continue plavix 75mg  daily.  She has statin allergy  5.  RUQ pain, follow-up with PCP  Return to clinic in 1 year   Thank you for allowing me to participate in patient's care.  If I can answer any additional questions, I would be pleased to do so.    Sincerely,     K. Allena Katz, DO

## 2018-12-22 ENCOUNTER — Ambulatory Visit: Payer: Medicare Other | Admitting: Neurology

## 2018-12-22 ENCOUNTER — Encounter: Payer: Self-pay | Admitting: Neurology

## 2018-12-22 VITALS — BP 110/80 | HR 98 | Ht 64.0 in | Wt 209.0 lb

## 2018-12-22 DIAGNOSIS — E0842 Diabetes mellitus due to underlying condition with diabetic polyneuropathy: Secondary | ICD-10-CM

## 2018-12-22 DIAGNOSIS — G2581 Restless legs syndrome: Secondary | ICD-10-CM

## 2018-12-22 DIAGNOSIS — G5621 Lesion of ulnar nerve, right upper limb: Secondary | ICD-10-CM

## 2018-12-22 MED ORDER — CARBIDOPA-LEVODOPA 25-100 MG PO TABS: 1.0000 | ORAL_TABLET | Freq: Every day | ORAL | 3 refills | Status: DC

## 2018-12-22 NOTE — Patient Instructions (Signed)
Return to clinic in 1 year.

## 2019-01-12 ENCOUNTER — Ambulatory Visit: Payer: Medicare Other | Admitting: Cardiovascular Disease

## 2019-01-12 ENCOUNTER — Encounter: Payer: Self-pay | Admitting: Cardiovascular Disease

## 2019-01-12 VITALS — BP 126/76 | HR 70 | Ht 64.0 in | Wt 212.4 lb

## 2019-01-12 DIAGNOSIS — I5032 Chronic diastolic (congestive) heart failure: Secondary | ICD-10-CM

## 2019-01-12 DIAGNOSIS — I25810 Atherosclerosis of coronary artery bypass graft(s) without angina pectoris: Secondary | ICD-10-CM

## 2019-01-12 MED ORDER — NITROGLYCERIN 0.4 MG SL SUBL: 0.4000 mg | SUBLINGUAL_TABLET | SUBLINGUAL | 6 refills | Status: DC | PRN

## 2019-01-12 NOTE — Progress Notes (Signed)
Cardiology Office Note   Date:  01/12/2019   ID:  Sierra Wright, DOB 29-Feb-1934, MRN [id number]  PCP:  [name], MD  Cardiologist: Cassell Clement MD  --> Cate Oravec   Chief Complaint  Patient presents with  . Coronary Artery Disease     Problem list 1. Coronary artery disease-status post coronary artery bypass grafting 2. Essential hypertension 3.  Chronic diastolic congestive heart failure  Sierra Wright is a 85 y.o. female who presents for a six-month office visit This pleasant 85 year old woman is seen for a followup office visit. Her PCP is Dr. [name]. The patient has a history of known ischemic heart disease. She had successful CABG 16 years ago by Dr. [name]. She has not been experiencing any subsequent chest pain or angina. She notes occasional shortness of breath.  Her last echocardiogram was 03/25/14 and showed an ejection fraction of 65-70% with grade 1 diastolic dysfunction.  She has a history of hypertension and she has a past history of mild fluid retention responding to Lasix. She has intermittent ankle edema. She has occasional mild dizzy spells but no syncope. She has not been experiencing any palpitations or tachycardia. She is relatively sedentary because of significant osteoarthritis of her knees. She arrived in our office today by wheelchair. She sleeps with one pillow under her head and also a pillow under her feet to help with her mild dependent edema. During the day she wears compression hose. She does not have any history of TIA or stroke. She does not have any history of peptic ulcer disease or GI bleeding. She has a history of intolerance to cholesterol lowering drugs including statins, ezetimibe, and Welchol. Family history is positive for ischemic heart disease. Her father died at 22 and a brother died at 68 of heart attacks.  At her last visit we decreased her beta blocker because of bradycardia and we increased her hydralazine because of  systolic hypertension. Since then her blood pressure has been satisfactory. The patient has not been having any chest pain.  She has had no recurrence of the paroxysmal atrial flutter fibrillation which occurred in August 2016 immediately postoperative. she does get short of breath with minimal housework activities.  Her peripheral edema has resolved.  EKG today shows normal sinus rhythm.  Apr 16, 2016:  Pt is seen today .   Recent transfer from Dr. [name].  Has hx of CAD, chronic diastolic CHF,  Essential HTN. No cardiac issues. Is in a wheelchair ,   Uses a walker at home  Avoids salt .   10/04/2016:   Sierra Wright is seen today for follow-up visit. Is very short of breath today since her hospitalization one month ago. She has been in the hospital twice since I last saw her.   She was admitted in October and was seen by Dr. Antoine Poche  on October 1. She had presented with episodes of chest pain. Troponin levels were negative. He recommended an outpatient Lexiscan  Myoview study.  Has not had the stress myoview yet   Aug. 14, 2018:  Sierra Wright is here to follow-up for her  CAD and shortness of breath.  She had a stress Myoview study in November, 2017 which was normal. She has no ischemia.  Left ventricular ejection fraction 60%.  August 11, 2018: Sierra Wright is seen as a work in visit .  She has been having worsening dyspnea.  More shortness of breath recently  Has a dry cough  Has not seen  her primary MD recently Has a headache. Is dizzy.    Has a choking sensation when she lies down.   Had CP on one occasion when she was choking SL NTG helped  This was 6 weeks ago  Has had a chronic headache for weeks .  These symptoms do not feel like her symptoms prior to her CABG .  Had a false positive myoview in 2004.   January 12, 2019:  Sierra Wright seen back today for follow-up visit.  When I saw her previously in September, 2019 she was having symptoms that were concerning for unstable  angina. She had a mildly elevated troponin level and we sent her over to the hospital to be admitted.  Heart catheterization revealed three-vessel coronary artery disease.  2 of the 3 of her bypass grafts were patent. No targets for PCI.  The patient was treated medically.  Had lots of CP,  Had severe CP   Took 2 SL NTG with eventual relief .    Past Medical History:  Diagnosis Date  . Arthritis   . Coronary artery disease 1999   CABG  . Depression   . Diabetes mellitus without complication (HCC)    type II; metformin and Amaryl  . Diabetic neuropathy (HCC)   . History of anemia as a child   . HOH (hard of hearing)   . Hypertension   . Hypothyroidism    synthroid  . PONV (postoperative nausea and vomiting)   . Restless leg syndrome   . S/P cardiac cath 08/13/18 stable CAD 08/14/2018  . Shortness of breath 09/03/12   ECHO- The LA is Moderately Dilated, mild mitral annular calcification. mild pulmonary hypertension, moderate concentric LV hypertrophy. Atrial septum is aneurysmal. Aortic valve appears to be mildly sclerotic.EF 88%  . Urinary incontinence   . Weakness of both legs     Past Surgical History:  Procedure Laterality Date  . ABDOMINAL HYSTERECTOMY    . APPENDECTOMY    . BACK SURGERY    . CARDIAC CATHERIZATION  08/20/03   Widely patent bypass grafts. Normal left ventricular systolic function.  Marland Kitchen CARDIAC CATHETERIZATION    . CHOLECYSTECTOMY    . COLONOSCOPY    . CORONARY ARTERY BYPASS GRAFT    . EYE SURGERY     bilateral cataracts  . LEFT HEART CATH AND CORONARY ANGIOGRAPHY N/A 08/13/2018   Procedure: LEFT HEART CATH AND CORONARY ANGIOGRAPHY;  Surgeon: Kathleene Hazel, MD;  Location: [facility];  Service: Cardiovascular;  Laterality: N/A;  . LUMBAR LAMINECTOMY/DECOMPRESSION MICRODISCECTOMY N/A 07/26/2015   Procedure: BILATERAL Lumbar 3-4 SUBTOTAL HEMILAMINECTOMY WITH LATERAL RECESS DECOMPRESSION;  Surgeon: Kerrin Champagne, MD;  Location: [facility] OR;  Service:  Orthopedics;  Laterality: N/A;  . SHOULDER SURGERY    . TUBAL LIGATION       Current Outpatient Medications  Medication Sig Dispense Refill  . amLODipine (NORVASC) 5 MG tablet TAKE 1 TABLET BY MOUTH ONCE DAILY 90 tablet 2  . Ascorbic Acid (VITAMIN C ER PO) Take 1,000 mg by mouth daily.     Marland Kitchen aspirin EC 81 MG EC tablet Take 1 tablet (81 mg total) by mouth daily.    . benazepril (LOTENSIN) 40 MG tablet TAKE 1 TABLET BY MOUTH ONCE DAILY 90 tablet 2  . Calcium Carb-Cholecalciferol (CALCIUM 600/VITAMIN D3) 600-800 MG-UNIT TABS Take 1 tablet by mouth 2 (two) times daily.    . carbidopa-levodopa (SINEMET IR) 25-100 MG tablet Take 1 tablet by mouth at bedtime. 90 tablet 3  .  clopidogrel (PLAVIX) 75 MG tablet Take 1 tablet (75 mg total) by mouth daily. 30 tablet 3  . diazepam (VALIUM) 10 MG tablet Take 10 mg by mouth every 6 (six) hours as needed for anxiety.    . furosemide (LASIX) 40 MG tablet Take 1 tablet (40 mg total) by mouth 2 (two) times daily. 60 tablet 6  . gabapentin (NEURONTIN) 100 MG capsule Take 200 mg by mouth at bedtime.    . Garlic 1000 MG CAPS Take 1,000 mg by mouth daily.     Marland Kitchen. glimepiride (AMARYL) 2 MG tablet Take 2 mg by mouth daily with breakfast.    . hydrALAZINE (APRESOLINE) 25 MG tablet TAKE 1 TABLET BY MOUTH THREE TIMES DAILY 270 tablet 2  . insulin glargine (LANTUS) 100 UNIT/ML injection Inject 24 Units into the skin daily.     . isosorbide mononitrate (IMDUR) 30 MG 24 hr tablet Take 1 tablet (30 mg total) by mouth daily. 90 tablet 3  . levothyroxine (SYNTHROID, LEVOTHROID) 100 MCG tablet Take 100 mcg by mouth daily before breakfast.    . metoprolol succinate (TOPROL-XL) 25 MG 24 hr tablet TAKE 1 TABLET BY MOUTH ONCE DAILY 90 tablet 2  . nitroGLYCERIN (NITROSTAT) 0.4 MG SL tablet Place 1 tablet (0.4 mg total) under the tongue every 5 (five) minutes as needed for chest pain. If need to take more than 3 times (every 5 minutes), please call you doctor. 20 tablet 0  .  potassium chloride (K-DUR,KLOR-CON) 10 MEQ tablet Take 10-20 mEq by mouth 2 (two) times daily. Take 10 mg with breakfast and 20 mg with lunch    . tiZANidine (ZANAFLEX) 4 MG tablet Take 4 mg by mouth at bedtime.    . vitamin B-12 (CYANOCOBALAMIN) 1000 MCG tablet Take 1,000 mcg by mouth daily.     No current facility-administered medications for this visit.     Allergies:   Cymbalta [duloxetine hcl]; Statins; Amitriptyline; Benadryl [diphenhydramine hcl (sleep)]; Tylenol [acetaminophen]; and Welchol [colesevelam hcl]    Social History:  The patient  reports that she has never smoked. She has never used smokeless tobacco. She reports that she does not drink alcohol or use drugs.   Family History:  The patient's family history includes Healthy in her daughter; Heart attack in her father; Heart attack (age of onset: 8056) in her brother; Liver cancer in her sister; Other in her mother; Pneumonia in her mother; Stroke in her sister; Stroke (age of onset: 5256) in her son.    ROS:  Please see the history of present illness.   Otherwise, review of systems are positive for none.   All other systems are reviewed and negative.    Physical Exam: Blood pressure 126/76, pulse 70, height 5\' 4"  (1.626 m), weight 212 lb 6.4 oz (96.3 kg), SpO2 96 %.  GEN:  Well nourished, well developed in no acute distress HEENT: Normal NECK: No JVD; No carotid bruits LYMPHATICS: No lymphadenopathy CARDIAC: RRR , no murmurs, rubs, gallops RESPIRATORY:  Clear to auscultation without rales, wheezing or rhonchi  ABDOMEN: Soft, non-tender, non-distended MUSCULOSKELETAL:  No edema; No deformity  SKIN: Warm and dry NEUROLOGIC:  Alert and oriented x 3    EKG:       Recent Labs: 08/11/2018: ALT 30; TSH 3.277 08/12/2018: B Natriuretic Peptide 97.5 08/14/2018: BUN 18; Creatinine, Ser 1.26; Hemoglobin 11.8; Platelets 167; Potassium 4.2; Sodium 139    Lipid Panel    Component Value Date/Time   CHOL 149 08/12/2018 0236  CHOL 183 08/11/2018 1254   TRIG 171 (H) 08/12/2018 0236   HDL 34 (L) 08/12/2018 0236   HDL 45 08/11/2018 1254   CHOLHDL 4.4 08/12/2018 0236   VLDL 34 08/12/2018 0236   LDLCALC 81 08/12/2018 0236   LDLCALC 93 08/11/2018 1254      Wt Readings from Last 3 Encounters:  01/12/19 212 lb 6.4 oz (96.3 kg)  12/22/18 209 lb (94.8 kg)  09/05/18 219 lb 6.4 oz (99.5 kg)         ASSESSMENT AND PLAN:  1.  CAD: She has a history of coronary artery disease and coronary artery bypass grafting.  She has severe native coronary disease and has 2 out of 3 patent saphenous vein grafts. Chronic pain which is likely chronic angina.  She also has shortness of breath may also be an angina equivalent. We really do not have any further options to revascularize her.  She had no targets for PCI during her last heart catheterization last year.  2. ischemic heart disease status post CABG:.   3. chronic diastolic CHF :     4. hypertensive cardiovascular disease -    BP is well controlled.   5. right bundle branch block,   6.  Paroxysmal atrial flutter fibrillation,        Sierra Miss, MD  01/12/2019 2:50 PM    [facility] [address],  [address] [place], [place]  [address] Pager [phone] Phone: [phone]; Fax: [phone]

## 2019-01-12 NOTE — Patient Instructions (Signed)
Medication Instructions:  Your physician recommends that you continue on your current medications as directed. Please refer to the Current Medication list given to you today. A refill of your Nitroglycerin went to your pharmacy  If you need a refill on your cardiac medications before your next appointment, please call your pharmacy.    Lab work: None Ordered   Testing/Procedures: None Ordered   Follow-Up: At [facility], you and your health needs are our priority.  As part of our continuing mission to provide you with exceptional heart care, we have created designated Provider Care Teams.  These Care Teams include your primary Cardiologist (physician) and Advanced Practice Providers (APPs -  Physician Assistants and Nurse Practitioners) who all work together to provide you with the care you need, when you need it. You will need a follow up appointment in:  6 months.  Please call our office 2 months in advance to schedule this appointment.  You may see Kristeen Miss, MD or one of the following Advanced Practice Providers on your designated Care Team: Tereso Newcomer, PA-C [place], [place] . Berton Bon, NP

## 2019-03-09 ENCOUNTER — Other Ambulatory Visit: Payer: Self-pay | Admitting: Cardiovascular Disease

## 2019-05-06 ENCOUNTER — Other Ambulatory Visit: Payer: Self-pay | Admitting: Cardiovascular Disease

## 2019-05-18 ENCOUNTER — Other Ambulatory Visit: Payer: Self-pay | Admitting: Cardiovascular Disease

## 2019-06-14 ENCOUNTER — Other Ambulatory Visit: Payer: Self-pay | Admitting: Physician Assistant

## 2019-06-18 ENCOUNTER — Other Ambulatory Visit: Payer: Self-pay | Admitting: Physician Assistant

## 2019-06-19 MED ORDER — FUROSEMIDE 40 MG PO TABS: 40.0000 mg | ORAL_TABLET | Freq: Two times a day (BID) | ORAL | 2 refills | Status: DC

## 2019-06-23 ENCOUNTER — Other Ambulatory Visit: Payer: Self-pay | Admitting: Cardiovascular Disease

## 2019-06-23 ENCOUNTER — Other Ambulatory Visit: Payer: Self-pay | Admitting: Physician Assistant

## 2019-06-24 ENCOUNTER — Ambulatory Visit: Payer: Medicare Other | Admitting: Podiatry

## 2019-06-24 NOTE — Telephone Encounter (Signed)
Outpatient Medication Detail   Disp Refills Start End   furosemide (LASIX) 40 MG tablet 180 tablet 2 06/19/2019    Sig - Route: Take 1 tablet (40 mg total) by mouth 2 (two) times daily. - Oral   Sent to pharmacy as: furosemide (LASIX) 40 MG tablet   E-Prescribing Status: Receipt confirmed by pharmacy (06/19/2019 2:05 PM EDT)   Pharmacy  [facility], [place] - [address]

## 2019-07-01 ENCOUNTER — Ambulatory Visit: Payer: Medicare Other | Admitting: Podiatry

## 2019-07-22 ENCOUNTER — Other Ambulatory Visit: Payer: Self-pay

## 2019-07-22 ENCOUNTER — Ambulatory Visit: Payer: Medicare Other | Admitting: Podiatry

## 2019-07-22 ENCOUNTER — Encounter: Payer: Self-pay | Admitting: Podiatry

## 2019-07-22 VITALS — BP 140/70 | HR 72 | Temp 98.5°F

## 2019-07-22 DIAGNOSIS — B351 Tinea unguium: Secondary | ICD-10-CM | POA: Diagnosis not present

## 2019-07-22 DIAGNOSIS — M79675 Pain in left toe(s): Secondary | ICD-10-CM

## 2019-07-22 DIAGNOSIS — M79674 Pain in right toe(s): Secondary | ICD-10-CM

## 2019-07-22 DIAGNOSIS — Z9229 Personal history of other drug therapy: Secondary | ICD-10-CM | POA: Diagnosis not present

## 2019-07-22 DIAGNOSIS — E1142 Type 2 diabetes mellitus with diabetic polyneuropathy: Secondary | ICD-10-CM | POA: Diagnosis not present

## 2019-07-22 NOTE — Patient Instructions (Signed)
Diabetes Mellitus and Foot Care Foot care is an important part of your health, especially when you have diabetes. Diabetes may cause you to have problems because of poor blood flow (circulation) to your feet and legs, which can cause your skin to:  Become thinner and drier.  Break more easily.  Heal more slowly.  Peel and crack. You may also have nerve damage (neuropathy) in your legs and feet, causing decreased feeling in them. This means that you may not notice minor injuries to your feet that could lead to more serious problems. Noticing and addressing any potential problems early is the best way to prevent future foot problems. How to care for your feet Foot hygiene  Wash your feet daily with warm water and mild soap. Do not use hot water. Then, pat your feet and the areas between your toes until they are completely dry. Do not soak your feet as this can dry your skin.  Trim your toenails straight across. Do not dig under them or around the cuticle. File the edges of your nails with an emery board or nail file.  Apply a moisturizing lotion or petroleum jelly to the skin on your feet and to dry, brittle toenails. Use lotion that does not contain alcohol and is unscented. Do not apply lotion between your toes. Shoes and socks  Wear clean socks or stockings every day. Make sure they are not too tight. Do not wear knee-high stockings since they may decrease blood flow to your legs.  Wear shoes that fit properly and have enough cushioning. Always look in your shoes before you put them on to be sure there are no objects inside.  To break in new shoes, wear them for just a few hours a day. This prevents injuries on your feet. Wounds, scrapes, corns, and calluses  Check your feet daily for blisters, cuts, bruises, sores, and redness. If you cannot see the bottom of your feet, use a mirror or ask someone for help.  Do not cut corns or calluses or try to remove them with medicine.  If you  find a minor scrape, cut, or break in the skin on your feet, keep it and the skin around it clean and dry. You may clean these areas with mild soap and water. Do not clean the area with peroxide, alcohol, or iodine.  If you have a wound, scrape, corn, or callus on your foot, look at it several times a day to make sure it is healing and not infected. Check for: ? Redness, swelling, or pain. ? Fluid or blood. ? Warmth. ? Pus or a bad smell. General instructions  Do not cross your legs. This may decrease blood flow to your feet.  Do not use heating pads or hot water bottles on your feet. They may burn your skin. If you have lost feeling in your feet or legs, you may not know this is happening until it is too late.  Protect your feet from hot and cold by wearing shoes, such as at the beach or on hot pavement.  Schedule a complete foot exam at least once a year (annually) or more often if you have foot problems. If you have foot problems, report any cuts, sores, or bruises to your health care provider immediately. Contact a health care provider if:  You have a medical condition that increases your risk of infection and you have any cuts, sores, or bruises on your feet.  You have an injury that is not   healing.  You have redness on your legs or feet.  You feel burning or tingling in your legs or feet.  You have pain or cramps in your legs and feet.  Your legs or feet are numb.  Your feet always feel cold.  You have pain around a toenail. Get help right away if:  You have a wound, scrape, corn, or callus on your foot and: ? You have pain, swelling, or redness that gets worse. ? You have fluid or blood coming from the wound, scrape, corn, or callus. ? Your wound, scrape, corn, or callus feels warm to the touch. ? You have pus or a bad smell coming from the wound, scrape, corn, or callus. ? You have a fever. ? You have a red line going up your leg. Summary  Check your feet every day  for cuts, sores, red spots, swelling, and blisters.  Moisturize feet and legs daily.  Wear shoes that fit properly and have enough cushioning.  If you have foot problems, report any cuts, sores, or bruises to your health care provider immediately.  Schedule a complete foot exam at least once a year (annually) or more often if you have foot problems. This information is not intended to replace advice given to you by your health care provider. Make sure you discuss any questions you have with your health care provider. Document Released: 11/16/2000 Document Revised: 01/01/2018 Document Reviewed: 12/21/2016 Elsevier Patient Education  2020 [employer]   Onychomycosis/Fungal Toenails  WHAT IS IT? An infection that lies within the keratin of your nail plate that is caused by a fungus.  WHY ME? Fungal infections affect all ages, sexes, races, and creeds.  There may be many factors that predispose you to a fungal infection such as age, coexisting medical conditions such as diabetes, or an autoimmune disease; stress, medications, fatigue, genetics, etc.  Bottom line: fungus thrives in a warm, moist environment and your shoes offer such a location.  IS IT CONTAGIOUS? Theoretically, yes.  You do not want to share shoes, nail clippers or files with someone who has fungal toenails.  Walking around barefoot in the same room or sleeping in the same bed is unlikely to transfer the organism.  It is important to realize, however, that fungus can spread easily from one nail to the next on the same foot.  HOW DO WE TREAT THIS?  There are several ways to treat this condition.  Treatment may depend on many factors such as age, medications, pregnancy, liver and kidney conditions, etc.  It is best to ask your doctor which options are available to you.  1. No treatment.   Unlike many other medical concerns, you can live with this condition.  However for many people this can be a painful condition and may lead to  ingrown toenails or a bacterial infection.  It is recommended that you keep the nails cut short to help reduce the amount of fungal nail. 2. Topical treatment.  These range from herbal remedies to prescription strength nail lacquers.  About 40-50% effective, topicals require twice daily application for approximately 9 to 12 months or until an entirely new nail has grown out.  The most effective topicals are medical grade medications available through physicians offices. 3. Oral antifungal medications.  With an 80-90% cure rate, the most common oral medication requires 3 to 4 months of therapy and stays in your system for a year as the new nail grows out.  Oral antifungal medications do require   blood work to make sure it is a safe drug for you.  A liver function panel will be performed prior to starting the medication and after the first month of treatment.  It is important to have the blood work performed to avoid any harmful side effects.  In general, this medication safe but blood work is required. 4. Laser Therapy.  This treatment is performed by applying a specialized laser to the affected nail plate.  This therapy is noninvasive, fast, and non-painful.  It is not covered by insurance and is therefore, out of pocket.  The results have been very good with a 80-95% cure rate.  [facility] is the only practice in the area to offer this therapy. 5. Permanent Nail Avulsion.  Removing the entire nail so that a new nail will not grow back. 

## 2019-08-03 NOTE — Progress Notes (Signed)
Subjective: Sierra Wright presents today referred by [name], MD for diabetic foot evaluation.  Patient relates 10 year history of diabetes.  Patient denies any history of foot wounds.  Patient relates positive history of numbness.  Denies any  history of tingling, burning, pins/needles sensations.  Today, patient c/o of painful, discolored, thick toenails which interfere with daily activities.  Pain is aggravated when wearing enclosed shoe gear.   Patient also also complaining about cramping in her legs.  She states Dr. Posey Pronto prescribed gabapentin.  Past Medical History:  Diagnosis Date  . Arthritis   . Coronary artery disease 1999   CABG  . Depression   . Diabetes mellitus without complication ([place])    type II; metformin and Amaryl  . Diabetic neuropathy ([place])   . History of anemia as a child   . HOH (hard of hearing)   . Hypertension   . Hypothyroidism    synthroid  . PONV (postoperative nausea and vomiting)   . Restless leg syndrome   . S/P cardiac cath 08/13/18 stable CAD 08/14/2018  . Shortness of breath 09/03/12   ECHO- The LA is Moderately Dilated, mild mitral annular calcification. mild pulmonary hypertension, moderate concentric LV hypertrophy. Atrial septum is aneurysmal. Aortic valve appears to be mildly sclerotic.EF 88%  . Urinary incontinence   . Weakness of both legs     Patient Active Problem List   Diagnosis Date Noted  . S/P cardiac cath 08/13/18 stable CAD 08/14/2018  . Orthopnea 08/12/2018  . Chest pain 09/01/2016  . Diabetic polyneuropathy associated with diabetes mellitus due to underlying condition ([place]) 07/26/2016  . Chronic daily headache 07/26/2016  . RLS (restless legs syndrome) 07/26/2016  . Ulnar neuropathy of right upper extremity 07/26/2016  . Ischemic stroke ([place]) 07/26/2016  . CVA (cerebral infarction): Early subacute 07/14/2016  . Back pain 07/14/2016  . Essential hypertension   . 2nd degree AV block   . Thyroid activity  decreased   . Coronary artery disease involving coronary bypass graft of native heart without angina pectoris   . Binocular vision disorder with diplopia 07/13/2016  . Weakness 07/13/2016  . Chronic diastolic CHF (congestive heart failure) ([place]) 04/16/2016  . Osteoporosis 07/27/2015    Class: Chronic  . Spinal stenosis, lumbar region, with neurogenic claudication 07/26/2015    Class: Chronic  . Spondylolisthesis of lumbar region 07/26/2015    Class: Chronic  . Hx of CABG 03/15/2014  . Osteoarthritis 03/15/2014  . DM type 2 goal A1C below 7.5 03/15/2014  . Peripheral edema 03/15/2014  . Benign hypertensive heart disease without heart failure 03/15/2014  . Right bundle branch block 03/15/2014  . First degree AV block 03/15/2014  . Sinus bradycardia by electrocardiogram 03/15/2014  . Dizziness 03/15/2014    Past Surgical History:  Procedure Laterality Date  . ABDOMINAL HYSTERECTOMY    . APPENDECTOMY    . BACK SURGERY    . CARDIAC CATHERIZATION  08/20/03   Widely patent bypass grafts. Normal left ventricular systolic function.  Marland Kitchen CARDIAC CATHETERIZATION    . CHOLECYSTECTOMY    . COLONOSCOPY    . CORONARY ARTERY BYPASS GRAFT    . EYE SURGERY     bilateral cataracts  . LEFT HEART CATH AND CORONARY ANGIOGRAPHY N/A 08/13/2018   Procedure: LEFT HEART CATH AND CORONARY ANGIOGRAPHY;  Surgeon: Burnell Blanks, MD;  Location: [facility];  Service: Cardiovascular;  Laterality: N/A;  . LUMBAR LAMINECTOMY/DECOMPRESSION MICRODISCECTOMY N/A 07/26/2015   Procedure: BILATERAL Lumbar 3-4 SUBTOTAL  HEMILAMINECTOMY WITH LATERAL RECESS DECOMPRESSION;  Surgeon: Kerrin ChampagneJames E Nitka, MD;  Location: [facility];  Service: Orthopedics;  Laterality: N/A;  . SHOULDER SURGERY    . TUBAL LIGATION       Current Outpatient Medications:  .  amLODipine (NORVASC) 5 MG tablet, Take 1 tablet by mouth once daily, Disp: 90 tablet, Rfl: 1 .  Ascorbic Acid (VITAMIN C ER PO), Take 1,000 mg by mouth daily. , Disp:  , Rfl:  .  aspirin EC 81 MG EC tablet, Take 1 tablet (81 mg total) by mouth daily., Disp: , Rfl:  .  benazepril (LOTENSIN) 40 MG tablet, Take 1 tablet by mouth once daily, Disp: 90 tablet, Rfl: 2 .  Calcium Carb-Cholecalciferol (CALCIUM 600/VITAMIN D3) 600-800 MG-UNIT TABS, Take 1 tablet by mouth 2 (two) times daily., Disp: , Rfl:  .  carbidopa-levodopa (SINEMET IR) 25-100 MG tablet, Take 1 tablet by mouth at bedtime., Disp: 90 tablet, Rfl: 3 .  clopidogrel (PLAVIX) 75 MG tablet, Take 1 tablet (75 mg total) by mouth daily., Disp: 30 tablet, Rfl: 3 .  diazepam (VALIUM) 10 MG tablet, Take 10 mg by mouth every 6 (six) hours as needed for anxiety., Disp: , Rfl:  .  furosemide (LASIX) 40 MG tablet, Take 1 tablet (40 mg total) by mouth 2 (two) times daily., Disp: 180 tablet, Rfl: 2 .  gabapentin (NEURONTIN) 100 MG capsule, Take 200 mg by mouth at bedtime., Disp: , Rfl:  .  Garlic 1000 MG CAPS, Take 1,000 mg by mouth daily. , Disp: , Rfl:  .  glimepiride (AMARYL) 2 MG tablet, Take 2 mg by mouth daily with breakfast., Disp: , Rfl:  .  hydrALAZINE (APRESOLINE) 25 MG tablet, TAKE 1 TABLET BY MOUTH THREE TIMES DAILY, Disp: 270 tablet, Rfl: 0 .  insulin glargine (LANTUS) 100 UNIT/ML injection, Inject 24 Units into the skin daily. , Disp: , Rfl:  .  isosorbide mononitrate (IMDUR) 30 MG 24 hr tablet, Take 1 tablet (30 mg total) by mouth daily., Disp: 90 tablet, Rfl: 3 .  levothyroxine (SYNTHROID, LEVOTHROID) 100 MCG tablet, Take 100 mcg by mouth daily before breakfast., Disp: , Rfl:  .  metoprolol succinate (TOPROL-XL) 25 MG 24 hr tablet, Take 1 tablet by mouth once daily, Disp: 90 tablet, Rfl: 3 .  nitroGLYCERIN (NITROSTAT) 0.4 MG SL tablet, Place 1 tablet (0.4 mg total) under the tongue every 5 (five) minutes as needed for chest pain., Disp: 25 tablet, Rfl: 6 .  potassium chloride (K-DUR,KLOR-CON) 10 MEQ tablet, Take 10-20 mEq by mouth 2 (two) times daily. Take 10 mg with breakfast and 20 mg with lunch, Disp: ,  Rfl:  .  tiZANidine (ZANAFLEX) 4 MG tablet, Take 4 mg by mouth at bedtime., Disp: , Rfl:  .  vitamin B-12 (CYANOCOBALAMIN) 1000 MCG tablet, Take 1,000 mcg by mouth daily., Disp: , Rfl:   Allergies  Allergen Reactions  . Cymbalta [Duloxetine Hcl] Nausea And Vomiting  . Statins Other (See Comments)    Causes myalgias  . Amitriptyline Other (See Comments)    Pt. States that it caused it to be suicidal.   . Benadryl [Diphenhydramine Hcl (Sleep)] Other (See Comments)    Makes the patient very "hyper"  . Tylenol [Acetaminophen] Nausea Only  . Welchol [Colesevelam Hcl] Other (See Comments)    Causes dizziness    Social History   Occupational History  . Not on file  Tobacco Use  . Smoking status: Never Smoker  . Smokeless tobacco: Never Used  Substance and Sexual Activity  . Alcohol use: No  . Drug use: No  . Sexual activity: Not on file    Family History  Problem Relation Age of Onset  . Other Mother   . Pneumonia Mother   . Heart attack Father   . Liver cancer Sister   . Stroke Sister   . Heart attack Brother 56  . Stroke Son 74  . Healthy Daughter     Immunization History  Administered Date(s) Administered  . Influenza, High Dose Seasonal PF 08/14/2018    Review of systems: Positive Findings in bold print.  Constitutional:  chills, fatigue, fever, sweats, weight change Communication: Nurse, learning disability, sign Presenter, broadcasting, hand writing, iPad/Android device Head: headaches, head injury Eyes: changes in vision, eye pain, glaucoma, cataracts, macular degeneration, diplopia, glare,  light sensitivity, eyeglasses or contacts, blindness Ears nose mouth throat: hearing impaired, hearing aids,  ringing in ears, deaf, sign language,  vertigo, nosebleeds,  rhinitis,  cold sores, snoring, swollen glands Cardiovascular: HTN, edema, arrhythmia, pacemaker in place, defibrillator in place, chest pain/tightness, chronic anticoagulation, blood clot, heart failure, MI Peripheral  Vascular: leg cramps, varicose veins, blood clots, lymphedema, varicosities Respiratory:  difficulty breathing, denies congestion, SOB, wheezing, cough, emphysema Gastrointestinal: change in appetite or weight, abdominal pain, constipation, diarrhea, nausea, vomiting, vomiting blood, change in bowel habits, abdominal pain, jaundice, rectal bleeding, hemorrhoids, GERD Genitourinary:  nocturia,  pain on urination, polyuria,  blood in urine, Foley catheter, urinary urgency, ESRD on hemodialysis Musculoskeletal: amputation, cramping, stiff joints, painful joints, decreased joint motion, fractures, OA, gout, hemiplegia, paraplegia, uses cane, wheelchair bound, uses walker, uses rollator Skin: +changes in toenails, color change, dryness, itching, mole changes,  rash, wound(s) Neurological: headaches, numbness in feet, paresthesias in feet, burning in feet, fainting,  seizures, change in speech,  headaches, memory problems/poor historian, cerebral palsy, weakness, paralysis, CVA, TIA Endocrine: diabetes, hypothyroidism, hyperthyroidism,  goiter, dry mouth, flushing, heat intolerance,  cold intolerance,  excessive thirst, denies polyuria,  nocturia Hematological:  easy bleeding, excessive bleeding, easy bruising, enlarged lymph nodes, on long term blood thinner, history of past transusions Allergy/immunological:  hives, eczema, frequent infections, multiple drug allergies, seasonal allergies, transplant recipient, multiple food allergies Psychiatric:  anxiety, depression, mood disorder, suicidal ideations, hallucinations, insomnia  Objective: Vitals:   07/22/19 1518  BP: 140/70  Pulse: 72  Temp: 98.5 F (36.9 C)   Vascular Examination: Capillary refill time <3 seconds x 10 digits  Dorsalis pedis pulses faintly palpable b/l.  Posterior tibial pulses faintly palpable b/l.  Digital hair sparse x 10 digits.  Skin temperature gradient WNL b/l.  No pain with calf compression.   Dermatological  Examination: Skin with normal turgor, texture and tone b/l.  Toenails 1-5 b/l discolored, thick, dystrophic with subungual debris and pain with palpation to nailbeds due to thickness of nails.  Musculoskeletal: Muscle strength 5/5 to all LE muscle groups.  Neurological: Sensation decreased with 10 gram monofilament.  Vibratory sensation intact.  Assessment: 1. Painful onychomycosis toenails 1-5 b/l  2. NIDDM  Plan: 1. Discussed diabetic foot care principles. Literature dispensed on today. 2. Regarding cramping in her legs, she does have history of restless leg syndrome.  She is to see cardiology soon and I have written a prescription for her to have lower extremity arterial duplex and ABIs performed to rule out any blockage. 3. Toenails 1-5 b/l were debrided in length and girth without iatrogenic bleeding. 4. Patient to continue soft, supportive shoe gear 5. Patient to report any pedal injuries to  medical professional immediately. 6. Follow up 3 months.  7. Patient/POA to call should there be a concern in the interim.

## 2019-08-06 ENCOUNTER — Encounter: Payer: Self-pay | Admitting: Cardiovascular Disease

## 2019-08-06 ENCOUNTER — Other Ambulatory Visit: Payer: Self-pay

## 2019-08-06 ENCOUNTER — Ambulatory Visit (INDEPENDENT_AMBULATORY_CARE_PROVIDER_SITE_OTHER): Payer: Medicare Other | Admitting: Cardiovascular Disease

## 2019-08-06 VITALS — BP 120/60 | HR 74 | Ht 64.0 in | Wt 195.0 lb

## 2019-08-06 DIAGNOSIS — I25810 Atherosclerosis of coronary artery bypass graft(s) without angina pectoris: Secondary | ICD-10-CM | POA: Diagnosis not present

## 2019-08-06 DIAGNOSIS — I119 Hypertensive heart disease without heart failure: Secondary | ICD-10-CM

## 2019-08-06 DIAGNOSIS — R252 Cramp and spasm: Secondary | ICD-10-CM

## 2019-08-06 DIAGNOSIS — I10 Essential (primary) hypertension: Secondary | ICD-10-CM | POA: Diagnosis not present

## 2019-08-06 DIAGNOSIS — I5032 Chronic diastolic (congestive) heart failure: Secondary | ICD-10-CM | POA: Diagnosis not present

## 2019-08-06 MED ORDER — ISOSORBIDE MONONITRATE ER 30 MG PO TB24: 30.0000 mg | ORAL_TABLET | Freq: Every day | ORAL | 3 refills | Status: DC

## 2019-08-06 MED ORDER — CLOPIDOGREL BISULFATE 75 MG PO TABS: 75.0000 mg | ORAL_TABLET | Freq: Every day | ORAL | 3 refills | Status: DC

## 2019-08-06 NOTE — Progress Notes (Signed)
Cardiology Office Note   Date:  08/06/2019   ID:  Sierra Wright, DOB 08/20/1934, MRN [id number]  PCP:  Mila PalmerWolters, Sharon, MD  Cardiologist: Cassell Clementhomas Brackbill MD  --> Donato Studley   No chief complaint on file.    Problem list 1. Coronary artery disease-status post coronary artery bypass grafting 2. Essential hypertension 3.  Chronic diastolic congestive heart failure  Sierra MajorSarah L Wright is a 85 y.o. female who presents for a six-month office visit This pleasant 85 year old woman is seen for a followup office visit. Her PCP is Dr. Mila PalmerSharon Wolters. The patient has a history of known ischemic heart disease. She had successful CABG 16 years ago by Dr. Tyrone SageGerhardt. She has not been experiencing any subsequent chest pain or angina. She notes occasional shortness of breath.  Her last echocardiogram was 03/25/14 and showed an ejection fraction of 65-70% with grade 1 diastolic dysfunction.  She has a history of hypertension and she has a past history of mild fluid retention responding to Lasix. She has intermittent ankle edema. She has occasional mild dizzy spells but no syncope. She has not been experiencing any palpitations or tachycardia. She is relatively sedentary because of significant osteoarthritis of her knees. She arrived in our office today by wheelchair. She sleeps with one pillow under her head and also a pillow under her feet to help with her mild dependent edema. During the day she wears compression hose. She does not have any history of TIA or stroke. She does not have any history of peptic ulcer disease or GI bleeding. She has a history of intolerance to cholesterol lowering drugs including statins, ezetimibe, and Welchol. Family history is positive for ischemic heart disease. Her father died at 1464 and a brother died at 3955 of heart attacks.  At her last visit we decreased her beta blocker because of bradycardia and we increased her hydralazine because of systolic hypertension. Since then her blood  pressure has been satisfactory. The patient has not been having any chest pain.  She has had no recurrence of the paroxysmal atrial flutter fibrillation which occurred in August 2016 immediately postoperative. she does get short of breath with minimal housework activities.  Her peripheral edema has resolved.  EKG today shows normal sinus rhythm.  Apr 16, 2016:  Pt is seen today .   Recent transfer from Dr. Patty SermonsBrackbill.  Has hx of CAD, chronic diastolic CHF,  Essential HTN. No cardiac issues. Is in a wheelchair ,   Uses a walker at home  Avoids salt .   10/04/2016:   Sierra Wright is seen today for follow-up visit. Is very short of breath today since her hospitalization one month ago. She has been in the hospital twice since I last saw her.   She was admitted in October and was seen by Dr. Antoine PocheHochrein  on October 1. She had presented with episodes of chest pain. Troponin levels were negative. He recommended an outpatient Lexiscan  Myoview study.  Has not had the stress myoview yet   Aug. 14, 2018:  Sierra Wright is here to follow-up for her  CAD and shortness of breath.  She had a stress Myoview study in November, 2017 which was normal. She has no ischemia.  Left ventricular ejection fraction 60%.  August 11, 2018: Sierra Wright is seen as a work in visit .  She has been having worsening dyspnea.  More shortness of breath recently  Has a dry cough  Has not seen her primary MD recently Has a headache.  Is dizzy.    Has a choking sensation when she lies down.   Had CP on one occasion when she was choking SL NTG helped  This was 6 weeks ago  Has had a chronic headache for weeks .  These symptoms do not feel like her symptoms prior to her CABG .  Had a false positive myoview in 2004.   January 12, 2019:  Sierra Wright seen back today for follow-up visit.  When I saw her previously in September, 2019 she was having symptoms that were concerning for unstable angina. She had a mildly elevated troponin level  and we sent her over to the hospital to be admitted.  Heart catheterization revealed three-vessel coronary artery disease.  2 of the 3 of her bypass grafts were patent. No targets for PCI.  The patient was treated medically.  Had lots of CP,  Had severe CP   Took 2 SL NTG with eventual relief .    Sept. 3, 2020  Seen today for follow up .  Seen with daugter.   Sierra Wright No cp.   Has leg cramps .  Her foot doctor has asked for LE arterial duplex scan and ABIs.     Past Medical History:  Diagnosis Date  . Arthritis   . Coronary artery disease 1999   CABG  . Depression   . Diabetes mellitus without complication ([facility])    type II; metformin and Amaryl  . Diabetic neuropathy ([facility])   . History of anemia as a child   . HOH (hard of hearing)   . Hypertension   . Hypothyroidism    synthroid  . PONV (postoperative nausea and vomiting)   . Restless leg syndrome   . S/P cardiac cath 08/13/18 stable CAD 08/14/2018  . Shortness of breath 09/03/12   ECHO- The LA is Moderately Dilated, mild mitral annular calcification. mild pulmonary hypertension, moderate concentric LV hypertrophy. Atrial septum is aneurysmal. Aortic valve appears to be mildly sclerotic.EF 88%  . Urinary incontinence   . Weakness of both legs     Past Surgical History:  Procedure Laterality Date  . ABDOMINAL HYSTERECTOMY    . APPENDECTOMY    . BACK SURGERY    . CARDIAC CATHERIZATION  08/20/03   Widely patent bypass grafts. Normal left ventricular systolic function.  Marland Kitchen CARDIAC CATHETERIZATION    . CHOLECYSTECTOMY    . COLONOSCOPY    . CORONARY ARTERY BYPASS GRAFT    . EYE SURGERY     bilateral cataracts  . LEFT HEART CATH AND CORONARY ANGIOGRAPHY N/A 08/13/2018   Procedure: LEFT HEART CATH AND CORONARY ANGIOGRAPHY;  Surgeon: Kathleene Hazel, MD;  Location: [facility];  Service: Cardiovascular;  Laterality: N/A;  . LUMBAR LAMINECTOMY/DECOMPRESSION MICRODISCECTOMY N/A 07/26/2015   Procedure: BILATERAL  Lumbar 3-4 SUBTOTAL HEMILAMINECTOMY WITH LATERAL RECESS DECOMPRESSION;  Surgeon: Kerrin Champagne, MD;  Location: [facility];  Service: Orthopedics;  Laterality: N/A;  . SHOULDER SURGERY    . TUBAL LIGATION       Current Outpatient Medications  Medication Sig Dispense Refill  . amLODipine (NORVASC) 5 MG tablet Take 1 tablet by mouth once daily 90 tablet 1  . Ascorbic Acid (VITAMIN C ER PO) Take 1,000 mg by mouth daily.     Marland Kitchen aspirin EC 81 MG EC tablet Take 1 tablet (81 mg total) by mouth daily.    . benazepril (LOTENSIN) 40 MG tablet Take 1 tablet by mouth once daily 90 tablet 2  . Calcium Carb-Cholecalciferol (  CALCIUM 600/VITAMIN D3) 600-800 MG-UNIT TABS Take 1 tablet by mouth 2 (two) times daily.    . carbidopa-levodopa (SINEMET IR) 25-100 MG tablet Take 1 tablet by mouth at bedtime. 90 tablet 3  . clopidogrel (PLAVIX) 75 MG tablet Take 1 tablet (75 mg total) by mouth daily. 30 tablet 3  . diazepam (VALIUM) 10 MG tablet Take 10 mg by mouth every 6 (six) hours as needed for anxiety.    . furosemide (LASIX) 40 MG tablet Take 1 tablet (40 mg total) by mouth 2 (two) times daily. 180 tablet 2  . gabapentin (NEURONTIN) 100 MG capsule Take 200 mg by mouth at bedtime.    . Garlic 0109 MG CAPS Take 1,000 mg by mouth daily.     Marland Kitchen glimepiride (AMARYL) 2 MG tablet Take 2 mg by mouth daily with breakfast.    . hydrALAZINE (APRESOLINE) 25 MG tablet TAKE 1 TABLET BY MOUTH THREE TIMES DAILY 270 tablet 0  . insulin glargine (LANTUS) 100 UNIT/ML injection Inject 24 Units into the skin daily.     . isosorbide mononitrate (IMDUR) 30 MG 24 hr tablet Take 1 tablet (30 mg total) by mouth daily. 90 tablet 3  . levothyroxine (SYNTHROID, LEVOTHROID) 100 MCG tablet Take 100 mcg by mouth daily before breakfast.    . metoprolol succinate (TOPROL-XL) 25 MG 24 hr tablet Take 1 tablet by mouth once daily 90 tablet 3  . nitroGLYCERIN (NITROSTAT) 0.4 MG SL tablet Place 1 tablet (0.4 mg total) under the tongue every 5 (five)  minutes as needed for chest pain. 25 tablet 6  . potassium chloride (K-DUR,KLOR-CON) 10 MEQ tablet Take 10-20 mEq by mouth 2 (two) times daily. Take 10 mg with breakfast and 20 mg with lunch    . vitamin B-12 (CYANOCOBALAMIN) 1000 MCG tablet Take 1,000 mcg by mouth daily.     No current facility-administered medications for this visit.     Allergies:   Cymbalta [duloxetine hcl], Statins, Amitriptyline, Benadryl [diphenhydramine hcl (sleep)], Tylenol [acetaminophen], and Welchol [colesevelam hcl]    Social History:  The patient  reports that she has never smoked. She has never used smokeless tobacco. She reports that she does not drink alcohol or use drugs.   Family History:  The patient's family history includes Healthy in her daughter; Heart attack in her father; Heart attack (age of onset: 78) in her brother; Liver cancer in her sister; Other in her mother; Pneumonia in her mother; Stroke in her sister; Stroke (age of onset: 34) in her son.    ROS:  Please see the history of present illness.   Otherwise, review of systems are positive for none.   All other systems are reviewed and negative.     Physical Exam: Blood pressure 120/60, pulse 74, height 5\' 4"  (1.626 m), weight 195 lb (88.5 kg), SpO2 94 %.  GEN:  Elderly , chronically ill female,  Examined in wheelchair.  HEENT: Normal NECK: No JVD; No carotid bruits LYMPHATICS: No lymphadenopathy CARDIAC: RR   RESPIRATORY:  Clear to auscultation without rales, wheezing or rhonchi  ABDOMEN: Soft, non-tender, non-distended MUSCULOSKELETAL:  Trace edema.  Poor distal pulses. Bilaterally  SKIN: Warm and dry NEUROLOGIC:  Alert and oriented x 3    EKG:   Sept. 3,  2020   :   ,  NSR with 1st degree AV block , RBBB , old Inf. MI     Recent Labs: 08/11/2018: ALT 30; TSH 3.277 08/12/2018: B Natriuretic Peptide 97.5 08/14/2018: BUN 18;  Creatinine, Ser 1.26; Hemoglobin 11.8; Platelets 167; Potassium 4.2; Sodium 139    Lipid Panel     Component Value Date/Time   CHOL 149 08/12/2018 0236   CHOL 183 08/11/2018 1254   TRIG 171 (H) 08/12/2018 0236   HDL 34 (L) 08/12/2018 0236   HDL 45 08/11/2018 1254   CHOLHDL 4.4 08/12/2018 0236   VLDL 34 08/12/2018 0236   LDLCALC 81 08/12/2018 0236   LDLCALC 93 08/11/2018 1254      Wt Readings from Last 3 Encounters:  08/06/19 195 lb (88.5 kg)  01/12/19 212 lb 6.4 oz (96.3 kg)  12/22/18 209 lb (94.8 kg)         ASSESSMENT AND PLAN:  1.  CAD: Overall she seems to be doing pretty well.  She has not had any episodes of angina.  2. ischemic heart disease status post CABG:.  She has severe native coronary artery disease as well as severe graft disease.  She is she is not a candidate for further coronary interventions.   3. chronic diastolic CHF :   She continues to have swelling.  She is very debilitated.  Continue current medications.  4. hypertensive cardiovascular disease -     5. right bundle branch block,   6.  Paroxysmal atrial flutter fibrillation,  -she remains in normal sinus rhythm.  She is not a good candidate for anticoagulation given her age and risk of falling.      Kristeen MissPhilip Xylina Rhoads, MD  08/06/2019 3:48 PM    [facility] [address],  [address] [place], [place]  [address] Pager [phone] Phone: [phone]; Fax: [phone]

## 2019-08-06 NOTE — Patient Instructions (Addendum)
Medication Instructions:  Your physician recommends that you continue on your current medications as directed. Please refer to the Current Medication list given to you today.  If you need a refill on your cardiac medications before your next appointment, please call your pharmacy.   Lab work: None Ordered  If you have labs (blood work) drawn today and your tests are completely normal, you will receive your results only by: Marland Kitchen MyChart Message (if you have MyChart) OR . A paper copy in the mail If you have any lab test that is abnormal or we need to change your treatment, we will call you to review the results.  Testing/Procedures: Your physician has requested that you have a lower extremity arterial duplex. This test is an ultrasound of the arteries in the legs or arms. It looks at arterial blood flow in the legs and arms. Allow one hour for Lower and Upper Arterial scans. There are no restrictions or special instructions  Follow-Up: At [facility], you and your health needs are our priority.  As part of our continuing mission to provide you with exceptional heart care, we have created designated Provider Care Teams.  These Care Teams include your primary Cardiologist (physician) and Advanced Practice Providers (APPs -  Physician Assistants and Nurse Practitioners) who all work together to provide you with the care you need, when you need it. You will need a follow up appointment in:  6 months.  Please call our office 2 months in advance to schedule this appointment.  You may see Mertie Moores, MD or one of the following Advanced Practice Providers on your designated Care Team: Richardson Dopp, PA-C [place], [place] . Daune Perch, NP

## 2019-08-10 ENCOUNTER — Other Ambulatory Visit: Payer: Self-pay | Admitting: Cardiovascular Disease

## 2019-08-17 ENCOUNTER — Other Ambulatory Visit: Payer: Self-pay | Admitting: Cardiovascular Disease

## 2019-08-17 DIAGNOSIS — R252 Cramp and spasm: Secondary | ICD-10-CM

## 2019-08-17 DIAGNOSIS — I739 Peripheral vascular disease, unspecified: Secondary | ICD-10-CM

## 2019-08-19 ENCOUNTER — Ambulatory Visit (HOSPITAL_COMMUNITY)
Admission: RE | Admit: 2019-08-19 | Discharge: 2019-08-19 | Disposition: A | Discharge status: Home / Self Care | Payer: Medicare Other | Source: Ambulatory Visit | Attending: Cardiovascular Disease | Admitting: Cardiovascular Disease

## 2019-08-19 ENCOUNTER — Other Ambulatory Visit: Payer: Self-pay

## 2019-08-19 DIAGNOSIS — I739 Peripheral vascular disease, unspecified: Secondary | ICD-10-CM

## 2019-08-19 DIAGNOSIS — R252 Cramp and spasm: Secondary | ICD-10-CM | POA: Diagnosis present

## 2019-09-30 ENCOUNTER — Encounter: Payer: Self-pay | Admitting: Podiatry

## 2019-09-30 ENCOUNTER — Ambulatory Visit: Payer: Medicare Other | Admitting: Podiatry

## 2019-09-30 ENCOUNTER — Other Ambulatory Visit: Payer: Self-pay

## 2019-09-30 DIAGNOSIS — M79674 Pain in right toe(s): Secondary | ICD-10-CM

## 2019-09-30 DIAGNOSIS — E1142 Type 2 diabetes mellitus with diabetic polyneuropathy: Secondary | ICD-10-CM | POA: Diagnosis not present

## 2019-09-30 DIAGNOSIS — M79675 Pain in left toe(s): Secondary | ICD-10-CM

## 2019-09-30 DIAGNOSIS — L84 Corns and callosities: Secondary | ICD-10-CM | POA: Diagnosis not present

## 2019-09-30 DIAGNOSIS — B351 Tinea unguium: Secondary | ICD-10-CM

## 2019-09-30 NOTE — Patient Instructions (Addendum)
Diabetes Mellitus and Foot Care Foot care is an important part of your health, especially when you have diabetes. Diabetes may cause you to have problems because of poor blood flow (circulation) to your feet and legs, which can cause your skin to:  Become thinner and drier.  Break more easily.  Heal more slowly.  Peel and crack. You may also have nerve damage (neuropathy) in your legs and feet, causing decreased feeling in them. This means that you may not notice minor injuries to your feet that could lead to more serious problems. Noticing and addressing any potential problems early is the best way to prevent future foot problems. How to care for your feet Foot hygiene  Wash your feet daily with warm water and mild soap. Do not use hot water. Then, pat your feet and the areas between your toes until they are completely dry. Do not soak your feet as this can dry your skin.  Trim your toenails straight across. Do not dig under them or around the cuticle. File the edges of your nails with an emery board or nail file.  Apply a moisturizing lotion or petroleum jelly to the skin on your feet and to dry, brittle toenails. Use lotion that does not contain alcohol and is unscented. Do not apply lotion between your toes. Shoes and socks  Wear clean socks or stockings every day. Make sure they are not too tight. Do not wear knee-high stockings since they may decrease blood flow to your legs.  Wear shoes that fit properly and have enough cushioning. Always look in your shoes before you put them on to be sure there are no objects inside.  To break in new shoes, wear them for just a few hours a day. This prevents injuries on your feet. Wounds, scrapes, corns, and calluses  Check your feet daily for blisters, cuts, bruises, sores, and redness. If you cannot see the bottom of your feet, use a mirror or ask someone for help.  Do not cut corns or calluses or try to remove them with medicine.  If you  find a minor scrape, cut, or break in the skin on your feet, keep it and the skin around it clean and dry. You may clean these areas with mild soap and water. Do not clean the area with peroxide, alcohol, or iodine.  If you have a wound, scrape, corn, or callus on your foot, look at it several times a day to make sure it is healing and not infected. Check for: ? Redness, swelling, or pain. ? Fluid or blood. ? Warmth. ? Pus or a bad smell. General instructions  Do not cross your legs. This may decrease blood flow to your feet.  Do not use heating pads or hot water bottles on your feet. They may burn your skin. If you have lost feeling in your feet or legs, you may not know this is happening until it is too late.  Protect your feet from hot and cold by wearing shoes, such as at the beach or on hot pavement.  Schedule a complete foot exam at least once a year (annually) or more often if you have foot problems. If you have foot problems, report any cuts, sores, or bruises to your health care provider immediately. Contact a health care provider if:  You have a medical condition that increases your risk of infection and you have any cuts, sores, or bruises on your feet.  You have an injury that is not   healing.  You have redness on your legs or feet.  You feel burning or tingling in your legs or feet.  You have pain or cramps in your legs and feet.  Your legs or feet are numb.  Your feet always feel cold.  You have pain around a toenail. Get help right away if:  You have a wound, scrape, corn, or callus on your foot and: ? You have pain, swelling, or redness that gets worse. ? You have fluid or blood coming from the wound, scrape, corn, or callus. ? Your wound, scrape, corn, or callus feels warm to the touch. ? You have pus or a bad smell coming from the wound, scrape, corn, or callus. ? You have a fever. ? You have a red line going up your leg. Summary  Check your feet every day  for cuts, sores, red spots, swelling, and blisters.  Moisturize feet and legs daily.  Wear shoes that fit properly and have enough cushioning.  If you have foot problems, report any cuts, sores, or bruises to your health care provider immediately.  Schedule a complete foot exam at least once a year (annually) or more often if you have foot problems. This information is not intended to replace advice given to you by your health care provider. Make sure you discuss any questions you have with your health care provider. Document Released: 11/16/2000 Document Revised: 01/01/2018 Document Reviewed: 12/21/2016 Elsevier Patient Education  2020 [employer]  

## 2019-10-03 NOTE — Progress Notes (Signed)
Subjective: Sierra Wright is a 85 y.o. y.o. female who presents today with h/o diabetes. She presents with callus left hallux and painful, discolored, thick toenails and painful callus/corn which interfere with daily activities. Pain is aggravated when wearing enclosed shoe gear and relieved with periodic professional debridement.  Patient requesting medication change be updated in her chart to reflect she is taking Amaryl 1 mg tablet twice daily. She is no longer taking the 2 mg tablet.  She is interested in diabetic shoes.   Her daughter is present during the visit.    [name], MD is her PCP.   Dr. Birdena Crandall, MD, is her Endocrinologist.  Allergies  Allergen Reactions  . Cymbalta [Duloxetine Hcl] Nausea And Vomiting  . Statins Other (See Comments)    Causes myalgias  . Amitriptyline Other (See Comments)    Pt. States that it caused it to be suicidal.   . Benadryl [Diphenhydramine Hcl (Sleep)] Other (See Comments)    Makes the patient very "hyper"  . Tylenol [Acetaminophen] Nausea Only  . Welchol [Colesevelam Hcl] Other (See Comments)    Causes dizziness    Objective: Vascular Examination: Capillary refill time <3 seconds b/l.  Dorsalis pedis pulses 1/4 b/l.  Posterior tibial pulses 1/4 b/l.  Digital hair sparse b/l.   Skin temperature gradient WNL b/l.  Dermatological Examination: Pedal skin is thin, shiny and atrophic b/l.  Toenails 1-5 b/l discolored, thick, dystrophic with subungual debris and pain with palpation to nailbeds due to thickness of nails.  Hyperkeratotic lesion left hallux IPJ. No erythema, no edema, no drainage, no flocculence noted.   Musculoskeletal: Muscle strength 5/5 to all LE muscle groups b/l.  HAV with bunion deformity left foot.  Neurological: Sensation decreased with 10 gram monofilament b/l.  Vibratory sensation intact b/l.  Assessment: 1.  Painful onychomycosis toenails 1-5 b/l 2.  Callus left halllux 3.  HAV  with bunion left foot 4.  NIDDM with neuropathy  Plan: 1. Continue diabetic foot care principles. Literature dispensed on today. 2. Toenails 1-5 b/l were debrided in length and girth without iatrogenic bleeding. 3. Hyperkeratotic lesion left hallux pared with sterile scalpel blade without incident. 4. Patient to continue soft, supportive shoe gear daily. 5. Start process for diabetic shoes. Per Medicare guidelines, patient's feet need to be evaluated by an MD/DO managing patient's diabetes and diabetic shoe certification form needs to be signed by the MD/DO. If patient's diabetes is being managed by an Endocrinologist, the Endocrinologist must evaluate patient's feet and sign the Medicare diabetic shoe certification form. 6. Patient to report any pedal injuries to medical professional immediately. 7. Follow up 3 months.  8. Patient/POA to call should there be a concern in the interim.

## 2019-10-21 ENCOUNTER — Ambulatory Visit: Payer: Medicare Other | Admitting: Orthotics

## 2019-10-21 ENCOUNTER — Other Ambulatory Visit: Payer: Self-pay

## 2019-11-13 ENCOUNTER — Other Ambulatory Visit: Payer: Self-pay | Admitting: Cardiovascular Disease

## 2019-11-19 ENCOUNTER — Encounter: Payer: Self-pay | Admitting: Neurology

## 2019-11-20 ENCOUNTER — Other Ambulatory Visit: Payer: Self-pay

## 2019-11-20 ENCOUNTER — Telehealth (INDEPENDENT_AMBULATORY_CARE_PROVIDER_SITE_OTHER): Payer: Medicare Other | Admitting: Neurology

## 2019-11-20 VITALS — Ht 64.0 in | Wt 212.0 lb

## 2019-11-20 DIAGNOSIS — E0842 Diabetes mellitus due to underlying condition with diabetic polyneuropathy: Secondary | ICD-10-CM

## 2019-11-20 DIAGNOSIS — G2581 Restless legs syndrome: Secondary | ICD-10-CM | POA: Diagnosis not present

## 2019-11-20 DIAGNOSIS — G44209 Tension-type headache, unspecified, not intractable: Secondary | ICD-10-CM

## 2019-11-20 MED ORDER — CARBIDOPA-LEVODOPA 25-100 MG PO TABS: 1.5000 | ORAL_TABLET | Freq: Every day | ORAL | 3 refills | Status: DC

## 2019-11-20 MED ORDER — BACLOFEN 10 MG PO TABS: ORAL_TABLET | ORAL | 3 refills | Status: DC

## 2019-11-20 NOTE — Progress Notes (Signed)
    Virtual Visit via Telephone Note The purpose of this virtual visit is to provide medical care while limiting exposure to the novel coronavirus.    Consent was obtained for phone visit:  Yes.   Answered questions that patient had about telehealth interaction:  Yes.   I discussed the limitations, risks, security and privacy concerns of performing an evaluation and management service by telephone. I also discussed with the patient that there may be a patient responsible charge related to this service. The patient expressed understanding and agreed to proceed.  Pt location: Home Physician Location: office Name of referring provider:  [name], MD I connected with .Sierra Wright at patients initiation/request on 11/20/2019 at 10:50 AM EST by telephone and verified that I am speaking with the correct person using two identifiers.  Pt MRN:  [id number] Pt DOB:  [birth year]   History of Present Illness:This is a 85 year-old female returning for follow-up of diabetic neuropathy and restless leg syndrome. She says that her left leg is worse at night and often finds herself having to stretch and move it around to get relief. She is on sinemet 1 tab at bedtime.   She does not have worsening numbness/tingling of the pain.   She also complains of right hand tingling and pain which wakes her up from sleeping several times per week.  No hand weakness.  She complains of headache, described as pressure like sensation around the head which occurs 2-4 times per week.  She does not treat the pain and there is no other neurological symptoms with her headaches.  Headaches are not worse with coughing, sneezing, and do not wake her up from sleeping.    Assessment and Plan:   1.  Restless leg syndrome, worsening  - Increase sinemet to 1.5 tab at bedtime  2.  Right hand paresthesias, ?entrapment neuropathy.   NCS/EMG declined.  Recommend a trial of using a wrist splint and avoid over bending at the elbow and  hand  3.   Tension headaches  - Start baclofen 5-10mg  as needed for pain  4.  Diabetic polyneuropathy affecting the feet  - Continue gabapentin 200mg  at bedtime.  No further titrated due to CKD  5.  Right thalamic stroke due to small vessel disease, no residual deficits (07/2016).  Continue plavix 75mg  daily.  She has statin allergy  Follow Up Instructions:   I discussed the assessment and treatment plan with the patient. The patient was provided an opportunity to ask questions and all were answered. The patient agreed with the plan and demonstrated an understanding of the instructions.   The patient was advised to call back or seek an in-person evaluation if the symptoms worsen or if the condition fails to improve as anticipated.  Return to clinic in 1 year   Total Time spent in visit with the patient was:  20 min, of which 100% of the time was spent in counseling and/or coordinating care.   Pt understands and agrees with the plan of care outlined.     [name], DO

## 2019-11-24 ENCOUNTER — Ambulatory Visit: Payer: Medicare Other | Admitting: Orthotics

## 2019-11-24 ENCOUNTER — Other Ambulatory Visit: Payer: Self-pay

## 2019-11-24 DIAGNOSIS — E0842 Diabetes mellitus due to underlying condition with diabetic polyneuropathy: Secondary | ICD-10-CM

## 2019-11-24 DIAGNOSIS — L84 Corns and callosities: Secondary | ICD-10-CM

## 2019-11-24 NOTE — Progress Notes (Signed)
Patient left because I was running behind; tried to call her back to advise that she was next to be seen, and that this would be last opportunity to dispense shoes in 2020.   Got her husband on phone and advised him.

## 2019-12-05 ENCOUNTER — Other Ambulatory Visit: Payer: Self-pay | Admitting: Cardiovascular Disease

## 2019-12-08 ENCOUNTER — Ambulatory Visit: Payer: Medicare Other | Admitting: Neurology

## 2019-12-25 ENCOUNTER — Ambulatory Visit: Payer: Medicare Other | Admitting: Neurology

## 2020-01-06 ENCOUNTER — Encounter: Payer: Self-pay | Admitting: Podiatry

## 2020-01-06 ENCOUNTER — Other Ambulatory Visit: Payer: Self-pay

## 2020-01-06 ENCOUNTER — Ambulatory Visit: Payer: Medicare Other | Admitting: Podiatry

## 2020-01-06 DIAGNOSIS — M79675 Pain in left toe(s): Secondary | ICD-10-CM | POA: Diagnosis not present

## 2020-01-06 DIAGNOSIS — M79674 Pain in right toe(s): Secondary | ICD-10-CM | POA: Diagnosis not present

## 2020-01-06 DIAGNOSIS — E0842 Diabetes mellitus due to underlying condition with diabetic polyneuropathy: Secondary | ICD-10-CM

## 2020-01-06 DIAGNOSIS — L84 Corns and callosities: Secondary | ICD-10-CM

## 2020-01-06 DIAGNOSIS — B351 Tinea unguium: Secondary | ICD-10-CM

## 2020-01-06 DIAGNOSIS — M2012 Hallux valgus (acquired), left foot: Secondary | ICD-10-CM

## 2020-01-06 DIAGNOSIS — E1142 Type 2 diabetes mellitus with diabetic polyneuropathy: Secondary | ICD-10-CM

## 2020-01-06 NOTE — Patient Instructions (Signed)
Diabetes Mellitus and Foot Care Foot care is an important part of your health, especially when you have diabetes. Diabetes may cause you to have problems because of poor blood flow (circulation) to your feet and legs, which can cause your skin to:  Become thinner and drier.  Break more easily.  Heal more slowly.  Peel and crack. You may also have nerve damage (neuropathy) in your legs and feet, causing decreased feeling in them. This means that you may not notice minor injuries to your feet that could lead to more serious problems. Noticing and addressing any potential problems early is the best way to prevent future foot problems. How to care for your feet Foot hygiene  Wash your feet daily with warm water and mild soap. Do not use hot water. Then, pat your feet and the areas between your toes until they are completely dry. Do not soak your feet as this can dry your skin.  Trim your toenails straight across. Do not dig under them or around the cuticle. File the edges of your nails with an emery board or nail file.  Apply a moisturizing lotion or petroleum jelly to the skin on your feet and to dry, brittle toenails. Use lotion that does not contain alcohol and is unscented. Do not apply lotion between your toes. Shoes and socks  Wear clean socks or stockings every day. Make sure they are not too tight. Do not wear knee-high stockings since they may decrease blood flow to your legs.  Wear shoes that fit properly and have enough cushioning. Always look in your shoes before you put them on to be sure there are no objects inside.  To break in new shoes, wear them for just a few hours a day. This prevents injuries on your feet. Wounds, scrapes, corns, and calluses  Check your feet daily for blisters, cuts, bruises, sores, and redness. If you cannot see the bottom of your feet, use a mirror or ask someone for help.  Do not cut corns or calluses or try to remove them with medicine.  If you  find a minor scrape, cut, or break in the skin on your feet, keep it and the skin around it clean and dry. You may clean these areas with mild soap and water. Do not clean the area with peroxide, alcohol, or iodine.  If you have a wound, scrape, corn, or callus on your foot, look at it several times a day to make sure it is healing and not infected. Check for: ? Redness, swelling, or pain. ? Fluid or blood. ? Warmth. ? Pus or a bad smell. General instructions  Do not cross your legs. This may decrease blood flow to your feet.  Do not use heating pads or hot water bottles on your feet. They may burn your skin. If you have lost feeling in your feet or legs, you may not know this is happening until it is too late.  Protect your feet from hot and cold by wearing shoes, such as at the beach or on hot pavement.  Schedule a complete foot exam at least once a year (annually) or more often if you have foot problems. If you have foot problems, report any cuts, sores, or bruises to your health care provider immediately. Contact a health care provider if:  You have a medical condition that increases your risk of infection and you have any cuts, sores, or bruises on your feet.  You have an injury that is not   healing.  You have redness on your legs or feet.  You feel burning or tingling in your legs or feet.  You have pain or cramps in your legs and feet.  Your legs or feet are numb.  Your feet always feel cold.  You have pain around a toenail. Get help right away if:  You have a wound, scrape, corn, or callus on your foot and: ? You have pain, swelling, or redness that gets worse. ? You have fluid or blood coming from the wound, scrape, corn, or callus. ? Your wound, scrape, corn, or callus feels warm to the touch. ? You have pus or a bad smell coming from the wound, scrape, corn, or callus. ? You have a fever. ? You have a red line going up your leg. Summary  Check your feet every day  for cuts, sores, red spots, swelling, and blisters.  Moisturize feet and legs daily.  Wear shoes that fit properly and have enough cushioning.  If you have foot problems, report any cuts, sores, or bruises to your health care provider immediately.  Schedule a complete foot exam at least once a year (annually) or more often if you have foot problems. This information is not intended to replace advice given to you by your health care provider. Make sure you discuss any questions you have with your health care provider. Document Revised: 08/12/2019 Document Reviewed: 12/21/2016 Elsevier Patient Education  2020 [employer] Inc[employer]  

## 2020-01-10 NOTE — Progress Notes (Signed)
Subjective: Sierra Wright presents today for follow up of preventative diabetic foot care and callus(es) left hallux and painful mycotic toenails b/l that are difficult to trim. Pain interferes with ambulation. Aggravating factors include wearing enclosed shoe gear. Pain is relieved with periodic professional debridement..   Allergies  Allergen Reactions  . Cymbalta [Duloxetine Hcl] Nausea And Vomiting  . Statins Other (See Comments)    Causes myalgias  . Amitriptyline Other (See Comments)    Pt. States that it caused it to be suicidal.   . Benadryl [Diphenhydramine Hcl (Sleep)] Other (See Comments)    Makes the patient very "hyper"  . Tylenol [Acetaminophen] Nausea Only  . Welchol [Colesevelam Hcl] Other (See Comments)    Causes dizziness     Objective: There were no vitals filed for this visit.  Vascular Examination:  Capillary fill time to digits <3s b/l, faintly palpable DP pulses b/l, faintly palpable PT pulses b/l, pedal hair sparse b/l and skin temperature gradient within normal limits b/l  Dermatological Examination: Pedal skin is thin shiny, atrophic bilaterally, no open wounds bilaterally, no interdigital macerations bilaterally, toenails 1-5 b/l elongated, dystrophic, thickened, crumbly with subungual debris and hyperkeratotic lesion(s) left hallux.  No erythema, no edema, no drainage, no flocculence  Musculoskeletal: Normal muscle strength 5/5 to all lower extremity muscle groups bilaterally, no pain crepitus or joint limitation noted with ROM b/l and bunion deformity noted b/l  Neurological: Protective sensation decreased with 10 gram monofilament b/l and vibratory sensation intact b/l  Assessment: Pain due to onychomycosis of toenails of both feet  Callus left hallux  Diabetic polyneuropathy associated with diabetes mellitus due to underlying condition (HCC)  Plan: -Continue diabetic foot care principles. Literature dispensed on today.  -Dispensed one pair of  extra depth shoes and 3 pairs of custom-molded total contact multidensity Plastazote/EVA inserts that were made from a positive model of the patient's foot. Custom molded inserts were required to achieve and maintain total contact with the plantar aspect of the patient's foot for the life of the device and to prevent tissue damage. Three pairs of inserts are required in this patient due to bottoming out and loss of protection after four months of wear. The shoes fit well and the inserts achieve total contact with the plantar surface of the patient's foot. A statement of certifying physician is on file in the patient's chart that documents the medical necessity for footwear and/or inserts. The patient was given a copy of the supplier standards, the return policy and new shoe break-in instructions. The patient was advised to wear the shoes at home for only one hour on the first day and to check their feet for any sores or irritations. The patient will call if any problems arise. -Toenails 1-5 b/l were debrided in length and girth without iatrogenic bleeding. -Callus debrided without complication or incident. Total number debrided =1 left hallux -Patient to continue soft, supportive shoe gear daily. -Patient to report any pedal injuries to medical professional immediately. -Patient/POA to call should there be question/concern in the interim.  Return in about 3 months (around 04/04/2020) for diabetic nail and callus trim/ Plavix.

## 2020-01-20 ENCOUNTER — Emergency Department (HOSPITAL_COMMUNITY): Payer: Medicare Other

## 2020-01-20 ENCOUNTER — Other Ambulatory Visit: Payer: Self-pay

## 2020-01-20 ENCOUNTER — Emergency Department (HOSPITAL_COMMUNITY)
Admission: EM | Admit: 2020-01-20 | Discharge: 2020-01-20 | Disposition: A | Discharge status: Home / Self Care | Payer: Medicare Other | Attending: Emergency Medicine | Admitting: Emergency Medicine

## 2020-01-20 ENCOUNTER — Ambulatory Visit: Payer: Medicare Other | Admitting: Orthotics

## 2020-01-20 DIAGNOSIS — Z20822 Contact with and (suspected) exposure to covid-19: Secondary | ICD-10-CM | POA: Insufficient documentation

## 2020-01-20 DIAGNOSIS — E039 Hypothyroidism, unspecified: Secondary | ICD-10-CM | POA: Diagnosis not present

## 2020-01-20 DIAGNOSIS — Z7901 Long term (current) use of anticoagulants: Secondary | ICD-10-CM | POA: Insufficient documentation

## 2020-01-20 DIAGNOSIS — I5032 Chronic diastolic (congestive) heart failure: Secondary | ICD-10-CM | POA: Insufficient documentation

## 2020-01-20 DIAGNOSIS — R0600 Dyspnea, unspecified: Secondary | ICD-10-CM | POA: Insufficient documentation

## 2020-01-20 DIAGNOSIS — E119 Type 2 diabetes mellitus without complications: Secondary | ICD-10-CM | POA: Insufficient documentation

## 2020-01-20 DIAGNOSIS — R0602 Shortness of breath: Secondary | ICD-10-CM | POA: Diagnosis present

## 2020-01-20 DIAGNOSIS — I110 Hypertensive heart disease with heart failure: Secondary | ICD-10-CM | POA: Insufficient documentation

## 2020-01-20 DIAGNOSIS — Z951 Presence of aortocoronary bypass graft: Secondary | ICD-10-CM | POA: Diagnosis not present

## 2020-01-20 DIAGNOSIS — Z7982 Long term (current) use of aspirin: Secondary | ICD-10-CM | POA: Insufficient documentation

## 2020-01-20 DIAGNOSIS — Z79899 Other long term (current) drug therapy: Secondary | ICD-10-CM | POA: Insufficient documentation

## 2020-01-20 DIAGNOSIS — I2510 Atherosclerotic heart disease of native coronary artery without angina pectoris: Secondary | ICD-10-CM | POA: Diagnosis not present

## 2020-01-20 DIAGNOSIS — Z7984 Long term (current) use of oral hypoglycemic drugs: Secondary | ICD-10-CM | POA: Diagnosis not present

## 2020-01-20 LAB — BASIC METABOLIC PANEL
Anion gap: 15 (ref 5–15)
BUN: 17 mg/dL (ref 8–23)
CO2: 20 mmol/L — ABNORMAL LOW (ref 22–32)
Calcium: 9.3 mg/dL (ref 8.9–10.3)
Chloride: 105 mmol/L (ref 98–111)
Creatinine, Ser: 1.21 mg/dL — ABNORMAL HIGH (ref 0.44–1.00)
GFR calc Af Amer: 48 mL/min — ABNORMAL LOW (ref >60)
GFR calc non Af Amer: 41 mL/min — ABNORMAL LOW (ref >60)
Glucose, Bld: 177 mg/dL — ABNORMAL HIGH (ref 70–99)
Potassium: 3.8 mmol/L (ref 3.5–5.1)
Sodium: 140 mmol/L (ref 135–145)

## 2020-01-20 LAB — CBC
HCT: 42.3 % (ref 36.0–46.0)
Hemoglobin: 13.6 g/dL (ref 12.0–15.0)
MCH: 30.4 pg (ref 26.0–34.0)
MCHC: 32.2 g/dL (ref 30.0–36.0)
MCV: 94.4 fL (ref 80.0–100.0)
Platelets: 213 10*3/uL (ref 150–400)
RBC: 4.48 MIL/uL (ref 3.87–5.11)
RDW: 13.1 % (ref 11.5–15.5)
WBC: 8.9 10*3/uL (ref 4.0–10.5)
nRBC: 0 % (ref 0.0–0.2)

## 2020-01-20 LAB — TROPONIN I (HIGH SENSITIVITY)
Troponin I (High Sensitivity): 14 ng/L (ref <18)
Troponin I (High Sensitivity): 16 ng/L (ref <18)

## 2020-01-20 LAB — RESPIRATORY PANEL BY RT PCR (FLU A&B, COVID)
Influenza A by PCR: NEGATIVE
Influenza B by PCR: NEGATIVE
SARS Coronavirus 2 by RT PCR: NEGATIVE

## 2020-01-20 LAB — BRAIN NATRIURETIC PEPTIDE: B Natriuretic Peptide: 131.8 pg/mL — ABNORMAL HIGH (ref 0.0–100.0)

## 2020-01-20 MED ORDER — IOHEXOL 350 MG/ML SOLN
100.0000 mL | Freq: Once | INTRAVENOUS | Status: AC | PRN
  Administered 2020-01-20: 13:00:00 100 mL via INTRAVENOUS

## 2020-01-20 MED ORDER — SODIUM CHLORIDE 0.9% FLUSH: 3.0000 mL | Freq: Once | INTRAVENOUS | Status: DC

## 2020-01-20 NOTE — ED Provider Notes (Signed)
[facility] EMERGENCY DEPARTMENT Provider Note   CSN: [id number] Arrival date & time: 01/20/20  1023     History Chief Complaint  Patient presents with  . Shortness of Breath    Sierra Wright is a 86 y.o. female.  86 year old female with history of CHF presents with increased dyspnea exertion x2 days.  Symptoms similar to her prior CHF exacerbations.  Did have 1 episode of chest pain which lasted for several minutes and which occurred at rest.  Patient is in a wheelchair but states that she does get more short of breath when she stands up.  Denies any orthopnea.  Has some shaking chills last night but denies any productive cough.  No recorded fever.  Denies any urinary symptoms.  No treatment use prior to arrival        Past Medical History:  Diagnosis Date  . Arthritis   . Coronary artery disease 1999   CABG  . Depression   . Diabetes mellitus without complication (HCC)    type II; metformin and Amaryl  . Diabetic neuropathy (HCC)   . History of anemia as a child   . HOH (hard of hearing)   . Hypertension   . Hypothyroidism    synthroid  . PONV (postoperative nausea and vomiting)   . Restless leg syndrome   . S/P cardiac cath 08/13/18 stable CAD 08/14/2018  . Shortness of breath 09/03/12   ECHO- The LA is Moderately Dilated, mild mitral annular calcification. mild pulmonary hypertension, moderate concentric LV hypertrophy. Atrial septum is aneurysmal. Aortic valve appears to be mildly sclerotic.EF 88%  . Urinary incontinence   . Weakness of both legs     Patient Active Problem List   Diagnosis Date Noted  . S/P cardiac cath 08/13/18 stable CAD 08/14/2018  . Orthopnea 08/12/2018  . Chest pain 09/01/2016  . Diabetic polyneuropathy associated with diabetes mellitus due to underlying condition (HCC) 07/26/2016  . Chronic daily headache 07/26/2016  . RLS (restless legs syndrome) 07/26/2016  . Ulnar neuropathy of right upper extremity 07/26/2016  .  Ischemic stroke (HCC) 07/26/2016  . CVA (cerebral infarction): Early subacute 07/14/2016  . Back pain 07/14/2016  . Essential hypertension   . 2nd degree AV block   . Thyroid activity decreased   . Coronary artery disease involving coronary bypass graft of native heart without angina pectoris   . Binocular vision disorder with diplopia 07/13/2016  . Weakness 07/13/2016  . Chronic diastolic CHF (congestive heart failure) (HCC) 04/16/2016  . Osteoporosis 07/27/2015    Class: Chronic  . Spinal stenosis, lumbar region, with neurogenic claudication 07/26/2015    Class: Chronic  . Spondylolisthesis of lumbar region 07/26/2015    Class: Chronic  . Hx of CABG 03/15/2014  . Osteoarthritis 03/15/2014  . DM type 2 goal A1C below 7.5 03/15/2014  . Peripheral edema 03/15/2014  . Benign hypertensive heart disease without heart failure 03/15/2014  . Right bundle branch block 03/15/2014  . First degree AV block 03/15/2014  . Sinus bradycardia by electrocardiogram 03/15/2014  . Dizziness 03/15/2014    Past Surgical History:  Procedure Laterality Date  . ABDOMINAL HYSTERECTOMY    . APPENDECTOMY    . BACK SURGERY    . CARDIAC CATHERIZATION  08/20/03   Widely patent bypass grafts. Normal left ventricular systolic function.  Marland Kitchen CARDIAC CATHETERIZATION    . CHOLECYSTECTOMY    . COLONOSCOPY    . CORONARY ARTERY BYPASS GRAFT    . EYE SURGERY  bilateral cataracts  . LEFT HEART CATH AND CORONARY ANGIOGRAPHY N/A 08/13/2018   Procedure: LEFT HEART CATH AND CORONARY ANGIOGRAPHY;  Surgeon: Kathleene Hazel, MD;  Location: [facility] INVASIVE CV LAB;  Service: Cardiovascular;  Laterality: N/A;  . LUMBAR LAMINECTOMY/DECOMPRESSION MICRODISCECTOMY N/A 07/26/2015   Procedure: BILATERAL Lumbar 3-4 SUBTOTAL HEMILAMINECTOMY WITH LATERAL RECESS DECOMPRESSION;  Surgeon: Kerrin Champagne, MD;  Location: [facility] OR;  Service: Orthopedics;  Laterality: N/A;  . SHOULDER SURGERY    . TUBAL LIGATION       OB History   No  obstetric history on file.     Family History  Problem Relation Age of Onset  . Other Mother   . Pneumonia Mother   . Heart attack Father   . Liver cancer Sister   . Stroke Sister   . Heart attack Brother 56  . Stroke Son 69  . Healthy Daughter     Social History   Tobacco Use  . Smoking status: Never Smoker  . Smokeless tobacco: Never Used  Substance Use Topics  . Alcohol use: No  . Drug use: No    Home Medications Prior to Admission medications   Medication Sig Start Date End Date Taking? Authorizing Provider  amLODipine (NORVASC) 5 MG tablet Take 1 tablet by mouth once daily 12/07/19   [name], Deloris Ping, MD  Ascorbic Acid (VITAMIN C ER PO) Take 1,000 mg by mouth daily.     [provider]  aspirin EC 81 MG EC tablet Take 1 tablet (81 mg total) by mouth daily. 08/15/18   Leone Brand, NP  baclofen (LIORESAL) 10 MG tablet Take 0.5 - 1 tablet as needed for severe headache. 11/20/19   Nita Sickle K, DO  benazepril (LOTENSIN) 40 MG tablet Take 1 tablet by mouth once daily 05/06/19   [name], Deloris Ping, MD  Calcium Carb-Cholecalciferol (CALCIUM 600/VITAMIN D3) 600-800 MG-UNIT TABS Take 1 tablet by mouth 2 (two) times daily.    [provider]  carbidopa-levodopa (SINEMET IR) 25-100 MG tablet Take 1.5 tablets by mouth at bedtime. 11/20/19   Nita Sickle K, DO  clopidogrel (PLAVIX) 75 MG tablet Take 1 tablet (75 mg total) by mouth daily. 08/06/19   [name], Deloris Ping, MD  diazepam (VALIUM) 10 MG tablet Take 10 mg by mouth every 6 (six) hours as needed for anxiety.    [provider]  furosemide (LASIX) 40 MG tablet Take 1 tablet (40 mg total) by mouth 2 (two) times daily. 06/19/19   Bhagat, Sharrell Ku, PA  gabapentin (NEURONTIN) 100 MG capsule Take 200 mg by mouth at bedtime.    [provider]  Garlic 1000 MG CAPS Take 1,000 mg by mouth daily.     [provider]  glimepiride (AMARYL) 1 MG tablet Take 1 mg by mouth 2 (two) times daily.     [provider]  hydrALAZINE (APRESOLINE) 25 MG tablet TAKE 1 TABLET BY MOUTH THREE TIMES DAILY 11/13/19   [name], Deloris Ping, MD  insulin glargine (LANTUS) 100 UNIT/ML injection Inject 26 Units into the skin daily.     [provider]  isosorbide mononitrate (IMDUR) 30 MG 24 hr tablet Take 1 tablet (30 mg total) by mouth daily. 08/06/19 07/31/20  [name], Deloris Ping, MD  levothyroxine (SYNTHROID, LEVOTHROID) 100 MCG tablet Take 100 mcg by mouth daily before breakfast.    [provider]  metoprolol succinate (TOPROL-XL) 25 MG 24 hr tablet Take 1 tablet by mouth once daily 03/09/19   [name], Loistine Chance  J, MD  nitroGLYCERIN (NITROSTAT) 0.4 MG SL tablet Place 1 tablet (0.4 mg total) under the tongue every 5 (five) minutes as needed for chest pain. 01/12/19   [name], Deloris Ping, MD  potassium chloride (K-DUR,KLOR-CON) 10 MEQ tablet Take 10-20 mEq by mouth 2 (two) times daily. Take 10 mg with breakfast and 20 mg with lunch    [provider]  sertraline (ZOLOFT) 50 MG tablet Take 50 mg by mouth daily. 12/24/19   [provider]  vitamin B-12 (CYANOCOBALAMIN) 1000 MCG tablet Take 1,000 mcg by mouth daily.    [provider]    Allergies    Cymbalta [duloxetine hcl], Statins, Amitriptyline, Benadryl [diphenhydramine hcl (sleep)], Tylenol [acetaminophen], and Welchol [colesevelam hcl]  Review of Systems   Review of Systems  All other systems reviewed and are negative.   Physical Exam Updated Vital Signs BP (!) 152/69 (BP Location: Right Arm)   Pulse 60   Temp 97.8 F (36.6 C) (Oral)   Resp (!) 22   SpO2 99%   Physical Exam Vitals and nursing note reviewed.  Constitutional:      General: She is not in acute distress.    Appearance: Normal appearance. She is well-developed. She is not toxic-appearing.  HENT:     Head: Normocephalic and atraumatic.  Eyes:     General: Lids are normal.     Conjunctiva/sclera: Conjunctivae normal.     Pupils: Pupils are  equal, round, and reactive to light.  Neck:     Thyroid: No thyroid mass.     Trachea: No tracheal deviation.  Cardiovascular:     Rate and Rhythm: Normal rate and regular rhythm.     Heart sounds: Normal heart sounds. No murmur. No gallop.   Pulmonary:     Effort: Pulmonary effort is normal. No respiratory distress.     Breath sounds: Normal breath sounds. No stridor. No decreased breath sounds, wheezing, rhonchi or rales.  Abdominal:     General: Bowel sounds are normal. There is no distension.     Palpations: Abdomen is soft.     Tenderness: There is no abdominal tenderness. There is no rebound.  Musculoskeletal:        General: No tenderness. Normal range of motion.     Cervical back: Normal range of motion and neck supple.  Skin:    General: Skin is warm and dry.     Findings: No abrasion or rash.  Neurological:     General: No focal deficit present.     Mental Status: She is alert and oriented to person, place, and time.     GCS: GCS eye subscore is 4. GCS verbal subscore is 5. GCS motor subscore is 6.     Cranial Nerves: No cranial nerve deficit.     Sensory: No sensory deficit.  Psychiatric:        Speech: Speech normal.        Behavior: Behavior normal.     ED Results / Procedures / Treatments   Labs (all labs ordered are listed, but only abnormal results are displayed) Labs Reviewed  RESPIRATORY PANEL BY RT PCR (FLU A&B, COVID)  BASIC METABOLIC PANEL  CBC  BRAIN NATRIURETIC PEPTIDE  TROPONIN I (HIGH SENSITIVITY)    EKG EKG Interpretation  Date/Time:  Wednesday January 20 2020 10:27:13 EST Ventricular Rate:  61 PR Interval:  348 QRS Duration: 142 QT Interval:  464 QTC Calculation: 467 R Axis:   49 Text Interpretation: Sinus rhythm with 1st  degree A-V block Right bundle branch block Abnormal ECG No significant change since last tracing Confirmed by Lacretia Leigh ([id number]) on 01/20/2020 11:00:55 AM   Radiology No results found.  Procedures Procedures  (including critical care time)  Medications Ordered in ED Medications  sodium chloride flush (NS) 0.9 % injection 3 mL (has no administration in time range)    ED Course  I have reviewed the triage vital signs and the nursing notes.  Pertinent labs & imaging results that were available during my care of the patient were reviewed by me and considered in my medical decision making (see chart for details).    MDM Rules/Calculators/A&P                      Patient with troponin is negative here x2.  She is Covid negative.  BNP only slightly elevated.  Chest x-ray without acute findings.  Subsequent chest CT was negative for PE.  Patient and family states that her breathing is at her baseline.  Will discharge home with return precautions. Final Clinical Impression(s) / ED Diagnoses Final diagnoses:  None    Rx / DC Orders ED Discharge Orders    None       Lacretia Leigh, MD 01/20/20 1426

## 2020-01-20 NOTE — ED Triage Notes (Signed)
Pt here for evaluation of worsening shortness of breath onset this morning. Hx CHF, per caregiver is compliant with lasix and has also taken three nitro this morning d/t sharp central chest pain. No chest pain on arrival.

## 2020-01-20 NOTE — ED Notes (Signed)
Patient transported to X-ray from waiting room, transport notified to bring pt to room after.

## 2020-02-02 ENCOUNTER — Telehealth: Payer: Self-pay | Admitting: Cardiovascular Disease

## 2020-02-02 NOTE — Telephone Encounter (Signed)
Spoke with patient and advised her that her daughter may come to the appointment with her. I reviewed Covid protocols and she verbalized understanding and agreement. She thanked me for the call.

## 2020-02-02 NOTE — Telephone Encounter (Signed)
   Pt is requesting to bring her daughter to her appt on 02/03/20. She said she's on a wheelchair and hard of hearing.  Please advice

## 2020-02-03 ENCOUNTER — Other Ambulatory Visit: Payer: Self-pay

## 2020-02-03 ENCOUNTER — Ambulatory Visit: Payer: Medicare Other | Admitting: Cardiovascular Disease

## 2020-02-03 ENCOUNTER — Encounter: Payer: Self-pay | Admitting: Cardiovascular Disease

## 2020-02-03 VITALS — BP 126/54 | HR 63 | Ht 66.0 in | Wt 215.0 lb

## 2020-02-03 DIAGNOSIS — I25810 Atherosclerosis of coronary artery bypass graft(s) without angina pectoris: Secondary | ICD-10-CM | POA: Diagnosis not present

## 2020-02-03 DIAGNOSIS — I10 Essential (primary) hypertension: Secondary | ICD-10-CM

## 2020-02-03 NOTE — Patient Instructions (Signed)
Medication Instructions:   Your physician recommends that you continue on your current medications as directed. Please refer to the Current Medication list given to you today.  *If you need a refill on your cardiac medications before your next appointment, please call your pharmacy*   Follow-Up: At [facility], you and your health needs are our priority.  As part of our continuing mission to provide you with exceptional heart care, we have created designated Provider Care Teams.  These Care Teams include your primary Cardiologist (physician) and Advanced Practice Providers (APPs -  Physician Assistants and Nurse Practitioners) who all work together to provide you with the care you need, when you need it.  We recommend signing up for the patient portal called "MyChart".  Sign up information is provided on this After Visit Summary.  MyChart is used to connect with patients for Virtual Visits (Telemedicine).  Patients are able to view lab/test results, encounter notes, upcoming appointments, etc.  Non-urgent messages can be sent to your provider as well.   To learn more about what you can do with MyChart, go to [email or web address].    Your next appointment:   6 month(s)  The format for your next appointment:   In Person  Provider:   WITH AN EXTENDER/APP ON DR. Harvie Bridge TEAM

## 2020-02-03 NOTE — Progress Notes (Signed)
Cardiology Office Note   Date:  02/03/2020   ID:  Sierra Wright, DOB 10/08/1934, MRN [id number]  PCP:  Sierra Jordan, MD  Cardiologist: Sierra Coco MD  --> Sierra Wright   No chief complaint on file.    Problem list 1. Coronary artery disease-status post coronary artery bypass grafting 2. Essential hypertension 3.  Chronic diastolic congestive heart failure  Sierra Wright is a 86 y.o. female who presents for a six-month office visit This pleasant 86 year old woman is seen for a followup office visit. Her PCP is Dr. Jonathon Wright. The patient has a history of known ischemic heart disease. She had successful CABG 16 years ago by Dr. Servando Wright. She has not been experiencing any subsequent chest pain or angina. She notes occasional shortness of breath.  Her last echocardiogram was 03/25/14 and showed an ejection fraction of 65-70% with grade 1 diastolic dysfunction.  She has a history of hypertension and she has a past history of mild fluid retention responding to Lasix. She has intermittent ankle edema. She has occasional mild dizzy spells but no syncope. She has not been experiencing any palpitations or tachycardia. She is relatively sedentary because of significant osteoarthritis of her knees. She arrived in our office today by wheelchair. She sleeps with one pillow under her head and also a pillow under her feet to help with her mild dependent edema. During the day she wears compression hose. She does not have any history of TIA or stroke. She does not have any history of peptic ulcer disease or GI bleeding. She has a history of intolerance to cholesterol lowering drugs including statins, ezetimibe, and Welchol. Family history is positive for ischemic heart disease. Her father died at 3 and a brother died at 29 of heart attacks.  At her last visit we decreased her beta blocker because of bradycardia and we increased her hydralazine because of systolic hypertension. Since then her blood  pressure has been satisfactory. The patient has not been having any chest pain.  She has had no recurrence of the paroxysmal atrial flutter fibrillation which occurred in August 2016 immediately postoperative. she does get short of breath with minimal housework activities.  Her peripheral edema has resolved.  EKG today shows normal sinus rhythm.  Apr 16, 2016:  Pt is seen today .   Recent transfer from Sierra Wright.  Has hx of CAD, chronic diastolic CHF,  Essential HTN. No cardiac issues. Is in a wheelchair ,   Uses a walker at home  Avoids salt .   10/04/2016:   Sierra Wright is seen today for follow-up visit. Is very short of breath today since her hospitalization one month ago. She has been in the hospital twice since I last saw her.   She was admitted in October and was seen by Dr. Percival Wright  on October 1. She had presented with episodes of chest pain. Troponin levels were negative. He recommended an outpatient [place] study.  Has not had the stress myoview yet   Aug. 14, 2018:  Sierra Wright is here to follow-up for her  CAD and shortness of breath.  She had a stress Myoview study in November, 2017 which was normal. She has no ischemia.  Left ventricular ejection fraction 60%.  August 11, 2018: Sierra Wright is seen as a work in visit .  She has been having worsening dyspnea.  More shortness of breath recently  Has a dry cough  Has not seen her primary MD recently Has a headache.  Is dizzy.    Has a choking sensation when she lies down.   Had CP on one occasion when she was choking SL NTG helped  This was 6 weeks ago  Has had a chronic headache for weeks .  These symptoms do not feel like her symptoms prior to her CABG .  Had a false positive myoview in 2004.   January 12, 2019:  Sierra Wright seen back today for follow-up visit.  When I saw her previously in September, 2019 she was having symptoms that were concerning for unstable angina. She had a mildly elevated troponin level  and we sent her over to the hospital to be admitted.  Heart catheterization revealed three-vessel coronary artery disease.  2 of the 3 of her bypass grafts were patent. No targets for PCI.  The patient was treated medically.  Had lots of CP,  Had severe CP   Took 2 SL NTG with eventual relief .    Sept. 3, 2020  Seen today for follow up .  Seen with daugter.   Sierra Wright No cp.   Has leg cramps .  Her foot doctor has asked for LE arterial duplex scan and ABIs.   February 03, 2020:  Sierra Wright seen today for follow-up visit.   She was seen with daughter, Sierra Wright.  She has a history of coronary artery disease with coronary artery bypass grafting.  She has known severe native and graft disease.  She is not a candidate for any further interventions.  She was found to have elevated end-diastolic filling pressure at her last heart catheterization in 2019.  She was seen in the ER Feb. 17, 2021.    Her last echocardiogram performed in August, 2017 reveals normal left ventricular systolic function. Does not walk .  Only stands to transfer from one to chair.  Limited by severe DOE and weakness  Still eats some salty foods.  Frozen dinners.  Takes Lasix 40 BID .  Does not want to take more lasix  Has headaches.   Past Medical History:  Diagnosis Date  . Arthritis   . Coronary artery disease 1999   CABG  . Depression   . Diabetes mellitus without complication (HCC)    type II; metformin and Amaryl  . Diabetic neuropathy (HCC)   . History of anemia as a child   . HOH (hard of hearing)   . Hypertension   . Hypothyroidism    synthroid  . PONV (postoperative nausea and vomiting)   . Restless leg syndrome   . S/P cardiac cath 08/13/18 stable CAD 08/14/2018  . Shortness of breath 09/03/12   ECHO- The LA is Moderately Dilated, mild mitral annular calcification. mild pulmonary hypertension, moderate concentric LV hypertrophy. Atrial septum is aneurysmal. Aortic valve appears to be mildly sclerotic.EF 88%  .  Urinary incontinence   . Weakness of both legs     Past Surgical History:  Procedure Laterality Date  . ABDOMINAL HYSTERECTOMY    . APPENDECTOMY    . BACK SURGERY    . CARDIAC CATHERIZATION  08/20/03   Widely patent bypass grafts. Normal left ventricular systolic function.  Marland Kitchen CARDIAC CATHETERIZATION    . CHOLECYSTECTOMY    . COLONOSCOPY    . CORONARY ARTERY BYPASS GRAFT    . EYE SURGERY     bilateral cataracts  . LEFT HEART CATH AND CORONARY ANGIOGRAPHY N/A 08/13/2018   Procedure: LEFT HEART CATH AND CORONARY ANGIOGRAPHY;  Surgeon: Kathleene Hazel, MD;  Location: [facility] INVASIVE CV  LAB;  Service: Cardiovascular;  Laterality: N/A;  . LUMBAR LAMINECTOMY/DECOMPRESSION MICRODISCECTOMY N/A 07/26/2015   Procedure: BILATERAL Lumbar 3-4 SUBTOTAL HEMILAMINECTOMY WITH LATERAL RECESS DECOMPRESSION;  Surgeon: Kerrin Champagne, MD;  Location: [facility] OR;  Service: Orthopedics;  Laterality: N/A;  . SHOULDER SURGERY    . TUBAL LIGATION       Current Outpatient Medications  Medication Sig Dispense Refill  . amLODipine (NORVASC) 5 MG tablet Take 1 tablet by mouth once daily 90 tablet 0  . Ascorbic Acid (VITAMIN C ER PO) Take 1,000 mg by mouth daily.     Marland Kitchen aspirin EC 81 MG EC tablet Take 1 tablet (81 mg total) by mouth daily.    . baclofen (LIORESAL) 10 MG tablet Take 0.5 - 1 tablet as needed for severe headache. 30 each 3  . benazepril (LOTENSIN) 40 MG tablet Take 1 tablet by mouth once daily 90 tablet 2  . Calcium Carb-Cholecalciferol (CALCIUM 600/VITAMIN D3) 600-800 MG-UNIT TABS Take 1 tablet by mouth 2 (two) times daily.    . carbidopa-levodopa (SINEMET IR) 25-100 MG tablet Take 1.5 tablets by mouth at bedtime. 135 tablet 3  . clopidogrel (PLAVIX) 75 MG tablet Take 1 tablet (75 mg total) by mouth daily. 90 tablet 3  . diazepam (VALIUM) 10 MG tablet Take 10 mg by mouth every 6 (six) hours as needed for anxiety.    . furosemide (LASIX) 40 MG tablet Take 1 tablet (40 mg total) by mouth 2 (two) times  daily. 180 tablet 2  . gabapentin (NEURONTIN) 100 MG capsule Take 200 mg by mouth at bedtime.    . Garlic 1000 MG CAPS Take 1,000 mg by mouth daily.     Marland Kitchen glimepiride (AMARYL) 1 MG tablet Take 1 mg by mouth 2 (two) times daily.    . hydrALAZINE (APRESOLINE) 25 MG tablet TAKE 1 TABLET BY MOUTH THREE TIMES DAILY 270 tablet 2  . insulin glargine (LANTUS) 100 UNIT/ML injection Inject 26 Units into the skin daily.     . isosorbide mononitrate (IMDUR) 30 MG 24 hr tablet Take 1 tablet (30 mg total) by mouth daily. 90 tablet 3  . levothyroxine (SYNTHROID, LEVOTHROID) 100 MCG tablet Take 100 mcg by mouth daily before breakfast.    . metoprolol succinate (TOPROL-XL) 25 MG 24 hr tablet Take 1 tablet by mouth once daily 90 tablet 3  . nitroGLYCERIN (NITROSTAT) 0.4 MG SL tablet Place 1 tablet (0.4 mg total) under the tongue every 5 (five) minutes as needed for chest pain. 25 tablet 6  . potassium chloride (K-DUR,KLOR-CON) 10 MEQ tablet Take 10-20 mEq by mouth 2 (two) times daily. Take 10 mg with breakfast and 20 mg with lunch    . sertraline (ZOLOFT) 50 MG tablet Take 50 mg by mouth daily.    . vitamin B-12 (CYANOCOBALAMIN) 1000 MCG tablet Take 1,000 mcg by mouth daily.     No current facility-administered medications for this visit.    Allergies:   Cymbalta [duloxetine hcl], Statins, Amitriptyline, Benadryl [diphenhydramine hcl (sleep)], Tylenol [acetaminophen], and Welchol [colesevelam hcl]    Social History:  The patient  reports that she has never smoked. She has never used smokeless tobacco. She reports that she does not drink alcohol or use drugs.   Family History:  The patient's family history includes Healthy in her daughter; Heart attack in her father; Heart attack (age of onset: 76) in her brother; Liver cancer in her sister; Other in her mother; Pneumonia in her mother; Stroke in her sister;  Stroke (age of onset: 72) in her son.    ROS:  Please see the history of present illness.   Otherwise,  review of systems are positive for none.   All other systems are reviewed and negative.     Physical Exam: Blood pressure (!) 126/54, pulse 63, height 5\' 6"  (1.676 m), weight 215 lb (97.5 kg), SpO2 97 %.  GEN:  Well nourished, well developed in no acute distress HEENT: Normal NECK: No JVD; No carotid bruits LYMPHATICS: No lymphadenopathy CARDIAC: RRR , no murmurs, rubs, gallops RESPIRATORY:  Clear to auscultation without rales, wheezing or rhonchi  ABDOMEN: Soft, non-tender, non-distended MUSCULOSKELETAL:  No edema; No deformity  SKIN: Warm and dry NEUROLOGIC:  Alert and oriented x 3   EKG:    Recent Labs: 01/20/2020: B Natriuretic Peptide 131.8; BUN 17; Creatinine, Ser 1.21; Hemoglobin 13.6; Platelets 213; Potassium 3.8; Sodium 140    Lipid Panel    Component Value Date/Time   CHOL 149 08/12/2018 0236   CHOL 183 08/11/2018 1254   TRIG 171 (H) 08/12/2018 0236   HDL 34 (L) 08/12/2018 0236   HDL 45 08/11/2018 1254   CHOLHDL 4.4 08/12/2018 0236   VLDL 34 08/12/2018 0236   LDLCALC 81 08/12/2018 0236   LDLCALC 93 08/11/2018 1254      Wt Readings from Last 3 Encounters:  02/03/20 215 lb (97.5 kg)  01/20/20 210 lb (95.3 kg)  11/19/19 212 lb (96.2 kg)     ASSESSMENT AND PLAN:  1.  CAD:    Has CAD .   Cath in 2019.   Showed severe native coronary artery disease.  She has 2 of 3 patent grafts.  There is severe stenosis in the proximal LAD followed by occlusion of the LAD the in the midsegment.  The LIMA is patent to the distal LAD.  She has rare episodes of shortness of breath and chest pain.  I encouraged her to to take an extra Lasix or try nitroglycerin if she has additional problems.  She presented to the hospital several weeks ago with severe shortness of breath.  Her B natruretic peptide and her troponin levels were all normal.  2. ischemic heart disease status post CABG:.  She has severe native coronary artery disease as well as severe graft disease.      3.  chronic diastolic CHF : She has chronic diastolic congestive heart failure.  4.  Generalized failure to thrive: Her overall health continues to gradually and steadily declined.  She now is not able to walk any at all.  She gets very short of breath transferring from her wheelchair to the bed or to the toilet.  I encouraged her to stay away from salty foods.  I given her the okay to take an extra Lasix if she starts having worsening shortness of breath.  She can also try nitroglycerin.  I explained to the daughter that this is probably just a normal progression of her illness.  I do not have anything further to offer her.     2020, MD  02/03/2020 2:46 PM    [facility] [address] [place],  [address] [place], [place]  [place] Pager [phone] Phone: [phone]; Fax: [phone]

## 2020-02-06 ENCOUNTER — Other Ambulatory Visit: Payer: Self-pay | Admitting: Cardiovascular Disease

## 2020-02-18 ENCOUNTER — Other Ambulatory Visit: Payer: Self-pay | Admitting: Cardiovascular Disease

## 2020-03-09 ENCOUNTER — Other Ambulatory Visit: Payer: Self-pay | Admitting: Cardiovascular Disease

## 2020-03-24 ENCOUNTER — Other Ambulatory Visit: Payer: Self-pay | Admitting: Cardiovascular Disease

## 2020-03-25 MED ORDER — METOPROLOL SUCCINATE ER 25 MG PO TB24: 25.0000 mg | ORAL_TABLET | Freq: Every day | ORAL | 3 refills | Status: DC

## 2020-04-06 ENCOUNTER — Other Ambulatory Visit: Payer: Self-pay

## 2020-04-06 ENCOUNTER — Ambulatory Visit: Payer: Medicare Other | Admitting: Podiatry

## 2020-04-06 ENCOUNTER — Encounter: Payer: Self-pay | Admitting: Podiatry

## 2020-04-06 DIAGNOSIS — B351 Tinea unguium: Secondary | ICD-10-CM

## 2020-04-06 DIAGNOSIS — M79674 Pain in right toe(s): Secondary | ICD-10-CM | POA: Diagnosis not present

## 2020-04-06 DIAGNOSIS — E0842 Diabetes mellitus due to underlying condition with diabetic polyneuropathy: Secondary | ICD-10-CM

## 2020-04-06 DIAGNOSIS — M79675 Pain in left toe(s): Secondary | ICD-10-CM

## 2020-04-06 DIAGNOSIS — L84 Corns and callosities: Secondary | ICD-10-CM

## 2020-04-06 NOTE — Patient Instructions (Addendum)
Schedule an appointment with Sierra Wright on 05/19/2021and bring your diabetic shoes to exchange   Diabetes Mellitus and [place] care is an important part of your health, especially when you have diabetes. Diabetes may cause you to have problems because of poor blood flow (circulation) to your feet and legs, which can cause your skin to:  Become thinner and drier.  Break more easily.  Heal more slowly.  Peel and crack. You may also have nerve damage (neuropathy) in your legs and feet, causing decreased feeling in them. This means that you may not notice minor injuries to your feet that could lead to more serious problems. Noticing and addressing any potential problems early is the best way to prevent future foot problems. How to care for your feet Foot hygiene  Wash your feet daily with warm water and mild soap. Do not use hot water. Then, pat your feet and the areas between your toes until they are completely dry. Do not soak your feet as this can dry your skin.  Trim your toenails straight across. Do not dig under them or around the cuticle. File the edges of your nails with an emery board or nail file.  Apply a moisturizing lotion or petroleum jelly to the skin on your feet and to dry, brittle toenails. Use lotion that does not contain alcohol and is unscented. Do not apply lotion between your toes. Shoes and socks  Wear clean socks or stockings every day. Make sure they are not too tight. Do not wear knee-high stockings since they may decrease blood flow to your legs.  Wear shoes that fit properly and have enough cushioning. Always look in your shoes before you put them on to be sure there are no objects inside.  To break in new shoes, wear them for just a few hours a day. This prevents injuries on your feet. Wounds, scrapes, corns, and calluses  Check your feet daily for blisters, cuts, bruises, sores, and redness. If you cannot see the bottom of your feet, use a mirror or ask  someone for help.  Do not cut corns or calluses or try to remove them with medicine.  If you find a minor scrape, cut, or break in the skin on your feet, keep it and the skin around it clean and dry. You may clean these areas with mild soap and water. Do not clean the area with peroxide, alcohol, or iodine.  If you have a wound, scrape, corn, or callus on your foot, look at it several times a day to make sure it is healing and not infected. Check for: ? Redness, swelling, or pain. ? Fluid or blood. ? Warmth. ? Pus or a bad smell. General instructions  Do not cross your legs. This may decrease blood flow to your feet.  Do not use heating pads or hot water bottles on your feet. They may burn your skin. If you have lost feeling in your feet or legs, you may not know this is happening until it is too late.  Protect your feet from hot and cold by wearing shoes, such as at the beach or on hot pavement.  Schedule a complete foot exam at least once a year (annually) or more often if you have foot problems. If you have foot problems, report any cuts, sores, or bruises to your health care provider immediately. Contact a health care provider if:  You have a medical condition that increases your risk of infection and you have any  cuts, sores, or bruises on your feet.  You have an injury that is not healing.  You have redness on your legs or feet.  You feel burning or tingling in your legs or feet.  You have pain or cramps in your legs and feet.  Your legs or feet are numb.  Your feet always feel cold.  You have pain around a toenail. Get help right away if:  You have a wound, scrape, corn, or callus on your foot and: ? You have pain, swelling, or redness that gets worse. ? You have fluid or blood coming from the wound, scrape, corn, or callus. ? Your wound, scrape, corn, or callus feels warm to the touch. ? You have pus or a bad smell coming from the wound, scrape, corn, or  callus. ? You have a fever. ? You have a red line going up your leg. Summary  Check your feet every day for cuts, sores, red spots, swelling, and blisters.  Moisturize feet and legs daily.  Wear shoes that fit properly and have enough cushioning.  If you have foot problems, report any cuts, sores, or bruises to your health care provider immediately.  Schedule a complete foot exam at least once a year (annually) or more often if you have foot problems. This information is not intended to replace advice given to you by your health care provider. Make sure you discuss any questions you have with your health care provider. Document Revised: 08/12/2019 Document Reviewed: 12/21/2016 Elsevier Patient Education  2020 [employer]  Peripheral Neuropathy Peripheral neuropathy is a type of nerve damage. It affects nerves that carry signals between the spinal cord and the arms, legs, and the rest of the body (peripheral nerves). It does not affect nerves in the spinal cord or brain. In peripheral neuropathy, one nerve or a group of nerves may be damaged. Peripheral neuropathy is a broad category that includes many specific nerve disorders, like diabetic neuropathy, hereditary neuropathy, and carpal tunnel syndrome. What are the causes? This condition may be caused by:  Diabetes. This is the most common cause of peripheral neuropathy.  Nerve injury.  Pressure or stress on a nerve that lasts a long time.  Lack (deficiency) of B vitamins. This can result from alcoholism, poor diet, or a restricted diet.  Infections.  Autoimmune diseases, such as rheumatoid arthritis and systemic lupus erythematosus.  Nerve diseases that are passed from parent to child (inherited).  Some medicines, such as cancer medicines (chemotherapy).  Poisonous (toxic) substances, such as lead and mercury.  Too little blood flowing to the legs.  Kidney disease.  Thyroid disease. In some cases, the cause of this  condition is not known. What are the signs or symptoms? Symptoms of this condition depend on which of your nerves is damaged. Common symptoms include:  Loss of feeling (numbness) in the feet, hands, or both.  Tingling in the feet, hands, or both.  Burning pain.  Very sensitive skin.  Weakness.  Not being able to move a part of the body (paralysis).  Muscle twitching.  Clumsiness or poor coordination.  Loss of balance.  Not being able to control your bladder.  Feeling dizzy.  Sexual problems. How is this diagnosed? Diagnosing and finding the cause of peripheral neuropathy can be difficult. Your health care provider will take your medical history and do a physical exam. A neurological exam will also be done. This involves checking things that are affected by your brain, spinal cord, and nerves (nervous  system). For example, your health care provider will check your reflexes, how you move, and what you can feel. You may have other tests, such as:  Blood tests.  Electromyogram (EMG) and nerve conduction tests. These tests check nerve function and how well the nerves are controlling the muscles.  Imaging tests, such as CT scans or MRI to rule out other causes of your symptoms.  Removing a small piece of nerve to be examined in a lab (nerve biopsy). This is rare.  Removing and examining a small amount of the fluid that surrounds the brain and spinal cord (lumbar puncture). This is rare. How is this treated? Treatment for this condition may involve:  Treating the underlying cause of the neuropathy, such as diabetes, kidney disease, or vitamin deficiencies.  Stopping medicines that can cause neuropathy, such as chemotherapy.  Medicine to relieve pain. Medicines may include: ? Prescription or over-the-counter pain medicine. ? Antiseizure medicine. ? Antidepressants. ? Pain-relieving patches that are applied to painful areas of skin.  Surgery to relieve pressure on a nerve  or to destroy a nerve that is causing pain.  Physical therapy to help improve movement and balance.  Devices to help you move around (assistive devices). Follow these instructions at home: Medicines  Take over-the-counter and prescription medicines only as told by your health care provider. Do not take any other medicines without first asking your health care provider.  Do not drive or use heavy machinery while taking prescription pain medicine. Lifestyle   Do not use any products that contain nicotine or tobacco, such as cigarettes and e-cigarettes. Smoking keeps blood from reaching damaged nerves. If you need help quitting, ask your health care provider.  Avoid or limit alcohol. Too much alcohol can cause a vitamin B deficiency, and vitamin B is needed for healthy nerves.  Eat a healthy diet. This includes: ? Eating foods that are high in fiber, such as fresh fruits and vegetables, whole grains, and beans. ? Limiting foods that are high in fat and processed sugars, such as fried or sweet foods. General instructions   If you have diabetes, work closely with your health care provider to keep your blood sugar under control.  If you have numbness in your feet: ? Check every day for signs of injury or infection. Watch for redness, warmth, and swelling. ? Wear padded socks and comfortable shoes. These help protect your feet.  Develop a good support system. Living with peripheral neuropathy can be stressful. Consider talking with a mental health specialist or joining a support group.  Use assistive devices and attend physical therapy as told by your health care provider. This may include using a walker or a cane.  Keep all follow-up visits as told by your health care provider. This is important. Contact a health care provider if:  You have new signs or symptoms of peripheral neuropathy.  You are struggling emotionally from dealing with peripheral neuropathy.  Your pain is not  well-controlled. Get help right away if:  You have an injury or infection that is not healing normally.  You develop new weakness in an arm or leg.  You fall frequently. Summary  Peripheral neuropathy is when the nerves in the arms, or legs are damaged, resulting in numbness, weakness, or pain.  There are many causes of peripheral neuropathy, including diabetes, pinched nerves, vitamin deficiencies, autoimmune disease, and hereditary conditions.  Diagnosing and finding the cause of peripheral neuropathy can be difficult. Your health care provider will take your medical  history, do a physical exam, and do tests, including blood tests and nerve function tests.  Treatment involves treating the underlying cause of the neuropathy and taking medicines to help control pain. Physical therapy and assistive devices may also help. This information is not intended to replace advice given to you by your health care provider. Make sure you discuss any questions you have with your health care provider. Document Revised: 11/01/2017 Document Reviewed: 01/28/2017 Elsevier Patient Education  2020 [employer].

## 2020-04-09 ENCOUNTER — Other Ambulatory Visit: Payer: Self-pay | Admitting: Physician Assistant

## 2020-04-14 NOTE — Progress Notes (Signed)
Subjective: Sierra Wright presents today at risk foot care with history of diabetic neuropathy and callus(es) left hallux and painful mycotic toenails b/l that are difficult to trim. Pain interferes with ambulation. Aggravating factors include wearing enclosed shoe gear. Pain is relieved with periodic professional debridement.  [name], MD is patient's PCP.   Mrs. Kimbrell daughter is accompanying her on today's visit. Daughter states Mom received her diabetic shoes, but does not like/wear them. Mrs. Noy feels the shoes are too heavy. States they received a bill for remaining balance and has paid it. She states she has not worn shoes outside.  Past Medical History:  Diagnosis Date  . Arthritis   . Coronary artery disease 1999   CABG  . Depression   . Diabetes mellitus without complication ([place])    type II; metformin and Amaryl  . Diabetic neuropathy ([place])   . History of anemia as a child   . HOH (hard of hearing)   . Hypertension   . Hypothyroidism    synthroid  . PONV (postoperative nausea and vomiting)   . Restless leg syndrome   . S/P cardiac cath 08/13/18 stable CAD 08/14/2018  . Shortness of breath 09/03/12   ECHO- The LA is Moderately Dilated, mild mitral annular calcification. mild pulmonary hypertension, moderate concentric LV hypertrophy. Atrial septum is aneurysmal. Aortic valve appears to be mildly sclerotic.EF 88%  . Urinary incontinence   . Weakness of both legs      Current Outpatient Medications on File Prior to Visit  Medication Sig Dispense Refill  . amLODipine (NORVASC) 5 MG tablet Take 1 tablet by mouth once daily 90 tablet 3  . Ascorbic Acid (VITAMIN C ER PO) Take 1,000 mg by mouth daily.     Marland Kitchen aspirin EC 81 MG EC tablet Take 1 tablet (81 mg total) by mouth daily.    . baclofen (LIORESAL) 10 MG tablet Take 0.5 - 1 tablet as needed for severe headache. 30 each 3  . benazepril (LOTENSIN) 40 MG tablet Take 1 tablet by mouth once daily 90 tablet 3   . Calcium Carb-Cholecalciferol (CALCIUM 600/VITAMIN D3) 600-800 MG-UNIT TABS Take 1 tablet by mouth 2 (two) times daily.    . carbidopa-levodopa (SINEMET IR) 25-100 MG tablet Take 1.5 tablets by mouth at bedtime. 135 tablet 3  . clopidogrel (PLAVIX) 75 MG tablet Take 1 tablet (75 mg total) by mouth daily. 90 tablet 3  . diazepam (VALIUM) 10 MG tablet Take 10 mg by mouth every 6 (six) hours as needed for anxiety.    . gabapentin (NEURONTIN) 100 MG capsule Take 200 mg by mouth at bedtime.    . Garlic 3267 MG CAPS Take 1,000 mg by mouth daily.     Marland Kitchen glimepiride (AMARYL) 1 MG tablet Take 1 mg by mouth 2 (two) times daily.    . hydrALAZINE (APRESOLINE) 25 MG tablet TAKE 1 TABLET BY MOUTH THREE TIMES DAILY 270 tablet 2  . insulin glargine (LANTUS) 100 UNIT/ML injection Inject 26 Units into the skin daily.     . isosorbide mononitrate (IMDUR) 30 MG 24 hr tablet Take 1 tablet (30 mg total) by mouth daily. 90 tablet 3  . levothyroxine (SYNTHROID, LEVOTHROID) 100 MCG tablet Take 100 mcg by mouth daily before breakfast.    . metoprolol succinate (TOPROL-XL) 25 MG 24 hr tablet Take 1 tablet (25 mg total) by mouth daily. 90 tablet 3  . nitroGLYCERIN (NITROSTAT) 0.4 MG SL tablet DISSOLVE ONE TABLET UNDER THE TONGUE EVERY 5  MINUTES AS NEEDED FOR CHEST PAIN. 25 tablet 3  . potassium chloride (K-DUR,KLOR-CON) 10 MEQ tablet Take 10-20 mEq by mouth 2 (two) times daily. Take 10 mg with breakfast and 20 mg with lunch    . sertraline (ZOLOFT) 50 MG tablet Take 50 mg by mouth daily.    . vitamin B-12 (CYANOCOBALAMIN) 1000 MCG tablet Take 1,000 mcg by mouth daily.     No current facility-administered medications on file prior to visit.     Allergies  Allergen Reactions  . Cymbalta [Duloxetine Hcl] Nausea And Vomiting  . Statins Other (See Comments)    Causes myalgias  . Amitriptyline Other (See Comments)    Pt. States that it caused it to be suicidal.   . Benadryl [Diphenhydramine Hcl (Sleep)] Other (See  Comments)    Makes the patient very "hyper"  . Tylenol [Acetaminophen] Nausea Only  . Welchol [Colesevelam Hcl] Other (See Comments)    Causes dizziness    Objective: Sierra Wright is a pleasant 86 y.o. y.o. Patient Race: White or Caucasian [1]  female WD, WD in NAD. AAO x 3.  There were no vitals filed for this visit.  Vascular Examination: Neurovascular status unchanged b/l. Capillary fill time to digits <3 seconds b/l. Faintly palpable pedal pulses b/l. Pedal hair sparse b/l. Skin temperature gradient within normal limits b/l. No edema noted b/l.  Dermatological Examination: Pedal skin is thin shiny, atrophic bilaterally. No open wounds bilaterally. No interdigital macerations bilaterally. Toenails 1-5 b/l elongated, dystrophic, thickened, crumbly with subungual debris and tenderness to dorsal palpation. Hyperkeratotic lesion(s) L hallux.  No erythema, no edema, no drainage, no flocculence.  Musculoskeletal: Normal muscle strength 5/5 to all lower extremity muscle groups bilaterally. No pain crepitus or joint limitation noted with ROM b/l. Hallux valgus with bunion deformity noted b/l. Utilizes wheelchair for mobility assistance.  Neurological Examination: Protective sensation decreased with 10 gram monofilament b/l. Vibratory sensation intact b/l. Proprioception intact bilaterally.  Assessment: 1. Pain due to onychomycosis of toenails of both feet   2. Callus   3. Diabetic polyneuropathy associated with diabetes mellitus due to underlying condition (HCC)    Plan: -Examined patient. -No new findings. No new orders. -Advised [name] to bring back her diabetic shoes to our O&P Department. She can exchange them for another shoe of her choice. She and her daughter related understanding. -Toenails 1-5 b/l were debrided in length and girth with sterile nail nippers and dremel without iatrogenic bleeding.  -Callus(es) L hallux pared utilizing sterile scalpel blade without  complication or incident. Total number debrided =1. -Patient to continue soft, supportive shoe gear daily. -Patient to report any pedal injuries to medical professional immediately. -Patient/POA to call should there be question/concern in the interim.  Return in about 3 months (around 07/07/2020) for diabetic nail and callus trim.  Freddie Breech, DPM

## 2020-04-20 ENCOUNTER — Other Ambulatory Visit: Payer: Medicare Other | Admitting: Orthotics

## 2020-04-20 ENCOUNTER — Other Ambulatory Visit: Payer: Self-pay

## 2020-05-25 ENCOUNTER — Other Ambulatory Visit: Payer: Self-pay

## 2020-05-25 ENCOUNTER — Ambulatory Visit: Payer: Medicare Other | Admitting: Orthotics

## 2020-05-25 DIAGNOSIS — M79675 Pain in left toe(s): Secondary | ICD-10-CM

## 2020-05-25 DIAGNOSIS — E0842 Diabetes mellitus due to underlying condition with diabetic polyneuropathy: Secondary | ICD-10-CM

## 2020-05-25 DIAGNOSIS — L84 Corns and callosities: Secondary | ICD-10-CM

## 2020-05-25 NOTE — Progress Notes (Signed)
Picked up reordered shoes. 

## 2020-07-06 ENCOUNTER — Ambulatory Visit: Payer: Medicare Other | Admitting: Podiatry

## 2020-07-06 ENCOUNTER — Encounter: Payer: Self-pay | Admitting: Podiatry

## 2020-07-06 ENCOUNTER — Other Ambulatory Visit: Payer: Self-pay

## 2020-07-06 DIAGNOSIS — E0842 Diabetes mellitus due to underlying condition with diabetic polyneuropathy: Secondary | ICD-10-CM | POA: Diagnosis not present

## 2020-07-06 DIAGNOSIS — M79674 Pain in right toe(s): Secondary | ICD-10-CM | POA: Diagnosis not present

## 2020-07-06 DIAGNOSIS — B351 Tinea unguium: Secondary | ICD-10-CM | POA: Diagnosis not present

## 2020-07-06 DIAGNOSIS — L84 Corns and callosities: Secondary | ICD-10-CM

## 2020-07-06 DIAGNOSIS — M79675 Pain in left toe(s): Secondary | ICD-10-CM | POA: Diagnosis not present

## 2020-07-08 NOTE — Progress Notes (Signed)
Subjective: Sierra Wright presents today at risk foot care with history of diabetic neuropathy and callus(es) left hallux and painful mycotic toenails b/l that are difficult to trim. Pain interferes with ambulation. Aggravating factors include wearing enclosed shoe gear. Pain is relieved with periodic professional debridement.  Sierra Wright is patient's PCP.   Sierra Wright daughter is accompanying her on today's visit. Daughter states Mom received a new pair of shoes and is pleased with the fit/comfort. She feels they are light enough.   Past Medical History:  Diagnosis Date  . Arthritis   . Coronary artery disease 1999   CABG  . Depression   . Diabetes mellitus without complication (HCC)    type II; metformin and Amaryl  . Diabetic neuropathy (HCC)   . History of anemia as a child   . HOH (hard of hearing)   . Hypertension   . Hypothyroidism    synthroid  . PONV (postoperative nausea and vomiting)   . Restless leg syndrome   . S/P cardiac cath 08/13/18 stable CAD 08/14/2018  . Shortness of breath 09/03/12   ECHO- The LA is Moderately Dilated, mild mitral annular calcification. mild pulmonary hypertension, moderate concentric LV hypertrophy. Atrial septum is aneurysmal. Aortic valve appears to be mildly sclerotic.EF 88%  . Urinary incontinence   . Weakness of both legs      Current Outpatient Medications on File Prior to Visit  Medication Sig Dispense Refill  . acetaminophen-codeine (TYLENOL #3) 300-30 MG tablet Take 1 tablet by mouth every 6 (six) hours as needed.    Marland Kitchen amLODipine (NORVASC) 5 MG tablet Take 1 tablet by mouth once daily 90 tablet 3  . Ascorbic Acid (VITAMIN C ER PO) Take 1,000 mg by mouth daily.     Marland Kitchen aspirin EC 81 MG EC tablet Take 1 tablet (81 mg total) by mouth daily.    . baclofen (LIORESAL) 10 MG tablet Take 0.5 - 1 tablet as needed for severe headache. 30 each 3  . benazepril (LOTENSIN) 40 MG tablet Take 1 tablet by mouth once daily 90 tablet 3   . Calcium Carb-Cholecalciferol (CALCIUM 600/VITAMIN D3) 600-800 MG-UNIT TABS Take 1 tablet by mouth 2 (two) times daily.    . carbidopa-levodopa (SINEMET IR) 25-100 MG tablet Take 1.5 tablets by mouth at bedtime. 135 tablet 3  . clopidogrel (PLAVIX) 75 MG tablet Take 1 tablet (75 mg total) by mouth daily. 90 tablet 3  . diazepam (VALIUM) 10 MG tablet Take 10 mg by mouth every 6 (six) hours as needed for anxiety.    . furosemide (LASIX) 40 MG tablet Take 1 tablet by mouth twice daily 180 tablet 0  . gabapentin (NEURONTIN) 100 MG capsule Take 200 mg by mouth at bedtime.    . Garlic 1000 MG CAPS Take 1,000 mg by mouth daily.     Marland Kitchen glimepiride (AMARYL) 1 MG tablet Take 1 mg by mouth 2 (two) times daily.    . hydrALAZINE (APRESOLINE) 25 MG tablet TAKE 1 TABLET BY MOUTH THREE TIMES DAILY 270 tablet 2  . insulin glargine (LANTUS) 100 UNIT/ML injection Inject 26 Units into the skin daily.     . isosorbide mononitrate (IMDUR) 30 MG 24 hr tablet Take 1 tablet (30 mg total) by mouth daily. 90 tablet 3  . levothyroxine (SYNTHROID, LEVOTHROID) 100 MCG tablet Take 100 mcg by mouth daily before breakfast.    . metoprolol succinate (TOPROL-XL) 25 MG 24 hr tablet Take 1 tablet (25 mg total) by mouth  daily. 90 tablet 3  . nitroGLYCERIN (NITROSTAT) 0.4 MG SL tablet DISSOLVE ONE TABLET UNDER THE TONGUE EVERY 5 MINUTES AS NEEDED FOR CHEST PAIN. 25 tablet 3  . potassium chloride (K-DUR,KLOR-CON) 10 MEQ tablet Take 10-20 mEq by mouth 2 (two) times daily. Take 10 mg with breakfast and 20 mg with lunch    . sertraline (ZOLOFT) 50 MG tablet Take 50 mg by mouth daily.    . vitamin B-12 (CYANOCOBALAMIN) 1000 MCG tablet Take 1,000 mcg by mouth daily.     No current facility-administered medications on file prior to visit.     Allergies  Allergen Reactions  . Cymbalta [Duloxetine Hcl] Nausea And Vomiting  . Statins Other (See Comments)    Causes myalgias  . Amitriptyline Other (See Comments)    Pt. States that it  caused it to be suicidal.   . Benadryl [Diphenhydramine Hcl (Sleep)] Other (See Comments)    Makes the patient very "hyper"  . Tylenol [Acetaminophen] Nausea Only  . Welchol [Colesevelam Hcl] Other (See Comments)    Causes dizziness    Objective: Sierra Wright is a pleasant 86 y.o. y.o. Patient Race: White or Caucasian [1]  female WD, WD in NAD. AAO x 3.  There were no vitals filed for this visit.  Vascular Examination: Neurovascular status unchanged b/l. Capillary fill time to digits <3 seconds b/l. Faintly palpable pedal pulses b/l. Pedal hair sparse b/l. Skin temperature gradient within normal limits b/l. No edema noted b/l.  Dermatological Examination: Pedal skin is thin shiny, atrophic bilaterally. No open wounds bilaterally. No interdigital macerations bilaterally. Toenails 1-5 b/l elongated, dystrophic, thickened, crumbly with subungual debris and tenderness to dorsal palpation. Hyperkeratotic lesion(s) sub L hallux IPJ.  No erythema, no edema, no drainage, no flocculence.  Musculoskeletal: Normal muscle strength 5/5 to all lower extremity muscle groups bilaterally. No pain crepitus or joint limitation noted with ROM b/l. Hallux valgus with bunion deformity noted b/l. Utilizes wheelchair for mobility assistance.  Neurological Examination: Protective sensation decreased with 10 gram monofilament b/l. Vibratory sensation intact b/l. Proprioception intact bilaterally.  Assessment: 1. Pain due to onychomycosis of toenails of both feet   2. Callus   3. Diabetic polyneuropathy associated with diabetes mellitus due to underlying condition (HCC)     Plan: -Examined patient. -No new findings. No new orders. -Sierra Wright received a new pair of diabetic shoes and is pleased with them. -Toenails 1-5 b/l were debrided in length and girth with sterile nail nippers and dremel without iatrogenic bleeding.  -Callus(es) L hallux pared utilizing sterile scalpel blade without  complication or incident. Total number debrided =1. -Patient to continue soft, supportive shoe gear daily. -Patient to report any pedal injuries to medical professional immediately. -Patient/POA to call should there be question/concern in the interim.  Return in about 3 months (around 10/06/2020) for nail and callus trim.  Freddie Breech, DPM

## 2020-07-15 ENCOUNTER — Other Ambulatory Visit: Payer: Self-pay | Admitting: Cardiovascular Disease

## 2020-07-15 ENCOUNTER — Other Ambulatory Visit: Payer: Self-pay | Admitting: Physician Assistant

## 2020-07-15 DIAGNOSIS — I10 Essential (primary) hypertension: Secondary | ICD-10-CM

## 2020-07-15 DIAGNOSIS — I5032 Chronic diastolic (congestive) heart failure: Secondary | ICD-10-CM

## 2020-07-15 DIAGNOSIS — R252 Cramp and spasm: Secondary | ICD-10-CM

## 2020-07-15 DIAGNOSIS — I25810 Atherosclerosis of coronary artery bypass graft(s) without angina pectoris: Secondary | ICD-10-CM

## 2020-08-03 ENCOUNTER — Encounter: Payer: Self-pay | Admitting: Physician Assistant

## 2020-08-03 ENCOUNTER — Ambulatory Visit: Payer: Medicare Other | Admitting: Physician Assistant

## 2020-08-03 ENCOUNTER — Other Ambulatory Visit: Payer: Self-pay

## 2020-08-03 VITALS — BP 132/52 | HR 62 | Ht 66.0 in | Wt 217.0 lb

## 2020-08-03 DIAGNOSIS — E782 Mixed hyperlipidemia: Secondary | ICD-10-CM | POA: Diagnosis not present

## 2020-08-03 DIAGNOSIS — I25709 Atherosclerosis of coronary artery bypass graft(s), unspecified, with unspecified angina pectoris: Secondary | ICD-10-CM | POA: Diagnosis not present

## 2020-08-03 DIAGNOSIS — I5032 Chronic diastolic (congestive) heart failure: Secondary | ICD-10-CM

## 2020-08-03 DIAGNOSIS — I10 Essential (primary) hypertension: Secondary | ICD-10-CM | POA: Diagnosis not present

## 2020-08-03 NOTE — Patient Instructions (Addendum)
Medication Instructions:  Your physician recommends that you continue on your current medications as directed. Please refer to the Current Medication list given to you today.  *If you need a refill on your cardiac medications before your next appointment, please call your pharmacy*  Lab Work: None ordered today  Testing/Procedures: None ordered today  Follow-Up: On 02/03/2021 at 1:20PM with Kristeen Miss, MD

## 2020-08-03 NOTE — Progress Notes (Signed)
Cardiology Office Note:    Date:  08/03/2020   ID:  [name], DOB [birth year], MRN [id number]  PCP:  [name], MD  [facility] Cardiologist:  Mertie Moores, MD  [facility] Electrophysiologist:  None   Referring MD: [name], MD   Chief Complaint:  Follow-up (CAD, CHF)    Patient Profile:    Sierra Wright is a 86 y.o. female with:   Coronary artery disease   S/p CABG 1999  Myoview 11/17: no ischemia  Cath 9/19: 2/3 grafts patent (S-OM occluded), OM fills from L-L collaterals -no targets for PCI, medical therapy  Diastolic CHF  Echo 7/51: EF 60-65  Hypertension   Hyperlipidemia   Intol of statins  Diabetes mellitus   Hypothyroidism   Prior CV studies: Cardiac catheterization 09-11-18 LAD ostial 99, proximal 100-CTO; D1 ostial 99 LCx proximal 100-CTO; OM2 ostial 99 RCA ostial 99, distal 100-CTO SVG-RCA patent SVG-OM2 occluded LIMA-LAD patent  Echocardiogram 08/12/2018 Mild LVH, EF 60-65, normal wall motion, mild MAC  Myoview 10/12/2016 EF 60, no ischemia or scar, low risk  Carotid US 07/14/2016 Bilateral ICA 1-39  History of Present Illness:    Ms. Schlafer was last seen by Dr. Acie Fredrickson in 02/2020.  She returns for follow-up.  She is here with her daughter.  She has a lot of problems with her legs related to neuropathy.  She is unable to get around on her own.  She uses a wheelchair most of the time.  She has had a couple of episodes of chest pain.  These were brief.  She did not have to take nitroglycerin.  She has not had orthopnea, PND or significant leg swelling.  She has not had syncope.  She has not had any bleeding issues.      Past Medical History:  Diagnosis Date  . Arthritis   . Coronary artery disease 1999   CABG  . Depression   . Diabetes mellitus without complication ([place])    type II; metformin and Amaryl  . Diabetic neuropathy ([place])   . History of anemia as a child   . HOH (hard of hearing)   . Hypertension    . Hypothyroidism    synthroid  . PONV (postoperative nausea and vomiting)   . Restless leg syndrome   . S/P cardiac cath 2018-09-11 stable CAD 08/14/2018  . Shortness of breath 09/03/12   ECHO- The LA is Moderately Dilated, mild mitral annular calcification. mild pulmonary hypertension, moderate concentric LV hypertrophy. Atrial septum is aneurysmal. Aortic valve appears to be mildly sclerotic.EF 88%  . Urinary incontinence   . Weakness of both legs     Current Medications: Current Meds  Medication Sig  . acetaminophen-codeine (TYLENOL #3) 300-30 MG tablet Take 1 tablet by mouth every 6 (six) hours as needed.  Marland Kitchen amLODipine (NORVASC) 5 MG tablet Take 1 tablet by mouth once daily  . Ascorbic Acid (VITAMIN C ER PO) Take 1,000 mg by mouth daily.   Marland Kitchen aspirin EC 81 MG EC tablet Take 1 tablet (81 mg total) by mouth daily.  . baclofen (LIORESAL) 10 MG tablet Take 0.5 - 1 tablet as needed for severe headache.  . benazepril (LOTENSIN) 40 MG tablet Take 1 tablet by mouth once daily  . Calcium Carb-Cholecalciferol (CALCIUM 600/VITAMIN D3) 600-800 MG-UNIT TABS Take 1 tablet by mouth 2 (two) times daily.  . carbidopa-levodopa (SINEMET IR) 25-100 MG tablet Take 1.5 tablets by mouth at bedtime.  . clopidogrel (PLAVIX) 75 MG tablet Take  1 tablet (75 mg total) by mouth daily.  . diazepam (VALIUM) 10 MG tablet Take 10 mg by mouth every 6 (six) hours as needed for anxiety.  . furosemide (LASIX) 40 MG tablet Take 1 tablet by mouth twice daily  . gabapentin (NEURONTIN) 100 MG capsule Take 200 mg by mouth at bedtime.  . Garlic 4656 MG CAPS Take 1,000 mg by mouth daily.   Marland Kitchen glimepiride (AMARYL) 1 MG tablet Take 1 mg by mouth daily with breakfast.  . hydrALAZINE (APRESOLINE) 25 MG tablet TAKE 1 TABLET BY MOUTH THREE TIMES DAILY  . insulin glargine (LANTUS) 100 UNIT/ML injection Inject 26 Units into the skin daily.   . isosorbide mononitrate (IMDUR) 30 MG 24 hr tablet Take 1 tablet by mouth once daily  .  levothyroxine (SYNTHROID, LEVOTHROID) 100 MCG tablet Take 100 mcg by mouth daily before breakfast.  . metoprolol succinate (TOPROL-XL) 25 MG 24 hr tablet Take 1 tablet (25 mg total) by mouth daily.  . nitroGLYCERIN (NITROSTAT) 0.4 MG SL tablet DISSOLVE ONE TABLET UNDER THE TONGUE EVERY 5 MINUTES AS NEEDED FOR CHEST PAIN.  Marland Kitchen potassium chloride (K-DUR,KLOR-CON) 10 MEQ tablet Take 10-20 mEq by mouth 2 (two) times daily.   . sertraline (ZOLOFT) 50 MG tablet Take 50 mg by mouth daily.  . vitamin B-12 (CYANOCOBALAMIN) 1000 MCG tablet Take 1,000 mcg by mouth daily.     Allergies:   Cymbalta [duloxetine hcl], Statins, Amitriptyline, Benadryl [diphenhydramine hcl (sleep)], Tylenol [acetaminophen], and Welchol [colesevelam hcl]   Social History   Tobacco Use  . Smoking status: Never Smoker  . Smokeless tobacco: Never Used  Vaping Use  . Vaping Use: Never used  Substance Use Topics  . Alcohol use: No  . Drug use: No     Family Hx: The patient's family history includes Healthy in her daughter; Heart attack in her father; Heart attack (age of onset: 19) in her brother; Liver cancer in her sister; Other in her mother; Pneumonia in her mother; Stroke in her sister; Stroke (age of onset: 52) in her son.  ROS   EKGs/Labs/Other Test Reviewed:    EKG:  EKG is   ordered today.  The ekg ordered today demonstrates normal sinus rhythm, heart rate 62, normal axis, right bundle branch block, first-degree AV block, PR interval 384, QTc 468, nonspecific ST-T wave changes, no change from prior tracing  Recent Labs: 01/20/2020: B Natriuretic Peptide 131.8; BUN 17; Creatinine, Ser 1.21; Hemoglobin 13.6; Platelets 213; Potassium 3.8; Sodium 140   Recent Lipid Panel Lab Results  Component Value Date/Time   CHOL 149 08/12/2018 02:36 AM   CHOL 183 08/11/2018 12:54 PM   TRIG 171 (H) 08/12/2018 02:36 AM   HDL 34 (L) 08/12/2018 02:36 AM   HDL 45 08/11/2018 12:54 PM   CHOLHDL 4.4 08/12/2018 02:36 AM   LDLCALC 81  08/12/2018 02:36 AM   LDLCALC 93 08/11/2018 12:54 PM    Physical Exam:    VS:  BP (!) 132/52   Pulse 62   Ht _0  (1.676 m)   Wt 217 lb (98.4 kg)   SpO2 97%   BMI 35.02 kg/m     Wt Readings from Last 3 Encounters:  08/03/20 217 lb (98.4 kg)  02/03/20 215 lb (97.5 kg)  01/20/20 210 lb (95.3 kg)     Constitutional:      Appearance: Healthy appearance. Not in distress.  Neck:     Vascular: No JVR.  Pulmonary:     Effort: Pulmonary effort  is normal.     Breath sounds: No wheezing. No rales.  Cardiovascular:     Normal rate. Regular rhythm. Normal S1. Normal S2.     Murmurs: There is no murmur.  Edema:    Peripheral edema absent.  Abdominal:     General: There is no distension.     Palpations: Abdomen is soft.  Skin:    General: Skin is warm and dry.  Neurological:     Mental Status: Alert and oriented to person, place and time.     Cranial Nerves: Cranial nerves are intact.       ASSESSMENT & PLAN:    1. Coronary artery disease involving coronary bypass graft of native heart with angina pectoris ([place]) History of CABG in 1999.  Cardiac catheterization 2019 demonstrated 2/3 grafts patent.  She is currently stable on her medical regimen without significant anginal symptoms.  Continue amlodipine, aspirin, clopidogrel, isosorbide, metoprolol succinate.  Follow-up in 6 months with Dr. Acie Fredrickson.  2. Chronic diastolic CHF (congestive heart failure) (HCC) Echocardiogram in 2019 with EF 60-65.  She is significantly limited by peripheral neuropathy and is basically wheelchair-bound.  Therefore, functional status is mainly impacted by her difficulty with ambulation.  NYHA III.  Volume status stable.  Creatinine stable in February 2021.  Continue current dose of furosemide.  3. Essential hypertension The patient's blood pressure is controlled on her current regimen.  Continue current therapy.   4. Mixed hyperlipidemia She is intolerant to cholesterol-lowering medications.     Dispo:  Return in about 6 months (around 01/31/2021) for Routine Follow Up, w/ Dr. Acie Fredrickson, in person.   Medication Adjustments/Labs and Tests Ordered: Current medicines are reviewed at length with the patient today.  Concerns regarding medicines are outlined above.  Tests Ordered: Orders Placed This Encounter  Procedures  . EKG 12-Lead   Medication Changes: No orders of the defined types were placed in this encounter.   Signed, Richardson Dopp, PA-C  08/03/2020 4:11 PM    [facility] [address], [place], [place]  [address] Phone: [phone]; Fax: [phone]

## 2020-10-12 ENCOUNTER — Other Ambulatory Visit: Payer: Self-pay

## 2020-10-12 ENCOUNTER — Encounter: Payer: Self-pay | Admitting: Podiatry

## 2020-10-12 ENCOUNTER — Ambulatory Visit: Payer: Medicare Other | Admitting: Podiatry

## 2020-10-12 DIAGNOSIS — E0842 Diabetes mellitus due to underlying condition with diabetic polyneuropathy: Secondary | ICD-10-CM

## 2020-10-12 DIAGNOSIS — L84 Corns and callosities: Secondary | ICD-10-CM | POA: Diagnosis not present

## 2020-10-12 DIAGNOSIS — B351 Tinea unguium: Secondary | ICD-10-CM

## 2020-10-12 DIAGNOSIS — M79674 Pain in right toe(s): Secondary | ICD-10-CM | POA: Diagnosis not present

## 2020-10-12 DIAGNOSIS — M79675 Pain in left toe(s): Secondary | ICD-10-CM

## 2020-10-16 NOTE — Progress Notes (Signed)
Subjective: Sierra Wright presents today at risk foot care with history of diabetic neuropathy and callus(es) left hallux and painful mycotic toenails b/l that are difficult to trim. Pain interferes with ambulation. Aggravating factors include wearing enclosed shoe gear. Pain is relieved with periodic professional debridement.  [name], MD is patient's PCP.   Mrs. Ware daughter is accompanying her on today's visit. They voice no new pedal problems on today's visit.  Past Medical History:  Diagnosis Date  . Arthritis   . Coronary artery disease 1999   CABG  . Depression   . Diabetes mellitus without complication (HCC)    type II; metformin and Amaryl  . Diabetic neuropathy (HCC)   . History of anemia as a child   . HOH (hard of hearing)   . Hypertension   . Hypothyroidism    synthroid  . PONV (postoperative nausea and vomiting)   . Restless leg syndrome   . S/P cardiac cath 08/13/18 stable CAD 08/14/2018  . Shortness of breath 09/03/12   ECHO- The LA is Moderately Dilated, mild mitral annular calcification. mild pulmonary hypertension, moderate concentric LV hypertrophy. Atrial septum is aneurysmal. Aortic valve appears to be mildly sclerotic.EF 88%  . Urinary incontinence   . Weakness of both legs      Current Outpatient Medications on File Prior to Visit  Medication Sig Dispense Refill  . acetaminophen-codeine (TYLENOL #3) 300-30 MG tablet Take 1 tablet by mouth every 6 (six) hours as needed.    Marland Kitchen amLODipine (NORVASC) 5 MG tablet Take 1 tablet by mouth once daily 90 tablet 3  . Ascorbic Acid (VITAMIN C ER PO) Take 1,000 mg by mouth daily.     Marland Kitchen aspirin EC 81 MG EC tablet Take 1 tablet (81 mg total) by mouth daily.    . baclofen (LIORESAL) 10 MG tablet Take 0.5 - 1 tablet as needed for severe headache. 30 each 3  . benazepril (LOTENSIN) 40 MG tablet Take 1 tablet by mouth once daily 90 tablet 3  . Calcium Carb-Cholecalciferol (CALCIUM 600/VITAMIN D3) 600-800  MG-UNIT TABS Take 1 tablet by mouth 2 (two) times daily.    . carbidopa-levodopa (SINEMET IR) 25-100 MG tablet Take 1.5 tablets by mouth at bedtime. 135 tablet 3  . clopidogrel (PLAVIX) 75 MG tablet Take 1 tablet (75 mg total) by mouth daily. 90 tablet 3  . diazepam (VALIUM) 10 MG tablet Take 10 mg by mouth every 6 (six) hours as needed for anxiety.    . furosemide (LASIX) 40 MG tablet Take 1 tablet by mouth twice daily 180 tablet 3  . gabapentin (NEURONTIN) 100 MG capsule Take 200 mg by mouth at bedtime.    . Garlic 1000 MG CAPS Take 1,000 mg by mouth daily.     Marland Kitchen glimepiride (AMARYL) 1 MG tablet Take 1 mg by mouth daily with breakfast.    . hydrALAZINE (APRESOLINE) 25 MG tablet TAKE 1 TABLET BY MOUTH THREE TIMES DAILY 270 tablet 2  . insulin glargine (LANTUS) 100 UNIT/ML injection Inject 26 Units into the skin daily.     . isosorbide mononitrate (IMDUR) 30 MG 24 hr tablet Take 1 tablet by mouth once daily 90 tablet 3  . levothyroxine (SYNTHROID, LEVOTHROID) 100 MCG tablet Take 100 mcg by mouth daily before breakfast.    . metoprolol succinate (TOPROL-XL) 25 MG 24 hr tablet Take 1 tablet (25 mg total) by mouth daily. 90 tablet 3  . nitroGLYCERIN (NITROSTAT) 0.4 MG SL tablet DISSOLVE ONE TABLET UNDER  THE TONGUE EVERY 5 MINUTES AS NEEDED FOR CHEST PAIN. 25 tablet 3  . potassium chloride (K-DUR,KLOR-CON) 10 MEQ tablet Take 10-20 mEq by mouth 2 (two) times daily.     . sertraline (ZOLOFT) 50 MG tablet Take 50 mg by mouth daily.    . vitamin B-12 (CYANOCOBALAMIN) 1000 MCG tablet Take 1,000 mcg by mouth daily.     No current facility-administered medications on file prior to visit.     Allergies  Allergen Reactions  . Cymbalta [Duloxetine Hcl] Nausea And Vomiting  . Statins Other (See Comments)    Causes myalgias  . Amitriptyline Other (See Comments)    Pt. States that it caused it to be suicidal.   . Benadryl [Diphenhydramine Hcl (Sleep)] Other (See Comments)    Makes the patient very  "hyper"  . Tylenol [Acetaminophen] Nausea Only  . Welchol [Colesevelam Hcl] Other (See Comments)    Causes dizziness    Objective: ANDERSEN MCKIVER is a pleasant 86 y.o.  Caucasian female in NAD. AAO x 3.   There were no vitals filed for this visit.  Vascular Examination: Neurovascular status unchanged b/l. Capillary fill time to digits <3 seconds b/l. Faintly palpable pedal pulses b/l. Pedal hair sparse b/l. Skin temperature gradient within normal limits b/l. No edema noted b/l.  Dermatological Examination: Pedal skin is thin shiny, atrophic bilaterally. No open wounds bilaterally. No interdigital macerations bilaterally. Toenails 1-5 b/l elongated, dystrophic, thickened, crumbly with subungual debris and tenderness to dorsal palpation. Hyperkeratotic lesion(s) sub L hallux IPJ.  No erythema, no edema, no drainage, no flocculence.  Musculoskeletal: Normal muscle strength 5/5 to all lower extremity muscle groups bilaterally. No pain crepitus or joint limitation noted with ROM b/l. Hallux valgus with bunion deformity noted b/l. Utilizes wheelchair for mobility assistance.  Neurological Examination: Protective sensation decreased with 10 gram monofilament b/l. Vibratory sensation intact b/l. Proprioception intact bilaterally.  Assessment: 1. Pain due to onychomycosis of toenails of both feet   2. Callus   3. Diabetic polyneuropathy associated with diabetes mellitus due to underlying condition (HCC)     Plan: -Examined patient. -No new findings. No new orders. -Continue diabetic foot care principles. -Toenails 1-5 b/l were debrided in length and girth with sterile nail nippers and dremel without iatrogenic bleeding.  -Callus(es) L hallux pared utilizing sterile scalpel blade without complication or incident. Total number debrided =1. -Patient to continue soft, supportive shoe gear daily. -Patient to report any pedal injuries to medical professional immediately. -Patient/POA to call  should there be question/concern in the interim.  Return in about 3 months (around 01/12/2021) for diabetic foot care.  Freddie Breech, DPM

## 2020-10-28 ENCOUNTER — Other Ambulatory Visit: Payer: Self-pay | Admitting: Cardiovascular Disease

## 2020-10-28 DIAGNOSIS — R252 Cramp and spasm: Secondary | ICD-10-CM

## 2020-10-28 DIAGNOSIS — I10 Essential (primary) hypertension: Secondary | ICD-10-CM

## 2020-10-28 DIAGNOSIS — I25810 Atherosclerosis of coronary artery bypass graft(s) without angina pectoris: Secondary | ICD-10-CM

## 2020-10-28 DIAGNOSIS — I5032 Chronic diastolic (congestive) heart failure: Secondary | ICD-10-CM

## 2020-11-18 ENCOUNTER — Ambulatory Visit: Payer: Medicare Other | Admitting: Neurology

## 2020-11-21 ENCOUNTER — Other Ambulatory Visit: Payer: Self-pay | Admitting: Cardiovascular Disease

## 2020-12-16 ENCOUNTER — Other Ambulatory Visit: Payer: Self-pay

## 2020-12-16 ENCOUNTER — Ambulatory Visit: Payer: Medicare Other | Admitting: Neurology

## 2020-12-16 ENCOUNTER — Encounter: Payer: Self-pay | Admitting: Neurology

## 2020-12-16 VITALS — BP 116/69 | HR 62 | Ht 66.0 in | Wt 210.0 lb

## 2020-12-16 DIAGNOSIS — G44209 Tension-type headache, unspecified, not intractable: Secondary | ICD-10-CM | POA: Diagnosis not present

## 2020-12-16 DIAGNOSIS — E0842 Diabetes mellitus due to underlying condition with diabetic polyneuropathy: Secondary | ICD-10-CM | POA: Diagnosis not present

## 2020-12-16 DIAGNOSIS — G2581 Restless legs syndrome: Secondary | ICD-10-CM

## 2020-12-16 DIAGNOSIS — R29898 Other symptoms and signs involving the musculoskeletal system: Secondary | ICD-10-CM | POA: Diagnosis not present

## 2020-12-16 MED ORDER — CARBIDOPA-LEVODOPA 25-100 MG PO TABS: 1.5000 | ORAL_TABLET | Freq: Every day | ORAL | 3 refills | Status: AC

## 2020-12-16 MED ORDER — GABAPENTIN 100 MG PO CAPS: 100.0000 mg | ORAL_CAPSULE | Freq: Three times a day (TID) | ORAL | 3 refills | Status: DC

## 2020-12-16 NOTE — Patient Instructions (Signed)
Increase gabapentin to 100mg  three times daily  We will order nerve testing of the right hand  Continue sinemet 1.5 tab at bedtime  Return in 1 year

## 2020-12-16 NOTE — Progress Notes (Signed)
Follow-up Visit   Date: 12/16/20    Sierra Wright MRN: [id number] DOB: 05-Apr-1934   Interim History: Sierra Wright is a 87 y.o. right-handed Caucasian female with diabetes mellitus, hypertension, hyperlipidemia, hypothyroidism, CAD s/p CABG, depression/anxiety, CKD stage III, hearing deficit, and recent R thalamic ischemic stroke (no residual deficits) returning to the clinic for follow-up of RLS and neuropathy.  The patient was accompanied to the clinic by daughter who also provides collateral information.    Over the past year, she reports worsening pain from neuropathy and has needed to take gabapentin 100mg  three times which has helped. She is predominately confined to wheelchair, but able to transfer herself.  She lives with husband and is able to perform all ADLs, manages her own medications and meals.   RLS is well-controlled on sinemet 1.5 tab at time.  Requip and Lyrica were ineffective.  Right hand numbness and tingling is getting worse and it often wake her up from sleeping. Grip is weaker.   Medications:  Current Outpatient Medications on File Prior to Visit  Medication Sig Dispense Refill  . acetaminophen-codeine (TYLENOL #3) 300-30 MG tablet Take 1 tablet by mouth every 6 (six) hours as needed.    amLODipine (NORVASC) 5 MG tablet Take 1 tablet by mouth once daily 90 tablet 3  . Ascorbic Acid (VITAMIN C ER PO) Take 1,000 mg by mouth daily.     Marland Kitchen aspirin EC 81 MG EC tablet Take 1 tablet (81 mg total) by mouth daily.    . baclofen (LIORESAL) 10 MG tablet Take 0.5 - 1 tablet as needed for severe headache. 30 each 3  . benazepril (LOTENSIN) 40 MG tablet Take 1 tablet by mouth once daily 90 tablet 3  . Calcium Carb-Cholecalciferol 600-800 MG-UNIT TABS Take 1 tablet by mouth 2 (two) times daily.    . carbidopa-levodopa (SINEMET IR) 25-100 MG tablet Take 1.5 tablets by mouth at bedtime. 135 tablet 3  . clopidogrel (PLAVIX) 75 MG tablet Take 1 tablet by mouth once  daily 90 tablet 2  . diazepam (VALIUM) 10 MG tablet Take 10 mg by mouth every 6 (six) hours as needed for anxiety.    . furosemide (LASIX) 40 MG tablet Take 1 tablet by mouth twice daily 180 tablet 3  . gabapentin (NEURONTIN) 100 MG capsule Take 200 mg by mouth at bedtime.    . Garlic 1000 MG CAPS Take 1,000 mg by mouth daily.     Marland Kitchen glimepiride (AMARYL) 1 MG tablet Take 1 mg by mouth daily with breakfast.    . hydrALAZINE (APRESOLINE) 25 MG tablet TAKE 1 TABLET BY MOUTH THREE TIMES DAILY 270 tablet 2  . insulin glargine (LANTUS) 100 UNIT/ML injection Inject 26 Units into the skin daily.     . isosorbide mononitrate (IMDUR) 30 MG 24 hr tablet Take 1 tablet by mouth once daily 90 tablet 3  . levothyroxine (SYNTHROID, LEVOTHROID) 100 MCG tablet Take 100 mcg by mouth daily before breakfast.    . metoprolol succinate (TOPROL-XL) 25 MG 24 hr tablet Take 1 tablet (25 mg total) by mouth daily. 90 tablet 3  . nitroGLYCERIN (NITROSTAT) 0.4 MG SL tablet DISSOLVE ONE TABLET UNDER THE TONGUE EVERY 5 MINUTES AS NEEDED FOR CHEST PAIN. 25 tablet 3  . potassium chloride (K-DUR,KLOR-CON) 10 MEQ tablet Take 10-20 mEq by mouth 2 (two) times daily.     . sertraline (ZOLOFT) 50 MG tablet Take 50 mg by mouth daily.    . vitamin  B-12 (CYANOCOBALAMIN) 1000 MCG tablet Take 1,000 mcg by mouth daily.     No current facility-administered medications on file prior to visit.    Allergies:  Allergies  Allergen Reactions  . Cymbalta [Duloxetine Hcl] Nausea And Vomiting  . Statins Other (See Comments)    Causes myalgias  . Amitriptyline Other (See Comments)    Pt. States that it caused it to be suicidal.   . Benadryl [Diphenhydramine Hcl (Sleep)] Other (See Comments)    Makes the patient very "hyper"  . Tylenol [Acetaminophen] Nausea Only  . Welchol [Colesevelam Hcl] Other (See Comments)    Causes dizziness    Vital Signs:  BP 116/69   Pulse 62   Ht 5\' 6"  (1.676 m)   Wt 210 lb (95.3 kg)   SpO2 94%   BMI 33.89  kg/m   Neurological Exam: MENTAL STATUS including orientation to time, place, person, recent and remote memory, attention span and concentration, language, and fund of knowledge is normal.  Speech is not dysarthric.  CRANIAL NERVES:   Bilateral surgical pupils. Normal conjugate, extra-ocular eye movements in all directions of gaze.  No ptosis.   MOTOR:  Motor strength is 5-/5 in all extremities, mild give-way weakness in the legs.  Mild ABP weakness on the right.   MSRs:  Reflexes are 2+/4 in the upper extremities and absent in the legs.  SENSORY:  Absent vibration distal to knees bilaterally, intact at the MCPs  COORDINATION/GAIT:  Gait not tested due to being wheelchair bound  Data: MRA brain and neck 07/15/2016: Mild atherosclerotic disease in the carotid bulb with 30% diameter stenosis on the right and 25% diameter stenosis on the left  Moderate stenosis distal left vertebral artery due to atherosclerotic disease.  Moderate intracranial atherosclerotic disease as described above. MRI brain 07/13/2016: 1. 9 mm early subacute ischemic nonhemorrhagic infarct involving the medial right thalamus. No associated mass effect. 2. No other acute intracranial process identified. 3. Mild age-related cerebral atrophy with chronic small vessel ischemic disease.  CT cervical spine 07/13/2016: 1. No acute intracranial abnormality or significant interval change. 2. Stable atrophy and white matter disease. This likely reflects the sequela of chronic microvascular ischemia. 3. Moderate spondylosis in the cervical spine as described. 4. No acute fracture or traumatic subluxation in the cervical spine.  CT head 07/13/2016: 1. No acute intracranial abnormality or significant interval change. 2. Stable atrophy and white matter disease. This likely reflects the sequela of chronic microvascular ischemia. 3. Moderate spondylosis in the cervical spine as described. 4. No acute fracture or traumatic  subluxation in the cervical spine.  09/12/2016 carotids 1-39% bilaterally Echo: Normal   IMPRESSION/PLAN: 1.  Diabetic polyneuropathy, worsening painful paresthesias  - Increase gabapentin 300mg  at bedtime.  No further titration  2.  Restless leg syndrome, well-controlled on sinemet 25/100mg  at bedtime  3.  Right hand paresthesias ? CTS   - NCS/EMG of the right hand  - Start using a wrist splint  4.  Right thalamic stroke due to small vessel disease, no residual deficits (2017).    - Continue plavix 75mg  daily  - statin allergy (LDL81)  5.  Tension headaches  - D/C tizanidine (no benefit), hopefully increasing gabapentin will help  Return to clinic in 1 year   Thank you for allowing me to participate in patient's care.  If I can answer any additional questions, I would be pleased to do so.    Sincerely,    Tremond Shimabukuro K. , DO

## 2020-12-19 ENCOUNTER — Ambulatory Visit: Payer: Medicare Other | Admitting: Neurology

## 2020-12-28 DIAGNOSIS — G5791 Unspecified mononeuropathy of right lower limb: Secondary | ICD-10-CM | POA: Diagnosis not present

## 2020-12-28 DIAGNOSIS — I10 Essential (primary) hypertension: Secondary | ICD-10-CM | POA: Diagnosis not present

## 2020-12-28 DIAGNOSIS — E1165 Type 2 diabetes mellitus with hyperglycemia: Secondary | ICD-10-CM | POA: Diagnosis not present

## 2020-12-28 DIAGNOSIS — E1140 Type 2 diabetes mellitus with diabetic neuropathy, unspecified: Secondary | ICD-10-CM | POA: Diagnosis not present

## 2020-12-28 DIAGNOSIS — E039 Hypothyroidism, unspecified: Secondary | ICD-10-CM | POA: Diagnosis not present

## 2021-01-25 ENCOUNTER — Ambulatory Visit: Payer: Medicare Other | Admitting: Podiatry

## 2021-01-25 ENCOUNTER — Encounter: Payer: Self-pay | Admitting: Podiatry

## 2021-01-25 ENCOUNTER — Other Ambulatory Visit: Payer: Self-pay

## 2021-01-25 DIAGNOSIS — M79675 Pain in left toe(s): Secondary | ICD-10-CM | POA: Diagnosis not present

## 2021-01-25 DIAGNOSIS — E0842 Diabetes mellitus due to underlying condition with diabetic polyneuropathy: Secondary | ICD-10-CM

## 2021-01-25 DIAGNOSIS — E119 Type 2 diabetes mellitus without complications: Secondary | ICD-10-CM | POA: Diagnosis not present

## 2021-01-25 DIAGNOSIS — M2012 Hallux valgus (acquired), left foot: Secondary | ICD-10-CM | POA: Diagnosis not present

## 2021-01-25 DIAGNOSIS — M2042 Other hammer toe(s) (acquired), left foot: Secondary | ICD-10-CM

## 2021-01-25 DIAGNOSIS — M2041 Other hammer toe(s) (acquired), right foot: Secondary | ICD-10-CM | POA: Diagnosis not present

## 2021-01-25 DIAGNOSIS — M79674 Pain in right toe(s): Secondary | ICD-10-CM

## 2021-01-25 DIAGNOSIS — B351 Tinea unguium: Secondary | ICD-10-CM

## 2021-01-25 DIAGNOSIS — M2011 Hallux valgus (acquired), right foot: Secondary | ICD-10-CM

## 2021-01-25 DIAGNOSIS — L84 Corns and callosities: Secondary | ICD-10-CM

## 2021-01-25 DIAGNOSIS — G5791 Unspecified mononeuropathy of right lower limb: Secondary | ICD-10-CM | POA: Insufficient documentation

## 2021-01-25 DIAGNOSIS — E11319 Type 2 diabetes mellitus with unspecified diabetic retinopathy without macular edema: Secondary | ICD-10-CM | POA: Insufficient documentation

## 2021-01-25 DIAGNOSIS — E1165 Type 2 diabetes mellitus with hyperglycemia: Secondary | ICD-10-CM | POA: Insufficient documentation

## 2021-01-25 DIAGNOSIS — E1140 Type 2 diabetes mellitus with diabetic neuropathy, unspecified: Secondary | ICD-10-CM | POA: Insufficient documentation

## 2021-01-25 NOTE — Progress Notes (Signed)
ANNUAL DIABETIC FOOT EXAM  Subjective: TEAGEN Wright presents today for for annual diabetic foot examination, at risk foot care with history of diabetic neuropathy and callus(es) left hallux and painful thick toenails that are difficult to trim. Painful toenails interfere with ambulation. Aggravating factors include wearing enclosed shoe gear. Pain is relieved with periodic professional debridement. Painful calluses are aggravated when weightbearing with and without shoegear. Pain is relieved with periodic professional debridement..  Patient relates 20 year h/o diabetes.  Patient denies any h/o foot wounds.  Patient does have symptoms of foot numbness.  Patient does have symptoms of foot tingling.  Patient does have symptoms of burning in feet.  Patient does have symptoms of pins/needles in feet.  Patient's blood sugar was 136 mg/dl this morning.   Sierra Palmer, MD is patient's PCP. Her endocrinologist is Dr. Dorisann Wright and last visit was 12/28/2020.  Past Medical History:  Diagnosis Date  . Arthritis   . Coronary artery disease 1999   CABG  . Depression   . Diabetes mellitus without complication (HCC)    type II; metformin and Amaryl  . Diabetic neuropathy (HCC)   . History of anemia as a child   . HOH (hard of hearing)   . Hypertension   . Hypothyroidism    synthroid  . PONV (postoperative nausea and vomiting)   . Restless leg syndrome   . S/P cardiac cath 08/13/18 stable CAD 08/14/2018  . Shortness of breath 09/03/12   ECHO- The LA is Moderately Dilated, mild mitral annular calcification. mild pulmonary hypertension, moderate concentric LV hypertrophy. Atrial septum is aneurysmal. Aortic valve appears to be mildly sclerotic.EF 88%  . Urinary incontinence   . Weakness of both legs    Patient Active Problem List   Diagnosis Date Noted  . Diabetic neuropathy (HCC) 01/25/2021  . Diabetic retinopathy (HCC) 01/25/2021  . Hyperglycemia due to type 2 diabetes  mellitus (HCC) 01/25/2021  . Unspecified mononeuropathy of right lower limb 01/25/2021  . S/P cardiac cath 08/13/18 stable CAD 08/14/2018  . Orthopnea 08/12/2018  . Chest pain 09/01/2016  . Diabetic polyneuropathy associated with diabetes mellitus due to underlying condition (HCC) 07/26/2016  . Chronic daily headache 07/26/2016  . RLS (restless legs syndrome) 07/26/2016  . Ulnar neuropathy of right upper extremity 07/26/2016  . Ischemic stroke (HCC) 07/26/2016  . CVA (cerebral infarction): Early subacute 07/14/2016  . Back pain 07/14/2016  . Essential hypertension   . 2nd degree AV block   . Thyroid activity decreased   . Coronary artery disease involving coronary bypass graft of native heart without angina pectoris   . Binocular vision disorder with diplopia 07/13/2016  . Weakness 07/13/2016  . Chronic diastolic CHF (congestive heart failure) (HCC) 04/16/2016  . Osteoporosis 07/27/2015    Class: Chronic  . Spinal stenosis, lumbar region, with neurogenic claudication 07/26/2015    Class: Chronic  . Spondylolisthesis of lumbar region 07/26/2015    Class: Chronic  . Hx of CABG 03/15/2014  . Osteoarthritis 03/15/2014  . DM type 2 goal A1C below 7.5 03/15/2014  . Peripheral edema 03/15/2014  . Benign hypertensive heart disease without heart failure 03/15/2014  . Right bundle branch block 03/15/2014  . First degree AV block 03/15/2014  . Sinus bradycardia by electrocardiogram 03/15/2014  . Dizziness 03/15/2014   Past Surgical History:  Procedure Laterality Date  . ABDOMINAL HYSTERECTOMY    . APPENDECTOMY    . BACK SURGERY    . CARDIAC CATHERIZATION  08/20/03   Widely patent  bypass grafts. Normal left ventricular systolic function.  Marland Kitchen CARDIAC CATHETERIZATION    . CHOLECYSTECTOMY    . COLONOSCOPY    . CORONARY ARTERY BYPASS GRAFT    . EYE SURGERY     bilateral cataracts  . LEFT HEART CATH AND CORONARY ANGIOGRAPHY N/A 08/13/2018   Procedure: LEFT HEART CATH AND CORONARY  ANGIOGRAPHY;  Surgeon: Kathleene Hazel, MD;  Location: [facility];  Service: Cardiovascular;  Laterality: N/A;  . LUMBAR LAMINECTOMY/DECOMPRESSION MICRODISCECTOMY N/A 07/26/2015   Procedure: BILATERAL Lumbar 3-4 SUBTOTAL HEMILAMINECTOMY WITH LATERAL RECESS DECOMPRESSION;  Surgeon: Kerrin Champagne, MD;  Location: [facility] OR;  Service: Orthopedics;  Laterality: N/A;  . SHOULDER SURGERY    . TUBAL LIGATION     Current Outpatient Medications on File Prior to Visit  Medication Sig Dispense Refill  . acetaminophen-codeine (TYLENOL #3) 300-30 MG tablet Take 1 tablet by mouth every 6 (six) hours as needed.    Marland Kitchen amLODipine (NORVASC) 5 MG tablet Take 1 tablet by mouth once daily 90 tablet 3  . Ascorbic Acid (VITAMIN C ER PO) Take 1,000 mg by mouth daily.     . Ascorbic Acid (VITAMIN C) 1000 MG tablet 1 tablet    . aspirin EC 81 MG EC tablet Take 1 tablet (81 mg total) by mouth daily.    . baclofen (LIORESAL) 10 MG tablet Take 0.5 - 1 tablet as needed for severe headache. 30 each 3  . BD PEN NEEDLE NANO 2ND GEN 32G X 4 MM MISC daily. use as directed    . benazepril (LOTENSIN) 40 MG tablet Take 1 tablet by mouth once daily 90 tablet 3  . Calcium Carb-Cholecalciferol 600-800 MG-UNIT TABS Take 1 tablet by mouth 2 (two) times daily.    . calcium carbonate (OS-CAL - DOSED IN MG OF ELEMENTAL CALCIUM) 1250 (500 Ca) MG tablet 1 tablet after meals    . carbidopa-levodopa (SINEMET IR) 25-100 MG tablet Take 1.5 tablets by mouth at bedtime. 135 tablet 3  . Cholecalciferol (VITAMIN D3) 100000 UNIT/GM POWD 1 tablet    . clopidogrel (PLAVIX) 75 MG tablet Take 1 tablet by mouth once daily 90 tablet 2  . Dextrose-Fructose-Sod Citrate (NAUZENE) 4.35-4.17-0.921 GM/15ML LIQD     . diazepam (VALIUM) 10 MG tablet Take 10 mg by mouth every 6 (six) hours as needed for anxiety.    . furosemide (LASIX) 40 MG tablet Take 1 tablet by mouth twice daily 180 tablet 3  . gabapentin (NEURONTIN) 100 MG capsule Take 1 capsule  (100 mg total) by mouth 3 (three) times daily. 270 capsule 3  . Garlic 1000 MG CAPS Take 1,000 mg by mouth daily.     Marland Kitchen glimepiride (AMARYL) 1 MG tablet Take 1 mg by mouth daily with breakfast.    . glucose blood (ONETOUCH VERIO) test strip USE  STRIP TO CHECK GLUCOSE TWICE DAILY AS DIRECTED    . hydrALAZINE (APRESOLINE) 25 MG tablet TAKE 1 TABLET BY MOUTH THREE TIMES DAILY 270 tablet 2  . insulin glargine (LANTUS) 100 UNIT/ML injection Inject 26 Units into the skin daily.     . Insulin Pen Needle (BD PEN NEEDLE NANO 2ND GEN) 32G X 4 MM MISC See admin instructions.    . isosorbide mononitrate (IMDUR) 30 MG 24 hr tablet Take 1 tablet by mouth once daily 90 tablet 3  . Lancets (ONETOUCH DELICA PLUS LANCET33G) MISC USE   TO CHECK GLUCOSE TWICE DAILY AS DIRECTED    . Lancets [facility] DELICA PLUS  LANCET33G) MISC Apply topically 2 (two) times daily.    Marland Kitchen. levofloxacin (LEVAQUIN) 500 MG tablet Take 500 mg by mouth daily.    Marland Kitchen. levothyroxine (SYNTHROID, LEVOTHROID) 100 MCG tablet Take 100 mcg by mouth daily before breakfast.    . metoprolol succinate (TOPROL-XL) 25 MG 24 hr tablet Take 1 tablet (25 mg total) by mouth daily. 90 tablet 3  . mirtazapine (REMERON) 30 MG tablet 1 tablet at bedtime.    . mirtazapine (REMERON) 30 MG tablet Take 30 mg by mouth at bedtime.    . nitroGLYCERIN (NITROSTAT) 0.4 MG SL tablet DISSOLVE ONE TABLET UNDER THE TONGUE EVERY 5 MINUTES AS NEEDED FOR CHEST PAIN. 25 tablet 3  . ondansetron (ZOFRAN) 4 MG tablet Take 4 mg by mouth every 4 (four) hours as needed.    Letta Pate. ONETOUCH VERIO test strip SMARTSIG:Strip(s) Via Meter Twice Daily    . potassium chloride (K-DUR,KLOR-CON) 10 MEQ tablet Take 10-20 mEq by mouth 2 (two) times daily.     . potassium chloride (KLOR-CON 10) 10 MEQ tablet 1 tablet    . sertraline (ZOLOFT) 50 MG tablet Take 50 mg by mouth daily.    . vitamin B-12 (CYANOCOBALAMIN) 1000 MCG tablet Take 1,000 mcg by mouth daily.     No current facility-administered  medications on file prior to visit.    Allergies  Allergen Reactions  . Cymbalta [Duloxetine Hcl] Nausea And Vomiting  . Statins Other (See Comments)    Causes myalgias  . Amitriptyline Other (See Comments)    Pt. States that it caused it to be suicidal.   . Benadryl [Diphenhydramine Hcl (Sleep)] Other (See Comments)    Makes the patient very "hyper"  . Ezetimibe     Other reaction(s): Unknown  . Metformin Hcl     Other reaction(s): Unknown  . Other     Other reaction(s): Unknown  . Sitagliptin     Other reaction(s): Unknown  . Tylenol [Acetaminophen] Nausea Only  . Welchol [Colesevelam Hcl] Other (See Comments)    Causes dizziness   Social History   Occupational History  . Not on file  Tobacco Use  . Smoking status: Never Smoker  . Smokeless tobacco: Never Used  Vaping Use  . Vaping Use: Never used  Substance and Sexual Activity  . Alcohol use: No  . Drug use: No  . Sexual activity: Not on file   Family History  Problem Relation Age of Onset  . Other Mother   . Pneumonia Mother   . Heart attack Father   . Liver cancer Sister   . Stroke Sister   . Heart attack Brother 56  . Stroke Son [id number]  . Healthy Daughter    Immunization History  Administered Date(s) Administered  . Influenza, High Dose Seasonal PF 08/14/2018, 09/30/2019     Review of Systems: Negative except as noted in the HPI.  Objective: There were no vitals filed for this visit.  Sierra Wright is a pleasant 87 y.o. female in NAD. AAO X 3.  Vascular Examination: Capillary fill time to digits <3 seconds b/l lower extremities. Faintly palpable pedal pulses b/l. Pedal hair absent. Lower extremity skin temperature gradient within normal limits. No edema noted b/l lower extremities.  Dermatological Examination: Pedal skin with normal turgor, texture and tone bilaterally. No open wounds bilaterally. No interdigital macerations bilaterally. Toenails 1-5 b/l elongated, discolored, dystrophic, thickened,  crumbly with subungual debris and tenderness to dorsal palpation. Hyperkeratotic lesion(s) L hallux.  No erythema, no edema, no  drainage, no fluctuance.  Musculoskeletal Examination: Normal muscle strength 5/5 to all lower extremity muscle groups bilaterally. No pain crepitus or joint limitation noted with ROM b/l. Hallux valgus with bunion deformity noted b/l lower extremities. Utilizes wheelchair for mobility assistance.  Footwear Assessment: Does the patient wear appropriate shoes? Yes. Does the patient need inserts/orthotics?  Yes.  Neurological Examination: Pt has subjective symptoms of neuropathy. Protective sensation decreased with 10 gram monofilament b/l.  Assessment: 1. Pain due to onychomycosis of toenails of both feet   2. Callus   3. Hallux valgus, acquired, bilateral   4. Acquired hammertoes of both feet   5. Diabetic polyneuropathy associated with diabetes mellitus due to underlying condition (HCC)   6. Encounter for diabetic foot exam (HCC)     ADA Risk Categorization: High Risk  Patient has one or more of the following: Loss of protective sensation Absent pedal pulses Severe Foot deformity History of foot ulcer  Plan: -Examined patient. -Diabetic foot examination performed on today's visit. -Patient to continue soft, supportive shoe gear daily. -Toenails 1-5 b/l were debrided in length and girth with sterile nail nippers and dremel without iatrogenic bleeding.  -Callus(es) L hallux pared utilizing sterile scalpel blade without complication or incident. Total number debrided =1. -Patient to report any pedal injuries to medical professional immediately. -Patient/POA to call should there be question/concern in the interim.  Return in about 3 months (around 04/24/2021).  Freddie Breech, DPM

## 2021-01-31 ENCOUNTER — Encounter: Payer: Medicare Other | Admitting: Neurology

## 2021-02-03 ENCOUNTER — Ambulatory Visit: Payer: Medicare Other | Admitting: Cardiovascular Disease

## 2021-02-14 ENCOUNTER — Other Ambulatory Visit: Payer: Self-pay | Admitting: Cardiovascular Disease

## 2021-03-05 ENCOUNTER — Other Ambulatory Visit: Payer: Self-pay | Admitting: Cardiovascular Disease

## 2021-03-15 ENCOUNTER — Other Ambulatory Visit: Payer: Self-pay

## 2021-03-15 ENCOUNTER — Encounter: Payer: Self-pay | Admitting: Cardiovascular Disease

## 2021-03-15 ENCOUNTER — Ambulatory Visit: Payer: Medicare Other | Admitting: Cardiovascular Disease

## 2021-03-15 VITALS — BP 118/50 | HR 72 | Ht 66.0 in | Wt 211.2 lb

## 2021-03-15 DIAGNOSIS — I5032 Chronic diastolic (congestive) heart failure: Secondary | ICD-10-CM | POA: Diagnosis not present

## 2021-03-15 DIAGNOSIS — I25810 Atherosclerosis of coronary artery bypass graft(s) without angina pectoris: Secondary | ICD-10-CM | POA: Diagnosis not present

## 2021-03-15 NOTE — Patient Instructions (Signed)
Medication Instructions:  Your physician recommends that you continue on your current medications as directed. Please refer to the Current Medication list given to you today.  *If you need a refill on your cardiac medications before your next appointment, please call your pharmacy*   Lab Work: none If you have labs (blood work) drawn today and your tests are completely normal, you will receive your results only by: . MyChart Message (if you have MyChart) OR . A paper copy in the mail If you have any lab test that is abnormal or we need to change your treatment, we will call you to review the results.   Testing/Procedures: none   Follow-Up: At [facility], you and your health needs are our priority.  As part of our continuing mission to provide you with exceptional heart care, we have created designated Provider Care Teams.  These Care Teams include your primary Cardiologist (physician) and Advanced Practice Providers (APPs -  Physician Assistants and Nurse Practitioners) who all work together to provide you with the care you need, when you need it.  We recommend signing up for the patient portal called "MyChart".  Sign up information is provided on this After Visit Summary.  MyChart is used to connect with patients for Virtual Visits (Telemedicine).  Patients are able to view lab/test results, encounter notes, upcoming appointments, etc.  Non-urgent messages can be sent to your provider as well.   To learn more about what you can do with MyChart, go to [email or web address].    Your next appointment:    as needed  The format for your next appointment:   In Person  Provider:   You may see Philip [name], MD or one of the following Advanced Practice Providers on your designated Care Team:    Scott Weaver, PA-C  Vin Bhagat, PA-C    

## 2021-03-15 NOTE — Progress Notes (Signed)
Cardiology Office Note   Date:  03/15/2021   ID:  RAMSIE OSTRANDER, DOB September 06, 1934, MRN [id number]  PCP:  [name], MD  Cardiologist: Cassell Clement MD  --> Mecca Guitron   Chief Complaint  Patient presents with  . Coronary Artery Disease     Problem list 1. Coronary artery disease-status post coronary artery bypass grafting 2. Essential hypertension 3.  Chronic diastolic congestive heart failure  MAIA HANDA is a 87 y.o. female who presents for a six-month office visit This pleasant 87 year old woman is seen for a followup office visit. Her PCP is Dr. [name]. The patient has a history of known ischemic heart disease. She had successful CABG 16 years ago by Dr. [name]. She has not been experiencing any subsequent chest pain or angina. She notes occasional shortness of breath.  Her last echocardiogram was 03/25/14 and showed an ejection fraction of 65-70% with grade 1 diastolic dysfunction.  She has a history of hypertension and she has a past history of mild fluid retention responding to Lasix. She has intermittent ankle edema. She has occasional mild dizzy spells but no syncope. She has not been experiencing any palpitations or tachycardia. She is relatively sedentary because of significant osteoarthritis of her knees. She arrived in our office today by wheelchair. She sleeps with one pillow under her head and also a pillow under her feet to help with her mild dependent edema. During the day she wears compression hose. She does not have any history of TIA or stroke. She does not have any history of peptic ulcer disease or GI bleeding. She has a history of intolerance to cholesterol lowering drugs including statins, ezetimibe, and Welchol. Family history is positive for ischemic heart disease. Her father died at 67 and a brother died at 73 of heart attacks.  At her last visit we decreased her beta blocker because of bradycardia and we increased her hydralazine because of  systolic hypertension. Since then her blood pressure has been satisfactory. The patient has not been having any chest pain.  She has had no recurrence of the paroxysmal atrial flutter fibrillation which occurred in August 2016 immediately postoperative. she does get short of breath with minimal housework activities.  Her peripheral edema has resolved.  EKG today shows normal sinus rhythm.  Apr 16, 2016:  Pt is seen today .   Recent transfer from Dr. [name].  Has hx of CAD, chronic diastolic CHF,  Essential HTN. No cardiac issues. Is in a wheelchair ,   Uses a walker at home  Avoids salt .   10/04/2016:   Tuwanda is seen today for follow-up visit. Is very short of breath today since her hospitalization one month ago. She has been in the hospital twice since I last saw her.   She was admitted in October and was seen by Dr. Antoine Poche  on October 1. She had presented with episodes of chest pain. Troponin levels were negative. He recommended an outpatient Lexiscan  Myoview study.  Has not had the stress myoview yet   Aug. 14, 2018:  Jezebelle is here to follow-up for her  CAD and shortness of breath.  She had a stress Myoview study in November, 2017 which was normal. She has no ischemia.  Left ventricular ejection fraction 60%.  August 11, 2018: Ms. Saez is seen as a work in visit .  She has been having worsening dyspnea.  More shortness of breath recently  Has a dry cough  Has not seen  her primary MD recently Has a headache. Is dizzy.    Has a choking sensation when she lies down.   Had CP on one occasion when she was choking SL NTG helped  This was 6 weeks ago  Has had a chronic headache for weeks .  These symptoms do not feel like her symptoms prior to her CABG .  Had a false positive myoview in 2004.   January 12, 2019:  Maralyn SagoSarah seen back today for follow-up visit.  When I saw her previously in September, 2019 she was having symptoms that were concerning for unstable  angina. She had a mildly elevated troponin level and we sent her over to the hospital to be admitted.  Heart catheterization revealed three-vessel coronary artery disease.  2 of the 3 of her bypass grafts were patent. No targets for PCI.  The patient was treated medically.  Had lots of CP,  Had severe CP   Took 2 SL NTG with eventual relief .    Sept. 3, 2020  Seen today for follow up .  Seen with daugter.   [name] No cp.   Has leg cramps .  Her foot doctor has asked for LE arterial duplex scan and ABIs.   February 03, 2020:  Mizani seen today for follow-up visit.   She was seen with daughter, Sherryl BartersSherr.  She has a history of coronary artery disease with coronary artery bypass grafting.  She has known severe native and graft disease.  She is not a candidate for any further interventions.  She was found to have elevated end-diastolic filling pressure at her last heart catheterization in 2019.  She was seen in the ER Feb. 17, 2021.    Her last echocardiogram performed in August, 2017 reveals normal left ventricular systolic function. Does not walk .  Only stands to transfer from one to chair.  Limited by severe DOE and weakness  Still eats some salty foods.  Frozen dinners.  Takes Lasix 40 BID .  Does not want to take more lasix  Has headaches.   March 15, 2021:   Has leg swelling  Has frequent headaches  No new complaints   Past Medical History:  Diagnosis Date  . Arthritis   . Coronary artery disease 1999   CABG  . Depression   . Diabetes mellitus without complication (HCC)    type II; metformin and Amaryl  . Diabetic neuropathy (HCC)   . History of anemia as a child   . HOH (hard of hearing)   . Hypertension   . Hypothyroidism    synthroid  . PONV (postoperative nausea and vomiting)   . Restless leg syndrome   . S/P cardiac cath 08/13/18 stable CAD 08/14/2018  . Shortness of breath 09/03/12   ECHO- The LA is Moderately Dilated, mild mitral annular calcification. mild  pulmonary hypertension, moderate concentric LV hypertrophy. Atrial septum is aneurysmal. Aortic valve appears to be mildly sclerotic.EF 88%  . Urinary incontinence   . Weakness of both legs     Past Surgical History:  Procedure Laterality Date  . ABDOMINAL HYSTERECTOMY    . APPENDECTOMY    . BACK SURGERY    . CARDIAC CATHERIZATION  08/20/03   Widely patent bypass grafts. Normal left ventricular systolic function.  Marland Kitchen. CARDIAC CATHETERIZATION    . CHOLECYSTECTOMY    . COLONOSCOPY    . CORONARY ARTERY BYPASS GRAFT    . EYE SURGERY     bilateral cataracts  . LEFT HEART CATH  AND CORONARY ANGIOGRAPHY N/A 08/13/2018   Procedure: LEFT HEART CATH AND CORONARY ANGIOGRAPHY;  Surgeon: Kathleene Hazel, MD;  Location: [facility];  Service: Cardiovascular;  Laterality: N/A;  . LUMBAR LAMINECTOMY/DECOMPRESSION MICRODISCECTOMY N/A 07/26/2015   Procedure: BILATERAL Lumbar 3-4 SUBTOTAL HEMILAMINECTOMY WITH LATERAL RECESS DECOMPRESSION;  Surgeon: Kerrin Champagne, MD;  Location: [facility] OR;  Service: Orthopedics;  Laterality: N/A;  . SHOULDER SURGERY    . TUBAL LIGATION       Current Outpatient Medications  Medication Sig Dispense Refill  . acetaminophen-codeine (TYLENOL #3) 300-30 MG tablet Take 1 tablet by mouth every 6 (six) hours as needed.    Marland Kitchen amLODipine (NORVASC) 5 MG tablet Take 1 tablet by mouth once daily 90 tablet 3  . Ascorbic Acid (VITAMIN C ER PO) Take 1,000 mg by mouth daily.     . Ascorbic Acid (VITAMIN C) 1000 MG tablet 1 tablet    . aspirin EC 81 MG EC tablet Take 1 tablet (81 mg total) by mouth daily.    . BD PEN NEEDLE NANO 2ND GEN 32G X 4 MM MISC daily. use as directed    . benazepril (LOTENSIN) 40 MG tablet Take 1 tablet by mouth once daily 90 tablet 1  . Calcium Carb-Cholecalciferol 600-800 MG-UNIT TABS Take 1 tablet by mouth 2 (two) times daily.    . calcium carbonate (OS-CAL - DOSED IN MG OF ELEMENTAL CALCIUM) 1250 (500 Ca) MG tablet 1 tablet after meals    .  carbidopa-levodopa (SINEMET IR) 25-100 MG tablet Take 1.5 tablets by mouth at bedtime. 135 tablet 3  . Cholecalciferol (VITAMIN D3) 100000 UNIT/GM POWD 1 tablet    . clopidogrel (PLAVIX) 75 MG tablet Take 1 tablet by mouth once daily 90 tablet 2  . Dextrose-Fructose-Sod Citrate (NAUZENE) 4.35-4.17-0.921 GM/15ML LIQD     . diazepam (VALIUM) 10 MG tablet Take 10 mg by mouth every 6 (six) hours as needed for anxiety.    . furosemide (LASIX) 40 MG tablet Take 1 tablet by mouth twice daily 180 tablet 3  . gabapentin (NEURONTIN) 100 MG capsule Take 1 capsule (100 mg total) by mouth 3 (three) times daily. 270 capsule 3  . Garlic 1000 MG CAPS Take 1,000 mg by mouth daily.     Marland Kitchen glimepiride (AMARYL) 1 MG tablet Take 1 mg by mouth daily with breakfast.    . hydrALAZINE (APRESOLINE) 25 MG tablet TAKE 1 TABLET BY MOUTH THREE TIMES DAILY 270 tablet 2  . insulin glargine (LANTUS) 100 UNIT/ML injection Inject 26 Units into the skin daily.     . Insulin Pen Needle (BD PEN NEEDLE NANO 2ND GEN) 32G X 4 MM MISC See admin instructions.    . isosorbide mononitrate (IMDUR) 30 MG 24 hr tablet Take 1 tablet by mouth once daily 90 tablet 3  . Lancets (ONETOUCH DELICA PLUS LANCET33G) MISC Apply topically 2 (two) times daily.    Marland Kitchen levofloxacin (LEVAQUIN) 500 MG tablet Take 500 mg by mouth daily.    Marland Kitchen levothyroxine (SYNTHROID, LEVOTHROID) 100 MCG tablet Take 100 mcg by mouth daily before breakfast.    . metoprolol succinate (TOPROL-XL) 25 MG 24 hr tablet Take 1 tablet by mouth once daily 90 tablet 0  . mirtazapine (REMERON) 30 MG tablet Take 30 mg by mouth at bedtime.    . nitroGLYCERIN (NITROSTAT) 0.4 MG SL tablet DISSOLVE ONE TABLET UNDER THE TONGUE EVERY 5 MINUTES AS NEEDED FOR CHEST PAIN. 25 tablet 3  . ondansetron (ZOFRAN) 4 MG  tablet Take 4 mg by mouth every 4 (four) hours as needed.    Letta Pate VERIO test strip SMARTSIG:Strip(s) Via Meter Twice Daily    . potassium chloride (K-DUR,KLOR-CON) 10 MEQ tablet Take  10-20 mEq by mouth 2 (two) times daily.     . vitamin B-12 (CYANOCOBALAMIN) 1000 MCG tablet Take 1,000 mcg by mouth daily.     No current facility-administered medications for this visit.    Allergies:   Cymbalta [duloxetine hcl], Statins, Amitriptyline, Benadryl [diphenhydramine hcl (sleep)], Ezetimibe, Metformin hcl, Other, Sitagliptin, Tylenol [acetaminophen], and Welchol [colesevelam hcl]    Social History:  The patient  reports that she has never smoked. She has never used smokeless tobacco. She reports that she does not drink alcohol and does not use drugs.   Family History:  The patient's family history includes Healthy in her daughter; Heart attack in her father; Heart attack (age of onset: 47) in her brother; Liver cancer in her sister; Other in her mother; Pneumonia in her mother; Stroke in her sister; Stroke (age of onset: 34) in her son.    ROS:  Please see the history of present illness.   Otherwise, review of systems are positive for none.   All other systems are reviewed and negative.     Physical Exam: Blood pressure (!) 118/50, pulse 72, height 5\' 6"  (1.676 m), weight 211 lb 3.2 oz (95.8 kg), SpO2 94 %.  GEN: chronically ill appearing female, examined in the wheelchair HEENT: Normal NECK: No JVD; No carotid bruits LYMPHATICS: No lymphadenopathy CARDIAC: RRR , soft systolic murmur RESPIRATORY:  Clear to auscultation without rales, wheezing or rhonchi  ABDOMEN: Soft, non-tender, non-distended MUSCULOSKELETAL: Trace edema SKIN: Warm and dry NEUROLOGIC:  Alert and oriented x 3    EKG:    Recent Labs: No results found for requested labs within last 8760 hours.    Lipid Panel    Component Value Date/Time   CHOL 149 08/12/2018 0236   CHOL 183 08/11/2018 1254   TRIG 171 (H) 08/12/2018 0236   HDL 34 (L) 08/12/2018 0236   HDL 45 08/11/2018 1254   CHOLHDL 4.4 08/12/2018 0236   VLDL 34 08/12/2018 0236   LDLCALC 81 08/12/2018 0236   LDLCALC 93 08/11/2018 1254       Wt Readings from Last 3 Encounters:  03/15/21 211 lb 3.2 oz (95.8 kg)  12/16/20 210 lb (95.3 kg)  08/03/20 217 lb (98.4 kg)     ASSESSMENT AND PLAN:  1.  CAD:    Has CAD .   Cath in 2019.   Showed severe native coronary artery disease.  She has 2 of 3 patent grafts.  There is severe stenosis in the proximal LAD followed by occlusion of the LAD the in the midsegment.  The LIMA is patent to the distal LAD.  She is basically wheelchair-bound.  Fortunately she is not having any significant angina.  At this point I would not refer her for any additional cardiac procedures.  I will plan on seeing her on an as-needed basis.  I will ask if Dr. 2020 can assume responsibility for her medications and write her prescriptions.   2. ischemic heart disease status post CABG:.    3. chronic diastolic CHF :   Stable 4.  Generalized failure to thrive: Her overall health continues to gradually decline.  She will follow up with Dr. Paulino Rily.     Paulino Rily, MD  03/15/2021 2:23 PM    [facility]  [address],  [address] [place], [place]  [address] Pager [phone] Phone: [phone]; Fax: [phone]

## 2021-04-01 DIAGNOSIS — R059 Cough, unspecified: Secondary | ICD-10-CM | POA: Diagnosis not present

## 2021-04-09 ENCOUNTER — Other Ambulatory Visit: Payer: Self-pay | Admitting: Cardiovascular Disease

## 2021-04-19 ENCOUNTER — Other Ambulatory Visit: Payer: Self-pay | Admitting: Cardiovascular Disease

## 2021-05-10 ENCOUNTER — Other Ambulatory Visit: Payer: Self-pay

## 2021-05-10 ENCOUNTER — Encounter: Payer: Self-pay | Admitting: Podiatry

## 2021-05-10 ENCOUNTER — Ambulatory Visit: Payer: Medicare Other | Admitting: Podiatry

## 2021-05-10 DIAGNOSIS — L84 Corns and callosities: Secondary | ICD-10-CM

## 2021-05-10 DIAGNOSIS — M79674 Pain in right toe(s): Secondary | ICD-10-CM

## 2021-05-10 DIAGNOSIS — L600 Ingrowing nail: Secondary | ICD-10-CM

## 2021-05-10 DIAGNOSIS — M79675 Pain in left toe(s): Secondary | ICD-10-CM | POA: Diagnosis not present

## 2021-05-10 DIAGNOSIS — B351 Tinea unguium: Secondary | ICD-10-CM | POA: Diagnosis not present

## 2021-05-10 DIAGNOSIS — E0842 Diabetes mellitus due to underlying condition with diabetic polyneuropathy: Secondary | ICD-10-CM

## 2021-05-17 NOTE — Progress Notes (Signed)
Subjective:  Patient ID: Sierra Wright, female    DOB: [birth year],  MRN: [id number]  87 y.o. female presents with at risk foot care with history of diabetic neuropathy and callus(es) left hallux and painful thick toenails that are difficult to trim. Painful toenails interfere with ambulation. Aggravating factors include wearing enclosed shoe gear. Pain is relieved with periodic professional debridement. Painful calluses are aggravated when weightbearing with and without shoegear. Pain is relieved with periodic professional debridement..    Patient's daughter is present during today's visit.  Patient states she has a headache today.  PCP: [name], MD.  Review of Systems: Negative except as noted in the HPI.   Allergies  Allergen Reactions   Cymbalta [Duloxetine Hcl] Nausea And Vomiting   Statins Other (See Comments)    Causes myalgias   Amitriptyline Other (See Comments)    Pt. States that it caused it to be suicidal.    Benadryl [Diphenhydramine Hcl (Sleep)] Other (See Comments)    Makes the patient very "hyper"   Ezetimibe     Other reaction(s): Unknown   Metformin Hcl     Other reaction(s): Unknown   Other     Other reaction(s): Unknown   Sitagliptin     Other reaction(s): Unknown   Tylenol [Acetaminophen] Nausea Only   Welchol [Colesevelam Hcl] Other (See Comments)    Causes dizziness    Objective:  There were no vitals filed for this visit. Constitutional Patient is a pleasant 87 y.o. Caucasian female obese in NAD. AAO x 3.  Vascular Capillary fill time to digits <3 seconds b/l lower extremities. Faintly palpable DP pulse(s) b/l lower extremities. Faintly palpable PT pulse(s) b/l lower extremities. Pedal hair absent. Lower extremity skin temperature gradient within normal limits. No pain with calf compression b/l. No edema noted b/l lower extremities. No cyanosis or clubbing noted.  Neurologic Normal speech. Pt has subjective symptoms of neuropathy. Protective  sensation decreased with 10 gram monofilament b/l.  Dermatologic Pedal skin with normal turgor, texture and tone bilaterally. No open wounds bilaterally. No interdigital macerations bilaterally. Toenails 1-5 b/l elongated, discolored, dystrophic, thickened, crumbly with subungual debris and tenderness to dorsal palpation. Incurvated nailplate lateral border(s) R hallux.  Nail border hypertrophy minimal. There is tenderness to palpation. Sign(s) of infection: no clinical signs of infection noted on examination today.. Hyperkeratotic lesion(s) L hallux.  No erythema, no edema, no drainage, no fluctuance.  Orthopedic: Normal muscle strength 5/5 to all lower extremity muscle groups bilaterally. No pain crepitus or joint limitation noted with ROM b/l. Hallux valgus with bunion deformity noted b/l lower extremities.    Assessment:   1. Pain due to onychomycosis of toenails of both feet   2. Callus   3. Ingrown toenail without infection   4. Diabetic polyneuropathy associated with diabetes mellitus due to underlying condition [facility])    Plan:  Patient was evaluated and treated and all questions answered.  Onychomycosis with pain -Nails palliatively debridement as below. -Educated on self-care  Procedure: Nail Debridement Rationale: Pain Type of Debridement: manual, sharp debridement. Instrumentation: Nail nipper, rotary burr. Number of Nails: 10  -Examined patient. -Patient to continue soft, supportive shoe gear daily. -Toenails 1-5 b/l were debrided in length and girth with sterile nail nippers and dremel without iatrogenic bleeding.  -Offending nail border debrided and curretaged R hallux utilizing sterile nail nipper and currette. Border(s) cleansed with alcohol and triple antibiotic ointment applied. Dispensed written instructions for once daily epsom salt soaks for 1 days. -Patient to report  any pedal injuries to medical professional immediately. -Patient/POA to call should there be  question/concern in the interim.  Return in about 3 months (around 08/10/2021) for diabetic nail trim.  Freddie Breech, DPM

## 2021-06-04 ENCOUNTER — Other Ambulatory Visit: Payer: Self-pay | Admitting: Cardiovascular Disease

## 2021-06-14 DIAGNOSIS — I10 Essential (primary) hypertension: Secondary | ICD-10-CM | POA: Diagnosis not present

## 2021-06-14 DIAGNOSIS — R110 Nausea: Secondary | ICD-10-CM | POA: Diagnosis not present

## 2021-06-14 DIAGNOSIS — E1140 Type 2 diabetes mellitus with diabetic neuropathy, unspecified: Secondary | ICD-10-CM | POA: Diagnosis not present

## 2021-06-14 DIAGNOSIS — G5791 Unspecified mononeuropathy of right lower limb: Secondary | ICD-10-CM | POA: Diagnosis not present

## 2021-06-14 DIAGNOSIS — E1165 Type 2 diabetes mellitus with hyperglycemia: Secondary | ICD-10-CM | POA: Diagnosis not present

## 2021-06-14 DIAGNOSIS — E11319 Type 2 diabetes mellitus with unspecified diabetic retinopathy without macular edema: Secondary | ICD-10-CM | POA: Diagnosis not present

## 2021-06-14 DIAGNOSIS — E039 Hypothyroidism, unspecified: Secondary | ICD-10-CM | POA: Diagnosis not present

## 2021-07-05 DIAGNOSIS — E1142 Type 2 diabetes mellitus with diabetic polyneuropathy: Secondary | ICD-10-CM | POA: Diagnosis not present

## 2021-07-05 DIAGNOSIS — E559 Vitamin D deficiency, unspecified: Secondary | ICD-10-CM | POA: Diagnosis not present

## 2021-07-05 DIAGNOSIS — E039 Hypothyroidism, unspecified: Secondary | ICD-10-CM | POA: Diagnosis not present

## 2021-07-05 DIAGNOSIS — E785 Hyperlipidemia, unspecified: Secondary | ICD-10-CM | POA: Diagnosis not present

## 2021-07-05 DIAGNOSIS — Z7984 Long term (current) use of oral hypoglycemic drugs: Secondary | ICD-10-CM | POA: Diagnosis not present

## 2021-07-05 DIAGNOSIS — Z79899 Other long term (current) drug therapy: Secondary | ICD-10-CM | POA: Diagnosis not present

## 2021-07-09 ENCOUNTER — Other Ambulatory Visit: Payer: Self-pay | Admitting: Cardiovascular Disease

## 2021-07-09 DIAGNOSIS — I10 Essential (primary) hypertension: Secondary | ICD-10-CM

## 2021-07-09 DIAGNOSIS — R252 Cramp and spasm: Secondary | ICD-10-CM

## 2021-07-09 DIAGNOSIS — I25810 Atherosclerosis of coronary artery bypass graft(s) without angina pectoris: Secondary | ICD-10-CM

## 2021-07-09 DIAGNOSIS — I5032 Chronic diastolic (congestive) heart failure: Secondary | ICD-10-CM

## 2021-07-12 DIAGNOSIS — Z0000 Encounter for general adult medical examination without abnormal findings: Secondary | ICD-10-CM | POA: Diagnosis not present

## 2021-07-12 DIAGNOSIS — E039 Hypothyroidism, unspecified: Secondary | ICD-10-CM | POA: Diagnosis not present

## 2021-07-12 DIAGNOSIS — E559 Vitamin D deficiency, unspecified: Secondary | ICD-10-CM | POA: Diagnosis not present

## 2021-07-12 DIAGNOSIS — E1142 Type 2 diabetes mellitus with diabetic polyneuropathy: Secondary | ICD-10-CM | POA: Diagnosis not present

## 2021-07-12 DIAGNOSIS — G2581 Restless legs syndrome: Secondary | ICD-10-CM | POA: Diagnosis not present

## 2021-07-12 DIAGNOSIS — G259 Extrapyramidal and movement disorder, unspecified: Secondary | ICD-10-CM | POA: Diagnosis not present

## 2021-07-12 DIAGNOSIS — N1831 Chronic kidney disease, stage 3a: Secondary | ICD-10-CM | POA: Diagnosis not present

## 2021-07-12 DIAGNOSIS — E785 Hyperlipidemia, unspecified: Secondary | ICD-10-CM | POA: Diagnosis not present

## 2021-07-12 DIAGNOSIS — D692 Other nonthrombocytopenic purpura: Secondary | ICD-10-CM | POA: Diagnosis not present

## 2021-07-23 ENCOUNTER — Other Ambulatory Visit: Payer: Self-pay | Admitting: Cardiovascular Disease

## 2021-07-23 ENCOUNTER — Other Ambulatory Visit: Payer: Self-pay | Admitting: Physician Assistant

## 2021-07-23 DIAGNOSIS — I5032 Chronic diastolic (congestive) heart failure: Secondary | ICD-10-CM

## 2021-07-23 DIAGNOSIS — I25810 Atherosclerosis of coronary artery bypass graft(s) without angina pectoris: Secondary | ICD-10-CM

## 2021-07-23 DIAGNOSIS — I10 Essential (primary) hypertension: Secondary | ICD-10-CM

## 2021-07-23 DIAGNOSIS — R252 Cramp and spasm: Secondary | ICD-10-CM

## 2021-08-16 ENCOUNTER — Encounter: Payer: Self-pay | Admitting: Podiatry

## 2021-08-16 ENCOUNTER — Other Ambulatory Visit: Payer: Self-pay

## 2021-08-16 ENCOUNTER — Ambulatory Visit: Payer: Medicare Other | Admitting: Podiatry

## 2021-08-16 DIAGNOSIS — E0842 Diabetes mellitus due to underlying condition with diabetic polyneuropathy: Secondary | ICD-10-CM

## 2021-08-16 DIAGNOSIS — M79675 Pain in left toe(s): Secondary | ICD-10-CM | POA: Diagnosis not present

## 2021-08-16 DIAGNOSIS — M79674 Pain in right toe(s): Secondary | ICD-10-CM | POA: Diagnosis not present

## 2021-08-16 DIAGNOSIS — B351 Tinea unguium: Secondary | ICD-10-CM | POA: Diagnosis not present

## 2021-08-16 DIAGNOSIS — L84 Corns and callosities: Secondary | ICD-10-CM

## 2021-08-22 NOTE — Progress Notes (Signed)
Subjective:  Patient ID: Sierra Wright, female    DOB: 1934-06-02,  MRN: [id number]  87 y.o. female presents with at risk foot care with history of diabetic neuropathy and callus(es) left hallux and painful thick toenails that are difficult to trim. Painful toenails interfere with ambulation. Aggravating factors include wearing enclosed shoe gear. Pain is relieved with periodic professional debridement. Painful calluses are aggravated when weightbearing with and without shoegear. Pain is relieved with periodic professional debridement.  Patient's daughter is present during today's visit.  Patient states she has a headache today.  PCP: [name], MD. Last office visit was 2-3 weeks ago.  Review of Systems: Negative except as noted in the HPI.   Allergies  Allergen Reactions   Duloxetine Hcl Nausea And Vomiting and Other (See Comments)   Statins Other (See Comments)    Causes myalgias Other reaction(s): Unknown   Amitriptyline Other (See Comments)    Pt. States that it caused it to be suicidal.    Benadryl [Diphenhydramine Hcl (Sleep)] Other (See Comments)    Makes the patient very "hyper"   Ezetimibe     Other reaction(s): Unknown Other reaction(s): Unknown   Metformin Hcl     Other reaction(s): Unknown Other reaction(s): Unknown   Other     Other reaction(s): Unknown   Sitagliptin     Other reaction(s): Unknown Other reaction(s): Unknown   Tylenol [Acetaminophen] Nausea Only   Colesevelam Hcl Other (See Comments)    Causes dizziness Other reaction(s): Unknown    Objective:  There were no vitals filed for this visit. Constitutional Patient is a pleasant 87 y.o. Caucasian female obese in NAD. AAO x 3.  Vascular Capillary fill time to digits <3 seconds b/l lower extremities. Faintly palpable DP pulse(s) b/l lower extremities. Faintly palpable PT pulse(s) b/l lower extremities. Pedal hair absent. Lower extremity skin temperature gradient within normal limits. No pain  with calf compression b/l. No edema noted b/l lower extremities. No cyanosis or clubbing noted.  Neurologic Normal speech. Pt has subjective symptoms of neuropathy. Protective sensation decreased with 10 gram monofilament b/l.  Dermatologic Pedal skin with normal turgor, texture and tone bilaterally. No open wounds bilaterally. No interdigital macerations bilaterally. Toenails 1-5 b/l elongated, discolored, dystrophic, thickened, crumbly with subungual debris and tenderness to dorsal palpation. Incurvated nailplate lateral border(s) R hallux.  Nail border hypertrophy minimal. There is tenderness to palpation. Sign(s) of infection: no clinical signs of infection noted on examination today. Hyperkeratotic lesion(s) L hallux.  No erythema, no edema, no drainage, no fluctuance.  Orthopedic: Normal muscle strength 5/5 to all lower extremity muscle groups bilaterally. No pain crepitus or joint limitation noted with ROM b/l. Hallux valgus with bunion deformity noted b/l lower extremities.    Assessment:   1. Pain due to onychomycosis of toenails of both feet   2. Callus   3. Diabetic polyneuropathy associated with diabetes mellitus due to underlying condition (HCC)    Plan:  -Examined patient. -Continue diabetic foot care principles: inspect feet daily, monitor glucose as recommended by PCP and/or Endocrinologist, and follow prescribed diet per PCP, Endocrinologist and/or dietician. -Patient to continue soft, supportive shoe gear daily. -Toenails 1-5 b/l were debrided in length and girth with sterile nail nippers and dremel without iatrogenic bleeding.  -Callus(es) L hallux pared utilizing sterile scalpel blade without complication or incident. Total number debrided =1. -Patient to report any pedal injuries to medical professional immediately. -Patient/POA to call should there be question/concern in the interim.  Return in about 3  months (around 11/15/2021).  Marzetta Board, DPM

## 2021-08-30 DIAGNOSIS — M2550 Pain in unspecified joint: Secondary | ICD-10-CM | POA: Diagnosis not present

## 2021-08-30 DIAGNOSIS — E11319 Type 2 diabetes mellitus with unspecified diabetic retinopathy without macular edema: Secondary | ICD-10-CM | POA: Diagnosis not present

## 2021-08-30 DIAGNOSIS — E1165 Type 2 diabetes mellitus with hyperglycemia: Secondary | ICD-10-CM | POA: Diagnosis not present

## 2021-08-30 DIAGNOSIS — E039 Hypothyroidism, unspecified: Secondary | ICD-10-CM | POA: Diagnosis not present

## 2021-08-30 DIAGNOSIS — E1140 Type 2 diabetes mellitus with diabetic neuropathy, unspecified: Secondary | ICD-10-CM | POA: Diagnosis not present

## 2021-08-30 DIAGNOSIS — M791 Myalgia, unspecified site: Secondary | ICD-10-CM | POA: Diagnosis not present

## 2021-08-30 DIAGNOSIS — I10 Essential (primary) hypertension: Secondary | ICD-10-CM | POA: Diagnosis not present

## 2021-08-30 DIAGNOSIS — Z23 Encounter for immunization: Secondary | ICD-10-CM | POA: Diagnosis not present

## 2021-08-30 DIAGNOSIS — G5791 Unspecified mononeuropathy of right lower limb: Secondary | ICD-10-CM | POA: Diagnosis not present

## 2021-09-07 ENCOUNTER — Ambulatory Visit: Payer: Medicare Other | Admitting: Orthopaedic Surgery

## 2021-09-07 ENCOUNTER — Other Ambulatory Visit: Payer: Self-pay

## 2021-09-07 ENCOUNTER — Ambulatory Visit: Payer: Self-pay

## 2021-09-07 ENCOUNTER — Encounter: Payer: Self-pay | Admitting: Orthopaedic Surgery

## 2021-09-07 DIAGNOSIS — M1711 Unilateral primary osteoarthritis, right knee: Secondary | ICD-10-CM | POA: Diagnosis not present

## 2021-09-07 DIAGNOSIS — M170 Bilateral primary osteoarthritis of knee: Secondary | ICD-10-CM

## 2021-09-07 DIAGNOSIS — M1712 Unilateral primary osteoarthritis, left knee: Secondary | ICD-10-CM | POA: Diagnosis not present

## 2021-09-07 MED ORDER — LIDOCAINE HCL 1 % IJ SOLN
3.0000 mL | INTRAMUSCULAR | Status: AC | PRN
  Administered 2021-09-07: 3 mL

## 2021-09-07 MED ORDER — METHYLPREDNISOLONE ACETATE 40 MG/ML IJ SUSP
13.3300 mg | INTRAMUSCULAR | Status: AC | PRN
  Administered 2021-09-07: 13.33 mg via INTRA_ARTICULAR

## 2021-09-07 MED ORDER — BUPIVACAINE HCL 0.25 % IJ SOLN
0.6600 mL | INTRAMUSCULAR | Status: AC | PRN
Start: 2021-09-07 — End: 2021-09-07
  Administered 2021-09-07: .66 mL via INTRA_ARTICULAR

## 2021-09-07 MED ORDER — BUPIVACAINE HCL 0.25 % IJ SOLN
0.6600 mL | INTRAMUSCULAR | Status: AC | PRN
  Administered 2021-09-07: .66 mL via INTRA_ARTICULAR

## 2021-09-07 NOTE — Progress Notes (Signed)
Office Visit Note   Patient: Sierra Wright           Date of Birth: 12-Apr-1934           MRN: [id number] Visit Date: 09/07/2021              Requested by: [name], MD [address] [place],  [place] [address] PCP: [name], MD   Assessment & Plan: Visit Diagnoses:  1. Bilateral primary osteoarthritis of knee     Plan: Impression is advanced degenerative joint disease bilateral knees.  Today, we discussed various treatment options to include repeat cortisone injection, viscosupplementation injection as well as surgical intervention to include total knee arthroplasty.  The patient does not appear to be a surgical candidate so we would like to repeat cortisone injection today.  Have also provided her with viscosupplementation handout if she fails to get relief from cortisone.  She will call and let us know.  Follow-up with [place] as needed.  Follow-Up Instructions: Return if symptoms worsen or fail to improve.   Orders:  Orders Placed This Encounter  Procedures   Large Joint Inj: bilateral knee   XR KNEE 3 VIEW RIGHT   XR KNEE 3 VIEW LEFT   No orders of the defined types were placed in this encounter.     Procedures: Large Joint Inj: bilateral knee on 09/07/2021 10:41 AM Indications: pain Details: 22 G needle, anterolateral approach Medications (Right): 0.66 mL bupivacaine 0.25 %; 3 mL lidocaine 1 %; 13.33 mg methylPREDNISolone acetate 40 MG/ML Medications (Left): 0.66 mL bupivacaine 0.25 %; 3 mL lidocaine 1 %; 13.33 mg methylPREDNISolone acetate 40 MG/ML     Clinical Data: No additional findings.   Subjective: Chief Complaint  Patient presents with   Right Knee - Pain   Left Knee - Pain    HPI patient is a pleasant 87 year old female who comes in today with bilateral knee pain left greater than right.  This is been ongoing for several years.  She was seen by Dr. Otelia Sergeant several years ago where cortisone injection was performed.  She  continues to have pain to the entire aspect of the right knee and the lateral aspect of the left knee.  She describes this as a constant ache with associated instability.  Pain is worse with walking, although she is primarily wheelchair-bound.  She takes over-the-counter pain medication without significant relief.  Review of Systems as detailed in HPI.  All others reviewed and are negative.   Objective: Vital Signs: There were no vitals taken for this visit.  Physical Exam well-developed well-nourished female no acute distress.  Alert and oriented x3.  Ortho Exam bilateral knee exam shows range of motion from 0 to 95 degrees.  Medial and lateral joint line tenderness both sides.  Moderate patellofemoral crepitus.  She is neurovascular intact distally.  Specialty Comments:  No specialty comments available.  Imaging: No results found.   PMFS History: Patient Active Problem List   Diagnosis Date Noted   Diabetic neuropathy (HCC) 01/25/2021   Diabetic retinopathy (HCC) 01/25/2021   Hyperglycemia due to type 2 diabetes mellitus (HCC) 01/25/2021   Unspecified mononeuropathy of right lower limb 01/25/2021   S/P cardiac cath 08/13/18 stable CAD 08/14/2018   Orthopnea 08/12/2018   Chest pain 09/01/2016   Diabetic polyneuropathy associated with diabetes mellitus due to underlying condition (HCC) 07/26/2016   Chronic daily headache 07/26/2016   RLS (restless legs syndrome) 07/26/2016   Ulnar neuropathy of right upper  extremity 07/26/2016   Ischemic stroke (HCC) 07/26/2016   CVA (cerebral infarction): Early subacute 07/14/2016   Back pain 07/14/2016   Essential hypertension    2nd degree AV block    Thyroid activity decreased    Coronary artery disease involving coronary bypass graft of native heart without angina pectoris    Binocular vision disorder with diplopia 07/13/2016   Weakness 07/13/2016   Chronic diastolic CHF (congestive heart failure) (HCC) 04/16/2016   Osteoporosis  07/27/2015    Class: Chronic   Spinal stenosis, lumbar region, with neurogenic claudication 07/26/2015    Class: Chronic   Spondylolisthesis of lumbar region 07/26/2015    Class: Chronic   Hx of CABG 03/15/2014   Osteoarthritis 03/15/2014   DM type 2 goal A1C below 7.5 03/15/2014   Peripheral edema 03/15/2014   Benign hypertensive heart disease without heart failure 03/15/2014   Right bundle branch block 03/15/2014   First degree AV block 03/15/2014   Sinus bradycardia by electrocardiogram 03/15/2014   Dizziness 03/15/2014   Past Medical History:  Diagnosis Date   Arthritis    Coronary artery disease 1999   CABG   Depression    Diabetes mellitus without complication (HCC)    type II; metformin and Amaryl   Diabetic neuropathy (HCC)    History of anemia as a child    HOH (hard of hearing)    Hypertension    Hypothyroidism    synthroid   PONV (postoperative nausea and vomiting)    Restless leg syndrome    S/P cardiac cath 08/13/18 stable CAD 08/14/2018   Shortness of breath 09/03/12   ECHO- The LA is Moderately Dilated, mild mitral annular calcification. mild pulmonary hypertension, moderate concentric LV hypertrophy. Atrial septum is aneurysmal. Aortic valve appears to be mildly sclerotic.EF 88%   Urinary incontinence    Weakness of both legs     Family History  Problem Relation Age of Onset   Other Mother    Pneumonia Mother    Heart attack Father    Liver cancer Sister    Stroke Sister    Heart attack Brother 30   Stroke Son 21   Healthy Daughter     Past Surgical History:  Procedure Laterality Date   ABDOMINAL HYSTERECTOMY     APPENDECTOMY     BACK SURGERY     CARDIAC CATHERIZATION  08/20/03   Widely patent bypass grafts. Normal left ventricular systolic function.   CARDIAC CATHETERIZATION     CHOLECYSTECTOMY     COLONOSCOPY     CORONARY ARTERY BYPASS GRAFT     EYE SURGERY     bilateral cataracts   LEFT HEART CATH AND CORONARY ANGIOGRAPHY N/A 08/13/2018    Procedure: LEFT HEART CATH AND CORONARY ANGIOGRAPHY;  Surgeon: Kathleene Hazel, MD;  Location: [facility];  Service: Cardiovascular;  Laterality: N/A;   LUMBAR LAMINECTOMY/DECOMPRESSION MICRODISCECTOMY N/A 07/26/2015   Procedure: BILATERAL Lumbar 3-4 SUBTOTAL HEMILAMINECTOMY WITH LATERAL RECESS DECOMPRESSION;  Surgeon: Kerrin Champagne, MD;  Location: [facility] OR;  Service: Orthopedics;  Laterality: N/A;   SHOULDER SURGERY     TUBAL LIGATION     Social History   Occupational History   Not on file  Tobacco Use   Smoking status: Never   Smokeless tobacco: Never  Vaping Use   Vaping Use: Never used  Substance and Sexual Activity   Alcohol use: No   Drug use: No   Sexual activity: Not on file

## 2021-09-20 ENCOUNTER — Other Ambulatory Visit: Payer: Self-pay

## 2021-09-20 ENCOUNTER — Emergency Department (HOSPITAL_BASED_OUTPATIENT_CLINIC_OR_DEPARTMENT_OTHER)
Admission: EM | Admit: 2021-09-20 | Discharge: 2021-09-20 | Disposition: A | Discharge status: Home / Self Care | Payer: Medicare Other | Attending: Emergency Medicine | Admitting: Emergency Medicine

## 2021-09-20 ENCOUNTER — Encounter (HOSPITAL_BASED_OUTPATIENT_CLINIC_OR_DEPARTMENT_OTHER): Payer: Self-pay

## 2021-09-20 ENCOUNTER — Emergency Department (HOSPITAL_BASED_OUTPATIENT_CLINIC_OR_DEPARTMENT_OTHER): Payer: Medicare Other | Admitting: Radiology

## 2021-09-20 DIAGNOSIS — M1612 Unilateral primary osteoarthritis, left hip: Secondary | ICD-10-CM | POA: Diagnosis not present

## 2021-09-20 DIAGNOSIS — I2510 Atherosclerotic heart disease of native coronary artery without angina pectoris: Secondary | ICD-10-CM | POA: Insufficient documentation

## 2021-09-20 DIAGNOSIS — M25552 Pain in left hip: Secondary | ICD-10-CM | POA: Insufficient documentation

## 2021-09-20 DIAGNOSIS — Z7982 Long term (current) use of aspirin: Secondary | ICD-10-CM | POA: Diagnosis not present

## 2021-09-20 DIAGNOSIS — Z7902 Long term (current) use of antithrombotics/antiplatelets: Secondary | ICD-10-CM | POA: Diagnosis not present

## 2021-09-20 DIAGNOSIS — E039 Hypothyroidism, unspecified: Secondary | ICD-10-CM | POA: Diagnosis not present

## 2021-09-20 DIAGNOSIS — E1142 Type 2 diabetes mellitus with diabetic polyneuropathy: Secondary | ICD-10-CM | POA: Insufficient documentation

## 2021-09-20 DIAGNOSIS — G8929 Other chronic pain: Secondary | ICD-10-CM | POA: Diagnosis not present

## 2021-09-20 DIAGNOSIS — Z79899 Other long term (current) drug therapy: Secondary | ICD-10-CM | POA: Insufficient documentation

## 2021-09-20 DIAGNOSIS — Z951 Presence of aortocoronary bypass graft: Secondary | ICD-10-CM | POA: Insufficient documentation

## 2021-09-20 DIAGNOSIS — I110 Hypertensive heart disease with heart failure: Secondary | ICD-10-CM | POA: Diagnosis not present

## 2021-09-20 DIAGNOSIS — M25559 Pain in unspecified hip: Secondary | ICD-10-CM

## 2021-09-20 DIAGNOSIS — Z794 Long term (current) use of insulin: Secondary | ICD-10-CM | POA: Diagnosis not present

## 2021-09-20 DIAGNOSIS — Z7984 Long term (current) use of oral hypoglycemic drugs: Secondary | ICD-10-CM | POA: Insufficient documentation

## 2021-09-20 DIAGNOSIS — I5032 Chronic diastolic (congestive) heart failure: Secondary | ICD-10-CM | POA: Diagnosis not present

## 2021-09-20 MED ORDER — OXYCODONE HCL 5 MG PO TABS
5.0000 mg | ORAL_TABLET | Freq: Once | ORAL | Status: AC
  Administered 2021-09-20: 5 mg via ORAL
  Filled 2021-09-20: qty 1

## 2021-09-20 MED ORDER — KETOROLAC TROMETHAMINE 60 MG/2ML IM SOLN
30.0000 mg | Freq: Once | INTRAMUSCULAR | Status: AC
  Administered 2021-09-20: 30 mg via INTRAMUSCULAR
  Filled 2021-09-20: qty 2

## 2021-09-20 MED ORDER — LIDOCAINE 5 % EX PTCH
1.0000 | MEDICATED_PATCH | CUTANEOUS | Status: DC
  Administered 2021-09-20: 1 via TRANSDERMAL
  Filled 2021-09-20: qty 1

## 2021-09-20 NOTE — ED Triage Notes (Signed)
States sat down on w/c on Sunday and c/o left hip 10/10. Denies injury or fall.  Able to stand and pivot.

## 2021-09-20 NOTE — ED Provider Notes (Signed)
[facility] EMERGENCY DEPT Provider Note   CSN: [id number] Arrival date & time: 09/20/21  1043     History Chief Complaint  Patient presents with   Hip Pain    Sierra Wright is a 87 y.o. female.   Hip Pain Pertinent negatives include no chest pain, no abdominal pain, no headaches and no shortness of breath. Patient presents for acute on chronic left hip pain.  She has a history of osteoarthritis.  She does see an orthopedic doctor for management of knee arthritis.  She does not walk at baseline.  She is able to transfer from bed to wheelchair at home.  3 days ago, she was standing and sat down on a chair forcefully.  Since that time, she has had worsened pain in the area of her left hip.  Location of greatest pain is the posterior region of her hip.  Pain is worsened with standing/weightbearing.  She has been taking ibuprofen for analgesia.  She denies any other areas of pain.  She has not had fevers or chills.  She presents to the ED with her daughter.     Past Medical History:  Diagnosis Date   Arthritis    Coronary artery disease 1999   CABG   Depression    Diabetes mellitus without complication (HCC)    type II; metformin and Amaryl   Diabetic neuropathy (HCC)    History of anemia as a child    HOH (hard of hearing)    Hypertension    Hypothyroidism    synthroid   PONV (postoperative nausea and vomiting)    Restless leg syndrome    S/P cardiac cath 08/13/18 stable CAD 08/14/2018   Shortness of breath 09/03/12   ECHO- The LA is Moderately Dilated, mild mitral annular calcification. mild pulmonary hypertension, moderate concentric LV hypertrophy. Atrial septum is aneurysmal. Aortic valve appears to be mildly sclerotic.EF 88%   Urinary incontinence    Weakness of both legs     Patient Active Problem List   Diagnosis Date Noted   Diabetic neuropathy (HCC) 01/25/2021   Diabetic retinopathy (HCC) 01/25/2021   Hyperglycemia due to type 2 diabetes mellitus  (HCC) 01/25/2021   Unspecified mononeuropathy of right lower limb 01/25/2021   S/P cardiac cath 08/13/18 stable CAD 08/14/2018   Orthopnea 08/12/2018   Chest pain 09/01/2016   Diabetic polyneuropathy associated with diabetes mellitus due to underlying condition (HCC) 07/26/2016   Chronic daily headache 07/26/2016   RLS (restless legs syndrome) 07/26/2016   Ulnar neuropathy of right upper extremity 07/26/2016   Ischemic stroke (HCC) 07/26/2016   CVA (cerebral infarction): Early subacute 07/14/2016   Back pain 07/14/2016   Essential hypertension    2nd degree AV block    Thyroid activity decreased    Coronary artery disease involving coronary bypass graft of native heart without angina pectoris    Binocular vision disorder with diplopia 07/13/2016   Weakness 07/13/2016   Chronic diastolic CHF (congestive heart failure) (HCC) 04/16/2016   Osteoporosis 07/27/2015    Class: Chronic   Spinal stenosis, lumbar region, with neurogenic claudication 07/26/2015    Class: Chronic   Spondylolisthesis of lumbar region 07/26/2015    Class: Chronic   Hx of CABG 03/15/2014   Osteoarthritis 03/15/2014   DM type 2 goal A1C below 7.5 03/15/2014   Peripheral edema 03/15/2014   Benign hypertensive heart disease without heart failure 03/15/2014   Right bundle branch block 03/15/2014   First degree AV block 03/15/2014   Sinus  bradycardia by electrocardiogram 03/15/2014   Dizziness 03/15/2014    Past Surgical History:  Procedure Laterality Date   ABDOMINAL HYSTERECTOMY     APPENDECTOMY     BACK SURGERY     CARDIAC CATHERIZATION  08/20/03   Widely patent bypass grafts. Normal left ventricular systolic function.   CARDIAC CATHETERIZATION     CHOLECYSTECTOMY     COLONOSCOPY     CORONARY ARTERY BYPASS GRAFT     EYE SURGERY     bilateral cataracts   LEFT HEART CATH AND CORONARY ANGIOGRAPHY N/A 08/13/2018   Procedure: LEFT HEART CATH AND CORONARY ANGIOGRAPHY;  Surgeon: Kathleene Hazel, MD;   Location: [facility];  Service: Cardiovascular;  Laterality: N/A;   LUMBAR LAMINECTOMY/DECOMPRESSION MICRODISCECTOMY N/A 07/26/2015   Procedure: BILATERAL Lumbar 3-4 SUBTOTAL HEMILAMINECTOMY WITH LATERAL RECESS DECOMPRESSION;  Surgeon: Kerrin Champagne, MD;  Location: [facility] OR;  Service: Orthopedics;  Laterality: N/A;   SHOULDER SURGERY     TUBAL LIGATION       OB History   No obstetric history on file.     Family History  Problem Relation Age of Onset   Other Mother    Pneumonia Mother    Heart attack Father    Liver cancer Sister    Stroke Sister    Heart attack Brother 73   Stroke Son 1   Healthy Daughter     Social History   Tobacco Use   Smoking status: Never    Passive exposure: Never   Smokeless tobacco: Never  Vaping Use   Vaping Use: Never used  Substance Use Topics   Alcohol use: No   Drug use: No    Home Medications Prior to Admission medications   Medication Sig Start Date End Date Taking? Authorizing Provider  acetaminophen-codeine (TYLENOL #3) 300-30 MG tablet Take 1 tablet by mouth every 6 (six) hours as needed. 05/11/20   [provider]  amLODipine (NORVASC) 5 MG tablet Take 1 tablet by mouth once daily 04/11/21   [name], Deloris Ping, MD  Ascorbic Acid (VITAMIN C ER PO) Take 1,000 mg by mouth daily.     [provider]  Ascorbic Acid (VITAMIN C) 1000 MG tablet 1 tablet    [provider]  aspirin 81 MG chewable tablet 1 tablet    [provider]  aspirin EC 81 MG EC tablet Take 1 tablet (81 mg total) by mouth daily. 08/15/18   Leone Brand, NP  BD PEN NEEDLE NANO 2ND GEN 32G X 4 MM MISC daily. use as directed 01/17/21   [provider]  benazepril (LOTENSIN) 40 MG tablet Take 1 tablet by mouth once daily 07/24/21   [name], Deloris Ping, MD  benzonatate (TESSALON) 200 MG capsule Take 200 mg by mouth 3 (three) times daily. 04/01/21   [provider]  Calcium Carb-Cholecalciferol 600-800 MG-UNIT TABS Take 1  tablet by mouth 2 (two) times daily.    [provider]  calcium carbonate (OS-CAL - DOSED IN MG OF ELEMENTAL CALCIUM) 1250 (500 Ca) MG tablet 1 tablet after meals    [provider]  carbidopa-levodopa (SINEMET IR) 25-100 MG tablet Take 1.5 tablets by mouth at bedtime. 12/16/20   Nita Sickle K, DO  Cholecalciferol (VITAMIN D3) 100000 UNIT/GM POWD 1 tablet    [provider]  clopidogrel (PLAVIX) 75 MG tablet Take 1 tablet by mouth once daily 07/24/21   [name], Deloris Ping, MD  Dextrose-Fructose-Sod Citrate (NAUZENE) 4.35-4.17-0.921 GM/15ML LIQD  [provider]  diazepam (VALIUM) 10 MG tablet Take 10 mg by mouth every 6 (six) hours as needed for anxiety.    [provider]  doxycycline (VIBRAMYCIN) 100 MG capsule Take 100 mg by mouth 2 (two) times daily. 04/01/21   [provider]  furosemide (LASIX) 40 MG tablet Take 1 tablet by mouth twice daily 07/24/21   Bhagat, [place], PA  gabapentin (NEURONTIN) 100 MG capsule Take 1 capsule (100 mg total) by mouth 3 (three) times daily. 12/16/20   Patel, Roxana Hires K, DO  Garlic 1000 MG CAPS Take 1,000 mg by mouth daily.     [provider]  glimepiride (AMARYL) 1 MG tablet Take 1 mg by mouth daily with breakfast.    [provider]  hydrALAZINE (APRESOLINE) 25 MG tablet TAKE 1 TABLET BY MOUTH THREE TIMES DAILY 11/22/20   [name], Deloris Ping, MD  Insulin Pen Needle (BD PEN NEEDLE NANO 2ND GEN) 32G X 4 MM MISC See admin instructions.    [provider]  isosorbide mononitrate (IMDUR) 30 MG 24 hr tablet Take 1 tablet by mouth once daily 07/10/21   [name], Deloris Ping, MD  Lancets [facility] DELICA PLUS [place]) MISC Apply topically 2 (two) times daily. 01/17/21   [provider]  LANTUS SOLOSTAR 100 UNIT/ML Solostar Pen Inject into the skin. 07/10/21   [provider]  levofloxacin (LEVAQUIN) 500 MG tablet Take 500 mg by mouth daily. 08/20/20   [provider]   levothyroxine (SYNTHROID, LEVOTHROID) 100 MCG tablet Take 100 mcg by mouth daily before breakfast.    [provider]  metoprolol succinate (TOPROL-XL) 25 MG 24 hr tablet Take 1 tablet by mouth once daily 06/07/21   [name], Deloris Ping, MD  METOPROLOL SUCCINATE PO 1 capsule    [provider]  mirtazapine (REMERON) 30 MG tablet Take 30 mg by mouth at bedtime. 11/09/20   [provider]  nitroGLYCERIN (NITROSTAT) 0.4 MG SL tablet DISSOLVE ONE TABLET UNDER THE TONGUE EVERY 5 MINUTES AS NEEDED FOR CHEST PAIN. 04/19/21   [name], Deloris Ping, MD  omeprazole (PRILOSEC) 40 MG capsule 1 capsule 30 minutes before morning meal 06/14/21   [provider]  ondansetron (ZOFRAN) 4 MG tablet Take 4 mg by mouth every 4 (four) hours as needed. 08/20/20   [provider]  Lum Babe test strip SMARTSIG:Strip(s) Via Meter Twice Daily 01/17/21   [provider]  potassium chloride (K-DUR,KLOR-CON) 10 MEQ tablet Take 10-20 mEq by mouth 2 (two) times daily.     [provider]  vitamin B-12 (CYANOCOBALAMIN) 1000 MCG tablet Take 1,000 mcg by mouth daily.    [provider]    Allergies    Duloxetine hcl, Statins, Amitriptyline, Benadryl [diphenhydramine hcl (sleep)], Ezetimibe, Metformin hcl, Other, Sitagliptin, Tylenol [acetaminophen], and Colesevelam hcl  Review of Systems   Review of Systems  Constitutional:  Negative for activity change, chills, fatigue and fever.  HENT:  Negative for ear pain and sore throat.   Eyes:  Negative for pain and visual disturbance.  Respiratory:  Negative for cough and shortness of breath.   Cardiovascular:  Negative for chest pain and palpitations.  Gastrointestinal:  Negative for abdominal pain, nausea and vomiting.  Genitourinary:  Negative for dysuria and hematuria.  Musculoskeletal:  Positive for arthralgias and gait problem (Chronic). Negative for back pain, neck pain and neck stiffness.  Skin:  Negative for  color change, rash and wound.  Neurological:  Negative for dizziness, seizures, syncope, weakness, numbness  and headaches.  All other systems reviewed and are negative.  Physical Exam Updated Vital Signs BP (!) 183/53   Pulse (!) 55   Temp (!) 97.5 F (36.4 C) (Oral)   Resp 18   Ht 5\' 6"  (1.676 m)   Wt 95.3 kg   SpO2 95%   BMI 33.89 kg/m   Physical Exam Vitals and nursing note reviewed.  Constitutional:      General: She is not in acute distress.    Appearance: Normal appearance. She is well-developed. She is not ill-appearing, toxic-appearing or diaphoretic.  HENT:     Head: Normocephalic and atraumatic.     Right Ear: External ear normal. There is no impacted cerumen.     Left Ear: External ear normal.     Nose: Nose normal.  Eyes:     Conjunctiva/sclera: Conjunctivae normal.  Cardiovascular:     Rate and Rhythm: Normal rate and regular rhythm.     Heart sounds: No murmur heard. Pulmonary:     Effort: Pulmonary effort is normal. No respiratory distress.     Breath sounds: Normal breath sounds.  Abdominal:     Palpations: Abdomen is soft.     Tenderness: There is no abdominal tenderness.  Musculoskeletal:        General: Tenderness present. No swelling or deformity.     Cervical back: Normal range of motion and neck supple.  Skin:    General: Skin is warm and dry.     Coloration: Skin is not jaundiced or pale.  Neurological:     Mental Status: She is alert.     Cranial Nerves: No cranial nerve deficit.     Sensory: No sensory deficit.     Motor: No weakness.  Psychiatric:        Mood and Affect: Mood normal.        Behavior: Behavior normal.    ED Results / Procedures / Treatments   Labs (all labs ordered are listed, but only abnormal results are displayed) Labs Reviewed - No data to display  EKG None  Radiology DG Hip Unilat W or Wo Pelvis 2-3 Views Left  Result Date: 09/20/2021 CLINICAL DATA:  Hip pain EXAM: DG HIP (WITH OR WITHOUT PELVIS) 2-3V  LEFT COMPARISON:  None. FINDINGS: No dislocation. No definite acute fracture. Advanced osteoarthritis at the left hip with loss of joint space superiorly. Vascular calcifications are noted. Osseous mineralization is decreased. IMPRESSION: Advanced left hip osteoarthritis. No definite acute fracture. If there is persistent concern for fracture, CT could be obtained. Electronically Signed   By: Guadlupe Spanish M.D.   On: 09/20/2021 13:16    Procedures Procedures   Medications Ordered in ED Medications  ketorolac (TORADOL) injection 30 mg (30 mg Intramuscular Given 09/20/21 1442)  oxyCODONE (Oxy IR/ROXICODONE) immediate release tablet 5 mg (5 mg Oral Given 09/20/21 1440)    ED Course  I have reviewed the triage vital signs and the nursing notes.  Pertinent labs & imaging results that were available during my care of the patient were reviewed by me and considered in my medical decision making (see chart for details).    MDM Rules/Calculators/A&P                          Patient presents with acute on chronic left hip pain.  She feels that this pain may have been worsened when she sat forcefully down 3 days ago onto a chair.  Prior to being  bedded in the ED, x-ray imaging was obtained.  X-ray imaging showed advanced left hip osteoarthritis with loss of joint space superiorly.  No fracture was identified on x-ray.  Patient was offered CT imaging of her left hip to identify any occult fractures.  She declined this.  She stated that she simply wanted some relief of her pain.  She states that she has not taken anything for pain thus far today.  Toradol and Roxicodone were given.  Following this, patient reported improved symptoms.  She did request to be discharged.  She was advised to follow-up with her orthopedic doctor for further evaluation and management.  She was discharged in stable condition.  Final Clinical Impression(s) / ED Diagnoses Final diagnoses:  Hip pain    Rx / DC Orders ED  Discharge Orders     None        Gloris Manchester, MD 09/21/21 1024

## 2021-09-20 NOTE — Discharge Instructions (Signed)
Take Tylenol as needed for pain. Follow-up with your primary care doctor as needed.

## 2021-09-27 ENCOUNTER — Ambulatory Visit: Payer: Self-pay

## 2021-09-27 ENCOUNTER — Ambulatory Visit (INDEPENDENT_AMBULATORY_CARE_PROVIDER_SITE_OTHER): Payer: Medicare Other | Admitting: Physical Medicine and Rehabilitation

## 2021-09-27 ENCOUNTER — Other Ambulatory Visit: Payer: Self-pay

## 2021-09-27 ENCOUNTER — Ambulatory Visit: Payer: Medicare Other | Admitting: Orthopaedic Surgery

## 2021-09-27 DIAGNOSIS — M25552 Pain in left hip: Secondary | ICD-10-CM | POA: Diagnosis not present

## 2021-09-27 DIAGNOSIS — M1612 Unilateral primary osteoarthritis, left hip: Secondary | ICD-10-CM

## 2021-09-27 MED ORDER — TRIAMCINOLONE ACETONIDE 40 MG/ML IJ SUSP
60.0000 mg | INTRAMUSCULAR | Status: AC | PRN
Start: 2021-09-27 — End: 2021-09-27
  Administered 2021-09-27: 60 mg via INTRA_ARTICULAR

## 2021-09-27 MED ORDER — HYDROCODONE-ACETAMINOPHEN 5-325 MG PO TABS: 1.0000 | ORAL_TABLET | Freq: Four times a day (QID) | ORAL | 0 refills | Status: DC | PRN

## 2021-09-27 MED ORDER — BUPIVACAINE HCL 0.25 % IJ SOLN
4.0000 mL | INTRAMUSCULAR | Status: AC | PRN
Start: 2021-09-27 — End: 2021-09-27
  Administered 2021-09-27: 4 mL via INTRA_ARTICULAR

## 2021-09-27 NOTE — Progress Notes (Signed)
Left hip pain 

## 2021-09-27 NOTE — Progress Notes (Signed)
Office Visit Note   Patient: Sierra Wright           Date of Birth: 04-Aug-1934           MRN: [id number] Visit Date: 09/27/2021              Requested by: [name], MD [address] [place],  [place] [address] PCP: [name], MD   Assessment & Plan: Visit Diagnoses:  1. Primary osteoarthritis of left hip     Plan: Impression is severe left hip DJD but unlikely a surgical candidate given age and comorbidities.  We will try to manage the pain with medications and we sent her upstairs for a fluoro guided injection with Dr. Alvester Morin.  Norco prescribed as well.  Follow up prn.   Follow-Up Instructions: No follow-ups on file.   Orders:  No orders of the defined types were placed in this encounter.  Meds ordered this encounter  Medications   HYDROcodone-acetaminophen (NORCO) 5-325 MG tablet    Sig: Take 1 tablet by mouth every 6 (six) hours as needed.    Dispense:  20 tablet    Refill:  0      Procedures: No procedures performed   Clinical Data: No additional findings.   Subjective: Chief Complaint  Patient presents with   Left Hip - Pain    Went to ED on  09/20/21, xrays obtained    [name] is a 87 year old female follow up from the ED for severe left hip and groin pain.  This started 2 weeks ago without injury.  Tylenol and ibuprofen provide no relief.  Currently in wheelchair for ambulation and daughter is present.     Review of Systems  Constitutional: Negative.   HENT: Negative.    Eyes: Negative.   Respiratory: Negative.    Cardiovascular: Negative.   Endocrine: Negative.   Musculoskeletal: Negative.   Neurological: Negative.   Hematological: Negative.   Psychiatric/Behavioral: Negative.    All other systems reviewed and are negative.   Objective: Vital Signs: There were no vitals taken for this visit.  Physical Exam Vitals and nursing note reviewed.  Constitutional:      Appearance: She is well-developed.   Pulmonary:     Effort: Pulmonary effort is normal.  Skin:    General: Skin is warm.     Capillary Refill: Capillary refill takes less than 2 seconds.  Neurological:     Mental Status: She is alert and oriented to person, place, and time.  Psychiatric:        Behavior: Behavior normal.        Thought Content: Thought content normal.        Judgment: Judgment normal.    Ortho Exam  Left hip exam is very limited to guarding and pain.  Severe pain with any attempted ROM.    Specialty Comments:  No specialty comments available.  Imaging: XR C-ARM NO REPORT  Result Date: 09/27/2021 Please see Notes tab for imaging impression.    PMFS History: Patient Active Problem List   Diagnosis Date Noted   Diabetic neuropathy (HCC) 01/25/2021   Diabetic retinopathy (HCC) 01/25/2021   Hyperglycemia due to type 2 diabetes mellitus (HCC) 01/25/2021   Unspecified mononeuropathy of right lower limb 01/25/2021   S/P cardiac cath 08/13/18 stable CAD 08/14/2018   Orthopnea 08/12/2018   Chest pain 09/01/2016   Diabetic polyneuropathy associated with diabetes mellitus due to underlying condition (HCC) 07/26/2016   Chronic daily  headache 07/26/2016   RLS (restless legs syndrome) 07/26/2016   Ulnar neuropathy of right upper extremity 07/26/2016   Ischemic stroke (HCC) 07/26/2016   CVA (cerebral infarction): Early subacute 07/14/2016   Back pain 07/14/2016   Essential hypertension    2nd degree AV block    Thyroid activity decreased    Coronary artery disease involving coronary bypass graft of native heart without angina pectoris    Binocular vision disorder with diplopia 07/13/2016   Weakness 07/13/2016   Chronic diastolic CHF (congestive heart failure) (HCC) 04/16/2016   Osteoporosis 07/27/2015    Class: Chronic   Spinal stenosis, lumbar region, with neurogenic claudication 07/26/2015    Class: Chronic   Spondylolisthesis of lumbar region 07/26/2015    Class: Chronic   Hx of CABG  03/15/2014   Osteoarthritis 03/15/2014   DM type 2 goal A1C below 7.5 03/15/2014   Peripheral edema 03/15/2014   Benign hypertensive heart disease without heart failure 03/15/2014   Right bundle branch block 03/15/2014   First degree AV block 03/15/2014   Sinus bradycardia by electrocardiogram 03/15/2014   Dizziness 03/15/2014   Past Medical History:  Diagnosis Date   Arthritis    Coronary artery disease 1999   CABG   Depression    Diabetes mellitus without complication (HCC)    type II; metformin and Amaryl   Diabetic neuropathy (HCC)    History of anemia as a child    HOH (hard of hearing)    Hypertension    Hypothyroidism    synthroid   PONV (postoperative nausea and vomiting)    Restless leg syndrome    S/P cardiac cath 08/13/18 stable CAD 08/14/2018   Shortness of breath 09/03/12   ECHO- The LA is Moderately Dilated, mild mitral annular calcification. mild pulmonary hypertension, moderate concentric LV hypertrophy. Atrial septum is aneurysmal. Aortic valve appears to be mildly sclerotic.EF 88%   Urinary incontinence    Weakness of both legs     Family History  Problem Relation Age of Onset   Other Mother    Pneumonia Mother    Heart attack Father    Liver cancer Sister    Stroke Sister    Heart attack Brother 33   Stroke Son 74   Healthy Daughter     Past Surgical History:  Procedure Laterality Date   ABDOMINAL HYSTERECTOMY     APPENDECTOMY     BACK SURGERY     CARDIAC CATHERIZATION  08/20/03   Widely patent bypass grafts. Normal left ventricular systolic function.   CARDIAC CATHETERIZATION     CHOLECYSTECTOMY     COLONOSCOPY     CORONARY ARTERY BYPASS GRAFT     EYE SURGERY     bilateral cataracts   LEFT HEART CATH AND CORONARY ANGIOGRAPHY N/A 08/13/2018   Procedure: LEFT HEART CATH AND CORONARY ANGIOGRAPHY;  Surgeon: Kathleene Hazel, MD;  Location: [facility];  Service: Cardiovascular;  Laterality: N/A;   LUMBAR LAMINECTOMY/DECOMPRESSION  MICRODISCECTOMY N/A 07/26/2015   Procedure: BILATERAL Lumbar 3-4 SUBTOTAL HEMILAMINECTOMY WITH LATERAL RECESS DECOMPRESSION;  Surgeon: Kerrin Champagne, MD;  Location: [facility] OR;  Service: Orthopedics;  Laterality: N/A;   SHOULDER SURGERY     TUBAL LIGATION     Social History   Occupational History   Not on file  Tobacco Use   Smoking status: Never    Passive exposure: Never   Smokeless tobacco: Never  Vaping Use   Vaping Use: Never used  Substance and Sexual Activity  Alcohol use: No   Drug use: No   Sexual activity: Not on file

## 2021-09-27 NOTE — Progress Notes (Signed)
   Sierra Wright - 87 y.o. female MRN [id number]  Date of birth: 29-Apr-1934  Office Visit Note: Visit Date: 09/27/2021 PCP: [name], MD Referred by: [name], MD  Subjective: Chief Complaint  Patient presents with   Left Hip - Pain   HPI:  Sierra Wright is a 87 y.o. female who comes in today at the request of Dr. Glee Arvin for planned Left anesthetic hip arthrogram with fluoroscopic guidance.  The patient has failed conservative care including home exercise, medications, time and activity modification.  This injection will be diagnostic and hopefully therapeutic.  Please see requesting physician notes for further details and justification.   ROS Otherwise per HPI.  Assessment & Plan: Visit Diagnoses:    ICD-10-CM   1. Pain in left hip  M25.552 XR C-ARM NO REPORT      Plan: No additional findings.   Meds & Orders: No orders of the defined types were placed in this encounter.   Orders Placed This Encounter  Procedures   Large Joint Inj   XR C-ARM NO REPORT    Follow-up: Return for visit to requesting physician as needed.   Procedures: Large Joint Inj: L hip joint on 09/27/2021 3:03 PM Indications: diagnostic evaluation and pain Details: 22 G 3.5 in needle, fluoroscopy-guided anterior approach  Arthrogram: No  Medications: 4 mL bupivacaine 0.25 %; 60 mg triamcinolone acetonide 40 MG/ML Outcome: tolerated well, no immediate complications  There was excellent flow of contrast producing a partial arthrogram of the hip. The patient did NOT have much relief of symptoms during the anesthetic phase of the injection. Procedure, treatment alternatives, risks and benefits explained, specific risks discussed. Consent was given by the patient. Immediately prior to procedure a time out was called to verify the correct patient, procedure, equipment, support staff and site/side marked as required. Patient was prepped and draped in the usual sterile fashion.          Clinical History: No specialty comments available.     Objective:  VS:  HT:    WT:   BMI:     BP:   HR: bpm  TEMP: ( )  RESP:  Physical Exam Musculoskeletal:     Comments: Painful ROM on the left.     Imaging: No results found.

## 2021-09-28 ENCOUNTER — Telehealth: Payer: Self-pay | Admitting: Orthopaedic Surgery

## 2021-09-28 ENCOUNTER — Telehealth: Payer: Self-pay

## 2021-09-28 NOTE — Telephone Encounter (Signed)
Pts daughter Charlton Amor called stating the pt got an injection on 09/27/21 for hip pain but it hasn't kicked in yet. The pt would like to know if there's anything she can do or try to get some relief? She would like [place] to CB on her daughters phone.   [phone]

## 2021-09-28 NOTE — Telephone Encounter (Signed)
Pt called stating that she is in extreme pain. She was crying on the phone stating that she is dying she is in so much pain begging [place] to please do something for her.  Please advise

## 2021-09-28 NOTE — Telephone Encounter (Signed)
Please advise 

## 2021-09-28 NOTE — Telephone Encounter (Signed)
I sent norco when I saw her yesterday

## 2021-09-28 NOTE — Telephone Encounter (Signed)
If she's doing that badly, then she needs to go to the ED

## 2021-09-29 ENCOUNTER — Encounter (HOSPITAL_BASED_OUTPATIENT_CLINIC_OR_DEPARTMENT_OTHER): Payer: Self-pay | Admitting: Emergency Medicine

## 2021-09-29 ENCOUNTER — Emergency Department (HOSPITAL_BASED_OUTPATIENT_CLINIC_OR_DEPARTMENT_OTHER)
Admission: EM | Admit: 2021-09-29 | Discharge: 2021-09-29 | Disposition: A | Discharge status: Home / Self Care | Payer: Medicare Other | Attending: Emergency Medicine | Admitting: Emergency Medicine

## 2021-09-29 ENCOUNTER — Other Ambulatory Visit: Payer: Self-pay

## 2021-09-29 DIAGNOSIS — I2510 Atherosclerotic heart disease of native coronary artery without angina pectoris: Secondary | ICD-10-CM | POA: Insufficient documentation

## 2021-09-29 DIAGNOSIS — Z79899 Other long term (current) drug therapy: Secondary | ICD-10-CM | POA: Insufficient documentation

## 2021-09-29 DIAGNOSIS — Z7982 Long term (current) use of aspirin: Secondary | ICD-10-CM | POA: Diagnosis not present

## 2021-09-29 DIAGNOSIS — M545 Low back pain, unspecified: Secondary | ICD-10-CM | POA: Diagnosis not present

## 2021-09-29 DIAGNOSIS — Z7984 Long term (current) use of oral hypoglycemic drugs: Secondary | ICD-10-CM | POA: Insufficient documentation

## 2021-09-29 DIAGNOSIS — Z743 Need for continuous supervision: Secondary | ICD-10-CM | POA: Diagnosis not present

## 2021-09-29 DIAGNOSIS — G8929 Other chronic pain: Secondary | ICD-10-CM | POA: Diagnosis not present

## 2021-09-29 DIAGNOSIS — Z7902 Long term (current) use of antithrombotics/antiplatelets: Secondary | ICD-10-CM | POA: Diagnosis not present

## 2021-09-29 DIAGNOSIS — E039 Hypothyroidism, unspecified: Secondary | ICD-10-CM | POA: Diagnosis not present

## 2021-09-29 DIAGNOSIS — E1140 Type 2 diabetes mellitus with diabetic neuropathy, unspecified: Secondary | ICD-10-CM | POA: Insufficient documentation

## 2021-09-29 DIAGNOSIS — M25552 Pain in left hip: Secondary | ICD-10-CM | POA: Diagnosis not present

## 2021-09-29 DIAGNOSIS — I5032 Chronic diastolic (congestive) heart failure: Secondary | ICD-10-CM | POA: Insufficient documentation

## 2021-09-29 DIAGNOSIS — Z951 Presence of aortocoronary bypass graft: Secondary | ICD-10-CM | POA: Diagnosis not present

## 2021-09-29 DIAGNOSIS — I110 Hypertensive heart disease with heart failure: Secondary | ICD-10-CM | POA: Insufficient documentation

## 2021-09-29 DIAGNOSIS — R6889 Other general symptoms and signs: Secondary | ICD-10-CM | POA: Diagnosis not present

## 2021-09-29 DIAGNOSIS — Z794 Long term (current) use of insulin: Secondary | ICD-10-CM | POA: Insufficient documentation

## 2021-09-29 DIAGNOSIS — M549 Dorsalgia, unspecified: Secondary | ICD-10-CM | POA: Diagnosis not present

## 2021-09-29 MED ORDER — OXYCODONE HCL 5 MG PO TABS
5.0000 mg | ORAL_TABLET | Freq: Once | ORAL | Status: AC
  Administered 2021-09-29: 5 mg via ORAL
  Filled 2021-09-29: qty 1

## 2021-09-29 MED ORDER — KETOROLAC TROMETHAMINE 60 MG/2ML IM SOLN
30.0000 mg | Freq: Once | INTRAMUSCULAR | Status: AC
  Administered 2021-09-29: 30 mg via INTRAMUSCULAR
  Filled 2021-09-29: qty 2

## 2021-09-29 MED ORDER — OXYCODONE HCL 5 MG PO TABS: 5.0000 mg | ORAL_TABLET | Freq: Four times a day (QID) | ORAL | 0 refills | Status: DC | PRN

## 2021-09-29 NOTE — Telephone Encounter (Signed)
See xu message

## 2021-09-29 NOTE — Telephone Encounter (Signed)
Called patients daughter. Incorrect number states that she is not Sherrie.

## 2021-09-29 NOTE — Discharge Instructions (Signed)
Prescription put in for the oxycodone which seems to help you better.  Make an appointment to follow back up with orthopedics.  If you want to take your hydrocodone you can take 2 every 6 hours.  I would recommend taking the oxycodone every 6 hours.  Would not recommend taking the hydrocodone and the oxycodone together.

## 2021-09-29 NOTE — Telephone Encounter (Signed)
Called patient no answer LMOM. Just need to advise on message. Also see other msg.

## 2021-09-29 NOTE — ED Provider Notes (Signed)
[facility] EMERGENCY DEPT Provider Note   CSN: [id number] Arrival date & time: 09/29/21  0815     History Chief Complaint  Patient presents with   Hip Pain    Sierra Wright is a 87 y.o. female.  Patient without any fall or injury.  Patient with complaint of severe left hip pain.  Patient seen here in the emergency department on October 19 received oxycodone and Toradol IM with some relief.  Patient seen by orthopedics on October 26 by Dr. Roda Shutters, patient was given an injection of steroid and numbing medicine.  Patient given a prescription for hydrocodone.  Which does not expire to the 31st.  Patient states none of this medicine is helping.  She is in severe pain.      Past Medical History:  Diagnosis Date   Arthritis    Coronary artery disease 1999   CABG   Depression    Diabetes mellitus without complication (HCC)    type II; metformin and Amaryl   Diabetic neuropathy (HCC)    History of anemia as a child    HOH (hard of hearing)    Hypertension    Hypothyroidism    synthroid   PONV (postoperative nausea and vomiting)    Restless leg syndrome    S/P cardiac cath 08/13/18 stable CAD 08/14/2018   Shortness of breath 09/03/12   ECHO- The LA is Moderately Dilated, mild mitral annular calcification. mild pulmonary hypertension, moderate concentric LV hypertrophy. Atrial septum is aneurysmal. Aortic valve appears to be mildly sclerotic.EF 88%   Urinary incontinence    Weakness of both legs     Patient Active Problem List   Diagnosis Date Noted   Diabetic neuropathy (HCC) 01/25/2021   Diabetic retinopathy (HCC) 01/25/2021   Hyperglycemia due to type 2 diabetes mellitus (HCC) 01/25/2021   Unspecified mononeuropathy of right lower limb 01/25/2021   S/P cardiac cath 08/13/18 stable CAD 08/14/2018   Orthopnea 08/12/2018   Chest pain 09/01/2016   Diabetic polyneuropathy associated with diabetes mellitus due to underlying condition (HCC) 07/26/2016   Chronic  daily headache 07/26/2016   RLS (restless legs syndrome) 07/26/2016   Ulnar neuropathy of right upper extremity 07/26/2016   Ischemic stroke (HCC) 07/26/2016   CVA (cerebral infarction): Early subacute 07/14/2016   Back pain 07/14/2016   Essential hypertension    2nd degree AV block    Thyroid activity decreased    Coronary artery disease involving coronary bypass graft of native heart without angina pectoris    Binocular vision disorder with diplopia 07/13/2016   Weakness 07/13/2016   Chronic diastolic CHF (congestive heart failure) (HCC) 04/16/2016   Osteoporosis 07/27/2015    Class: Chronic   Spinal stenosis, lumbar region, with neurogenic claudication 07/26/2015    Class: Chronic   Spondylolisthesis of lumbar region 07/26/2015    Class: Chronic   Hx of CABG 03/15/2014   Osteoarthritis 03/15/2014   DM type 2 goal A1C below 7.5 03/15/2014   Peripheral edema 03/15/2014   Benign hypertensive heart disease without heart failure 03/15/2014   Right bundle branch block 03/15/2014   First degree AV block 03/15/2014   Sinus bradycardia by electrocardiogram 03/15/2014   Dizziness 03/15/2014    Past Surgical History:  Procedure Laterality Date   ABDOMINAL HYSTERECTOMY     APPENDECTOMY     BACK SURGERY     CARDIAC CATHERIZATION  08/20/03   Widely patent bypass grafts. Normal left ventricular systolic function.   CARDIAC CATHETERIZATION     CHOLECYSTECTOMY  COLONOSCOPY     CORONARY ARTERY BYPASS GRAFT     EYE SURGERY     bilateral cataracts   LEFT HEART CATH AND CORONARY ANGIOGRAPHY N/A 08/13/2018   Procedure: LEFT HEART CATH AND CORONARY ANGIOGRAPHY;  Surgeon: Kathleene Hazel, MD;  Location: [facility];  Service: Cardiovascular;  Laterality: N/A;   LUMBAR LAMINECTOMY/DECOMPRESSION MICRODISCECTOMY N/A 07/26/2015   Procedure: BILATERAL Lumbar 3-4 SUBTOTAL HEMILAMINECTOMY WITH LATERAL RECESS DECOMPRESSION;  Surgeon: Kerrin Champagne, MD;  Location: [facility] OR;  Service:  Orthopedics;  Laterality: N/A;   SHOULDER SURGERY     TUBAL LIGATION       OB History   No obstetric history on file.     Family History  Problem Relation Age of Onset   Other Mother    Pneumonia Mother    Heart attack Father    Liver cancer Sister    Stroke Sister    Heart attack Brother 67   Stroke Son 81   Healthy Daughter     Social History   Tobacco Use   Smoking status: Never    Passive exposure: Never   Smokeless tobacco: Never  Vaping Use   Vaping Use: Never used  Substance Use Topics   Alcohol use: No   Drug use: No    Home Medications Prior to Admission medications   Medication Sig Start Date End Date Taking? Authorizing Provider  acetaminophen-codeine (TYLENOL #3) 300-30 MG tablet Take 1 tablet by mouth every 6 (six) hours as needed. 05/11/20   [provider]  amLODipine (NORVASC) 5 MG tablet Take 1 tablet by mouth once daily 04/11/21   [name], Deloris Ping, MD  Ascorbic Acid (VITAMIN C ER PO) Take 1,000 mg by mouth daily.     [provider]  Ascorbic Acid (VITAMIN C) 1000 MG tablet 1 tablet    [provider]  aspirin 81 MG chewable tablet 1 tablet    [provider]  aspirin EC 81 MG EC tablet Take 1 tablet (81 mg total) by mouth daily. 08/15/18   Leone Brand, NP  BD PEN NEEDLE NANO 2ND GEN 32G X 4 MM MISC daily. use as directed 01/17/21   [provider]  benazepril (LOTENSIN) 40 MG tablet Take 1 tablet by mouth once daily 07/24/21   [name], Deloris Ping, MD  benzonatate (TESSALON) 200 MG capsule Take 200 mg by mouth 3 (three) times daily. 04/01/21   [provider]  Calcium Carb-Cholecalciferol 600-800 MG-UNIT TABS Take 1 tablet by mouth 2 (two) times daily.    [provider]  calcium carbonate (OS-CAL - DOSED IN MG OF ELEMENTAL CALCIUM) 1250 (500 Ca) MG tablet 1 tablet after meals    [provider]  carbidopa-levodopa (SINEMET IR) 25-100 MG tablet Take 1.5 tablets by mouth at bedtime.  12/16/20   Nita Sickle K, DO  Cholecalciferol (VITAMIN D3) 100000 UNIT/GM POWD 1 tablet    [provider]  clopidogrel (PLAVIX) 75 MG tablet Take 1 tablet by mouth once daily 07/24/21   [name], Deloris Ping, MD  Dextrose-Fructose-Sod Citrate Loletha Carrow) 4.35-4.17-0.921 GM/15ML LIQD     [provider]  diazepam (VALIUM) 10 MG tablet Take 10 mg by mouth every 6 (six) hours as needed for anxiety.    [provider]  doxycycline (VIBRAMYCIN) 100 MG capsule Take 100 mg by mouth 2 (two) times daily. 04/01/21   [provider]  furosemide (LASIX) 40 MG tablet Take 1 tablet by mouth twice daily 07/24/21  Bhagat, Bhavinkumar, PA  gabapentin (NEURONTIN) 100 MG capsule Take 1 capsule (100 mg total) by mouth 3 (three) times daily. 12/16/20   Patel, Roxana Hires K, DO  Garlic 1000 MG CAPS Take 1,000 mg by mouth daily.     [provider]  glimepiride (AMARYL) 1 MG tablet Take 1 mg by mouth daily with breakfast.    [provider]  hydrALAZINE (APRESOLINE) 25 MG tablet TAKE 1 TABLET BY MOUTH THREE TIMES DAILY 11/22/20   [name], Deloris Ping, MD  HYDROcodone-acetaminophen (NORCO) 5-325 MG tablet Take 1 tablet by mouth every 6 (six) hours as needed. 09/27/21   Tarry Kos, MD  Insulin Pen Needle (BD PEN NEEDLE NANO 2ND GEN) 32G X 4 MM MISC See admin instructions.    [provider]  isosorbide mononitrate (IMDUR) 30 MG 24 hr tablet Take 1 tablet by mouth once daily 07/10/21   [name], Deloris Ping, MD  Lancets [facility] DELICA PLUS [place]) MISC Apply topically 2 (two) times daily. 01/17/21   [provider]  LANTUS SOLOSTAR 100 UNIT/ML Solostar Pen Inject into the skin. 07/10/21   [provider]  levofloxacin (LEVAQUIN) 500 MG tablet Take 500 mg by mouth daily. 08/20/20   [provider]  levothyroxine (SYNTHROID, LEVOTHROID) 100 MCG tablet Take 100 mcg by mouth daily before breakfast.    [provider]  metoprolol succinate (TOPROL-XL)  25 MG 24 hr tablet Take 1 tablet by mouth once daily 06/07/21   [name], Deloris Ping, MD  METOPROLOL SUCCINATE PO 1 capsule    [provider]  mirtazapine (REMERON) 30 MG tablet Take 30 mg by mouth at bedtime. 11/09/20   [provider]  nitroGLYCERIN (NITROSTAT) 0.4 MG SL tablet DISSOLVE ONE TABLET UNDER THE TONGUE EVERY 5 MINUTES AS NEEDED FOR CHEST PAIN. 04/19/21   [name], Deloris Ping, MD  omeprazole (PRILOSEC) 40 MG capsule 1 capsule 30 minutes before morning meal 06/14/21   [provider]  ondansetron (ZOFRAN) 4 MG tablet Take 4 mg by mouth every 4 (four) hours as needed. 08/20/20   [provider]  Lum Babe test strip SMARTSIG:Strip(s) Via Meter Twice Daily 01/17/21   [provider]  potassium chloride (K-DUR,KLOR-CON) 10 MEQ tablet Take 10-20 mEq by mouth 2 (two) times daily.     [provider]  vitamin B-12 (CYANOCOBALAMIN) 1000 MCG tablet Take 1,000 mcg by mouth daily.    [provider]    Allergies    Duloxetine hcl, Statins, Amitriptyline, Benadryl [diphenhydramine hcl (sleep)], Ezetimibe, Metformin hcl, Other, Sitagliptin, Tylenol [acetaminophen], and Colesevelam hcl  Review of Systems   Review of Systems  Constitutional:  Negative for chills and fever.  HENT:  Negative for ear pain and sore throat.   Eyes:  Negative for pain and visual disturbance.  Respiratory:  Negative for cough and shortness of breath.   Cardiovascular:  Negative for chest pain and palpitations.  Gastrointestinal:  Negative for abdominal pain and vomiting.  Genitourinary:  Negative for dysuria and hematuria.  Musculoskeletal:  Negative for arthralgias and back pain.  Skin:  Negative for color change and rash.  Neurological:  Negative for seizures and syncope.  All other systems reviewed and are negative.  Physical Exam Updated Vital Signs BP (!) 159/43 (BP Location: Left Arm)   Pulse 62   Temp 98 F (36.7 C) (Oral)   Resp 20   Ht 1.676 m  (5\' 6" )   Wt 95.3 kg   SpO2 98%   BMI 33.89 kg/m  Physical Exam Vitals and nursing note reviewed.  Constitutional:      General: She is not in acute distress.    Appearance: She is well-developed.  HENT:     Head: Normocephalic and atraumatic.  Eyes:     Conjunctiva/sclera: Conjunctivae normal.  Cardiovascular:     Rate and Rhythm: Normal rate and regular rhythm.     Heart sounds: No murmur heard. Pulmonary:     Effort: Pulmonary effort is normal. No respiratory distress.     Breath sounds: Normal breath sounds.  Abdominal:     Palpations: Abdomen is soft.     Tenderness: There is no abdominal tenderness.  Musculoskeletal:        General: No swelling.     Cervical back: Neck supple.     Comments: Pain with movement of the left hip.  Neurovascularly intact distally.  Patient able move her toes.  No numbness to the top or bottom of the foot.  Cap refill is less than 2 seconds.  Skin:    General: Skin is warm and dry.     Capillary Refill: Capillary refill takes less than 2 seconds.  Neurological:     General: No focal deficit present.     Mental Status: She is alert and oriented to person, place, and time.    ED Results / Procedures / Treatments   Labs (all labs ordered are listed, but only abnormal results are displayed) Labs Reviewed - No data to display  EKG None  Radiology XR C-ARM NO REPORT  Result Date: 09/27/2021 Please see Notes tab for imaging impression.   Procedures Procedures   Medications Ordered in ED Medications  oxyCODONE (Oxy IR/ROXICODONE) immediate release tablet 5 mg (has no administration in time range)  ketorolac (TORADOL) injection 30 mg (has no administration in time range)    ED Course  I have reviewed the triage vital signs and the nursing notes.  Pertinent labs & imaging results that were available during my care of the patient were reviewed by me and considered in my medical decision making (see chart for details).    MDM  Rules/Calculators/A&P                           Offered CT hip patient does not want that.  She just wants pain relief.  Has been no falls.  But orthopedics did raise some concern that there could possibly be a hairline fracture which she do not have to have a fall to occur.  Patient is refusing that.  We will retreat her with what worked on the 19th which is some oral oxycodone and Toradol IM.  Patient's prescription for the hydrocodone technically still in effect until the 31st.  But will try to provide a prescription for oxycodone that she can take on top of that.  If they will not fill it early.  Release then should be to get that filled on 31st.  Patient with significant improvement with the oxycodone and Toradol.  Patient wants to go home.   Final Clinical Impression(s) / ED Diagnoses Final diagnoses:  Hip pain, chronic, left    Rx / DC Orders ED Discharge Orders     None        Vanetta Mulders, MD 09/29/21 1444

## 2021-09-29 NOTE — Telephone Encounter (Signed)
Called patient again. States patient is not available and will have her call us.

## 2021-09-29 NOTE — ED Triage Notes (Signed)
Pt arrives to ED with c/o of left hip pain. She reports that the pain in her left hip had gradually worsened. She reports that she is in need of a left hip replacement but Ortho won't do surgery due to her heart health and hx.

## 2021-10-04 ENCOUNTER — Encounter: Payer: Self-pay | Admitting: Orthopaedic Surgery

## 2021-10-04 ENCOUNTER — Other Ambulatory Visit: Payer: Self-pay

## 2021-10-04 ENCOUNTER — Ambulatory Visit: Payer: Medicare Other | Admitting: Orthopaedic Surgery

## 2021-10-04 DIAGNOSIS — M1612 Unilateral primary osteoarthritis, left hip: Secondary | ICD-10-CM

## 2021-10-04 MED ORDER — OXYCODONE HCL 5 MG PO TABS: 5.0000 mg | ORAL_TABLET | Freq: Four times a day (QID) | ORAL | 0 refills | Status: DC | PRN

## 2021-10-04 NOTE — Addendum Note (Signed)
Addended by: Albertina Parr on: 10/04/2021 09:31 AM   Modules accepted: Orders

## 2021-10-04 NOTE — Progress Notes (Signed)
Office Visit Note   Patient: Sierra Wright           Date of Birth: [birth year]           MRN: [id number] Visit Date: 10/04/2021              Requested by: Sierra Palmer, MD [address] [place],  [place] [address] PCP: Sierra Palmer, MD   Assessment & Plan: Visit Diagnoses:  1. Primary osteoarthritis of left hip     Plan: Impression is severe left hip pain.  She does have advanced degenerative disease but given the amount of pain that she is in I think it is appropriate to obtain MRI to rule out an occult fracture.  Percocet refilled today.  Follow-up after the MRI.  Follow-Up Instructions: No follow-ups on file.   Orders:  No orders of the defined types were placed in this encounter.  Meds ordered this encounter  Medications   oxyCODONE (ROXICODONE) 5 MG immediate release tablet    Sig: Take 1 tablet (5 mg total) by mouth every 6 (six) hours as needed for severe pain.    Dispense:  30 tablet    Refill:  0      Procedures: No procedures performed   Clinical Data: No additional findings.   Subjective: Chief Complaint  Patient presents with   Left Hip - Pain    HPI  Sierra Wright comes in for continued severe left hip pain.   We saw her couple weeks ago and she had a cortisone injection which she states did not help and actually made the pain worse.  In the meantime she went to the ED for severe pain.  She has noticed that Percocet does help the pain and keeps it at bay.  Review of Systems   Objective: Vital Signs: There were no vitals taken for this visit.  Physical Exam  Ortho Exam  Left hip exam is unchanged.  Specialty Comments:  No specialty comments available.  Imaging: No results found.   PMFS History: Patient Active Problem List   Diagnosis Date Noted   Diabetic neuropathy (HCC) 01/25/2021   Diabetic retinopathy (HCC) 01/25/2021   Hyperglycemia due to type 2 diabetes mellitus (HCC) 01/25/2021   Unspecified  mononeuropathy of right lower limb 01/25/2021   S/P cardiac cath 08/13/18 stable CAD 08/14/2018   Orthopnea 08/12/2018   Chest pain 09/01/2016   Diabetic polyneuropathy associated with diabetes mellitus due to underlying condition (HCC) 07/26/2016   Chronic daily headache 07/26/2016   RLS (restless legs syndrome) 07/26/2016   Ulnar neuropathy of right upper extremity 07/26/2016   Ischemic stroke (HCC) 07/26/2016   CVA (cerebral infarction): Early subacute 07/14/2016   Back pain 07/14/2016   Essential hypertension    2nd degree AV block    Thyroid activity decreased    Coronary artery disease involving coronary bypass graft of native heart without angina pectoris    Binocular vision disorder with diplopia 07/13/2016   Weakness 07/13/2016   Chronic diastolic CHF (congestive heart failure) (HCC) 04/16/2016   Osteoporosis 07/27/2015    Class: Chronic   Spinal stenosis, lumbar region, with neurogenic claudication 07/26/2015    Class: Chronic   Spondylolisthesis of lumbar region 07/26/2015    Class: Chronic   Hx of CABG 03/15/2014   Osteoarthritis 03/15/2014   DM type 2 goal A1C below 7.5 03/15/2014   Peripheral edema 03/15/2014   Benign hypertensive heart disease without heart failure 03/15/2014   Right bundle branch  block 03/15/2014   First degree AV block 03/15/2014   Sinus bradycardia by electrocardiogram 03/15/2014   Dizziness 03/15/2014   Past Medical History:  Diagnosis Date   Arthritis    Coronary artery disease 1999   CABG   Depression    Diabetes mellitus without complication (HCC)    type II; metformin and Amaryl   Diabetic neuropathy (HCC)    History of anemia as a child    HOH (hard of hearing)    Hypertension    Hypothyroidism    synthroid   PONV (postoperative nausea and vomiting)    Restless leg syndrome    S/P cardiac cath 08/13/18 stable CAD 08/14/2018   Shortness of breath 09/03/12   ECHO- The LA is Moderately Dilated, mild mitral annular calcification.  mild pulmonary hypertension, moderate concentric LV hypertrophy. Atrial septum is aneurysmal. Aortic valve appears to be mildly sclerotic.EF 88%   Urinary incontinence    Weakness of both legs     Family History  Problem Relation Age of Onset   Other Mother    Pneumonia Mother    Heart attack Father    Liver cancer Sister    Stroke Sister    Heart attack Brother 36   Stroke Son 25   Healthy Daughter     Past Surgical History:  Procedure Laterality Date   ABDOMINAL HYSTERECTOMY     APPENDECTOMY     BACK SURGERY     CARDIAC CATHERIZATION  08/20/03   Widely patent bypass grafts. Normal left ventricular systolic function.   CARDIAC CATHETERIZATION     CHOLECYSTECTOMY     COLONOSCOPY     CORONARY ARTERY BYPASS GRAFT     EYE SURGERY     bilateral cataracts   LEFT HEART CATH AND CORONARY ANGIOGRAPHY N/A 08/13/2018   Procedure: LEFT HEART CATH AND CORONARY ANGIOGRAPHY;  Surgeon: Kathleene Hazel, MD;  Location: [facility];  Service: Cardiovascular;  Laterality: N/A;   LUMBAR LAMINECTOMY/DECOMPRESSION MICRODISCECTOMY N/A 07/26/2015   Procedure: BILATERAL Lumbar 3-4 SUBTOTAL HEMILAMINECTOMY WITH LATERAL RECESS DECOMPRESSION;  Surgeon: Kerrin Champagne, MD;  Location: [facility] OR;  Service: Orthopedics;  Laterality: N/A;   SHOULDER SURGERY     TUBAL LIGATION     Social History   Occupational History   Not on file  Tobacco Use   Smoking status: Never    Passive exposure: Never   Smokeless tobacco: Never  Vaping Use   Vaping Use: Never used  Substance and Sexual Activity   Alcohol use: No   Drug use: No   Sexual activity: Not on file

## 2021-10-05 ENCOUNTER — Inpatient Hospital Stay: Admission: RE | Admit: 2021-10-05 | Payer: Medicare Other | Source: Ambulatory Visit

## 2021-10-05 ENCOUNTER — Other Ambulatory Visit: Payer: Medicare Other

## 2021-10-05 ENCOUNTER — Ambulatory Visit
Admission: RE | Admit: 2021-10-05 | Discharge: 2021-10-05 | Disposition: A | Discharge status: Home / Self Care | Payer: Medicare Other | Source: Ambulatory Visit | Attending: Orthopaedic Surgery | Admitting: Orthopaedic Surgery

## 2021-10-05 DIAGNOSIS — M1612 Unilateral primary osteoarthritis, left hip: Secondary | ICD-10-CM

## 2021-10-05 DIAGNOSIS — M25552 Pain in left hip: Secondary | ICD-10-CM | POA: Diagnosis not present

## 2021-10-05 NOTE — Progress Notes (Signed)
Please let patient know that MRI did not show any fractures.  Confirmed that her arthritis is really bad.

## 2021-10-06 NOTE — Progress Notes (Signed)
Called patient and advised

## 2021-10-09 DIAGNOSIS — R112 Nausea with vomiting, unspecified: Secondary | ICD-10-CM | POA: Diagnosis not present

## 2021-10-09 DIAGNOSIS — R1013 Epigastric pain: Secondary | ICD-10-CM | POA: Diagnosis not present

## 2021-10-09 DIAGNOSIS — R197 Diarrhea, unspecified: Secondary | ICD-10-CM | POA: Diagnosis not present

## 2021-10-11 ENCOUNTER — Emergency Department (HOSPITAL_COMMUNITY): Payer: Medicare Other

## 2021-10-11 ENCOUNTER — Encounter (HOSPITAL_COMMUNITY): Payer: Self-pay | Admitting: Radiology

## 2021-10-11 ENCOUNTER — Inpatient Hospital Stay (HOSPITAL_COMMUNITY)
Admission: EM | Admit: 2021-10-11 | Discharge: 2021-10-23 | DRG: 543 | Disposition: A | Discharge status: Skilled Nursing Facility | Payer: Medicare Other | Attending: Internal Medicine | Admitting: Internal Medicine

## 2021-10-11 ENCOUNTER — Telehealth: Payer: Self-pay

## 2021-10-11 ENCOUNTER — Other Ambulatory Visit: Payer: Self-pay

## 2021-10-11 ENCOUNTER — Ambulatory Visit: Payer: Medicare Other | Admitting: Specialist

## 2021-10-11 ENCOUNTER — Other Ambulatory Visit: Payer: Self-pay | Admitting: Physician Assistant

## 2021-10-11 DIAGNOSIS — E860 Dehydration: Secondary | ICD-10-CM | POA: Diagnosis not present

## 2021-10-11 DIAGNOSIS — N390 Urinary tract infection, site not specified: Secondary | ICD-10-CM | POA: Diagnosis not present

## 2021-10-11 DIAGNOSIS — E861 Hypovolemia: Secondary | ICD-10-CM | POA: Diagnosis not present

## 2021-10-11 DIAGNOSIS — R2681 Unsteadiness on feet: Secondary | ICD-10-CM | POA: Diagnosis not present

## 2021-10-11 DIAGNOSIS — E1140 Type 2 diabetes mellitus with diabetic neuropathy, unspecified: Secondary | ICD-10-CM | POA: Diagnosis not present

## 2021-10-11 DIAGNOSIS — I2510 Atherosclerotic heart disease of native coronary artery without angina pectoris: Secondary | ICD-10-CM | POA: Diagnosis present

## 2021-10-11 DIAGNOSIS — R001 Bradycardia, unspecified: Secondary | ICD-10-CM | POA: Diagnosis not present

## 2021-10-11 DIAGNOSIS — I110 Hypertensive heart disease with heart failure: Secondary | ICD-10-CM | POA: Diagnosis not present

## 2021-10-11 DIAGNOSIS — S82392A Other fracture of lower end of left tibia, initial encounter for closed fracture: Secondary | ICD-10-CM | POA: Diagnosis not present

## 2021-10-11 DIAGNOSIS — I272 Pulmonary hypertension, unspecified: Secondary | ICD-10-CM | POA: Diagnosis not present

## 2021-10-11 DIAGNOSIS — M80062A Age-related osteoporosis with current pathological fracture, left lower leg, initial encounter for fracture: Secondary | ICD-10-CM | POA: Diagnosis not present

## 2021-10-11 DIAGNOSIS — Z8249 Family history of ischemic heart disease and other diseases of the circulatory system: Secondary | ICD-10-CM

## 2021-10-11 DIAGNOSIS — Q7290 Unspecified reduction defect of unspecified lower limb: Secondary | ICD-10-CM | POA: Diagnosis not present

## 2021-10-11 DIAGNOSIS — Z741 Need for assistance with personal care: Secondary | ICD-10-CM | POA: Diagnosis not present

## 2021-10-11 DIAGNOSIS — R110 Nausea: Secondary | ICD-10-CM

## 2021-10-11 DIAGNOSIS — Z888 Allergy status to other drugs, medicaments and biological substances status: Secondary | ICD-10-CM

## 2021-10-11 DIAGNOSIS — Z743 Need for continuous supervision: Secondary | ICD-10-CM | POA: Diagnosis not present

## 2021-10-11 DIAGNOSIS — I10 Essential (primary) hypertension: Secondary | ICD-10-CM | POA: Diagnosis not present

## 2021-10-11 DIAGNOSIS — Z90710 Acquired absence of both cervix and uterus: Secondary | ICD-10-CM

## 2021-10-11 DIAGNOSIS — M7989 Other specified soft tissue disorders: Secondary | ICD-10-CM | POA: Diagnosis not present

## 2021-10-11 DIAGNOSIS — E785 Hyperlipidemia, unspecified: Secondary | ICD-10-CM | POA: Diagnosis present

## 2021-10-11 DIAGNOSIS — S82891A Other fracture of right lower leg, initial encounter for closed fracture: Secondary | ICD-10-CM | POA: Diagnosis not present

## 2021-10-11 DIAGNOSIS — S82841A Displaced bimalleolar fracture of right lower leg, initial encounter for closed fracture: Secondary | ICD-10-CM | POA: Diagnosis not present

## 2021-10-11 DIAGNOSIS — I4892 Unspecified atrial flutter: Secondary | ICD-10-CM | POA: Diagnosis not present

## 2021-10-11 DIAGNOSIS — E875 Hyperkalemia: Secondary | ICD-10-CM | POA: Diagnosis not present

## 2021-10-11 DIAGNOSIS — N281 Cyst of kidney, acquired: Secondary | ICD-10-CM | POA: Diagnosis not present

## 2021-10-11 DIAGNOSIS — S82431A Displaced oblique fracture of shaft of right fibula, initial encounter for closed fracture: Secondary | ICD-10-CM | POA: Diagnosis present

## 2021-10-11 DIAGNOSIS — E669 Obesity, unspecified: Secondary | ICD-10-CM | POA: Diagnosis present

## 2021-10-11 DIAGNOSIS — M1612 Unilateral primary osteoarthritis, left hip: Secondary | ICD-10-CM | POA: Diagnosis present

## 2021-10-11 DIAGNOSIS — E871 Hypo-osmolality and hyponatremia: Secondary | ICD-10-CM | POA: Diagnosis not present

## 2021-10-11 DIAGNOSIS — S82841D Displaced bimalleolar fracture of right lower leg, subsequent encounter for closed fracture with routine healing: Secondary | ICD-10-CM | POA: Diagnosis not present

## 2021-10-11 DIAGNOSIS — Z7982 Long term (current) use of aspirin: Secondary | ICD-10-CM

## 2021-10-11 DIAGNOSIS — Z9181 History of falling: Secondary | ICD-10-CM | POA: Diagnosis not present

## 2021-10-11 DIAGNOSIS — Z6833 Body mass index (BMI) 33.0-33.9, adult: Secondary | ICD-10-CM

## 2021-10-11 DIAGNOSIS — G2581 Restless legs syndrome: Secondary | ICD-10-CM | POA: Diagnosis present

## 2021-10-11 DIAGNOSIS — S82892A Other fracture of left lower leg, initial encounter for closed fracture: Secondary | ICD-10-CM

## 2021-10-11 DIAGNOSIS — D649 Anemia, unspecified: Secondary | ICD-10-CM | POA: Diagnosis present

## 2021-10-11 DIAGNOSIS — I5032 Chronic diastolic (congestive) heart failure: Secondary | ICD-10-CM | POA: Diagnosis present

## 2021-10-11 DIAGNOSIS — Z7902 Long term (current) use of antithrombotics/antiplatelets: Secondary | ICD-10-CM

## 2021-10-11 DIAGNOSIS — Z20822 Contact with and (suspected) exposure to covid-19: Secondary | ICD-10-CM | POA: Diagnosis not present

## 2021-10-11 DIAGNOSIS — B961 Klebsiella pneumoniae [K. pneumoniae] as the cause of diseases classified elsewhere: Secondary | ICD-10-CM | POA: Diagnosis not present

## 2021-10-11 DIAGNOSIS — D72829 Elevated white blood cell count, unspecified: Secondary | ICD-10-CM | POA: Diagnosis not present

## 2021-10-11 DIAGNOSIS — E039 Hypothyroidism, unspecified: Secondary | ICD-10-CM | POA: Diagnosis present

## 2021-10-11 DIAGNOSIS — G8929 Other chronic pain: Secondary | ICD-10-CM | POA: Diagnosis present

## 2021-10-11 DIAGNOSIS — S82899A Other fracture of unspecified lower leg, initial encounter for closed fracture: Secondary | ICD-10-CM | POA: Diagnosis not present

## 2021-10-11 DIAGNOSIS — Q7291 Unspecified reduction defect of right lower limb: Secondary | ICD-10-CM | POA: Diagnosis not present

## 2021-10-11 DIAGNOSIS — I480 Paroxysmal atrial fibrillation: Secondary | ICD-10-CM | POA: Diagnosis present

## 2021-10-11 DIAGNOSIS — N179 Acute kidney failure, unspecified: Secondary | ICD-10-CM

## 2021-10-11 DIAGNOSIS — M6281 Muscle weakness (generalized): Secondary | ICD-10-CM | POA: Diagnosis not present

## 2021-10-11 DIAGNOSIS — Z794 Long term (current) use of insulin: Secondary | ICD-10-CM

## 2021-10-11 DIAGNOSIS — R296 Repeated falls: Secondary | ICD-10-CM | POA: Diagnosis not present

## 2021-10-11 DIAGNOSIS — I639 Cerebral infarction, unspecified: Secondary | ICD-10-CM | POA: Diagnosis not present

## 2021-10-11 DIAGNOSIS — M80071A Age-related osteoporosis with current pathological fracture, right ankle and foot, initial encounter for fracture: Principal | ICD-10-CM | POA: Diagnosis present

## 2021-10-11 DIAGNOSIS — E1151 Type 2 diabetes mellitus with diabetic peripheral angiopathy without gangrene: Secondary | ICD-10-CM | POA: Diagnosis present

## 2021-10-11 DIAGNOSIS — S82842D Displaced bimalleolar fracture of left lower leg, subsequent encounter for closed fracture with routine healing: Secondary | ICD-10-CM | POA: Diagnosis not present

## 2021-10-11 DIAGNOSIS — R6889 Other general symptoms and signs: Secondary | ICD-10-CM | POA: Diagnosis not present

## 2021-10-11 DIAGNOSIS — R0902 Hypoxemia: Secondary | ICD-10-CM | POA: Diagnosis not present

## 2021-10-11 DIAGNOSIS — M25571 Pain in right ankle and joints of right foot: Secondary | ICD-10-CM | POA: Diagnosis not present

## 2021-10-11 DIAGNOSIS — S82851A Displaced trimalleolar fracture of right lower leg, initial encounter for closed fracture: Secondary | ICD-10-CM | POA: Diagnosis not present

## 2021-10-11 DIAGNOSIS — W010XXA Fall on same level from slipping, tripping and stumbling without subsequent striking against object, initial encounter: Secondary | ICD-10-CM | POA: Diagnosis present

## 2021-10-11 DIAGNOSIS — Y92481 Parking lot as the place of occurrence of the external cause: Secondary | ICD-10-CM | POA: Diagnosis not present

## 2021-10-11 DIAGNOSIS — Z79899 Other long term (current) drug therapy: Secondary | ICD-10-CM

## 2021-10-11 DIAGNOSIS — Z886 Allergy status to analgesic agent status: Secondary | ICD-10-CM

## 2021-10-11 DIAGNOSIS — M25572 Pain in left ankle and joints of left foot: Secondary | ICD-10-CM | POA: Diagnosis not present

## 2021-10-11 DIAGNOSIS — I517 Cardiomegaly: Secondary | ICD-10-CM | POA: Diagnosis not present

## 2021-10-11 DIAGNOSIS — K5900 Constipation, unspecified: Secondary | ICD-10-CM | POA: Diagnosis not present

## 2021-10-11 DIAGNOSIS — Z7984 Long term (current) use of oral hypoglycemic drugs: Secondary | ICD-10-CM

## 2021-10-11 DIAGNOSIS — S82832A Other fracture of upper and lower end of left fibula, initial encounter for closed fracture: Secondary | ICD-10-CM | POA: Diagnosis not present

## 2021-10-11 DIAGNOSIS — I739 Peripheral vascular disease, unspecified: Secondary | ICD-10-CM | POA: Diagnosis not present

## 2021-10-11 DIAGNOSIS — Z951 Presence of aortocoronary bypass graft: Secondary | ICD-10-CM

## 2021-10-11 DIAGNOSIS — Z8673 Personal history of transient ischemic attack (TIA), and cerebral infarction without residual deficits: Secondary | ICD-10-CM

## 2021-10-11 DIAGNOSIS — S82899D Other fracture of unspecified lower leg, subsequent encounter for closed fracture with routine healing: Secondary | ICD-10-CM | POA: Diagnosis not present

## 2021-10-11 DIAGNOSIS — Z79890 Hormone replacement therapy (postmenopausal): Secondary | ICD-10-CM

## 2021-10-11 DIAGNOSIS — S82831A Other fracture of upper and lower end of right fibula, initial encounter for closed fracture: Secondary | ICD-10-CM | POA: Diagnosis not present

## 2021-10-11 LAB — COMPREHENSIVE METABOLIC PANEL
ALT: 8 U/L (ref 0–44)
AST: 14 U/L — ABNORMAL LOW (ref 15–41)
Albumin: 3.4 g/dL — ABNORMAL LOW (ref 3.5–5.0)
Alkaline Phosphatase: 81 U/L (ref 38–126)
Anion gap: 5 (ref 5–15)
BUN: 38 mg/dL — ABNORMAL HIGH (ref 8–23)
CO2: 25 mmol/L (ref 22–32)
Calcium: 8.3 mg/dL — ABNORMAL LOW (ref 8.9–10.3)
Chloride: 108 mmol/L (ref 98–111)
Creatinine, Ser: 2.21 mg/dL — ABNORMAL HIGH (ref 0.44–1.00)
GFR, Estimated: 21 mL/min — ABNORMAL LOW (ref >60)
Glucose, Bld: 208 mg/dL — ABNORMAL HIGH (ref 70–99)
Potassium: 4.9 mmol/L (ref 3.5–5.1)
Sodium: 138 mmol/L (ref 135–145)
Total Bilirubin: 0.4 mg/dL (ref 0.3–1.2)
Total Protein: 5.8 g/dL — ABNORMAL LOW (ref 6.5–8.1)

## 2021-10-11 LAB — CBC WITH DIFFERENTIAL/PLATELET
Abs Immature Granulocytes: 0.07 10*3/uL (ref 0.00–0.07)
Basophils Absolute: 0.1 10*3/uL (ref 0.0–0.1)
Basophils Relative: 1 %
Eosinophils Absolute: 0.1 10*3/uL (ref 0.0–0.5)
Eosinophils Relative: 1 %
HCT: 32.4 % — ABNORMAL LOW (ref 36.0–46.0)
Hemoglobin: 10.4 g/dL — ABNORMAL LOW (ref 12.0–15.0)
Immature Granulocytes: 1 %
Lymphocytes Relative: 12 %
Lymphs Abs: 1.3 10*3/uL (ref 0.7–4.0)
MCH: 31.8 pg (ref 26.0–34.0)
MCHC: 32.1 g/dL (ref 30.0–36.0)
MCV: 99.1 fL (ref 80.0–100.0)
Monocytes Absolute: 0.9 10*3/uL (ref 0.1–1.0)
Monocytes Relative: 9 %
Neutro Abs: 8.3 10*3/uL — ABNORMAL HIGH (ref 1.7–7.7)
Neutrophils Relative %: 76 %
Platelets: 152 10*3/uL (ref 150–400)
RBC: 3.27 MIL/uL — ABNORMAL LOW (ref 3.87–5.11)
RDW: 14 % (ref 11.5–15.5)
WBC: 10.8 10*3/uL — ABNORMAL HIGH (ref 4.0–10.5)
nRBC: 0 % (ref 0.0–0.2)

## 2021-10-11 LAB — RESP PANEL BY RT-PCR (FLU A&B, COVID) ARPGX2
Influenza A by PCR: NEGATIVE
Influenza B by PCR: NEGATIVE
SARS Coronavirus 2 by RT PCR: NEGATIVE

## 2021-10-11 MED ORDER — FENTANYL CITRATE PF 50 MCG/ML IJ SOSY
25.0000 ug | PREFILLED_SYRINGE | Freq: Once | INTRAMUSCULAR | Status: AC
  Administered 2021-10-11: 25 ug via INTRAVENOUS
  Filled 2021-10-11: qty 1

## 2021-10-11 MED ORDER — ATROPINE SULFATE 1 MG/10ML IJ SOSY
PREFILLED_SYRINGE | INTRAMUSCULAR | Status: AC
  Filled 2021-10-11: qty 10

## 2021-10-11 MED ORDER — FENTANYL CITRATE PF 50 MCG/ML IJ SOSY
50.0000 ug | PREFILLED_SYRINGE | Freq: Once | INTRAMUSCULAR | Status: AC
  Administered 2021-10-11: 50 ug via INTRAVENOUS
  Filled 2021-10-11: qty 1

## 2021-10-11 MED ORDER — ETOMIDATE 2 MG/ML IV SOLN
8.0000 mg | Freq: Once | INTRAVENOUS | Status: AC
  Administered 2021-10-11: 8 mg via INTRAVENOUS
  Filled 2021-10-11: qty 10

## 2021-10-11 MED ORDER — OXYCODONE HCL 5 MG PO TABS: 5.0000 mg | ORAL_TABLET | Freq: Four times a day (QID) | ORAL | 0 refills | Status: DC | PRN

## 2021-10-11 MED ORDER — ONDANSETRON HCL 4 MG/2ML IJ SOLN
4.0000 mg | Freq: Once | INTRAMUSCULAR | Status: AC
  Administered 2021-10-11: 4 mg via INTRAVENOUS
  Filled 2021-10-11: qty 2

## 2021-10-11 MED ORDER — SODIUM CHLORIDE 0.9 % IV BOLUS
1000.0000 mL | Freq: Once | INTRAVENOUS | Status: AC
  Administered 2021-10-11: 1000 mL via INTRAVENOUS

## 2021-10-11 NOTE — ED Triage Notes (Addendum)
Pt here after she had a mechanical fall at her pcp office. This was a witnessed by her daughter. Pt has deformity to her right ankle and swelling noted to bil ankles. Pt also states that she has had abdominal pain and diarrhea for the past several days. Pt is lethargic at this time due to medication received prior to arrival. Pt is on Plavix but didn't hit head and had no LOC.

## 2021-10-11 NOTE — Telephone Encounter (Signed)
Patients daughter aware

## 2021-10-11 NOTE — Discharge Instructions (Addendum)

## 2021-10-11 NOTE — Telephone Encounter (Signed)
Sent in

## 2021-10-11 NOTE — Telephone Encounter (Signed)
Pts daughter called in regarding a refill for her mothers oxycodone. Please advise.

## 2021-10-11 NOTE — ED Provider Notes (Addendum)
[facility] DEPT Provider Note   CSN: [id number] Arrival date & time: 10/11/21  1738     History Chief Complaint  Patient presents with   Ankle Pain    Mechanical fall witnessed, right ankle deformity.   Diarrhea    X several days   Hip Pain    Left hip pain hurting even prior to fall, pt has had this worked up in the past.    Sierra Wright is a 87 y.o. female.  This is a 87 y.o. female with significant medical history as below, including afib, hoh, htn, hld who presents to the ED with complaint of fall.  Accompanied by daughter.  Daughter reports patient was going to a doctor's appointment this afternoon, while getting out of her vehicle and approaching her walker she lost her balance and fell to the ground.  Fell straight down onto her bottom with her legs underneath her.  Immediate pain to bilateral ankles, no head injury, no LOC.  Pain described as sharp, stabbing.  No Pain to bilateral knees.  Chronic pain to right hip which is unchanged.  No thinners.  She was given opiate medication at PCPs office and became delirious, sleepy after pain medications. Pt was acting normally until she was given opiate. Again no head injury or loc.   Reports patient's been having nausea, vomiting, diarrhea over the past 1.5 weeks which has been steadily improving.  She was at the doctor's office for repeat check on ongoing complaints. Evaluate for dehydration.   Level 5 caveat, altered/recent opiate administration  The history is provided by the patient and a relative. No language interpreter was used.  Ankle Pain Diarrhea Associated symptoms: arthralgias and vomiting   Hip Pain      Past Medical History:  Diagnosis Date   Arthritis    Coronary artery disease 1999   CABG   Depression    Diabetes mellitus without complication ([place])    type II; metformin and Amaryl   Diabetic neuropathy ([place])    History of anemia as a child    HOH (hard of hearing)     Hypertension    Hypothyroidism    synthroid   PONV (postoperative nausea and vomiting)    Restless leg syndrome    S/P cardiac cath 08/13/18 stable CAD 08/14/2018   Shortness of breath 09/03/12   ECHO- The LA is Moderately Dilated, mild mitral annular calcification. mild pulmonary hypertension, moderate concentric LV hypertrophy. Atrial septum is aneurysmal. Aortic valve appears to be mildly sclerotic.EF 88%   Urinary incontinence    Weakness of both legs     Patient Active Problem List   Diagnosis Date Noted   Ankle fracture 10/12/2021   Diabetic neuropathy ([place]) 01/25/2021   Diabetic retinopathy ([place]) 01/25/2021   Hyperglycemia due to type 2 diabetes mellitus ([place]) 01/25/2021   Unspecified mononeuropathy of right lower limb 01/25/2021   S/P cardiac cath 08/13/18 stable CAD 08/14/2018   Orthopnea 08/12/2018   Chest pain 09/01/2016   Diabetic polyneuropathy associated with diabetes mellitus due to underlying condition ([place]) 07/26/2016   Chronic daily headache 07/26/2016   RLS (restless legs syndrome) 07/26/2016   Ulnar neuropathy of right upper extremity 07/26/2016   Ischemic stroke ([place]) 07/26/2016   CVA (cerebral infarction): Early subacute 07/14/2016   Back pain 07/14/2016   Essential hypertension    2nd degree AV block    Thyroid activity decreased    Coronary artery disease involving coronary bypass graft of native heart without  angina pectoris    Binocular vision disorder with diplopia 07/13/2016   Weakness 07/13/2016   Chronic diastolic CHF (congestive heart failure) (H[facility]) 04/16/2016   Osteoporosis 07/27/2015    Class: Chronic   Spinal stenosis, lumbar region, with neurogenic claudication 07/26/2015    Class: Chronic   Spondylolisthesis of lumbar region 07/26/2015    Class: Chronic   Hx of CABG 03/15/2014   Osteoarthritis 03/15/2014   DM type 2 goal A1C below 7.5 03/15/2014   Peripheral edema 03/15/2014   Benign hypertensive heart disease without heart failure  03/15/2014   Right bundle branch block 03/15/2014   First degree AV block 03/15/2014   Sinus bradycardia by electrocardiogram 03/15/2014   Dizziness 03/15/2014    Past Surgical History:  Procedure Laterality Date   ABDOMINAL HYSTERECTOMY     APPENDECTOMY     BACK SURGERY     CARDIAC CATHERIZATION  08/20/03   Widely patent bypass grafts. Normal left ventricular systolic function.   CARDIAC CATHETERIZATION     CHOLECYSTECTOMY     COLONOSCOPY     CORONARY ARTERY BYPASS GRAFT     EYE SURGERY     bilateral cataracts   LEFT HEART CATH AND CORONARY ANGIOGRAPHY N/A 08/13/2018   Procedure: LEFT HEART CATH AND CORONARY ANGIOGRAPHY;  Surgeon: Kathleene Hazel, MD;  Location: [facility] IN[facility]ASIVE [facility];  Service: Cardiovascular;  Laterality: N/A;   LUMBAR LAMINECTOMY/DECOMPRESSION MICRODISCECTOMY N/A 07/26/2015   Procedure: BILATERAL Lumbar 3-4 SUBTOTAL HEMILAMINECTOMY WITH LATERAL RECESS DECOMPRESSION;  Surgeon: Kerrin Champagne, MD;  Location: [facility] OR;  Service: Orthopedics;  Laterality: N/A;   SHOULDER SURGERY     TUBAL LIGATION       OB History   No obstetric history on file.     Family History  Problem Relation Age of Onset   Other Mother    Pneumonia Mother    Heart attack Father    Liver cancer Sister    Stroke Sister    Heart attack Brother 19   Stroke Son 74   Healthy Daughter     Social History   Tobacco Use   Smoking status: Never    Passive exposure: Never   Smokeless tobacco: Never  Vaping Use   Vaping Use: Never used  Substance Use Topics   Alcohol use: No   Drug use: No    Home Medications Prior to Admission medications   Medication Sig Start Date End Date Taking? Authorizing Provider  amLODipine (NORVASC) 5 MG tablet Take 1 tablet by mouth once daily Patient taking differently: Take 5 mg by mouth daily. 04/11/21  Yes [name], Deloris Ping, MD  Ascorbic Acid (VITAMIN C) 1000 MG tablet Take 1,000 mg by mouth daily.   Yes [provider]  aspirin EC 81 MG  EC tablet Take 1 tablet (81 mg total) by mouth daily. 08/15/18  Yes Leone Brand, NP  benazepril (LOTENSIN) 40 MG tablet Take 1 tablet by mouth once daily Patient taking differently: Take 40 mg by mouth daily. 07/24/21  Yes [name], Deloris Ping, MD  calcium carbonate (OS-CAL - DOSED IN MG OF ELEMENTAL CALCIUM) 1250 (500 Ca) MG tablet Take 1 tablet by mouth daily.   Yes [provider]  carbidopa-levodopa (SINEMET IR) 25-100 MG tablet Take 1.5 tablets by mouth at bedtime. 12/16/20  Yes Patel, Donika K, DO  cholecalciferol (VITAMIN D3) 25 MCG (1000 UNIT) tablet Take 1,000 Units by mouth daily.   Yes [provider]  clopidogrel (PLAVIX) 75 MG tablet Take 1  tablet by mouth once daily Patient taking differently: Take 75 mg by mouth daily. 07/24/21  Yes [name], [name], MD  diazepam (VALIUM) 10 MG tablet Take 10 mg by mouth daily as needed for anxiety.   Yes [provider]  furosemide (LASIX) 40 MG tablet Take 1 tablet by mouth twice daily Patient taking differently: Take 40 mg by mouth 2 (two) times daily. 07/24/21  Yes Bhagat, Bhavinkumar, PA  Garlic [id number] MG CAPS Take 1,000 mg by mouth daily.    Yes [provider]  glimepiride (AMARYL) 1 MG tablet Take 1 mg by mouth daily with breakfast.   Yes [provider]  hydrALAZINE (APRESOLINE) 25 MG tablet TAKE 1 TABLET BY MOUTH THREE TIMES DAILY Patient taking differently: Take 25 mg by mouth 2 (two) times daily. 11/22/20  Yes [name], [name], MD  isosorbide mononitrate (IMDUR) 30 MG 24 hr tablet Take 1 tablet by mouth once daily Patient taking differently: Take 30 mg by mouth daily. 07/10/21  Yes [name], [name], MD  LANTUS SOLOSTAR 100 UNIT/ML Solostar Pen Inject 26 Units into the skin daily. 07/10/21  Yes [provider]  levothyroxine (SYNTHROID, LEVOTHROID) 100 MCG tablet Take 100 mcg by mouth daily before breakfast.   Yes [provider]  metoprolol succinate (TOPROL-XL) 25 MG 24 hr tablet Take  1 tablet by mouth once daily Patient taking differently: Take 25 mg by mouth daily. 06/07/21  Yes [name], [name], MD  nitroGLYCERIN (NITROSTAT) 0.4 MG SL tablet DISSOLVE ONE TABLET UNDER THE TONGUE EVERY 5 MINUTES AS NEEDED FOR CHEST PAIN. Patient taking differently: Place 0.4 mg under the tongue every 5 (five) minutes as needed for chest pain. 04/19/21  Yes [name], [name], MD  ondansetron (ZOFRAN) 4 MG tablet Take 4 mg by mouth every 6 (six) hours as needed for nausea. 08/20/20  Yes [provider]  oxyCODONE (ROXICODONE) 5 MG immediate release tablet Take 1 tablet (5 mg total) by mouth every 6 (six) hours as needed for severe pain. 10/11/21  Yes Aundra Dubin, PA-C  potassium chloride (K-DUR,KLOR-CON) 10 MEQ tablet Take 10 mEq by mouth 2 (two) times daily.   Yes [provider]  pregabalin (LYRICA) 50 MG capsule Take 50 mg by mouth 2 (two) times daily.   Yes [provider]  vitamin B-12 (CYANOCOBALAMIN) 1000 MCG tablet Take 1,000 mcg by mouth daily.   Yes [provider]  BD PEN NEEDLE NANO 2ND GEN 32G X 4 MM MISC daily. use as directed 01/17/21   [provider]  gabapentin (NEURONTIN) 100 MG capsule Take 1 capsule (100 mg total) by mouth 3 (three) times daily. Patient not taking: No sig reported 12/16/20   Narda Amber K, DO  HYDROcodone-acetaminophen (NORCO) 5-325 MG tablet Take 1 tablet by mouth every 6 (six) hours as needed. Patient not taking: No sig reported 09/27/21   Leandrew Koyanagi, MD  Insulin Pen Needle (BD PEN NEEDLE NANO 2ND GEN) 32G X 4 MM MISC See admin instructions.    [provider]  Lancets [facility] DELICA PLUS [id number]) MISC Apply topically 2 (two) times daily. 01/17/21   [provider]  Roma Schanz test strip SMARTSIG:Strip(s) Via Meter Twice Daily 01/17/21   [provider]    Allergies    Duloxetine hcl, Statins, Amitriptyline, Benadryl [diphenhydramine hcl (sleep)], Ezetimibe, Metformin hcl,  Other, Sitagliptin, Tylenol [acetaminophen], and Colesevelam hcl  Review of Systems   Review of Systems  Unable to perform ROS: Mental status  change  Gastrointestinal:  Positive for diarrhea, nausea and vomiting.  Musculoskeletal:  Positive for arthralgias, gait problem and joint swelling.  Neurological:  Negative for syncope.   Physical Exam Updated Vital Signs BP (!) 150/74   Pulse 68   Temp 98 F (36.7 C) (Oral)   Resp 17   Ht 5\' 6"  (1.676 m)   Wt 95.3 kg   SpO2 99%   BMI 33.89 kg/m   Physical Exam Vitals and nursing note reviewed.  Constitutional:      General: She is not in acute distress.    Appearance: Normal appearance.  HENT:     Head: Normocephalic and atraumatic.     Right Ear: External ear normal.     Left Ear: External ear normal.     Nose: Nose normal.     Mouth/Throat:     Mouth: Mucous membranes are moist.  Eyes:     General: No scleral icterus.       Right eye: No discharge.        Left eye: No discharge.  Cardiovascular:     Rate and Rhythm: Normal rate. Rhythm irregular.     Pulses: Normal pulses.          Radial pulses are 2+ on the right side and 2+ on the left side.       Dorsalis pedis pulses are 2+ on the right side and 2+ on the left side.     Heart sounds: Normal heart sounds.  Pulmonary:     Effort: Pulmonary effort is normal. No respiratory distress.     Breath sounds: Normal breath sounds.  Abdominal:     General: Abdomen is flat.     Tenderness: There is no abdominal tenderness.  Musculoskeletal:        General: Normal range of motion.     Cervical back: Normal range of motion.     Right lower leg: No edema.     Left lower leg: No edema.       Feet:  Feet:     Comments: 2+ dp pulses equal b/l Skin:    General: Skin is warm and dry.     Capillary Refill: Capillary refill takes less than 2 seconds.  Neurological:     Mental Status: She is alert.  Psychiatric:        Mood and Affect: Mood normal.        Behavior: Behavior  normal.    ED Results / Procedures / Treatments   Labs (all labs ordered are listed, but only abnormal results are displayed) Labs Reviewed  CBC WITH DIFFERENTIAL/PLATELET - Abnormal; Notable for the following components:      Result Value   WBC 10.8 (*)    RBC 3.27 (*)    Hemoglobin 10.4 (*)    HCT 32.4 (*)    Neutro Abs 8.3 (*)    All other components within normal limits  COMPREHENSIVE METABOLIC PANEL - Abnormal; Notable for the following components:   Glucose, Bld 208 (*)    BUN 38 (*)    Creatinine, Ser 2.21 (*)    Calcium 8.3 (*)    Total Protein 5.8 (*)    Albumin 3.4 (*)    AST 14 (*)    GFR, Estimated 21 (*)    All other components within normal limits  RESP PANEL BY RT-PCR (FLU A&B, COVID) [id number]    EKG None  Radiology DG Pelvis 1-2 Views  Result Date: 10/11/2021 CLINICAL DATA:  Status  post fall.  Pain EXAM: PELVIS - 1-2 VIEW COMPARISON:  None. FINDINGS: Markedly limited evaluation due to overlapping osseous structures and overlying soft tissues. There is no evidence of pelvic fracture or diastasis. No pelvic bone lesions are seen. Severe degenerative changes left hip. Degenerative changes of the sacroiliac joints. No hip dislocation. Vascular calcification. IMPRESSION: 1. Markedly limited evaluation due to overlapping osseous structures and overlying soft tissues. 2. No definite acute displaced fracture, hip dislocation, or pelvic diastasis. Electronically Signed   By: Iven Finn M.D.   On: 10/11/2021 21:00   DG Ankle Complete Left  Addendum Date: 10/11/2021   ADDENDUM REPORT: 10/11/2021 20:56 ADDENDUM: Findings should state no evidence of "severe" arthropathy or other focal bone abnormality. Findings should also mention diffuse decreased bone density. Electronically Signed   By: Iven Finn M.D.   On: 10/11/2021 20:56   Result Date: 10/11/2021 CLINICAL DATA:  fall pain EXAM: LEFT ANKLE COMPLETE - 3+ VIEW COMPARISON:  X-ray left tibia fibula 03/02/2005  FINDINGS: Age-indeterminate nondisplaced distal fibular fracture. There is no evidence of definite acute displaced fracture, dislocation, or joint effusion. Degenerative changes of the navicular noted. Plantar calcaneal spur. There is no evidence of arthropathy or other focal bone abnormality. Mild lateral ankle subcutaneus soft tissue edema. Vascular calcifications. IMPRESSION: indeterminate nondisplaced distal fibular fracture. Associated lateral ankle subcutaneus soft tissue edema. Correlate with point tenderness to evaluate for acuity. Electronically Signed: By: Iven Finn M.D. On: 10/11/2021 20:53   DG Ankle Complete Right  Result Date: 10/11/2021 CLINICAL DATA:  Status fall EXAM: RIGHT ANKLE - COMPLETE 3+ VIEW COMPARISON:  None. FINDINGS: Diffusely decreased bone density. Acute inferiorly displaced medial malleolar fracture. Acute displaced distal fibular fracture. No definite posterior malleolar fracture. Vague cortical irregularity the medial talus with no definite acute displaced talar fracture. Degenerative changes of the midfoot. Plantar calcaneal spur. There is no evidence of severe arthropathy or other focal bone abnormality. Subcutaneus soft tissue edema of the ankle. Vascular clips overlie the soft tissues of the right distal leg. Vascular calcifications. IMPRESSION: 1. Bimalleolar acute displaced fracture. 2. Vague cortical irregularity the medial talus with no definite acute displaced talar fracture. 3. Diffusely decreased bone density. Electronically Signed   By: Iven Finn M.D.   On: 10/11/2021 20:57   DG Ankle Right Port  Result Date: 10/11/2021 CLINICAL DATA:  Post reduction. EXAM: PORTABLE RIGHT ANKLE - 2 VIEW COMPARISON:  10/11/2021. FINDINGS: Examination is limited due to overlying casting material. There is diffusely decreased mineralization of the bones. There is redemonstration of an oblique fracture of the distal fibular shaft and medial malleolus and possible fracture  of the posterior tibia. Alignment is unchanged and there is no dislocation. A moderate calcaneal spurs present. Soft tissue swelling is noted about the ankle. IMPRESSION: Fractures of the distal fibula and medial malleolus, and possible fracture of the posterior tibia. Alignment is unchanged. Electronically Signed   By: Brett Fairy M.D.   On: 10/11/2021 23:47    Procedures .Critical Care Performed by: Jeanell Sparrow, DO Authorized by: Jeanell Sparrow, DO   Critical care provider statement:    Critical care time (minutes):  48   Critical care time was exclusive of:  Separately billable procedures and treating other patients   Critical care was necessary to treat or prevent imminent or life-threatening deterioration of the following conditions: fracture, sedation, reduction.   Critical care was time spent personally by me on the following activities:  Ordering and performing treatments and interventions, ordering  and review of laboratory studies, ordering and review of radiographic studies, pulse oximetry, re-evaluation of patient's condition, review of old charts, obtaining history from patient or surrogate, examination of patient, evaluation of patient's response to treatment and development of treatment plan with patient or surrogate   Care discussed with: admitting provider   .Sedation  Date/Time: 10/12/2021 12:33 AM Performed by: Jeanell Sparrow, DO Authorized by: Jeanell Sparrow, DO   Consent:    Consent obtained:  Written   Consent given by:  Patient   Risks discussed:  Allergic reaction, prolonged hypoxia resulting in organ damage, prolonged sedation necessitating reversal and inadequate sedation   Alternatives discussed:  Analgesia without sedation and anxiolysis Universal protocol:    Procedure explained and questions answered to patient or proxy's satisfaction: yes     Relevant documents present and verified: yes     Test results available: yes     Imaging studies available: yes      Required blood products, implants, devices, and special equipment available: yes     Site/side marked: yes     Immediately prior to procedure, a time out was called: yes     Patient identity confirmed:  Provided demographic data, verbally with patient and arm band Indications:    Procedure performed:  Fracture reduction   Procedure necessitating sedation performed by:  Physician performing sedation Pre-sedation assessment:    Time since last food or drink:  5 hours   ASA classification: class 2 - patient with mild systemic disease     Mouth opening:  2 finger widths   Thyromental distance:  3 finger widths   Mallampati score:  II - soft palate, uvula, fauces visible   Neck mobility: normal     Pre-sedation assessments completed and reviewed: pre-procedure airway patency not reviewed, pre-procedure cardiovascular function not reviewed, pre-procedure hydration status not reviewed, pre-procedure mental status not reviewed, pre-procedure nausea and vomiting status not reviewed, pre-procedure pain level not reviewed, pre-procedure respiratory function not reviewed and pre-procedure temperature not reviewed     Pre-sedation assessment completed:  10/11/2021 10:50 PM Immediate pre-procedure details:    Reassessment: Patient reassessed immediately prior to procedure     Reviewed: vital signs, relevant labs/tests and NPO status     Verified: bag valve mask available, emergency equipment available, intubation equipment available, IV patency confirmed, oxygen available and reversal medications available   Procedure details (see MAR for exact dosages):    Preoxygenation:  Nasal cannula   Sedation:  Etomidate   Intended level of sedation: deep   Analgesia:  Fentanyl   Intra-procedure monitoring:  Blood pressure monitoring, cardiac monitor, continuous pulse oximetry, continuous capnometry, frequent LOC assessments and frequent vital sign checks   Intra-procedure events: none     Total Provider  sedation time (minutes):  38 Post-procedure details:    Post-sedation assessment completed:  10/11/2021 11:55 PM   Attendance: Constant attendance by certified staff until patient recovered     Recovery: Patient returned to pre-procedure baseline     Post-sedation assessments completed and reviewed: post-procedure airway patency not reviewed, post-procedure cardiovascular function not reviewed, post-procedure hydration status not reviewed, post-procedure mental status not reviewed, post-procedure nausea and vomiting status not reviewed, pain score not reviewed and post-procedure respiratory function not reviewed     Patient is stable for discharge or admission: yes     Procedure completion:  Tolerated well, no immediate complications Reduction of fracture  Date/Time: 10/12/2021 12:35 AM Performed by: Jeanell Sparrow, DO Authorized by: Wynona Dove  A, DO  Consent: Written consent obtained. Risks and benefits: risks, benefits and alternatives were discussed Consent given by: patient Patient understanding: patient states understanding of the procedure being performed Patient consent: the patient's understanding of the procedure matches consent given Procedure consent: procedure consent matches procedure scheduled Relevant documents: relevant documents present and verified Test results: test results available and properly labeled Imaging studies: imaging studies available Required items: required blood products, implants, devices, and special equipment available Patient identity confirmed: verbally with patient and arm band Time out: Immediately prior to procedure a "time out" was called to verify the correct patient, procedure, equipment, support staff and site/side marked as required. Local anesthesia used: no  Anesthesia: Local anesthesia used: no  Sedation: Patient sedated: yes Sedation type: moderate (conscious) sedation Sedatives: etomidate Analgesia: fentanyl Sedation start  date/time: 10/11/2021 11:00 PM Sedation end date/time: 10/11/2021 11:40 PM Vitals: Vital signs were monitored during sedation.  Patient tolerance: patient tolerated the procedure well with no immediate complications     Medications Ordered in ED Medications  atropine 1 MG/10ML injection (has no administration in time range)  sodium chloride 0.9 % bolus 1,000 mL (0 mLs Intravenous Stopped 10/11/21 2252)  fentaNYL (SUBLIMAZE) injection 50 mcg (50 mcg Intravenous Given 10/11/21 2305)  etomidate (AMIDATE) injection 8 mg (8 mg Intravenous Given 10/11/21 2305)  ondansetron (ZOFRAN) injection 4 mg (4 mg Intravenous Given 10/11/21 2338)  fentaNYL (SUBLIMAZE) injection 25 mcg (25 mcg Intravenous Given 10/11/21 2338)    ED Course  I have reviewed the triage vital signs and the nursing notes.  Pertinent labs & imaging results that were available during my care of the patient were reviewed by me and considered in my medical decision making (see chart for details).    MDM Rules/Calculators/A&P                           CC: ankle pain/fall  This patient complains of above; this involves an extensive number of treatment options and is a complaint that carries with it a high risk of complications and morbidity. Vital signs were reviewed. Serious etiologies considered.  Record review:  Previous records obtained and reviewed   Additional history obtained from family at bedside  Work up as above, notable for:  Labs & imaging results that were available during my care of the patient were reviewed by me and considered in my medical decision making.   I ordered imaging studies which included ankle xr b/l, hip pelvis xr and I independently visualized and interpreted imaging which showed bimalleolar fracture on the right, distal fibula fx on the left.   Management: Will proceed with procedural sedation and reduction of the right ankle. Consent obtained at bedside, see procedure note.    Reassessment:   Reduction without significant improvement to the fracture, the reduction itself was technically very difficult. Neurovascularly intact b/l LE following reduction of right ankle.   Pt with aki, b/l ankle fractures. Will require admission. Pending callback from orthopedics. Admitted to [facility].          This chart was dictated using voice recognition software.  Despite best efforts to proofread,  errors can occur which can change the documentation meaning.  Final Clinical Impression(s) / ED Diagnoses Final diagnoses:  Reduction defect of lower extremity, right  AKI (acute kidney injury) ([place])  Closed fracture of right ankle, initial encounter  Closed fracture of left ankle, initial encounter    Rx / DC Orders ED Discharge  Orders     None        Jeanell Sparrow, DO 10/12/21 0038    Jeanell Sparrow, DO 10/12/21 [phone]

## 2021-10-12 ENCOUNTER — Inpatient Hospital Stay (HOSPITAL_COMMUNITY): Payer: Medicare Other

## 2021-10-12 ENCOUNTER — Encounter (HOSPITAL_COMMUNITY): Payer: Self-pay | Admitting: Internal Medicine

## 2021-10-12 DIAGNOSIS — E669 Obesity, unspecified: Secondary | ICD-10-CM | POA: Diagnosis present

## 2021-10-12 DIAGNOSIS — N179 Acute kidney failure, unspecified: Secondary | ICD-10-CM

## 2021-10-12 DIAGNOSIS — M80062A Age-related osteoporosis with current pathological fracture, left lower leg, initial encounter for fracture: Secondary | ICD-10-CM | POA: Diagnosis present

## 2021-10-12 DIAGNOSIS — I4892 Unspecified atrial flutter: Secondary | ICD-10-CM | POA: Diagnosis not present

## 2021-10-12 DIAGNOSIS — E860 Dehydration: Secondary | ICD-10-CM | POA: Diagnosis not present

## 2021-10-12 DIAGNOSIS — Z6833 Body mass index (BMI) 33.0-33.9, adult: Secondary | ICD-10-CM | POA: Diagnosis not present

## 2021-10-12 DIAGNOSIS — Z20822 Contact with and (suspected) exposure to covid-19: Secondary | ICD-10-CM | POA: Diagnosis not present

## 2021-10-12 DIAGNOSIS — E1140 Type 2 diabetes mellitus with diabetic neuropathy, unspecified: Secondary | ICD-10-CM | POA: Diagnosis not present

## 2021-10-12 DIAGNOSIS — M25571 Pain in right ankle and joints of right foot: Secondary | ICD-10-CM | POA: Diagnosis present

## 2021-10-12 DIAGNOSIS — E875 Hyperkalemia: Secondary | ICD-10-CM | POA: Diagnosis not present

## 2021-10-12 DIAGNOSIS — N390 Urinary tract infection, site not specified: Secondary | ICD-10-CM | POA: Diagnosis not present

## 2021-10-12 DIAGNOSIS — E039 Hypothyroidism, unspecified: Secondary | ICD-10-CM | POA: Diagnosis present

## 2021-10-12 DIAGNOSIS — I480 Paroxysmal atrial fibrillation: Secondary | ICD-10-CM | POA: Diagnosis not present

## 2021-10-12 DIAGNOSIS — I110 Hypertensive heart disease with heart failure: Secondary | ICD-10-CM | POA: Diagnosis present

## 2021-10-12 DIAGNOSIS — E861 Hypovolemia: Secondary | ICD-10-CM | POA: Diagnosis not present

## 2021-10-12 DIAGNOSIS — E871 Hypo-osmolality and hyponatremia: Secondary | ICD-10-CM | POA: Diagnosis not present

## 2021-10-12 DIAGNOSIS — I5032 Chronic diastolic (congestive) heart failure: Secondary | ICD-10-CM | POA: Diagnosis not present

## 2021-10-12 DIAGNOSIS — S82899A Other fracture of unspecified lower leg, initial encounter for closed fracture: Secondary | ICD-10-CM | POA: Diagnosis present

## 2021-10-12 DIAGNOSIS — D649 Anemia, unspecified: Secondary | ICD-10-CM | POA: Diagnosis not present

## 2021-10-12 DIAGNOSIS — S82431A Displaced oblique fracture of shaft of right fibula, initial encounter for closed fracture: Secondary | ICD-10-CM | POA: Diagnosis present

## 2021-10-12 DIAGNOSIS — S82841A Displaced bimalleolar fracture of right lower leg, initial encounter for closed fracture: Secondary | ICD-10-CM | POA: Diagnosis not present

## 2021-10-12 DIAGNOSIS — G2581 Restless legs syndrome: Secondary | ICD-10-CM | POA: Diagnosis present

## 2021-10-12 DIAGNOSIS — E1151 Type 2 diabetes mellitus with diabetic peripheral angiopathy without gangrene: Secondary | ICD-10-CM | POA: Diagnosis not present

## 2021-10-12 DIAGNOSIS — W010XXA Fall on same level from slipping, tripping and stumbling without subsequent striking against object, initial encounter: Secondary | ICD-10-CM | POA: Diagnosis present

## 2021-10-12 DIAGNOSIS — E785 Hyperlipidemia, unspecified: Secondary | ICD-10-CM | POA: Diagnosis present

## 2021-10-12 DIAGNOSIS — S82899D Other fracture of unspecified lower leg, subsequent encounter for closed fracture with routine healing: Secondary | ICD-10-CM | POA: Diagnosis not present

## 2021-10-12 DIAGNOSIS — S82891A Other fracture of right lower leg, initial encounter for closed fracture: Secondary | ICD-10-CM | POA: Diagnosis not present

## 2021-10-12 DIAGNOSIS — I10 Essential (primary) hypertension: Secondary | ICD-10-CM | POA: Diagnosis not present

## 2021-10-12 DIAGNOSIS — S82892A Other fracture of left lower leg, initial encounter for closed fracture: Secondary | ICD-10-CM | POA: Diagnosis not present

## 2021-10-12 DIAGNOSIS — Y92481 Parking lot as the place of occurrence of the external cause: Secondary | ICD-10-CM | POA: Diagnosis not present

## 2021-10-12 DIAGNOSIS — B961 Klebsiella pneumoniae [K. pneumoniae] as the cause of diseases classified elsewhere: Secondary | ICD-10-CM | POA: Diagnosis not present

## 2021-10-12 DIAGNOSIS — I272 Pulmonary hypertension, unspecified: Secondary | ICD-10-CM | POA: Diagnosis not present

## 2021-10-12 LAB — FERRITIN: Ferritin: 48 ng/mL (ref 11–307)

## 2021-10-12 LAB — CBC
HCT: 42.3 % (ref 36.0–46.0)
Hemoglobin: 13.7 g/dL (ref 12.0–15.0)
MCH: 31.6 pg (ref 26.0–34.0)
MCHC: 32.4 g/dL (ref 30.0–36.0)
MCV: 97.5 fL (ref 80.0–100.0)
Platelets: 175 10*3/uL (ref 150–400)
RBC: 4.34 MIL/uL (ref 3.87–5.11)
RDW: 13.9 % (ref 11.5–15.5)
WBC: 13.5 10*3/uL — ABNORMAL HIGH (ref 4.0–10.5)
nRBC: 0 % (ref 0.0–0.2)

## 2021-10-12 LAB — BASIC METABOLIC PANEL
Anion gap: 6 (ref 5–15)
BUN: 35 mg/dL — ABNORMAL HIGH (ref 8–23)
CO2: 25 mmol/L (ref 22–32)
Calcium: 8.7 mg/dL — ABNORMAL LOW (ref 8.9–10.3)
Chloride: 109 mmol/L (ref 98–111)
Creatinine, Ser: 1.45 mg/dL — ABNORMAL HIGH (ref 0.44–1.00)
GFR, Estimated: 35 mL/min — ABNORMAL LOW (ref >60)
Glucose, Bld: 135 mg/dL — ABNORMAL HIGH (ref 70–99)
Potassium: 4.9 mmol/L (ref 3.5–5.1)
Sodium: 140 mmol/L (ref 135–145)

## 2021-10-12 LAB — IRON AND TIBC
Iron: 31 ug/dL (ref 28–170)
Saturation Ratios: 10 % — ABNORMAL LOW (ref 10.4–31.8)
TIBC: 311 ug/dL (ref 250–450)
UIBC: 280 ug/dL

## 2021-10-12 LAB — VITAMIN B12: Vitamin B-12: 1135 pg/mL — ABNORMAL HIGH (ref 180–914)

## 2021-10-12 LAB — CBG MONITORING, ED
Glucose-Capillary: 126 mg/dL — ABNORMAL HIGH (ref 70–99)
Glucose-Capillary: 143 mg/dL — ABNORMAL HIGH (ref 70–99)
Glucose-Capillary: 214 mg/dL — ABNORMAL HIGH (ref 70–99)

## 2021-10-12 LAB — GLUCOSE, CAPILLARY
Glucose-Capillary: 148 mg/dL — ABNORMAL HIGH (ref 70–99)
Glucose-Capillary: 164 mg/dL — ABNORMAL HIGH (ref 70–99)

## 2021-10-12 LAB — RETICULOCYTES
Immature Retic Fract: 13.8 % (ref 2.3–15.9)
RBC.: 4.33 MIL/uL (ref 3.87–5.11)
Retic Count, Absolute: 81.4 10*3/uL (ref 19.0–186.0)
Retic Ct Pct: 1.9 % (ref 0.4–3.1)

## 2021-10-12 LAB — HEMOGLOBIN A1C
Hgb A1c MFr Bld: 6.5 % — ABNORMAL HIGH (ref 4.8–5.6)
Mean Plasma Glucose: 139.85 mg/dL

## 2021-10-12 LAB — VITAMIN D 25 HYDROXY (VIT D DEFICIENCY, FRACTURES): Vit D, 25-Hydroxy: 37.6 ng/mL (ref 30–100)

## 2021-10-12 LAB — SODIUM, URINE, RANDOM: Sodium, Ur: 39 mmol/L

## 2021-10-12 LAB — FOLATE: Folate: 6.7 ng/mL (ref >5.9)

## 2021-10-12 LAB — CREATININE, URINE, RANDOM: Creatinine, Urine: 231.92 mg/dL

## 2021-10-12 MED ORDER — MORPHINE SULFATE (PF) 2 MG/ML IV SOLN
1.0000 mg | INTRAVENOUS | Status: DC | PRN
  Administered 2021-10-12 – 2021-10-20 (×5): 1 mg via INTRAVENOUS
  Filled 2021-10-12 (×6): qty 1

## 2021-10-12 MED ORDER — INSULIN ASPART 100 UNIT/ML IJ SOLN
0.0000 [IU] | Freq: Three times a day (TID) | INTRAMUSCULAR | Status: DC
Start: 2021-10-12 — End: 2021-10-23
  Administered 2021-10-12: 5 [IU] via SUBCUTANEOUS
  Administered 2021-10-12 – 2021-10-13 (×2): 2 [IU] via SUBCUTANEOUS
  Administered 2021-10-13: 3 [IU] via SUBCUTANEOUS
  Administered 2021-10-13: 2 [IU] via SUBCUTANEOUS
  Administered 2021-10-14 (×3): 3 [IU] via SUBCUTANEOUS
  Administered 2021-10-15: 5 [IU] via SUBCUTANEOUS
  Administered 2021-10-15: 3 [IU] via SUBCUTANEOUS
  Administered 2021-10-15: 8 [IU] via SUBCUTANEOUS
  Administered 2021-10-16: 3 [IU] via SUBCUTANEOUS
  Administered 2021-10-16 – 2021-10-17 (×4): 5 [IU] via SUBCUTANEOUS
  Administered 2021-10-17 – 2021-10-18 (×3): 3 [IU] via SUBCUTANEOUS
  Administered 2021-10-18: 8 [IU] via SUBCUTANEOUS
  Administered 2021-10-19 (×2): 5 [IU] via SUBCUTANEOUS
  Administered 2021-10-20: 8 [IU] via SUBCUTANEOUS
  Administered 2021-10-20: 3 [IU] via SUBCUTANEOUS
  Administered 2021-10-20: 5 [IU] via SUBCUTANEOUS
  Administered 2021-10-21: 11 [IU] via SUBCUTANEOUS
  Administered 2021-10-21: 2 [IU] via SUBCUTANEOUS
  Administered 2021-10-21 – 2021-10-22 (×3): 5 [IU] via SUBCUTANEOUS
  Administered 2021-10-23 (×2): 3 [IU] via SUBCUTANEOUS
  Filled 2021-10-12: qty 0.15

## 2021-10-12 MED ORDER — ISOSORBIDE MONONITRATE ER 30 MG PO TB24
30.0000 mg | ORAL_TABLET | Freq: Every day | ORAL | Status: DC
  Administered 2021-10-12 – 2021-10-13 (×2): 30 mg via ORAL
  Filled 2021-10-12 (×2): qty 1

## 2021-10-12 MED ORDER — METOPROLOL SUCCINATE ER 25 MG PO TB24
25.0000 mg | ORAL_TABLET | Freq: Every day | ORAL | Status: DC
  Administered 2021-10-12 – 2021-10-23 (×10): 25 mg via ORAL
  Filled 2021-10-12 (×12): qty 1

## 2021-10-12 MED ORDER — METHOCARBAMOL 500 MG PO TABS
500.0000 mg | ORAL_TABLET | Freq: Four times a day (QID) | ORAL | Status: DC | PRN
  Administered 2021-10-12 – 2021-10-17 (×4): 500 mg via ORAL
  Filled 2021-10-12 (×4): qty 1

## 2021-10-12 MED ORDER — INSULIN GLARGINE 100 UNIT/ML SOLOSTAR PEN: 10.0000 [IU] | PEN_INJECTOR | Freq: Every day | SUBCUTANEOUS | Status: DC

## 2021-10-12 MED ORDER — AMLODIPINE BESYLATE 5 MG PO TABS
5.0000 mg | ORAL_TABLET | Freq: Every day | ORAL | Status: DC
  Administered 2021-10-12 – 2021-10-21 (×9): 5 mg via ORAL
  Filled 2021-10-12 (×10): qty 1

## 2021-10-12 MED ORDER — SODIUM CHLORIDE 0.9 % IV SOLN: INTRAVENOUS | Status: DC

## 2021-10-12 MED ORDER — FENTANYL CITRATE PF 50 MCG/ML IJ SOSY
50.0000 ug | PREFILLED_SYRINGE | Freq: Once | INTRAMUSCULAR | Status: AC
  Administered 2021-10-12: 50 ug via INTRAVENOUS
  Filled 2021-10-12: qty 1

## 2021-10-12 MED ORDER — INSULIN ASPART 100 UNIT/ML IJ SOLN
0.0000 [IU] | Freq: Every day | INTRAMUSCULAR | Status: DC
  Administered 2021-10-14 – 2021-10-22 (×4): 2 [IU] via SUBCUTANEOUS
  Filled 2021-10-12: qty 0.05

## 2021-10-12 MED ORDER — CARBIDOPA-LEVODOPA 25-100 MG PO TABS
1.5000 | ORAL_TABLET | Freq: Every day | ORAL | Status: DC
  Administered 2021-10-12 – 2021-10-22 (×11): 1.5 via ORAL
  Filled 2021-10-12: qty 1.5
  Filled 2021-10-12: qty 2
  Filled 2021-10-12 (×5): qty 1.5
  Filled 2021-10-12 (×8): qty 2

## 2021-10-12 MED ORDER — INSULIN GLARGINE-YFGN 100 UNIT/ML ~~LOC~~ SOLN
10.0000 [IU] | Freq: Every day | SUBCUTANEOUS | Status: DC
  Administered 2021-10-12 – 2021-10-17 (×6): 10 [IU] via SUBCUTANEOUS
  Filled 2021-10-12 (×6): qty 0.1

## 2021-10-12 MED ORDER — INSULIN ASPART 100 UNIT/ML IJ SOLN
0.0000 [IU] | INTRAMUSCULAR | Status: DC
Start: 2021-10-12 — End: 2021-10-12
  Filled 2021-10-12: qty 0.09

## 2021-10-12 MED ORDER — HYDRALAZINE HCL 25 MG PO TABS: 25.0000 mg | ORAL_TABLET | Freq: Four times a day (QID) | ORAL | Status: DC | PRN

## 2021-10-12 MED ORDER — HYDRALAZINE HCL 25 MG PO TABS
25.0000 mg | ORAL_TABLET | Freq: Two times a day (BID) | ORAL | Status: DC
  Administered 2021-10-12 – 2021-10-13 (×4): 25 mg via ORAL
  Filled 2021-10-12 (×4): qty 1

## 2021-10-12 MED ORDER — NYSTATIN 100000 UNIT/GM EX POWD
Freq: Three times a day (TID) | CUTANEOUS | Status: DC
  Administered 2021-10-12: 1 via TOPICAL
  Filled 2021-10-12 (×3): qty 15

## 2021-10-12 MED ORDER — PREGABALIN 50 MG PO CAPS
50.0000 mg | ORAL_CAPSULE | Freq: Two times a day (BID) | ORAL | Status: DC
  Administered 2021-10-12 – 2021-10-23 (×23): 50 mg via ORAL
  Filled 2021-10-12 (×3): qty 1
  Filled 2021-10-12 (×3): qty 2
  Filled 2021-10-12 (×2): qty 1
  Filled 2021-10-12 (×2): qty 2
  Filled 2021-10-12 (×3): qty 1
  Filled 2021-10-12: qty 2
  Filled 2021-10-12: qty 1
  Filled 2021-10-12: qty 2
  Filled 2021-10-12 (×5): qty 1
  Filled 2021-10-12: qty 2
  Filled 2021-10-12: qty 1

## 2021-10-12 MED ORDER — OXYCODONE HCL 5 MG PO TABS
5.0000 mg | ORAL_TABLET | ORAL | Status: DC | PRN
  Administered 2021-10-12 – 2021-10-14 (×6): 5 mg via ORAL
  Filled 2021-10-12 (×6): qty 1

## 2021-10-12 MED ORDER — LEVOTHYROXINE SODIUM 100 MCG PO TABS
100.0000 ug | ORAL_TABLET | Freq: Every day | ORAL | Status: DC
  Administered 2021-10-12 – 2021-10-23 (×12): 100 ug via ORAL
  Filled 2021-10-12 (×13): qty 1

## 2021-10-12 NOTE — ED Notes (Signed)
Attempted to call report, RN is off floor and will return call.

## 2021-10-12 NOTE — Progress Notes (Signed)
PROGRESS NOTE    Sierra Wright  [id number] DOB: 06-13-1934 DOA: 10/11/2021 PCP: [name], MD    Brief Narrative:  Sierra Wright is an 87 year old female with past medical history significant for CAD s/p CABG, chronic diastolic congestive heart failure, type 2 diabetes mellitus, CVA, essential hypertension, hypothyroidism, depression, RLS, osteoarthritis, who presented to [facility] ED on 11/9 from her PCPs office following mechanical fall with acute pain to bilateral ankles.  Patient reports that she was getting out of her car to get in her wheelchair, her legs gave out, ankles twisted and she fell onto her buttock.  Patient denies head injury or loss of consciousness.  Reporting severe pain in both of her ankles since the fall.  In the ED, temperature 97.6 F, HR 53, RR 17, BP 153/78, SPO2 100% on 2 L nasal cannula.  Sodium 138, potassium 4.9, chloride 108, CO2 25, BUN 38, creatinine 2.21, glucose 208.  AST 14, ALT 8, total bilirubin 0.4.  WBC 10.8, hemoglobin 10.4, platelets 152.  Influenza A/B PCR negative.  COVID-19 PCR negative.  Pelvis x-ray with no definite acute displaced fracture, no hip dislocation or pelvic diastases.  Right ankle x-ray with bimalleolar acute displaced fracture, cortical irregularity medial talus, diffusely decreased bone density.  Left ankle x-ray with indeterminate nondisplaced distal fibular fracture with lateral ankle subcutaneous soft tissue edema.  EDP attempted manual reduction right ankle but was unsuccessful.  Orthopedics was consulted.  Hospital service consulted for further evaluation and management of bilateral ankle fracture.   Assessment & Plan:   Principal Problem:   Ankle fracture Active Problems:   Essential hypertension   RLS (restless legs syndrome)   AKI (acute kidney injury) (HCC)   Anemia   Bilateral ankle fractures Patient presenting to ED from PCP office following mechanical fall.  Imaging notable for left ankle with  age-indeterminate nondisplaced distal fibular fracture and right ankle with bimalleolar acute displaced fracture. --Orthopedics, Dr. Erlinda Hong following, appreciate assistance --Holding home aspirin/Plavix --Check vitamin D 25-hydroxy --Oxycodone 5 mg p.o. q4h PRN moderate pain --Morphine 1 mg IV q4h PRN severe pain --Robaxin 5 mg p.o. q6h PRN muscle spasms --Orthopedics plans operative management sometime early next week to allow washout of aspirin and Plavix.  Acute renal failure Creatinine on admission elevated 2.21, etiology likely secondary to prerenal from poor oral intake/dehydration Baseline creatinine 1.2.   --Cr 2.21>1.45 --Continue IVF with NS at 75 mL/h --Continue to hold home benazepril and furosemide --Avoid nephrotoxins, renal dose all medications --BMP daily  Normocytic anemia Hemoglobin 10.4 with MCV 99.1 admission.  Hemoglobin was 13.6 in February 2021.  Denies any dark stools or GI bleeding.  Anemia panel with iron 31, TIBC 311, folate 6.7, B12 1135. --CBC daily  CAD s/p CABG --Holding aspirin/Plavix for planned operative management as above  Chronic diastolic congestive heart failure, compensated --Holding home furosemide secondary to volume depletion/renal failure as above --Metoprolol 25 mg p.o. daily --Holding home benazepril for renal failure as above --Strict I's and O's, daily weights  Essential hypertension --Metoprolol succinate 25 mg p.o. daily --Amlodipine 5 mg p.o. daily --Hydralazine 25 mg p.o. twice daily --Isosorbide mononitrate 30 mg p.o. daily  Type 2 diabetes mellitus Hemoglobin A1c 6.5, well controlled.  Home regimen includes glimepiride 1 mg p.o. daily, Lantus 26 units subcutaneously daily. --Semglee 10 units [place] daily --moderate SSI for coverage --CBGs qAC/HS  Hypothyroidism --Levothyroxine 100 mcg p.o. daily  Hx CVA --Holding aspirin and Plavix as above.  Has allergy to statin.  RLS:  Continue Lyrica and Sinemet   DVT prophylaxis:  SCDs Start: 10/12/21 0412   Code Status: Full Code Family Communication: Updated patient's spouse, Herbie Baltimore via telephone this morning.  Disposition Plan:  Level of care: Telemetry Cardiac Status is: Inpatient  Remains inpatient appropriate because: Pending operative management of bilateral ankle fractures, pending transfer to [facility].   Consultants:  Orthopedics, Dr. Erlinda Hong  Procedures:  None  Antimicrobials:  None   Subjective: Patient seen examined at bedside, remains in ED holding area.  Awaiting transfer to [facility] for planned orthopedic operative management of bilateral ankle fractures.  Patient reports pain, "give me some medicines to knock me out".  Discussed with orthopedics this morning, Dr. Erlinda Hong; given recent use of aspirin/Plavix will need 5-day washout prior to operative management and requesting patient be moved to [facility] soon.  Patient with no other questions or concerns at this time.  No family present at bedside.  Patient denies headache, no fever/chills/night sweats, no nausea/vomiting/diarrhea, no chest pain, no palpitations, no shortness of breath, no abdominal pain.  No acute events overnight per nursing staff.  Objective: Vitals:   10/12/21 0915 10/12/21 0930 10/12/21 0945 10/12/21 1000  BP:      Pulse: 67 71 68 69  Resp: 13 18 20 20   Temp:      TempSrc:      SpO2: 96% 99% 94% 100%  Weight:      Height:        Intake/Output Summary (Last 24 hours) at 10/12/2021 1029 Last data filed at 10/11/2021 2252 Gross per 24 hour  Intake 1300 ml  Output --  Net 1300 ml   Filed Weights   10/11/21 1805  Weight: 95.3 kg    Examination:  General exam: Appears calm and comfortable  Respiratory system: Clear to auscultation. Respiratory effort normal.  On room air Cardiovascular system: S1 & S2 heard, RRR. No JVD, murmurs, rubs, gallops or clicks. No pedal edema. Gastrointestinal system: Abdomen is nondistended, soft and nontender. No organomegaly or masses felt.  Normal bowel sounds heard. Central nervous system: Alert and oriented. No focal neurological deficits. Extremities: Bilateral lower extremities/ankles with splint in place, otherwise moves all extremities independently, neurovascular intact Skin: No rashes, lesions or ulcers Psychiatry: Judgement and insight appear normal. Mood & affect appropriate.     Data Reviewed: I have personally reviewed following labs and imaging studies  CBC: Recent Labs  Lab 10/11/21 1815 10/12/21 0505  WBC 10.8* 13.5*  NEUTROABS 8.3*  --   HGB 10.4* 13.7  HCT 32.4* 42.3  MCV 99.1 97.5  PLT 152 0000000   Basic Metabolic Panel: Recent Labs  Lab 10/11/21 1815 10/12/21 0505  NA 138 140  K 4.9 4.9  CL 108 109  CO2 25 25  GLUCOSE 208* 135*  BUN 38* 35*  CREATININE 2.21* 1.45*  CALCIUM 8.3* 8.7*   GFR: Estimated Creatinine Clearance: 33 mL/min (A) (by C-G formula based on SCr of 1.45 mg/dL (H)). Liver Function Tests: Recent Labs  Lab 10/11/21 1815  AST 14*  ALT 8  ALKPHOS 81  BILITOT 0.4  PROT 5.8*  ALBUMIN 3.4*   No results for input(s): LIPASE, AMYLASE in the last 168 hours. No results for input(s): AMMONIA in the last 168 hours. Coagulation Profile: No results for input(s): INR, PROTIME in the last 168 hours. Cardiac Enzymes: No results for input(s): CKTOTAL, CKMB, CKMBINDEX, TROPONINI in the last 168 hours. BNP (last 3 results) No results for input(s): PROBNP in the last 8760 hours.  HbA1C: Recent Labs    10/12/21 0413  HGBA1C 6.5*   CBG: Recent Labs  Lab 10/12/21 0528 10/12/21 1007  GLUCAP 126* 143*   Lipid Profile: No results for input(s): CHOL, HDL, LDLCALC, TRIG, CHOLHDL, LDLDIRECT in the last 72 hours. Thyroid Function Tests: No results for input(s): TSH, T4TOTAL, FREET4, T3FREE, THYROIDAB in the last 72 hours. Anemia Panel: Recent Labs    10/12/21 0441  VITAMINB12 1,135*  FOLATE 6.7  FERRITIN 48  TIBC 311  IRON 31  RETICCTPCT 1.9   Sepsis Labs: No results  for input(s): PROCALCITON, LATICACIDVEN in the last 168 hours.  Recent Results (from the past 240 hour(s))  Resp Panel by RT-PCR (Flu A&B, Covid) Nasopharyngeal Swab     Status: None   Collection Time: 10/11/21 10:19 PM   Specimen: Nasopharyngeal Swab; Nasopharyngeal(NP) swabs in vial transport medium  Result Value Ref Range Status   SARS Coronavirus 2 by RT PCR NEGATIVE NEGATIVE Final    Comment: (NOTE) SARS-CoV-2 target nucleic acids are NOT DETECTED.  The SARS-CoV-2 RNA is generally detectable in upper respiratory specimens during the acute phase of infection. The lowest concentration of SARS-CoV-2 viral copies this assay can detect is 138 copies/mL. A negative result does not preclude SARS-Cov-2 infection and should not be used as the sole basis for treatment or other patient management decisions. A negative result may occur with  improper specimen collection/handling, submission of specimen other than nasopharyngeal swab, presence of viral mutation(s) within the areas targeted by this assay, and inadequate number of viral copies(<138 copies/mL). A negative result must be combined with clinical observations, patient history, and epidemiological information. The expected result is Negative.  Fact Sheet for Patients:  EntrepreneurPulse.com.au  Fact Sheet for Healthcare Providers:  IncredibleEmployment.be  This test is no t yet approved or cleared by the [place] FDA and  has been authorized for detection and/or diagnosis of SARS-CoV-2 by FDA under an Emergency Use Authorization (EUA). This EUA will remain  in effect (meaning this test can be used) for the duration of the COVID-19 declaration under Section 564(b)(1) of the Act, 21 U.S.C.section 360bbb-3(b)(1), unless the authorization is terminated  or revoked sooner.       Influenza A by PCR NEGATIVE NEGATIVE Final   Influenza B by PCR NEGATIVE NEGATIVE Final    Comment: (NOTE) The  Xpert Xpress SARS-CoV-2/FLU/RSV plus assay is intended as an aid in the diagnosis of influenza from Nasopharyngeal swab specimens and should not be used as a sole basis for treatment. Nasal washings and aspirates are unacceptable for Xpert Xpress SARS-CoV-2/FLU/RSV testing.  Fact Sheet for Patients: EntrepreneurPulse.com.au  Fact Sheet for Healthcare Providers: IncredibleEmployment.be  This test is not yet approved or cleared by the [place] FDA and has been authorized for detection and/or diagnosis of SARS-CoV-2 by FDA under an Emergency Use Authorization (EUA). This EUA will remain in effect (meaning this test can be used) for the duration of the COVID-19 declaration under Section 564(b)(1) of the Act, 21 U.S.C. section 360bbb-3(b)(1), unless the authorization is terminated or revoked.  Performed at [facility], [place] [address] [place], [place] [address]          Radiology Studies: DG Pelvis 1-2 Views  Result Date: 10/11/2021 CLINICAL DATA:  Status post fall.  Pain EXAM: PELVIS - 1-2 VIEW COMPARISON:  None. FINDINGS: Markedly limited evaluation due to overlapping osseous structures and overlying soft tissues. There is no evidence of pelvic fracture or diastasis. No pelvic bone lesions are seen.  Severe degenerative changes left hip. Degenerative changes of the sacroiliac joints. No hip dislocation. Vascular calcification. IMPRESSION: 1. Markedly limited evaluation due to overlapping osseous structures and overlying soft tissues. 2. No definite acute displaced fracture, hip dislocation, or pelvic diastasis. Electronically Signed   By: Tish Frederickson M.D.   On: 10/11/2021 21:00   DG Ankle Complete Left  Addendum Date: 10/11/2021   ADDENDUM REPORT: 10/11/2021 20:56 ADDENDUM: Findings should state no evidence of "severe" arthropathy or other focal bone abnormality. Findings should also mention diffuse decreased bone  density. Electronically Signed   By: Tish Frederickson M.D.   On: 10/11/2021 20:56   Result Date: 10/11/2021 CLINICAL DATA:  fall pain EXAM: LEFT ANKLE COMPLETE - 3+ VIEW COMPARISON:  X-ray left tibia fibula 03/02/2005 FINDINGS: Age-indeterminate nondisplaced distal fibular fracture. There is no evidence of definite acute displaced fracture, dislocation, or joint effusion. Degenerative changes of the navicular noted. Plantar calcaneal spur. There is no evidence of arthropathy or other focal bone abnormality. Mild lateral ankle subcutaneus soft tissue edema. Vascular calcifications. IMPRESSION: indeterminate nondisplaced distal fibular fracture. Associated lateral ankle subcutaneus soft tissue edema. Correlate with point tenderness to evaluate for acuity. Electronically Signed: By: Tish Frederickson M.D. On: 10/11/2021 20:53   DG Ankle Complete Right  Result Date: 10/11/2021 CLINICAL DATA:  Status fall EXAM: RIGHT ANKLE - COMPLETE 3+ VIEW COMPARISON:  None. FINDINGS: Diffusely decreased bone density. Acute inferiorly displaced medial malleolar fracture. Acute displaced distal fibular fracture. No definite posterior malleolar fracture. Vague cortical irregularity the medial talus with no definite acute displaced talar fracture. Degenerative changes of the midfoot. Plantar calcaneal spur. There is no evidence of severe arthropathy or other focal bone abnormality. Subcutaneus soft tissue edema of the ankle. Vascular clips overlie the soft tissues of the right distal leg. Vascular calcifications. IMPRESSION: 1. Bimalleolar acute displaced fracture. 2. Vague cortical irregularity the medial talus with no definite acute displaced talar fracture. 3. Diffusely decreased bone density. Electronically Signed   By: Tish Frederickson M.D.   On: 10/11/2021 20:57   CT ANKLE RIGHT WO CONTRAST  Result Date: 10/12/2021 CLINICAL DATA:  Fracture, ankle EXAM: CT OF THE RIGHT ANKLE WITHOUT CONTRAST TECHNIQUE: Multidetector CT imaging  of the right ankle was performed according to the standard protocol. Multiplanar CT image reconstructions were also generated. COMPARISON:  Ankle radiograph 10/11/2021 FINDINGS: Bones/Joint/Cartilage There is a trimalleolar ankle fracture. Oblique distal fibular fracture has minimal 2 mm posterolateral displacement. Medial malleolar fracture has minimal 1-2 mm placement. Posterior malleolar fracture has proximally 1 mm posterior displacement and involves approximately 15% of the articular surface. Tiny fracture of the anterolateral distal tibia consistent with anterior inferior tibiofibular ligament avulsion (series 6, image 42). There is a splint in place. The ankle mortise appears well aligned. Osteopenia. There is mild posterior subtalar, talonavicular, calcaneocuboid, navicular-cuneiform and diffuse tarsometatarsal joint osteoarthritis. Ligaments Suboptimally assessed by CT. Muscles and Tendons Generalized muscle atrophy.  No evidence of tendon entrapment. Soft tissues Extensive soft tissue swelling. IMPRESSION: Trimalleolar ankle fracture with minimal 1-2 mm displacement. The posterior malleolar fracture involves approximately 15% of the articular surface. Tiny additional fracture of the anterolateral distal tibia consistent with an anterior inferior tibiofibular ligament avulsion. Diffuse osteopenia. Generalized muscle atrophy. Mild hindfoot and midfoot osteoarthritis. Extensive soft tissue swelling. Electronically Signed   By: Caprice Renshaw M.D.   On: 10/12/2021 08:59   DG Ankle Right Port  Result Date: 10/11/2021 CLINICAL DATA:  Post reduction. EXAM: PORTABLE RIGHT ANKLE - 2 VIEW COMPARISON:  10/11/2021.  FINDINGS: Examination is limited due to overlying casting material. There is diffusely decreased mineralization of the bones. There is redemonstration of an oblique fracture of the distal fibular shaft and medial malleolus and possible fracture of the posterior tibia. Alignment is unchanged and there is no  dislocation. A moderate calcaneal spurs present. Soft tissue swelling is noted about the ankle. IMPRESSION: Fractures of the distal fibula and medial malleolus, and possible fracture of the posterior tibia. Alignment is unchanged. Electronically Signed   By: Brett Fairy M.D.   On: 10/11/2021 23:47        Scheduled Meds:  amLODipine  5 mg Oral Daily   atropine       carbidopa-levodopa  1.5 tablet Oral QHS   hydrALAZINE  25 mg Oral BID   insulin aspart  0-15 Units Subcutaneous TID WC   insulin aspart  0-5 Units Subcutaneous QHS   insulin glargine-yfgn  10 Units Subcutaneous Daily   isosorbide mononitrate  30 mg Oral Daily   levothyroxine  100 mcg Oral QAC breakfast   metoprolol succinate  25 mg Oral Daily   pregabalin  50 mg Oral BID   Continuous Infusions:  sodium chloride 75 mL/hr at 10/12/21 0538     LOS: 0 days    Time spent: 46 minutes spent on chart review, discussion with nursing staff, consultants, updating family and interview/physical exam; more than 50% of that time was spent in counseling and/or coordination of care.    Lafayette Dunlevy J [place] DO [facility] Available via Epic secure chat 7am-7pm After these hours, please refer to coverage provider listed on [email or web address] 10/12/2021, 10:29 AM

## 2021-10-12 NOTE — H&P (Signed)
History and Physical    Sierra Wright [id number] DOB: [birth year] DOA: 10/11/2021  PCP: [name], MD Patient coming from: PCPs office  Chief Complaint: Fall  HPI: Sierra Wright is a 87 y.o. female with medical history significant of CAD status post CABG, chronic diastolic CHF, insulin-dependent type 2 diabetes, CVA, hypertension, hypothyroidism, depression, RLS, osteoarthritis of left hip presented to the ED from her PCPs office where she had a mechanical fall and injured her ankles.  No head injury or loss of consciousness reported.  Labs showing WBC 10.8, hemoglobin 10.4, MCV 99.1, platelet count 152k.  Sodium 138, potassium 4.9, chloride 108, bicarb 25, BUN 38, creatinine 2.2 (baseline 1.2), glucose 208.  COVID and influenza PCR negative.  X-ray of left ankle showing age-indeterminate nondisplaced distal fibular fracture.  X-ray of right ankle showing bimalleolar acute displaced fracture.  Pelvic x-ray negative for acute fracture or hip dislocation. Reduction of the right ankle attempted by ED provider but without improvement of the fracture. Patient was given IV analgesics, antiemetic, and 1 L IV fluid bolus.  ED physician consulted orthopedics (Dr. Erlinda Hong) who requested transferring the patient to [facility].  Patient states yesterday at her doctor's office, as she was getting out of her car to get her wheelchair, her legs gave out, ankles twisted and she fell on her buttock.  Denies head injury or loss of consciousness.  She is having severe pain in both of her ankles since then.  Also reports 1 month history of abdominal pain, vomiting, and diarrhea for which she is being evaluated by her primary care physician and states symptoms have been improving. No other complaints.  Denies cough, shortness of breath, or chest pain.  Review of Systems:  All systems reviewed and apart from history of presenting illness, are negative.  Past Medical History:  Diagnosis Date   Arthritis     Coronary artery disease 1999   CABG   Depression    Diabetes mellitus without complication ([place])    type II; metformin and Amaryl   Diabetic neuropathy ([place])    History of anemia as a child    HOH (hard of hearing)    Hypertension    Hypothyroidism    synthroid   PONV (postoperative nausea and vomiting)    Restless leg syndrome    S/P cardiac cath 08/13/18 stable CAD 08/14/2018   Shortness of breath 09/03/12   ECHO- The LA is Moderately Dilated, mild mitral annular calcification. mild pulmonary hypertension, moderate concentric LV hypertrophy. Atrial septum is aneurysmal. Aortic valve appears to be mildly sclerotic.EF 88%   Urinary incontinence    Weakness of both legs     Past Surgical History:  Procedure Laterality Date   ABDOMINAL HYSTERECTOMY     APPENDECTOMY     BACK SURGERY     CARDIAC CATHERIZATION  08/20/03   Widely patent bypass grafts. Normal left ventricular systolic function.   CARDIAC CATHETERIZATION     CHOLECYSTECTOMY     COLONOSCOPY     CORONARY ARTERY BYPASS GRAFT     EYE SURGERY     bilateral cataracts   LEFT HEART CATH AND CORONARY ANGIOGRAPHY N/A 08/13/2018   Procedure: LEFT HEART CATH AND CORONARY ANGIOGRAPHY;  Surgeon: Burnell Blanks, MD;  Location: [facility];  Service: Cardiovascular;  Laterality: N/A;   LUMBAR LAMINECTOMY/DECOMPRESSION MICRODISCECTOMY N/A 07/26/2015   Procedure: BILATERAL Lumbar 3-4 SUBTOTAL HEMILAMINECTOMY WITH LATERAL RECESS DECOMPRESSION;  Surgeon: Jessy Oto, MD;  Location: [facility];  Service: Orthopedics;  Laterality: N/A;   SHOULDER SURGERY     TUBAL LIGATION       reports that she has never smoked. She has never been exposed to tobacco smoke. She has never used smokeless tobacco. She reports that she does not drink alcohol and does not use drugs.  Allergies  Allergen Reactions   Duloxetine Hcl Nausea And Vomiting and Other (See Comments)   Statins Other (See Comments)    Causes myalgias Other reaction(s):  Unknown   Amitriptyline Other (See Comments)    Pt. States that it caused it to be suicidal.    Benadryl [Diphenhydramine Hcl (Sleep)] Other (See Comments)    Makes the patient very "hyper"   Ezetimibe     Other reaction(s): Unknown Other reaction(s): Unknown   Metformin Hcl     Other reaction(s): Unknown Other reaction(s): Unknown   Other     Other reaction(s): Unknown   Sitagliptin     Other reaction(s): Unknown Other reaction(s): Unknown   Tylenol [Acetaminophen] Nausea Only   Colesevelam Hcl Other (See Comments)    Causes dizziness Other reaction(s): Unknown    Family History  Problem Relation Age of Onset   Other Mother    Pneumonia Mother    Heart attack Father    Liver cancer Sister    Stroke Sister    Heart attack Brother 77   Stroke Son 23   Healthy Daughter     Prior to Admission medications   Medication Sig Start Date End Date Taking? Authorizing Provider  amLODipine (NORVASC) 5 MG tablet Take 1 tablet by mouth once daily Patient taking differently: Take 5 mg by mouth daily. 04/11/21  Yes [name], [name], MD  Ascorbic Acid (VITAMIN C) 1000 MG tablet Take 1,000 mg by mouth daily.   Yes [provider]  aspirin EC 81 MG EC tablet Take 1 tablet (81 mg total) by mouth daily. 08/15/18  Yes Isaiah Serge, NP  benazepril (LOTENSIN) 40 MG tablet Take 1 tablet by mouth once daily Patient taking differently: Take 40 mg by mouth daily. 07/24/21  Yes [name], [name], MD  calcium carbonate (OS-CAL - DOSED IN MG OF ELEMENTAL CALCIUM) 1250 (500 Ca) MG tablet Take 1 tablet by mouth daily.   Yes [provider]  carbidopa-levodopa (SINEMET IR) 25-100 MG tablet Take 1.5 tablets by mouth at bedtime. 12/16/20  Yes Patel, Donika K, DO  cholecalciferol (VITAMIN D3) 25 MCG (1000 UNIT) tablet Take 1,000 Units by mouth daily.   Yes [provider]  clopidogrel (PLAVIX) 75 MG tablet Take 1 tablet by mouth once daily Patient taking differently: Take 75 mg by  mouth daily. 07/24/21  Yes [name], [name], MD  diazepam (VALIUM) 10 MG tablet Take 10 mg by mouth daily as needed for anxiety.   Yes [provider]  furosemide (LASIX) 40 MG tablet Take 1 tablet by mouth twice daily Patient taking differently: Take 40 mg by mouth 2 (two) times daily. 07/24/21  Yes Bhagat, Bhavinkumar, PA  Garlic [id number] MG CAPS Take 1,000 mg by mouth daily.    Yes [provider]  glimepiride (AMARYL) 1 MG tablet Take 1 mg by mouth daily with breakfast.   Yes [provider]  hydrALAZINE (APRESOLINE) 25 MG tablet TAKE 1 TABLET BY MOUTH THREE TIMES DAILY Patient taking differently: Take 25 mg by mouth 2 (two) times daily. 11/22/20  Yes [name], [name], MD  isosorbide mononitrate (IMDUR) 30 MG 24 hr tablet Take 1  tablet by mouth once daily Patient taking differently: Take 30 mg by mouth daily. 07/10/21  Yes [name], Deloris Ping, MD  LANTUS SOLOSTAR 100 UNIT/ML Solostar Pen Inject 26 Units into the skin daily. 07/10/21  Yes [provider]  levothyroxine (SYNTHROID, LEVOTHROID) 100 MCG tablet Take 100 mcg by mouth daily before breakfast.   Yes [provider]  metoprolol succinate (TOPROL-XL) 25 MG 24 hr tablet Take 1 tablet by mouth once daily Patient taking differently: Take 25 mg by mouth daily. 06/07/21  Yes [name], Deloris Ping, MD  nitroGLYCERIN (NITROSTAT) 0.4 MG SL tablet DISSOLVE ONE TABLET UNDER THE TONGUE EVERY 5 MINUTES AS NEEDED FOR CHEST PAIN. Patient taking differently: Place 0.4 mg under the tongue every 5 (five) minutes as needed for chest pain. 04/19/21  Yes [name], Deloris Ping, MD  ondansetron (ZOFRAN) 4 MG tablet Take 4 mg by mouth every 6 (six) hours as needed for nausea. 08/20/20  Yes [provider]  oxyCODONE (ROXICODONE) 5 MG immediate release tablet Take 1 tablet (5 mg total) by mouth every 6 (six) hours as needed for severe pain. 10/11/21  Yes Cristie Hem, PA-C  potassium chloride (K-DUR,KLOR-CON) 10 MEQ tablet Take 10  mEq by mouth 2 (two) times daily.   Yes [provider]  pregabalin (LYRICA) 50 MG capsule Take 50 mg by mouth 2 (two) times daily.   Yes [provider]  vitamin B-12 (CYANOCOBALAMIN) 1000 MCG tablet Take 1,000 mcg by mouth daily.   Yes [provider]  BD PEN NEEDLE NANO 2ND GEN 32G X 4 MM MISC daily. use as directed 01/17/21   [provider]  gabapentin (NEURONTIN) 100 MG capsule Take 1 capsule (100 mg total) by mouth 3 (three) times daily. Patient not taking: No sig reported 12/16/20   Nita Sickle K, DO  HYDROcodone-acetaminophen (NORCO) 5-325 MG tablet Take 1 tablet by mouth every 6 (six) hours as needed. Patient not taking: No sig reported 09/27/21   Tarry Kos, MD  Insulin Pen Needle (BD PEN NEEDLE NANO 2ND GEN) 32G X 4 MM MISC See admin instructions.    [provider]  Lancets [facility] DELICA PLUS [place]) MISC Apply topically 2 (two) times daily. 01/17/21   [provider]  Lum Babe test strip SMARTSIG:Strip(s) Via Meter Twice Daily 01/17/21   [provider]    Physical Exam: Vitals:   10/11/21 2335 10/11/21 2340 10/12/21 0140 10/12/21 0426  BP: (!) 186/157 (!) 150/74 138/71 (!) 179/96  Pulse: 63 68 67 66  Resp: 19 17 (!) 21 18  Temp:      TempSrc:      SpO2: 100% 99% 96% 99%  Weight:      Height:        Physical Exam Constitutional:      General: She is not in acute distress. HENT:     Head: Normocephalic and atraumatic.     Mouth/Throat:     Mouth: Mucous membranes are dry.  Eyes:     Extraocular Movements: Extraocular movements intact.     Conjunctiva/sclera: Conjunctivae normal.  Cardiovascular:     Rate and Rhythm: Normal rate and regular rhythm.     Pulses: Normal pulses.  Pulmonary:     Effort: Pulmonary effort is normal. No respiratory distress.     Breath sounds: Normal breath sounds. No wheezing or rales.  Abdominal:     General: Bowel sounds are normal. There is no distension.      Palpations: Abdomen is  soft.     Tenderness: There is no abdominal tenderness. There is no guarding or rebound.  Musculoskeletal:     Cervical back: Normal range of motion and neck supple.     Comments: Bilateral lower extremities in splint  Skin:    General: Skin is warm and dry.  Neurological:     General: No focal deficit present.     Mental Status: She is alert and oriented to person, place, and time.     Labs on Admission: I have personally reviewed following labs and imaging studies  CBC: Recent Labs  Lab 10/11/21 1815  WBC 10.8*  NEUTROABS 8.3*  HGB 10.4*  HCT 32.4*  MCV 99.1  PLT 0000000   Basic Metabolic Panel: Recent Labs  Lab 10/11/21 1815  NA 138  K 4.9  CL 108  CO2 25  GLUCOSE 208*  BUN 38*  CREATININE 2.21*  CALCIUM 8.3*   GFR: Estimated Creatinine Clearance: 21.7 mL/min (A) (by C-G formula based on SCr of 2.21 mg/dL (H)). Liver Function Tests: Recent Labs  Lab 10/11/21 1815  AST 14*  ALT 8  ALKPHOS 81  BILITOT 0.4  PROT 5.8*  ALBUMIN 3.4*   No results for input(s): LIPASE, AMYLASE in the last 168 hours. No results for input(s): AMMONIA in the last 168 hours. Coagulation Profile: No results for input(s): INR, PROTIME in the last 168 hours. Cardiac Enzymes: No results for input(s): CKTOTAL, CKMB, CKMBINDEX, TROPONINI in the last 168 hours. BNP (last 3 results) No results for input(s): PROBNP in the last 8760 hours. HbA1C: No results for input(s): HGBA1C in the last 72 hours. CBG: No results for input(s): GLUCAP in the last 168 hours. Lipid Profile: No results for input(s): CHOL, HDL, LDLCALC, TRIG, CHOLHDL, LDLDIRECT in the last 72 hours. Thyroid Function Tests: No results for input(s): TSH, T4TOTAL, FREET4, T3FREE, THYROIDAB in the last 72 hours. Anemia Panel: No results for input(s): VITAMINB12, FOLATE, FERRITIN, TIBC, IRON, RETICCTPCT in the last 72 hours. Urine analysis:    Component Value Date/Time   COLORURINE YELLOW 07/14/2016  0008   APPEARANCEUR CLEAR 07/14/2016 0008   LABSPEC 1.008 07/14/2016 0008   PHURINE 7.0 07/14/2016 0008   GLUCOSEU NEGATIVE 07/14/2016 0008   HGBUR NEGATIVE 07/14/2016 0008   BILIRUBINUR NEGATIVE 07/14/2016 0008   KETONESUR NEGATIVE 07/14/2016 0008   PROTEINUR NEGATIVE 07/14/2016 0008   NITRITE NEGATIVE 07/14/2016 0008   LEUKOCYTESUR MODERATE (A) 07/14/2016 0008    Radiological Exams on Admission: DG Pelvis 1-2 Views  Result Date: 10/11/2021 CLINICAL DATA:  Status post fall.  Pain EXAM: PELVIS - 1-2 VIEW COMPARISON:  None. FINDINGS: Markedly limited evaluation due to overlapping osseous structures and overlying soft tissues. There is no evidence of pelvic fracture or diastasis. No pelvic bone lesions are seen. Severe degenerative changes left hip. Degenerative changes of the sacroiliac joints. No hip dislocation. Vascular calcification. IMPRESSION: 1. Markedly limited evaluation due to overlapping osseous structures and overlying soft tissues. 2. No definite acute displaced fracture, hip dislocation, or pelvic diastasis. Electronically Signed   By: Iven Finn M.D.   On: 10/11/2021 21:00   DG Ankle Complete Left  Addendum Date: 10/11/2021   ADDENDUM REPORT: 10/11/2021 20:56 ADDENDUM: Findings should state no evidence of "severe" arthropathy or other focal bone abnormality. Findings should also mention diffuse decreased bone density. Electronically Signed   By: Iven Finn M.D.   On: 10/11/2021 20:56   Result Date: 10/11/2021 CLINICAL DATA:  fall pain EXAM: LEFT ANKLE COMPLETE - 3+ VIEW  COMPARISON:  X-ray left tibia fibula 03/02/2005 FINDINGS: Age-indeterminate nondisplaced distal fibular fracture. There is no evidence of definite acute displaced fracture, dislocation, or joint effusion. Degenerative changes of the navicular noted. Plantar calcaneal spur. There is no evidence of arthropathy or other focal bone abnormality. Mild lateral ankle subcutaneus soft tissue edema. Vascular  calcifications. IMPRESSION: indeterminate nondisplaced distal fibular fracture. Associated lateral ankle subcutaneus soft tissue edema. Correlate with point tenderness to evaluate for acuity. Electronically Signed: By: Iven Finn M.D. On: 10/11/2021 20:53   DG Ankle Complete Right  Result Date: 10/11/2021 CLINICAL DATA:  Status fall EXAM: RIGHT ANKLE - COMPLETE 3+ VIEW COMPARISON:  None. FINDINGS: Diffusely decreased bone density. Acute inferiorly displaced medial malleolar fracture. Acute displaced distal fibular fracture. No definite posterior malleolar fracture. Vague cortical irregularity the medial talus with no definite acute displaced talar fracture. Degenerative changes of the midfoot. Plantar calcaneal spur. There is no evidence of severe arthropathy or other focal bone abnormality. Subcutaneus soft tissue edema of the ankle. Vascular clips overlie the soft tissues of the right distal leg. Vascular calcifications. IMPRESSION: 1. Bimalleolar acute displaced fracture. 2. Vague cortical irregularity the medial talus with no definite acute displaced talar fracture. 3. Diffusely decreased bone density. Electronically Signed   By: Iven Finn M.D.   On: 10/11/2021 20:57   DG Ankle Right Port  Result Date: 10/11/2021 CLINICAL DATA:  Post reduction. EXAM: PORTABLE RIGHT ANKLE - 2 VIEW COMPARISON:  10/11/2021. FINDINGS: Examination is limited due to overlying casting material. There is diffusely decreased mineralization of the bones. There is redemonstration of an oblique fracture of the distal fibular shaft and medial malleolus and possible fracture of the posterior tibia. Alignment is unchanged and there is no dislocation. A moderate calcaneal spurs present. Soft tissue swelling is noted about the ankle. IMPRESSION: Fractures of the distal fibula and medial malleolus, and possible fracture of the posterior tibia. Alignment is unchanged. Electronically Signed   By: Brett Fairy M.D.   On:  10/11/2021 23:47    EKG: EKG ordered for preop eval and currently pending.  Assessment/Plan Principal Problem:   Ankle fracture Active Problems:   Essential hypertension   RLS (restless legs syndrome)   AKI (acute kidney injury) (HCC)   Anemia   Bilateral ankle fractures Secondary to mechanical fall. X-ray of left ankle showing age-indeterminate nondisplaced distal fibular fracture.  X-ray of right ankle showing bimalleolar acute displaced fracture. Reduction of the right ankle attempted by ED provider but without improvement of the fracture. -Orthopedics consulted and requested transfer to [facility].  IV morphine as needed for pain.  Keep NPO.  Hold home aspirin and Plavix.  AKI Likely prerenal from dehydration.  Patient has dry mucous membranes.  BUN 38, creatinine 2.2 (baseline 1.2). -Gentle IV fluid hydration.  Monitor renal function and urine output.  Avoid nephrotoxic agents/hold home benazepril and Lasix.  Check urine sodium and creatinine.  Normocytic anemia Hemoglobin 10.4, MCV 99.1.  Hemoglobin was 13.6 in February 2021, no recent labs for comparison.  Patient is not endorsing any symptoms of GI bleed. -Anemia panel, FOBT  CAD Not endorsing any anginal symptoms. -Preop EKG ordered and currently pending.  Continue metoprolol and Imdur.  Hold aspirin and Plavix at this time.  Chronic diastolic CHF No signs of volume overload. -Gentle IV fluid hydration for AKI.  Hold home Lasix.  Monitor volume status closely.  Insulin-dependent type 2 diabetes Random blood glucose 208. -Check A1c.  Continue home basal insulin at a lower  dose as patient is currently n.p.o. (Lantus 10 units daily).  Sliding scale insulin sensitive every 4 hours.  RLS -Continue Lyrica and Sinemet  Hypertension Stable. -Continue amlodipine, hydralazine, Imdur, and metoprolol.  Hold benazepril and Lasix given AKI.  Hypothyroidism -Continue Synthroid  History of CVA -Hold aspirin and  Plavix at this time in anticipation of surgery for ankle fractures.  Allergic to statins.  DVT prophylaxis: SCDs Code Status: Patient wishes to be full code. Family Communication: No family available at this time. Disposition Plan: Status is: Inpatient  Remains inpatient appropriate because: Needs surgery for bilateral ankle fractures.  Level of care: Level of care: Med-Surg  The medical decision making on this patient was of high complexity and the patient is at high risk for clinical deterioration, therefore this is a level 3 visit.  Shela Leff MD [facility]  If 7PM-7AM, please contact night-coverage [email or web address]  10/12/2021, 4:50 AM

## 2021-10-12 NOTE — Progress Notes (Signed)
I spoke with Dr. [place] regarding patient's ankle injuries which will need surgery.  Given patient being on plavix and aspirin and with poor soft tissues, will need to postpone surgery until next week.  We will also plan to transfer the patient to [facility] in the next few days.  Appreciate all the help from hospitalist service.    Mayra Reel, MD [facility] 7:27 AM

## 2021-10-12 NOTE — ED Notes (Signed)
Purewick applied to patient. 

## 2021-10-12 NOTE — ED Notes (Signed)
Report to 6E nurse, [facility] called for transport

## 2021-10-13 DIAGNOSIS — S82899A Other fracture of unspecified lower leg, initial encounter for closed fracture: Secondary | ICD-10-CM | POA: Diagnosis not present

## 2021-10-13 LAB — CBC
HCT: 35.6 % — ABNORMAL LOW (ref 36.0–46.0)
Hemoglobin: 12 g/dL (ref 12.0–15.0)
MCH: 32.3 pg (ref 26.0–34.0)
MCHC: 33.7 g/dL (ref 30.0–36.0)
MCV: 95.7 fL (ref 80.0–100.0)
Platelets: 133 10*3/uL — ABNORMAL LOW (ref 150–400)
RBC: 3.72 MIL/uL — ABNORMAL LOW (ref 3.87–5.11)
RDW: 13.9 % (ref 11.5–15.5)
WBC: 11.3 10*3/uL — ABNORMAL HIGH (ref 4.0–10.5)
nRBC: 0 % (ref 0.0–0.2)

## 2021-10-13 LAB — GLUCOSE, CAPILLARY
Glucose-Capillary: 147 mg/dL — ABNORMAL HIGH (ref 70–99)
Glucose-Capillary: 149 mg/dL — ABNORMAL HIGH (ref 70–99)
Glucose-Capillary: 165 mg/dL — ABNORMAL HIGH (ref 70–99)
Glucose-Capillary: 149 mg/dL — ABNORMAL HIGH (ref 70–99)
Glucose-Capillary: 196 mg/dL — ABNORMAL HIGH (ref 70–99)

## 2021-10-13 LAB — BASIC METABOLIC PANEL
Anion gap: 7 (ref 5–15)
BUN: 23 mg/dL (ref 8–23)
CO2: 20 mmol/L — ABNORMAL LOW (ref 22–32)
Calcium: 8.1 mg/dL — ABNORMAL LOW (ref 8.9–10.3)
Chloride: 106 mmol/L (ref 98–111)
Creatinine, Ser: 1.14 mg/dL — ABNORMAL HIGH (ref 0.44–1.00)
GFR, Estimated: 47 mL/min — ABNORMAL LOW (ref >60)
Glucose, Bld: 173 mg/dL — ABNORMAL HIGH (ref 70–99)
Potassium: 4.4 mmol/L (ref 3.5–5.1)
Sodium: 133 mmol/L — ABNORMAL LOW (ref 135–145)

## 2021-10-13 LAB — MAGNESIUM: Magnesium: 1.6 mg/dL — ABNORMAL LOW (ref 1.7–2.4)

## 2021-10-13 MED ORDER — ENOXAPARIN SODIUM 40 MG/0.4ML IJ SOSY
40.0000 mg | PREFILLED_SYRINGE | INTRAMUSCULAR | Status: AC
  Administered 2021-10-13 – 2021-10-14 (×2): 40 mg via SUBCUTANEOUS
  Filled 2021-10-13 (×2): qty 0.4

## 2021-10-13 NOTE — Progress Notes (Signed)
I evaluated the patient at bedside today. She's very hard of hearing. Screams any time I try to evaluate her ankles. From best I can tell, the swelling is acceptable for surgery early next week.   We will plan for ORIF right ankle and possible left ankle pending CT scan. Hold aspirin and plavix for now.  Lovenox for DVT ppx.

## 2021-10-13 NOTE — Progress Notes (Signed)
PROGRESS NOTE    Sierra Wright  [id number] DOB: 14-Jan-1934 DOA: 10/11/2021 PCP: [name], MD    Brief Narrative:  Sierra Wright is an 87/F with history of CAD/CABG, chronic diastolic CHF, type 2 diabetes mellitus, CVA, hypertension, hypothyroidism, osteoarthritis presented to [facility], ED on 11/9 from her PCPs office following a mechanical fall with history of pain to both ankles, reports that her legs gave out prior to her fall, denied any loss of consciousness. -In the emergency room vital signs were stable, labs noted creatinine of 2.2 pelvic x-rays were unremarkable, right ankle x-ray with bimalleolar acute displaced fracture, cortical irregularity medial talus -Left ankle x-ray with indeterminate nondisplaced distal fibular fracture with lateral ankle subcutaneous soft tissue edema.  EDP attempted manual reduction right ankle but was unsuccessful.  Orthopedics was consulted.,  Aspirin and Plavix were held she was transferred to [facility] for surgery early next week   Assessment & Plan:   Bilateral ankle fractures -Following mechanical fall, unfortunately sustained right ankle bimalleolar displaced fracture and nondisplaced distal fibular fracture on the left  -Orthopedics consulting, continue to hold aspirin and Plavix -Pain control with narcotics and Robaxin -Bedrest at this time -Add Lovenox for DVT prophylaxis for the weekend -Ortho plans operative management early next week after washout of Plavix  Acute renal failure Creatinine on admission elevated 2.21, etiology likely secondary to prerenal from poor oral intake/dehydration Baseline creatinine 1.2.   --Cr 2.21>1.45> 1.1 -Improved, discontinue IV fluids today  CAD s/p CABG Chronic diastolic CHF --Holding aspirin/Plavix for planned operative management as above -Continue metoprolol -Lasix and benazepril on hold  Essential hypertension -Blood pressure slightly elevated, continue metoprolol, amlodipine,  hydralazine and Imdur -discontinue IV fluids today  Type 2 diabetes mellitus Hemoglobin A1c 6.5, well controlled.  Home regimen includes glimepiride 1 mg p.o. daily, Lantus 26 units subcutaneously daily. --Semglee 10 units [place] daily --moderate SSI for coverage --CBGs qAC/HS  Hypothyroidism --Levothyroxine 100 mcg p.o. daily  Hx CVA --Holding aspirin and Plavix as above.  Has allergy to statin.  RLS: Continue Lyrica and Sinemet   DVT prophylaxis: SCDs Start: 10/12/21 0412   Code Status: Full Code Family Communication: No family at bedside, will update spouse  Disposition Plan: Will likely need short-term rehab.  Remains inpatient appropriate because: Pending operative management of bilateral ankle fractures,   Consultants:  Orthopedics, Dr. Erlinda Hong  Procedures:  None  Antimicrobials:  None   Subjective: -Continues to report bilateral ankle pain, worse on the right, pain meds helping some  Objective: Vitals:   10/12/21 1925 10/12/21 2020 10/13/21 0442 10/13/21 0847  BP: (!) 147/54 (!) 167/69 (!) 154/62 (!) 163/99  Pulse:   69 (!) 59  Resp: (!) 21 20 18    Temp:  97.8 F (36.6 C) 98.1 F (36.7 C)   TempSrc:  Oral Oral   SpO2: (!) 89% 96% 97%   Weight:      Height:        Intake/Output Summary (Last 24 hours) at 10/13/2021 0955 Last data filed at 10/13/2021 [id number] Gross per 24 hour  Intake 1664.72 ml  Output 675 ml  Net 989.72 ml   Filed Weights   10/11/21 1805  Weight: 95.3 kg    Examination:  Gen: Awake, Alert, Oriented X 3, no distress HEENT: no JVD Lungs: Poor air movement bilaterally  CVS: S1S2/RRR Abd: soft, Non tender, non distended, BS present Extremities: Bilateral lower extremities/ankles with splint in place, otherwise moves all extremities independently, neurovascular intact Skin: No rashes  on exposed skin Psychiatry: Mood & affect appropriate.     Data Reviewed: I have personally reviewed following labs and imaging studies  CBC: Recent  Labs  Lab 10/11/21 1815 10/12/21 0505 10/13/21 0324  WBC 10.8* 13.5* 11.3*  NEUTROABS 8.3*  --   --   HGB 10.4* 13.7 12.0  HCT 32.4* 42.3 35.6*  MCV 99.1 97.5 95.7  PLT 152 175 133*   Basic Metabolic Panel: Recent Labs  Lab 10/11/21 1815 10/12/21 0505 10/13/21 0324  NA 138 140 133*  K 4.9 4.9 4.4  CL 108 109 106  CO2 25 25 20*  GLUCOSE 208* 135* 173*  BUN 38* 35* 23  CREATININE 2.21* 1.45* 1.14*  CALCIUM 8.3* 8.7* 8.1*  MG  --   --  1.6*   GFR: Estimated Creatinine Clearance: 42 mL/min (A) (by C-G formula based on SCr of 1.14 mg/dL (H)). Liver Function Tests: Recent Labs  Lab 10/11/21 1815  AST 14*  ALT 8  ALKPHOS 81  BILITOT 0.4  PROT 5.8*  ALBUMIN 3.4*   No results for input(s): LIPASE, AMYLASE in the last 168 hours. No results for input(s): AMMONIA in the last 168 hours. Coagulation Profile: No results for input(s): INR, PROTIME in the last 168 hours. Cardiac Enzymes: No results for input(s): CKTOTAL, CKMB, CKMBINDEX, TROPONINI in the last 168 hours. BNP (last 3 results) No results for input(s): PROBNP in the last 8760 hours. HbA1C: Recent Labs    10/12/21 0413  HGBA1C 6.5*   CBG: Recent Labs  Lab 10/12/21 1007 10/12/21 1208 10/12/21 1725 10/12/21 2058 10/13/21 0732  GLUCAP 143* 214* 148* 164* 147*   Lipid Profile: No results for input(s): CHOL, HDL, LDLCALC, TRIG, CHOLHDL, LDLDIRECT in the last 72 hours. Thyroid Function Tests: No results for input(s): TSH, T4TOTAL, FREET4, T3FREE, THYROIDAB in the last 72 hours. Anemia Panel: Recent Labs    10/12/21 0441  VITAMINB12 1,135*  FOLATE 6.7  FERRITIN 48  TIBC 311  IRON 31  RETICCTPCT 1.9   Sepsis Labs: No results for input(s): PROCALCITON, LATICACIDVEN in the last 168 hours.  Recent Results (from the past 240 hour(s))  Resp Panel by RT-PCR (Flu A&B, Covid) Nasopharyngeal Swab     Status: None   Collection Time: 10/11/21 10:19 PM   Specimen: Nasopharyngeal Swab; Nasopharyngeal(NP)  swabs in vial transport medium  Result Value Ref Range Status   SARS Coronavirus 2 by RT PCR NEGATIVE NEGATIVE Final    Comment: (NOTE) SARS-CoV-2 target nucleic acids are NOT DETECTED.  The SARS-CoV-2 RNA is generally detectable in upper respiratory specimens during the acute phase of infection. The lowest concentration of SARS-CoV-2 viral copies this assay can detect is 138 copies/mL. A negative result does not preclude SARS-Cov-2 infection and should not be used as the sole basis for treatment or other patient management decisions. A negative result may occur with  improper specimen collection/handling, submission of specimen other than nasopharyngeal swab, presence of viral mutation(s) within the areas targeted by this assay, and inadequate number of viral copies(<138 copies/mL). A negative result must be combined with clinical observations, patient history, and epidemiological information. The expected result is Negative.  Fact Sheet for Patients:  BloggerCourse.com  Fact Sheet for Healthcare Providers:  SeriousBroker.it  This test is no t yet approved or cleared by the [place] FDA and  has been authorized for detection and/or diagnosis of SARS-CoV-2 by FDA under an Emergency Use Authorization (EUA). This EUA will remain  in effect (meaning this test can be  used) for the duration of the COVID-19 declaration under Section 564(b)(1) of the Act, 21 U.S.C.section 360bbb-3(b)(1), unless the authorization is terminated  or revoked sooner.       Influenza A by PCR NEGATIVE NEGATIVE Final   Influenza B by PCR NEGATIVE NEGATIVE Final    Comment: (NOTE) The Xpert Xpress SARS-CoV-2/FLU/RSV plus assay is intended as an aid in the diagnosis of influenza from Nasopharyngeal swab specimens and should not be used as a sole basis for treatment. Nasal washings and aspirates are unacceptable for Xpert Xpress  SARS-CoV-2/FLU/RSV testing.  Fact Sheet for Patients: BloggerCourse.comhttps://www.fda.gov/media/152166/download  Fact Sheet for Healthcare Providers: SeriousBroker.ithttps://www.fda.gov/media/152162/download  This test is not yet approved or cleared by the [place] FDA and has been authorized for detection and/or diagnosis of SARS-CoV-2 by FDA under an Emergency Use Authorization (EUA). This EUA will remain in effect (meaning this test can be used) for the duration of the COVID-19 declaration under Section 564(b)(1) of the Act, 21 U.S.C. section 360bbb-3(b)(1), unless the authorization is terminated or revoked.  Performed at [facility], [address] [place], [place] [address]          Radiology Studies: DG Pelvis 1-2 Views  Result Date: 10/11/2021 CLINICAL DATA:  Status post fall.  Pain EXAM: PELVIS - 1-2 VIEW COMPARISON:  None. FINDINGS: Markedly limited evaluation due to overlapping osseous structures and overlying soft tissues. There is no evidence of pelvic fracture or diastasis. No pelvic bone lesions are seen. Severe degenerative changes left hip. Degenerative changes of the sacroiliac joints. No hip dislocation. Vascular calcification. IMPRESSION: 1. Markedly limited evaluation due to overlapping osseous structures and overlying soft tissues. 2. No definite acute displaced fracture, hip dislocation, or pelvic diastasis. Electronically Signed   By: Tish FredericksonMorgane  Naveau M.D.   On: 10/11/2021 21:00   DG Ankle Complete Left  Addendum Date: 10/11/2021   ADDENDUM REPORT: 10/11/2021 20:56 ADDENDUM: Findings should state no evidence of "severe" arthropathy or other focal bone abnormality. Findings should also mention diffuse decreased bone density. Electronically Signed   By: Tish FredericksonMorgane  Naveau M.D.   On: 10/11/2021 20:56   Result Date: 10/11/2021 CLINICAL DATA:  fall pain EXAM: LEFT ANKLE COMPLETE - 3+ VIEW COMPARISON:  X-ray left tibia fibula 03/02/2005 FINDINGS: Age-indeterminate  nondisplaced distal fibular fracture. There is no evidence of definite acute displaced fracture, dislocation, or joint effusion. Degenerative changes of the navicular noted. Plantar calcaneal spur. There is no evidence of arthropathy or other focal bone abnormality. Mild lateral ankle subcutaneus soft tissue edema. Vascular calcifications. IMPRESSION: indeterminate nondisplaced distal fibular fracture. Associated lateral ankle subcutaneus soft tissue edema. Correlate with point tenderness to evaluate for acuity. Electronically Signed: By: Tish FredericksonMorgane  Naveau M.D. On: 10/11/2021 20:53   DG Ankle Complete Right  Result Date: 10/11/2021 CLINICAL DATA:  Status fall EXAM: RIGHT ANKLE - COMPLETE 3+ VIEW COMPARISON:  None. FINDINGS: Diffusely decreased bone density. Acute inferiorly displaced medial malleolar fracture. Acute displaced distal fibular fracture. No definite posterior malleolar fracture. Vague cortical irregularity the medial talus with no definite acute displaced talar fracture. Degenerative changes of the midfoot. Plantar calcaneal spur. There is no evidence of severe arthropathy or other focal bone abnormality. Subcutaneus soft tissue edema of the ankle. Vascular clips overlie the soft tissues of the right distal leg. Vascular calcifications. IMPRESSION: 1. Bimalleolar acute displaced fracture. 2. Vague cortical irregularity the medial talus with no definite acute displaced talar fracture. 3. Diffusely decreased bone density. Electronically Signed   By: Normajean GlasgowMorgane  Naveau M.D.  On: 10/11/2021 20:57   CT ANKLE RIGHT WO CONTRAST  Result Date: 10/12/2021 CLINICAL DATA:  Fracture, ankle EXAM: CT OF THE RIGHT ANKLE WITHOUT CONTRAST TECHNIQUE: Multidetector CT imaging of the right ankle was performed according to the standard protocol. Multiplanar CT image reconstructions were also generated. COMPARISON:  Ankle radiograph 10/11/2021 FINDINGS: Bones/Joint/Cartilage There is a trimalleolar ankle fracture.  Oblique distal fibular fracture has minimal 2 mm posterolateral displacement. Medial malleolar fracture has minimal 1-2 mm placement. Posterior malleolar fracture has proximally 1 mm posterior displacement and involves approximately 15% of the articular surface. Tiny fracture of the anterolateral distal tibia consistent with anterior inferior tibiofibular ligament avulsion (series 6, image 42). There is a splint in place. The ankle mortise appears well aligned. Osteopenia. There is mild posterior subtalar, talonavicular, calcaneocuboid, navicular-cuneiform and diffuse tarsometatarsal joint osteoarthritis. Ligaments Suboptimally assessed by CT. Muscles and Tendons Generalized muscle atrophy.  No evidence of tendon entrapment. Soft tissues Extensive soft tissue swelling. IMPRESSION: Trimalleolar ankle fracture with minimal 1-2 mm displacement. The posterior malleolar fracture involves approximately 15% of the articular surface. Tiny additional fracture of the anterolateral distal tibia consistent with an anterior inferior tibiofibular ligament avulsion. Diffuse osteopenia. Generalized muscle atrophy. Mild hindfoot and midfoot osteoarthritis. Extensive soft tissue swelling. Electronically Signed   By: Maurine Simmering M.D.   On: 10/12/2021 08:59   DG Ankle Right Port  Result Date: 10/11/2021 CLINICAL DATA:  Post reduction. EXAM: PORTABLE RIGHT ANKLE - 2 VIEW COMPARISON:  10/11/2021. FINDINGS: Examination is limited due to overlying casting material. There is diffusely decreased mineralization of the bones. There is redemonstration of an oblique fracture of the distal fibular shaft and medial malleolus and possible fracture of the posterior tibia. Alignment is unchanged and there is no dislocation. A moderate calcaneal spurs present. Soft tissue swelling is noted about the ankle. IMPRESSION: Fractures of the distal fibula and medial malleolus, and possible fracture of the posterior tibia. Alignment is unchanged.  Electronically Signed   By: Brett Fairy M.D.   On: 10/11/2021 23:47        Scheduled Meds:  amLODipine  5 mg Oral Daily   carbidopa-levodopa  1.5 tablet Oral QHS   hydrALAZINE  25 mg Oral BID   insulin aspart  0-15 Units Subcutaneous TID WC   insulin aspart  0-5 Units Subcutaneous QHS   insulin glargine-yfgn  10 Units Subcutaneous Daily   isosorbide mononitrate  30 mg Oral Daily   levothyroxine  100 mcg Oral QAC breakfast   metoprolol succinate  25 mg Oral Daily   nystatin   Topical TID   pregabalin  50 mg Oral BID   Continuous Infusions:     LOS: 1 day    Time spent: 71min   Domenic Polite, MD [facility] Available via Epic secure chat 7am-7pm After these hours, please refer to coverage provider listed on [email or web address] 10/13/2021, 9:55 AM

## 2021-10-13 NOTE — Progress Notes (Signed)
Subjective:    Patient reports pain as moderate.  Patient notes that two days ago she was getting out of her car when her feet gave out from underneath her.  She fell to the ground in pain.  She was seen at [facility] ED and [facility].  She is in BLE splints and resting comfortably.    Objective: Vital signs in last 24 hours: Temp:  [97.8 F (36.6 C)-98.4 F (36.9 C)] 98.1 F (36.7 C) (11/11 0442) Pulse Rate:  [62-71] 69 (11/11 0442) Resp:  [13-23] 18 (11/11 0442) BP: (147-190)/(54-72) 154/62 (11/11 0442) SpO2:  [88 %-100 %] 97 % (11/11 0442)  Intake/Output from previous day: 11/10 0701 - 11/11 0700 In: 1664.7 [P.O.:540; I.V.:1124.7] Out: 675 [Urine:675] Intake/Output this shift: No intake/output data recorded.  Recent Labs    10/11/21 1815 10/12/21 0505 10/13/21 0324  HGB 10.4* 13.7 12.0   Recent Labs    10/12/21 0505 10/13/21 0324  WBC 13.5* 11.3*  RBC 4.34 3.72*  HCT 42.3 35.6*  PLT 175 133*   Recent Labs    10/12/21 0505 10/13/21 0324  NA 140 133*  K 4.9 4.4  CL 109 106  CO2 25 20*  BUN 35* 23  CREATININE 1.45* 1.14*  GLUCOSE 135* 173*  CALCIUM 8.7* 8.1*   No results for input(s): LABPT, INR in the last 72 hours.  Neurologically intact Sensation intact distally BLE splints in place Able to wiggle toes    Assessment/Plan:    PLAN NWB BLE Continue with splints to BLE Please continue to elevate BLE on multiple pillows to help with swelling Continue to hold aspirin/plavix in anticipation of surgery early next week       Cristie Hem 10/13/2021, 7:50 AM

## 2021-10-14 DIAGNOSIS — S82899A Other fracture of unspecified lower leg, initial encounter for closed fracture: Secondary | ICD-10-CM | POA: Diagnosis not present

## 2021-10-14 LAB — BASIC METABOLIC PANEL
Anion gap: 7 (ref 5–15)
BUN: 19 mg/dL (ref 8–23)
CO2: 22 mmol/L (ref 22–32)
Calcium: 8.3 mg/dL — ABNORMAL LOW (ref 8.9–10.3)
Chloride: 103 mmol/L (ref 98–111)
Creatinine, Ser: 1.13 mg/dL — ABNORMAL HIGH (ref 0.44–1.00)
GFR, Estimated: 48 mL/min — ABNORMAL LOW (ref >60)
Glucose, Bld: 161 mg/dL — ABNORMAL HIGH (ref 70–99)
Potassium: 4.3 mmol/L (ref 3.5–5.1)
Sodium: 132 mmol/L — ABNORMAL LOW (ref 135–145)

## 2021-10-14 LAB — GLUCOSE, CAPILLARY
Glucose-Capillary: 164 mg/dL — ABNORMAL HIGH (ref 70–99)
Glucose-Capillary: 194 mg/dL — ABNORMAL HIGH (ref 70–99)
Glucose-Capillary: 165 mg/dL — ABNORMAL HIGH (ref 70–99)
Glucose-Capillary: 203 mg/dL — ABNORMAL HIGH (ref 70–99)

## 2021-10-14 LAB — CBC
HCT: 37.2 % (ref 36.0–46.0)
Hemoglobin: 11.8 g/dL — ABNORMAL LOW (ref 12.0–15.0)
MCH: 30.9 pg (ref 26.0–34.0)
MCHC: 31.7 g/dL (ref 30.0–36.0)
MCV: 97.4 fL (ref 80.0–100.0)
Platelets: 160 10*3/uL (ref 150–400)
RBC: 3.82 MIL/uL — ABNORMAL LOW (ref 3.87–5.11)
RDW: 13.8 % (ref 11.5–15.5)
WBC: 12 10*3/uL — ABNORMAL HIGH (ref 4.0–10.5)
nRBC: 0 % (ref 0.0–0.2)

## 2021-10-14 MED ORDER — ISOSORBIDE MONONITRATE ER 60 MG PO TB24
60.0000 mg | ORAL_TABLET | Freq: Every day | ORAL | Status: DC
  Administered 2021-10-14 – 2021-10-23 (×10): 60 mg via ORAL
  Filled 2021-10-14 (×10): qty 1

## 2021-10-14 MED ORDER — SENNOSIDES-DOCUSATE SODIUM 8.6-50 MG PO TABS
1.0000 | ORAL_TABLET | Freq: Two times a day (BID) | ORAL | Status: DC
  Administered 2021-10-14 – 2021-10-16 (×5): 1 via ORAL
  Filled 2021-10-14 (×5): qty 1

## 2021-10-14 MED ORDER — OXYCODONE HCL 5 MG PO TABS
10.0000 mg | ORAL_TABLET | ORAL | Status: DC | PRN
  Administered 2021-10-14 – 2021-10-23 (×22): 10 mg via ORAL
  Filled 2021-10-14 (×23): qty 2

## 2021-10-14 MED ORDER — LABETALOL HCL 5 MG/ML IV SOLN: 10.0000 mg | INTRAVENOUS | Status: DC | PRN

## 2021-10-14 MED ORDER — ONDANSETRON HCL 4 MG/2ML IJ SOLN
4.0000 mg | Freq: Four times a day (QID) | INTRAMUSCULAR | Status: DC | PRN
  Administered 2021-10-14 – 2021-10-22 (×2): 4 mg via INTRAVENOUS
  Filled 2021-10-14 (×4): qty 2

## 2021-10-14 MED ORDER — HYDRALAZINE HCL 50 MG PO TABS
50.0000 mg | ORAL_TABLET | Freq: Three times a day (TID) | ORAL | Status: DC
  Administered 2021-10-14 – 2021-10-21 (×20): 50 mg via ORAL
  Filled 2021-10-14 (×20): qty 1

## 2021-10-14 MED ORDER — POLYETHYLENE GLYCOL 3350 17 G PO PACK
17.0000 g | PACK | Freq: Every day | ORAL | Status: DC | PRN
  Administered 2021-10-16: 17 g via ORAL
  Filled 2021-10-14: qty 1

## 2021-10-14 NOTE — Progress Notes (Signed)
Page attending, lost IV access for morphine. Provider to place po order until IV can be reestablished.

## 2021-10-14 NOTE — Progress Notes (Signed)
Paged Dr. Jomarie Longs and advise that pt is in A. [place].

## 2021-10-14 NOTE — Progress Notes (Signed)
PROGRESS NOTE    Sierra Wright  [id number] DOB: 06-Jul-1934 DOA: 10/11/2021 PCP: [name], MD    Brief Narrative:  Sierra Wright is an 87/F with history of CAD/CABG, chronic diastolic CHF, type 2 diabetes mellitus, CVA, hypertension, hypothyroidism, osteoarthritis presented to [facility], ED on 11/9 from her PCPs office following a mechanical fall with history of pain to both ankles, reports that her legs gave out prior to her fall, denied any loss of consciousness. -In the emergency room vital signs were stable, labs noted creatinine of 2.2 pelvic x-rays were unremarkable, right ankle x-ray with bimalleolar acute displaced fracture, cortical irregularity medial talus -Left ankle x-ray with indeterminate nondisplaced distal fibular fracture with lateral ankle subcutaneous soft tissue edema.  EDP attempted manual reduction right ankle but was unsuccessful.  Orthopedics was consulted.,  Aspirin and Plavix were held she was transferred to [facility] for surgery early next week -11/21 noted to be in A. Fib/atrial flutter  Assessment & Plan:   Bilateral ankle fractures -Following mechanical fall, unfortunately sustained right ankle bimalleolar displaced fracture and nondisplaced distal fibular fracture on the left  -Orthopedics consulting, continue to hold aspirin and Plavix -Pain control with narcotics and Robaxin -Bedrest at this time -Add Lovenox for DVT prophylaxis for the weekend -Ortho plans operative management early next week after washout of Plavix  Atrial flutter/A. Fib -Detected on telemetry this morning, heart rate controlled, continue Lopressor -Not appropriate for anticoagulation now in the setting of bilateral ankle fractures with displacement, need for surgery in 48 hours  Acute renal failure Creatinine on admission elevated 2.21, etiology likely secondary to prerenal from poor oral intake/dehydration Baseline creatinine 1.2.   --Cr 2.21>1.45> 1.1 -Improved,  discontinued IV fluids  CAD s/p CABG Chronic diastolic CHF --Holding aspirin/Plavix for planned operative management as above -Continue metoprolol -Lasix and benazepril on hold  Essential hypertension -Blood pressure slightly elevated, continue metoprolol, amlodipine, hydralazine and Imdur  -Blood pressure elevated, increased hydralazine and Imdur dose --discontinued IV fluids  Type 2 diabetes mellitus Hemoglobin A1c 6.5, well controlled.  Home regimen includes glimepiride 1 mg p.o. daily, Lantus 26 units subcutaneously daily. --Semglee 10 units [place] daily --moderate SSI for coverage --CBGs qAC/HS  Hypothyroidism --Levothyroxine 100 mcg p.o. daily  Hx CVA --Holding aspirin and Plavix as above.  Has allergy to statin.  RLS: Continue Lyrica and Sinemet   DVT prophylaxis: enoxaparin (LOVENOX) injection 40 mg Start: 10/13/21 1100 SCDs Start: 10/12/21 0412   Code Status: Full Code Family Communication: No family at bedside  Disposition Plan: Will likely need short-term rehab.  Remains inpatient appropriate because: Pending operative management of bilateral ankle fractures,   Consultants:  Orthopedics, Dr. Erlinda Hong  Procedures:  None  Antimicrobials:  None   Subjective: -Continues to report discomfort in both ankles especially right  Objective: Vitals:   10/14/21 0435 10/14/21 0800 10/14/21 0803 10/14/21 0955  BP: (!) 182/73 (!) 190/73 (!) 190/73 (!) 178/61  Pulse:   70   Resp: 20 13  19   Temp: 98.1 F (36.7 C)     TempSrc: Oral     SpO2: 95%     Weight:      Height:        Intake/Output Summary (Last 24 hours) at 10/14/2021 1106 Last data filed at 10/14/2021 0436 Gross per 24 hour  Intake --  Output 900 ml  Net -900 ml   Filed Weights   10/11/21 1805  Weight: 95.3 kg    Examination:  Gen: Awake, Alert, Oriented  X 3,  HEENT: no JVD Lungs: Decreased breath sounds at the bases otherwise clear CVS: S1-S2, irregularly irregular rhythm Abd: soft, Non  tender, non distended, BS present Extremities:  Bilateral lower extremities/ankles with splint in place, otherwise moves all extremities independently, neurovascular intact Skin: No rashes on exposed skin Psychiatry: Mood & affect appropriate.     Data Reviewed: I have personally reviewed following labs and imaging studies  CBC: Recent Labs  Lab 10/11/21 1815 10/12/21 0505 10/13/21 0324 10/14/21 0339  WBC 10.8* 13.5* 11.3* 12.0*  NEUTROABS 8.3*  --   --   --   HGB 10.4* 13.7 12.0 11.8*  HCT 32.4* 42.3 35.6* 37.2  MCV 99.1 97.5 95.7 97.4  PLT 152 175 133* 0000000   Basic Metabolic Panel: Recent Labs  Lab 10/11/21 1815 10/12/21 0505 10/13/21 0324 10/14/21 0339  NA 138 140 133* 132*  K 4.9 4.9 4.4 4.3  CL 108 109 106 103  CO2 25 25 20* 22  GLUCOSE 208* 135* 173* 161*  BUN 38* 35* 23 19  CREATININE 2.21* 1.45* 1.14* 1.13*  CALCIUM 8.3* 8.7* 8.1* 8.3*  MG  --   --  1.6*  --    GFR: Estimated Creatinine Clearance: 42.3 mL/min (A) (by C-G formula based on SCr of 1.13 mg/dL (H)). Liver Function Tests: Recent Labs  Lab 10/11/21 1815  AST 14*  ALT 8  ALKPHOS 81  BILITOT 0.4  PROT 5.8*  ALBUMIN 3.4*   No results for input(s): LIPASE, AMYLASE in the last 168 hours. No results for input(s): AMMONIA in the last 168 hours. Coagulation Profile: No results for input(s): INR, PROTIME in the last 168 hours. Cardiac Enzymes: No results for input(s): CKTOTAL, CKMB, CKMBINDEX, TROPONINI in the last 168 hours. BNP (last 3 results) No results for input(s): PROBNP in the last 8760 hours. HbA1C: Recent Labs    10/12/21 0413  HGBA1C 6.5*   CBG: Recent Labs  Lab 10/13/21 1110 10/13/21 1315 10/13/21 1615 10/13/21 2233 10/14/21 0748  GLUCAP 149* 165* 149* 196* 164*   Lipid Profile: No results for input(s): CHOL, HDL, LDLCALC, TRIG, CHOLHDL, LDLDIRECT in the last 72 hours. Thyroid Function Tests: No results for input(s): TSH, T4TOTAL, FREET4, T3FREE, THYROIDAB in the last  72 hours. Anemia Panel: Recent Labs    10/12/21 0441  VITAMINB12 1,135*  FOLATE 6.7  FERRITIN 48  TIBC 311  IRON 31  RETICCTPCT 1.9   Sepsis Labs: No results for input(s): PROCALCITON, LATICACIDVEN in the last 168 hours.  Recent Results (from the past 240 hour(s))  Resp Panel by RT-PCR (Flu A&B, Covid) Nasopharyngeal Swab     Status: None   Collection Time: 10/11/21 10:19 PM   Specimen: Nasopharyngeal Swab; Nasopharyngeal(NP) swabs in vial transport medium  Result Value Ref Range Status   SARS Coronavirus 2 by RT PCR NEGATIVE NEGATIVE Final    Comment: (NOTE) SARS-CoV-2 target nucleic acids are NOT DETECTED.  The SARS-CoV-2 RNA is generally detectable in upper respiratory specimens during the acute phase of infection. The lowest concentration of SARS-CoV-2 viral copies this assay can detect is 138 copies/mL. A negative result does not preclude SARS-Cov-2 infection and should not be used as the sole basis for treatment or other patient management decisions. A negative result may occur with  improper specimen collection/handling, submission of specimen other than nasopharyngeal swab, presence of viral mutation(s) within the areas targeted by this assay, and inadequate number of viral copies(<138 copies/mL). A negative result must be combined with clinical observations, patient  history, and epidemiological information. The expected result is Negative.  Fact Sheet for Patients:  BloggerCourse.com  Fact Sheet for Healthcare Providers:  SeriousBroker.it  This test is no t yet approved or cleared by the [place] FDA and  has been authorized for detection and/or diagnosis of SARS-CoV-2 by FDA under an Emergency Use Authorization (EUA). This EUA will remain  in effect (meaning this test can be used) for the duration of the COVID-19 declaration under Section 564(b)(1) of the Act, 21 U.S.C.section 360bbb-3(b)(1), unless the  authorization is terminated  or revoked sooner.       Influenza A by PCR NEGATIVE NEGATIVE Final   Influenza B by PCR NEGATIVE NEGATIVE Final    Comment: (NOTE) The Xpert Xpress SARS-CoV-2/FLU/RSV plus assay is intended as an aid in the diagnosis of influenza from Nasopharyngeal swab specimens and should not be used as a sole basis for treatment. Nasal washings and aspirates are unacceptable for Xpert Xpress SARS-CoV-2/FLU/RSV testing.  Fact Sheet for Patients: BloggerCourse.com  Fact Sheet for Healthcare Providers: SeriousBroker.it  This test is not yet approved or cleared by the [place] FDA and has been authorized for detection and/or diagnosis of SARS-CoV-2 by FDA under an Emergency Use Authorization (EUA). This EUA will remain in effect (meaning this test can be used) for the duration of the COVID-19 declaration under Section 564(b)(1) of the Act, 21 U.S.C. section 360bbb-3(b)(1), unless the authorization is terminated or revoked.  Performed at [facility], [address], [place], [place] [address]      Scheduled Meds:  amLODipine  5 mg Oral Daily   carbidopa-levodopa  1.5 tablet Oral QHS   enoxaparin (LOVENOX) injection  40 mg Subcutaneous Q24H   hydrALAZINE  50 mg Oral TID   insulin aspart  0-15 Units Subcutaneous TID WC   insulin aspart  0-5 Units Subcutaneous QHS   insulin glargine-yfgn  10 Units Subcutaneous Daily   isosorbide mononitrate  60 mg Oral Daily   levothyroxine  100 mcg Oral QAC breakfast   metoprolol succinate  25 mg Oral Daily   nystatin   Topical TID   pregabalin  50 mg Oral BID   senna-docusate  1 tablet Oral BID   Continuous Infusions:   LOS: 2 days    Time spent:   Zannie Cove, MD [facility] Available via Epic secure chat 7am-7pm After these hours, please refer to coverage provider listed on [email or web address] 10/14/2021, 11:06 AM

## 2021-10-15 ENCOUNTER — Inpatient Hospital Stay (HOSPITAL_COMMUNITY): Payer: Medicare Other

## 2021-10-15 DIAGNOSIS — S82899A Other fracture of unspecified lower leg, initial encounter for closed fracture: Secondary | ICD-10-CM | POA: Diagnosis not present

## 2021-10-15 LAB — GLUCOSE, CAPILLARY
Glucose-Capillary: 174 mg/dL — ABNORMAL HIGH (ref 70–99)
Glucose-Capillary: 290 mg/dL — ABNORMAL HIGH (ref 70–99)
Glucose-Capillary: 230 mg/dL — ABNORMAL HIGH (ref 70–99)

## 2021-10-15 LAB — CBC
HCT: 32.9 % — ABNORMAL LOW (ref 36.0–46.0)
Hemoglobin: 10.7 g/dL — ABNORMAL LOW (ref 12.0–15.0)
MCH: 31.4 pg (ref 26.0–34.0)
MCHC: 32.5 g/dL (ref 30.0–36.0)
MCV: 96.5 fL (ref 80.0–100.0)
Platelets: 158 10*3/uL (ref 150–400)
RBC: 3.41 MIL/uL — ABNORMAL LOW (ref 3.87–5.11)
RDW: 13.8 % (ref 11.5–15.5)
WBC: 11.5 10*3/uL — ABNORMAL HIGH (ref 4.0–10.5)
nRBC: 0 % (ref 0.0–0.2)

## 2021-10-15 LAB — BASIC METABOLIC PANEL
Anion gap: 6 (ref 5–15)
BUN: 25 mg/dL — ABNORMAL HIGH (ref 8–23)
CO2: 22 mmol/L (ref 22–32)
Calcium: 8 mg/dL — ABNORMAL LOW (ref 8.9–10.3)
Chloride: 104 mmol/L (ref 98–111)
Creatinine, Ser: 1.14 mg/dL — ABNORMAL HIGH (ref 0.44–1.00)
GFR, Estimated: 47 mL/min — ABNORMAL LOW (ref >60)
Glucose, Bld: 133 mg/dL — ABNORMAL HIGH (ref 70–99)
Potassium: 4.4 mmol/L (ref 3.5–5.1)
Sodium: 132 mmol/L — ABNORMAL LOW (ref 135–145)

## 2021-10-15 MED ORDER — ENOXAPARIN SODIUM 40 MG/0.4ML IJ SOSY
40.0000 mg | PREFILLED_SYRINGE | INTRAMUSCULAR | Status: AC
  Administered 2021-10-15: 40 mg via SUBCUTANEOUS
  Filled 2021-10-15: qty 0.4

## 2021-10-15 MED ORDER — ONDANSETRON HCL 4 MG PO TABS
4.0000 mg | ORAL_TABLET | Freq: Three times a day (TID) | ORAL | Status: DC | PRN
  Administered 2021-10-15 – 2021-10-19 (×5): 4 mg via ORAL
  Filled 2021-10-15 (×5): qty 1

## 2021-10-15 NOTE — Progress Notes (Signed)
Surgery will be postponed until wed to allow for washout of aspirin and plavix.  I have spoken to Dr. Lajoyce Corners about this patient and he has agreed to perform surgery at that time.

## 2021-10-15 NOTE — Progress Notes (Signed)
PROGRESS NOTE    FARADAY BALSTER  [id number] DOB: 1934/08/16 DOA: 10/11/2021 PCP: [name], MD    Brief Narrative:  [name] is an 87/F with history of CAD/CABG, chronic diastolic CHF, type 2 diabetes mellitus, CVA, hypertension, hypothyroidism, osteoarthritis presented to [facility] ED on 11/9 from her PCPs office following a mechanical fall with history of pain to both ankles, reports that her legs gave out prior to her fall, denied any loss of consciousness. -In the emergency room vital signs were stable, labs noted creatinine of 2.2 pelvic x-rays were unremarkable, right ankle x-ray with bimalleolar acute displaced fracture, cortical irregularity medial talus -Left ankle x-ray with indeterminate nondisplaced distal fibular fracture with lateral ankle subcutaneous soft tissue edema.  EDP attempted manual reduction right ankle but was unsuccessful.  Orthopedics was consulted.,  Aspirin and Plavix were held she was transferred to [facility] for surgery early next week -11/21 noted to be in A. Fib/atrial flutter  Assessment & Plan:   Bilateral ankle fractures -Following mechanical fall, unfortunately sustained right ankle bimalleolar displaced fracture and nondisplaced distal fibular fracture on the left  -Orthopedics consulting, continue to hold aspirin and Plavix -Pain control with narcotics and Robaxin -Bedrest at this time -Lovenox for DVT prophylaxis, hold after today's dose for surgery -Ortho plans operative management in 1 to 2 days after washout of Plavix  Atrial flutter/A. Fib -Detected on telemetry yesterday, heart rate controlled, continue Lopressor -Not appropriate for anticoagulation now in the setting of bilateral ankle fractures with displacement, need for surgery in 48 hours  Acute renal failure Creatinine on admission elevated 2.21, etiology likely secondary to prerenal from poor oral intake/dehydration Baseline creatinine 1.2.   --Cr 2.21>1.45>  1.1 -Improved, fluids discontinued  CAD s/p CABG Chronic diastolic CHF --Holding aspirin/Plavix for planned operative management as above -Continue metoprolol -Lasix and benazepril on hold  Essential hypertension -Blood pressure slightly elevated, continue metoprolol, amlodipine, hydralazine and Imdur  -Blood pressure elevated, increased hydralazine and Imdur dose --discontinued IV fluids  Type 2 diabetes mellitus Hemoglobin A1c 6.5, well controlled.  Home regimen includes glimepiride 1 mg p.o. daily, Lantus 26 units subcutaneously daily. --Semglee 10 units [place] daily --moderate SSI for coverage --CBGs qAC/HS  Hypothyroidism --Levothyroxine 100 mcg p.o. daily  Hx CVA --Holding aspirin and Plavix as above.  Allergic to statin.  RLS: Continue Lyrica and Sinemet   DVT prophylaxis: SCDs Start: 10/12/21 0412   Code Status: Full Code Family Communication: No family at bedside  Disposition Plan: Will likely need short-term rehab.  Remains inpatient appropriate because: Pending operative management of bilateral ankle fractures,   Consultants:  Orthopedics, Dr. Erlinda Hong  Procedures:  None  Subjective: -Feels a little better overall, continues to have discomfort in both ankles especially on the right  Objective: Vitals:   10/14/21 1840 10/14/21 2000 10/15/21 0900 10/15/21 0909  BP:  (!) 147/45 (!) 150/61   Pulse:  (!) 58 74 74  Resp: 19 18    Temp:  97.7 F (36.5 C) 97.7 F (36.5 C)   TempSrc:  Oral Oral   SpO2: 97% 97%    Weight:      Height:        Intake/Output Summary (Last 24 hours) at 10/15/2021 1020 Last data filed at 10/14/2021 1401 Gross per 24 hour  Intake --  Output 275 ml  Net -275 ml   Filed Weights   10/11/21 1805  Weight: 95.3 kg    Examination:  Gen: Pleasant elderly female sitting up in  bed, AAOx3, no distress HEENT: No JVD CVS: S1-S2, regular rate rhythm Lungs: Decreased breath sounds to bases Abdomen: Soft, nontender, bowel sounds  present Extremities:  Bilateral lower extremities/ankles with splint in place, otherwise moves all extremities independently, neurovascular intact Skin: No rashes on exposed skin Psychiatry: Mood & affect appropriate.     Data Reviewed: I have personally reviewed following labs and imaging studies  CBC: Recent Labs  Lab 10/11/21 1815 10/12/21 0505 10/13/21 0324 10/14/21 0339 10/15/21 0140  WBC 10.8* 13.5* 11.3* 12.0* 11.5*  NEUTROABS 8.3*  --   --   --   --   HGB 10.4* 13.7 12.0 11.8* 10.7*  HCT 32.4* 42.3 35.6* 37.2 32.9*  MCV 99.1 97.5 95.7 97.4 96.5  PLT 152 175 133* 160 158   Basic Metabolic Panel: Recent Labs  Lab 10/11/21 1815 10/12/21 0505 10/13/21 0324 10/14/21 0339 10/15/21 0140  NA 138 140 133* 132* 132*  K 4.9 4.9 4.4 4.3 4.4  CL 108 109 106 103 104  CO2 25 25 20* 22 22  GLUCOSE 208* 135* 173* 161* 133*  BUN 38* 35* 23 19 25*  CREATININE 2.21* 1.45* 1.14* 1.13* 1.14*  CALCIUM 8.3* 8.7* 8.1* 8.3* 8.0*  MG  --   --  1.6*  --   --    GFR: Estimated Creatinine Clearance: 42 mL/min (A) (by C-G formula based on SCr of 1.14 mg/dL (H)). Liver Function Tests: Recent Labs  Lab 10/11/21 1815  AST 14*  ALT 8  ALKPHOS 81  BILITOT 0.4  PROT 5.8*  ALBUMIN 3.4*   No results for input(s): LIPASE, AMYLASE in the last 168 hours. No results for input(s): AMMONIA in the last 168 hours. Coagulation Profile: No results for input(s): INR, PROTIME in the last 168 hours. Cardiac Enzymes: No results for input(s): CKTOTAL, CKMB, CKMBINDEX, TROPONINI in the last 168 hours. BNP (last 3 results) No results for input(s): PROBNP in the last 8760 hours. HbA1C: No results for input(s): HGBA1C in the last 72 hours.  CBG: Recent Labs  Lab 10/14/21 0748 10/14/21 1133 10/14/21 1644 10/14/21 2116 10/15/21 0731  GLUCAP 164* 194* 165* 203* 174*   Lipid Profile: No results for input(s): CHOL, HDL, LDLCALC, TRIG, CHOLHDL, LDLDIRECT in the last 72 hours. Thyroid Function  Tests: No results for input(s): TSH, T4TOTAL, FREET4, T3FREE, THYROIDAB in the last 72 hours. Anemia Panel: No results for input(s): VITAMINB12, FOLATE, FERRITIN, TIBC, IRON, RETICCTPCT in the last 72 hours.  Sepsis Labs: No results for input(s): PROCALCITON, LATICACIDVEN in the last 168 hours.  Recent Results (from the past 240 hour(s))  Resp Panel by RT-PCR (Flu A&B, Covid) Nasopharyngeal Swab     Status: None   Collection Time: 10/11/21 10:19 PM   Specimen: Nasopharyngeal Swab; Nasopharyngeal(NP) swabs in vial transport medium  Result Value Ref Range Status   SARS Coronavirus 2 by RT PCR NEGATIVE NEGATIVE Final    Comment: (NOTE) SARS-CoV-2 target nucleic acids are NOT DETECTED.  The SARS-CoV-2 RNA is generally detectable in upper respiratory specimens during the acute phase of infection. The lowest concentration of SARS-CoV-2 viral copies this assay can detect is 138 copies/mL. A negative result does not preclude SARS-Cov-2 infection and should not be used as the sole basis for treatment or other patient management decisions. A negative result may occur with  improper specimen collection/handling, submission of specimen other than nasopharyngeal swab, presence of viral mutation(s) within the areas targeted by this assay, and inadequate number of viral copies(<138 copies/mL). A negative result  must be combined with clinical observations, patient history, and epidemiological information. The expected result is Negative.  Fact Sheet for Patients:  EntrepreneurPulse.com.au  Fact Sheet for Healthcare Providers:  IncredibleEmployment.be  This test is no t yet approved or cleared by the [place] FDA and  has been authorized for detection and/or diagnosis of SARS-CoV-2 by FDA under an Emergency Use Authorization (EUA). This EUA will remain  in effect (meaning this test can be used) for the duration of the COVID-19 declaration under Section  564(b)(1) of the Act, 21 U.S.C.section 360bbb-3(b)(1), unless the authorization is terminated  or revoked sooner.       Influenza A by PCR NEGATIVE NEGATIVE Final   Influenza B by PCR NEGATIVE NEGATIVE Final    Comment: (NOTE) The Xpert Xpress SARS-CoV-2/FLU/RSV plus assay is intended as an aid in the diagnosis of influenza from Nasopharyngeal swab specimens and should not be used as a sole basis for treatment. Nasal washings and aspirates are unacceptable for Xpert Xpress SARS-CoV-2/FLU/RSV testing.  Fact Sheet for Patients: EntrepreneurPulse.com.au  Fact Sheet for Healthcare Providers: IncredibleEmployment.be  This test is not yet approved or cleared by the [place] FDA and has been authorized for detection and/or diagnosis of SARS-CoV-2 by FDA under an Emergency Use Authorization (EUA). This EUA will remain in effect (meaning this test can be used) for the duration of the COVID-19 declaration under Section 564(b)(1) of the Act, 21 U.S.C. section 360bbb-3(b)(1), unless the authorization is terminated or revoked.  Performed at [facility], [place] [address], [place], [place] [address]      Scheduled Meds:  amLODipine  5 mg Oral Daily   carbidopa-levodopa  1.5 tablet Oral QHS   hydrALAZINE  50 mg Oral TID   insulin aspart  0-15 Units Subcutaneous TID WC   insulin aspart  0-5 Units Subcutaneous QHS   insulin glargine-yfgn  10 Units Subcutaneous Daily   isosorbide mononitrate  60 mg Oral Daily   levothyroxine  100 mcg Oral QAC breakfast   metoprolol succinate  25 mg Oral Daily   nystatin   Topical TID   pregabalin  50 mg Oral BID   senna-docusate  1 tablet Oral BID   Continuous Infusions:   LOS: 3 days    Time spent: 69min   Domenic Polite, MD [facility] Available via Epic secure chat 7am-7pm 10/15/2021, 10:20 AM

## 2021-10-16 ENCOUNTER — Other Ambulatory Visit (HOSPITAL_COMMUNITY): Payer: Self-pay

## 2021-10-16 ENCOUNTER — Encounter (HOSPITAL_COMMUNITY)
Admission: EM | Disposition: A | Discharge status: Skilled Nursing Facility | Payer: Self-pay | Source: Home / Self Care | Attending: Internal Medicine

## 2021-10-16 DIAGNOSIS — S82891A Other fracture of right lower leg, initial encounter for closed fracture: Secondary | ICD-10-CM

## 2021-10-16 DIAGNOSIS — S82899A Other fracture of unspecified lower leg, initial encounter for closed fracture: Secondary | ICD-10-CM | POA: Diagnosis not present

## 2021-10-16 DIAGNOSIS — I10 Essential (primary) hypertension: Secondary | ICD-10-CM | POA: Diagnosis not present

## 2021-10-16 DIAGNOSIS — I739 Peripheral vascular disease, unspecified: Secondary | ICD-10-CM

## 2021-10-16 DIAGNOSIS — S82892A Other fracture of left lower leg, initial encounter for closed fracture: Secondary | ICD-10-CM

## 2021-10-16 LAB — GLUCOSE, CAPILLARY
Glucose-Capillary: 218 mg/dL — ABNORMAL HIGH (ref 70–99)
Glucose-Capillary: 209 mg/dL — ABNORMAL HIGH (ref 70–99)
Glucose-Capillary: 205 mg/dL — ABNORMAL HIGH (ref 70–99)
Glucose-Capillary: 192 mg/dL — ABNORMAL HIGH (ref 70–99)
Glucose-Capillary: 179 mg/dL — ABNORMAL HIGH (ref 70–99)

## 2021-10-16 SURGERY — OPEN REDUCTION INTERNAL FIXATION (ORIF) ANKLE FRACTURE
Anesthesia: General | Site: Ankle | Laterality: Left

## 2021-10-16 MED ORDER — APIXABAN 5 MG PO TABS
5.0000 mg | ORAL_TABLET | Freq: Two times a day (BID) | ORAL | Status: DC
  Administered 2021-10-16 – 2021-10-21 (×11): 5 mg via ORAL
  Filled 2021-10-16 (×11): qty 1

## 2021-10-16 NOTE — Progress Notes (Signed)
PROGRESS NOTE    Sierra Wright  FUX:[id number] DOB: 1934-07-08 DOA: 10/11/2021 PCP: [name], MD    Brief Narrative:  Sierra Wright is an 87/F with history of CAD/CABG, chronic diastolic CHF, type 2 diabetes mellitus, CVA, hypertension, hypothyroidism, osteoarthritis presented to [name], ED on 11/9 from her PCPs office following a mechanical fall with history of pain to both ankles, reports that her legs gave out prior to her fall, denied any loss of consciousness. -In the emergency room vital signs were stable, labs noted creatinine of 2.2 pelvic x-rays were unremarkable, right ankle x-ray with bimalleolar acute displaced fracture, cortical irregularity medial talus -Left ankle x-ray with indeterminate nondisplaced distal fibular fracture with lateral ankle subcutaneous soft tissue edema.  EDP attempted manual reduction right ankle but was unsuccessful.  Orthopedics was consulted.,  Aspirin and Plavix were held she was transferred to [facility] for surgery early next week -11/11 noted to be in A. Fib/atrial flutter  Assessment & Plan:   Bilateral ankle fractures -Following mechanical fall, unfortunately sustained right ankle bimalleolar displaced fracture and nondisplaced distal fibular fracture on the left  -Orthopedics consulting, aspirin and Plavix were held, initial plan was for ORIF this week -Seen by Dr. Lajoyce Corners today, felt risk of surgery was too high in the setting of peripheral vascular disease and severe osteoporosis, recommended nonsurgical management, nonweightbearing on both lower extremities for 3 weeks, may apply pressure through both lower extremities for transfers only, recommended follow-up in 2 weeks, continue splint for now -[facility] consult, will need short-term SNF -Start anticoagulation  Atrial flutter/A. Fib -Detected on telemetry from 11/11 heart rate controlled, continue Lopressor -Heart rate controlled, remains in sinus rhythm now -chads2vasc score >3 -will  start eliquis  Acute renal failure Creatinine on admission elevated 2.21, etiology likely secondary to prerenal from poor oral intake/dehydration Baseline creatinine 1.2.   --Cr 2.21>1.45> 1.1 -Improved, fluids discontinued  CAD s/p CABG Chronic diastolic CHF --Holding aspirin/Plavix -Continue metoprolol -Lasix and benazepril on hold -Discontinue aspirin, Plavix while on Eliquis  Essential hypertension -Blood pressure slightly elevated, continue metoprolol, amlodipine, hydralazine and Imdur  -Blood pressure elevated, increased hydralazine and Imdur dose --discontinued IV fluids  Type 2 diabetes mellitus Hemoglobin A1c 6.5, well controlled.  Home regimen includes glimepiride 1 mg p.o. daily, Lantus 26 units subcutaneously daily. --Semglee 10 units [place] daily --moderate SSI for coverage --CBGs qAC/HS  Hypothyroidism --Levothyroxine 100 mcg p.o. daily  Hx CVA --Holding aspirin and Plavix as above.  Allergic to statin.  RLS: Continue Lyrica and Sinemet   DVT prophylaxis: Eliquis 11/14   Code Status: Full Code Family Communication: No family at bedside, called and updated daughter  Disposition Plan: Will likely need short-term rehab.  Remains inpatient appropriate because: Severity of illness  Consultants:  Orthopedics, Dr. Roda Shutters  Procedures:  None  Subjective: -Feels a little better overall, continues to have discomfort in both ankles especially on the right  Objective: Vitals:   10/15/21 1546 10/15/21 2202 10/16/21 0422 10/16/21 1000  BP: (!) 148/50 (!) 160/63 (!) 151/54 (!) 150/43  Pulse: 63 (!) 54 60 (!) 52  Resp: 17 19 19 18   Temp: 97.9 F (36.6 C) 98.1 F (36.7 C) 97.9 F (36.6 C) 97.9 F (36.6 C)  TempSrc: Oral Oral Oral Oral  SpO2: 95% 93% 97% 94%  Weight:      Height:        Intake/Output Summary (Last 24 hours) at 10/16/2021 1131 Last data filed at 10/16/2021 0429 Gross per 24 hour  Intake 600 ml  Output 200 ml  Net 400 ml   Filed Weights    10/11/21 1805  Weight: 95.3 kg    Examination:  Gen: Pleasant elderly female sitting up in bed, AAOx3, no distress HEENT: No JVD CVS: S1-S2, regular rate rhythm Lungs: Decreased breath sounds to bases Abdomen: Soft, nontender, bowel sounds present Extremities:  Bilateral lower extremities/ankles with splint in place, otherwise moves all extremities independently, neurovascular intact Skin: No rashes on exposed skin Psychiatry: Mood & affect appropriate.     Data Reviewed: I have personally reviewed following labs and imaging studies  CBC: Recent Labs  Lab 10/11/21 1815 10/12/21 0505 10/13/21 0324 10/14/21 0339 10/15/21 0140  WBC 10.8* 13.5* 11.3* 12.0* 11.5*  NEUTROABS 8.3*  --   --   --   --   HGB 10.4* 13.7 12.0 11.8* 10.7*  HCT 32.4* 42.3 35.6* 37.2 32.9*  MCV 99.1 97.5 95.7 97.4 96.5  PLT 152 175 133* 160 0000000   Basic Metabolic Panel: Recent Labs  Lab 10/11/21 1815 10/12/21 0505 10/13/21 0324 10/14/21 0339 10/15/21 0140  NA 138 140 133* 132* 132*  K 4.9 4.9 4.4 4.3 4.4  CL 108 109 106 103 104  CO2 25 25 20* 22 22  GLUCOSE 208* 135* 173* 161* 133*  BUN 38* 35* 23 19 25*  CREATININE 2.21* 1.45* 1.14* 1.13* 1.14*  CALCIUM 8.3* 8.7* 8.1* 8.3* 8.0*  MG  --   --  1.6*  --   --    GFR: Estimated Creatinine Clearance: 42 mL/min (A) (by C-G formula based on SCr of 1.14 mg/dL (H)). Liver Function Tests: Recent Labs  Lab 10/11/21 1815  AST 14*  ALT 8  ALKPHOS 81  BILITOT 0.4  PROT 5.8*  ALBUMIN 3.4*   No results for input(s): LIPASE, AMYLASE in the last 168 hours. No results for input(s): AMMONIA in the last 168 hours. Coagulation Profile: No results for input(s): INR, PROTIME in the last 168 hours. Cardiac Enzymes: No results for input(s): CKTOTAL, CKMB, CKMBINDEX, TROPONINI in the last 168 hours. BNP (last 3 results) No results for input(s): PROBNP in the last 8760 hours. HbA1C: No results for input(s): HGBA1C in the last 72 hours.  CBG: Recent  Labs  Lab 10/15/21 0731 10/15/21 1259 10/15/21 1608 10/15/21 2208 10/16/21 0742  GLUCAP 174* 290* 230* 218* 209*   Lipid Profile: No results for input(s): CHOL, HDL, LDLCALC, TRIG, CHOLHDL, LDLDIRECT in the last 72 hours. Thyroid Function Tests: No results for input(s): TSH, T4TOTAL, FREET4, T3FREE, THYROIDAB in the last 72 hours. Anemia Panel: No results for input(s): VITAMINB12, FOLATE, FERRITIN, TIBC, IRON, RETICCTPCT in the last 72 hours.  Sepsis Labs: No results for input(s): PROCALCITON, LATICACIDVEN in the last 168 hours.  Recent Results (from the past 240 hour(s))  Resp Panel by RT-PCR (Flu A&B, Covid) Nasopharyngeal Swab     Status: None   Collection Time: 10/11/21 10:19 PM   Specimen: Nasopharyngeal Swab; Nasopharyngeal(NP) swabs in vial transport medium  Result Value Ref Range Status   SARS Coronavirus 2 by RT PCR NEGATIVE NEGATIVE Final    Comment: (NOTE) SARS-CoV-2 target nucleic acids are NOT DETECTED.  The SARS-CoV-2 RNA is generally detectable in upper respiratory specimens during the acute phase of infection. The lowest concentration of SARS-CoV-2 viral copies this assay can detect is 138 copies/mL. A negative result does not preclude SARS-Cov-2 infection and should not be used as the sole basis for treatment or other patient management decisions. A negative result  may occur with  improper specimen collection/handling, submission of specimen other than nasopharyngeal swab, presence of viral mutation(s) within the areas targeted by this assay, and inadequate number of viral copies(<138 copies/mL). A negative result must be combined with clinical observations, patient history, and epidemiological information. The expected result is Negative.  Fact Sheet for Patients:  EntrepreneurPulse.com.au  Fact Sheet for Healthcare Providers:  IncredibleEmployment.be  This test is no t yet approved or cleared by the [place] FDA  and  has been authorized for detection and/or diagnosis of SARS-CoV-2 by FDA under an Emergency Use Authorization (EUA). This EUA will remain  in effect (meaning this test can be used) for the duration of the COVID-19 declaration under Section 564(b)(1) of the Act, 21 U.S.C.section 360bbb-3(b)(1), unless the authorization is terminated  or revoked sooner.       Influenza A by PCR NEGATIVE NEGATIVE Final   Influenza B by PCR NEGATIVE NEGATIVE Final    Comment: (NOTE) The Xpert Xpress SARS-CoV-2/FLU/RSV plus assay is intended as an aid in the diagnosis of influenza from Nasopharyngeal swab specimens and should not be used as a sole basis for treatment. Nasal washings and aspirates are unacceptable for Xpert Xpress SARS-CoV-2/FLU/RSV testing.  Fact Sheet for Patients: EntrepreneurPulse.com.au  Fact Sheet for Healthcare Providers: IncredibleEmployment.be  This test is not yet approved or cleared by the [place] FDA and has been authorized for detection and/or diagnosis of SARS-CoV-2 by FDA under an Emergency Use Authorization (EUA). This EUA will remain in effect (meaning this test can be used) for the duration of the COVID-19 declaration under Section 564(b)(1) of the Act, 21 U.S.C. section 360bbb-3(b)(1), unless the authorization is terminated or revoked.  Performed at [facility], [place] [address] [place], [place] [address]      Scheduled Meds:  amLODipine  5 mg Oral Daily   carbidopa-levodopa  1.5 tablet Oral QHS   hydrALAZINE  50 mg Oral TID   insulin aspart  0-15 Units Subcutaneous TID WC   insulin aspart  0-5 Units Subcutaneous QHS   insulin glargine-yfgn  10 Units Subcutaneous Daily   isosorbide mononitrate  60 mg Oral Daily   levothyroxine  100 mcg Oral QAC breakfast   metoprolol succinate  25 mg Oral Daily   nystatin   Topical TID   pregabalin  50 mg Oral BID   senna-docusate  1 tablet Oral BID    Continuous Infusions:   LOS: 4 days    Time spent: 25min   Domenic Polite, MD [facility] Available via Epic secure chat 7am-7pm 10/16/2021, 11:31 AM

## 2021-10-16 NOTE — Plan of Care (Signed)

## 2021-10-16 NOTE — TOC Benefit Eligibility Note (Signed)
Patient Advocate Encounter ° °Insurance verification completed.   ° °The patient is currently admitted and upon discharge could be taking Eliquis 5 mg. ° °The current 30 day co-pay is, $47.00.  ° °The patient is insured through [employer] Medicare Part D  ° ° ° °Krystal Teachey, CPhT °Pharmacy Patient Advocate Specialist ° Pharmacy Patient Advocate Team °Direct Number: [phone]  Fax: [phone] ° ° ° ° ° °  °

## 2021-10-16 NOTE — Care Management Important Message (Signed)
Important Message  Patient Details  Name: Sierra Wright MRN: [id number] Date of Birth: 09/11/1934   Medicare Important Message Given:  Yes     [name] 10/16/2021, 10:56 AM

## 2021-10-16 NOTE — Consult Note (Signed)
ORTHOPAEDIC CONSULTATION  REQUESTING PHYSICIAN: Domenic Polite, MD  Chief Complaint: Bilateral ankle fractures.  HPI: Sierra Wright is a 87 y.o. female who presents with bilateral ankle fractures.  Patient was being seen for arthritis of her left hip when she fell in the doctor's office.  Past medical history significant for diabetes hypertension status post coronary artery bypass surgery with peripheral vascular disease.  Past Medical History:  Diagnosis Date   Arthritis    Coronary artery disease 1999   CABG   Depression    Diabetes mellitus without complication ([place])    type II; metformin and Amaryl   Diabetic neuropathy ([place])    History of anemia as a child    HOH (hard of hearing)    Hypertension    Hypothyroidism    synthroid   PONV (postoperative nausea and vomiting)    Restless leg syndrome    S/P cardiac cath 08/13/18 stable CAD 08/14/2018   Shortness of breath 09/03/12   ECHO- The LA is Moderately Dilated, mild mitral annular calcification. mild pulmonary hypertension, moderate concentric LV hypertrophy. Atrial septum is aneurysmal. Aortic valve appears to be mildly sclerotic.EF 88%   Urinary incontinence    Weakness of both legs    Past Surgical History:  Procedure Laterality Date   ABDOMINAL HYSTERECTOMY     APPENDECTOMY     BACK SURGERY     CARDIAC CATHERIZATION  08/20/03   Widely patent bypass grafts. Normal left ventricular systolic function.   CARDIAC CATHETERIZATION     CHOLECYSTECTOMY     COLONOSCOPY     CORONARY ARTERY BYPASS GRAFT     EYE SURGERY     bilateral cataracts   LEFT HEART CATH AND CORONARY ANGIOGRAPHY N/A 08/13/2018   Procedure: LEFT HEART CATH AND CORONARY ANGIOGRAPHY;  Surgeon: Burnell Blanks, MD;  Location: [facility];  Service: Cardiovascular;  Laterality: N/A;   LUMBAR LAMINECTOMY/DECOMPRESSION MICRODISCECTOMY N/A 07/26/2015   Procedure: BILATERAL Lumbar 3-4 SUBTOTAL HEMILAMINECTOMY WITH LATERAL RECESS  DECOMPRESSION;  Surgeon: Jessy Oto, MD;  Location: [place];  Service: Orthopedics;  Laterality: N/A;   SHOULDER SURGERY     TUBAL LIGATION     Social History   Socioeconomic History   Marital status: Married    Spouse name: Not on file   Number of children: Not on file   Years of education: Not on file   Highest education level: Not on file  Occupational History   Not on file  Tobacco Use   Smoking status: Never    Passive exposure: Never   Smokeless tobacco: Never  Vaping Use   Vaping Use: Never used  Substance and Sexual Activity   Alcohol use: No   Drug use: No   Sexual activity: Not Currently  Other Topics Concern   Not on file  Social History Narrative   Lives with husband in a 1 story home.  Has 1 daughter.   Education: high school.     Worked as a Agricultural engineer.    Right Handed   Social Determinants of Health   Financial Resource Strain: Not on file  Food Insecurity: Not on file  Transportation Needs: Not on file  Physical Activity: Not on file  Stress: Not on file  Social Connections: Not on file   Family History  Problem Relation Age of Onset   Other Mother    Pneumonia Mother    Heart attack Father    Liver cancer Sister    Stroke Sister  Heart attack Brother 25   Stroke Son 20   Healthy Daughter    - negative except otherwise stated in the family history section Allergies  Allergen Reactions   Duloxetine Hcl Nausea And Vomiting and Other (See Comments)   Statins Other (See Comments)    Causes myalgias Other reaction(s): Unknown   Amitriptyline Other (See Comments)    Pt. States that it caused it to be suicidal.    Benadryl [Diphenhydramine Hcl (Sleep)] Other (See Comments)    Makes the patient very "hyper"   Ezetimibe     Other reaction(s): Unknown Other reaction(s): Unknown   Metformin Hcl     Other reaction(s): Unknown Other reaction(s): Unknown   Other     Other reaction(s): Unknown   Sitagliptin     Other reaction(s):  Unknown Other reaction(s): Unknown   Tylenol [Acetaminophen] Nausea Only   Colesevelam Hcl Other (See Comments)    Causes dizziness Other reaction(s): Unknown   Prior to Admission medications   Medication Sig Start Date End Date Taking? Authorizing Provider  amLODipine (NORVASC) 5 MG tablet Take 1 tablet by mouth once daily Patient taking differently: Take 5 mg by mouth daily. 04/11/21  Yes [name], Deloris Ping, MD  Ascorbic Acid (VITAMIN C) 1000 MG tablet Take 1,000 mg by mouth daily.   Yes [provider]  aspirin EC 81 MG EC tablet Take 1 tablet (81 mg total) by mouth daily. 08/15/18  Yes Leone Brand, NP  benazepril (LOTENSIN) 40 MG tablet Take 1 tablet by mouth once daily Patient taking differently: Take 40 mg by mouth daily. 07/24/21  Yes [name], Deloris Ping, MD  calcium carbonate (OS-CAL - DOSED IN MG OF ELEMENTAL CALCIUM) 1250 (500 Ca) MG tablet Take 1 tablet by mouth daily.   Yes [provider]  carbidopa-levodopa (SINEMET IR) 25-100 MG tablet Take 1.5 tablets by mouth at bedtime. 12/16/20  Yes Patel, Donika K, DO  cholecalciferol (VITAMIN D3) 25 MCG (1000 UNIT) tablet Take 1,000 Units by mouth daily.   Yes [provider]  clopidogrel (PLAVIX) 75 MG tablet Take 1 tablet by mouth once daily Patient taking differently: Take 75 mg by mouth daily. 07/24/21  Yes [name], Deloris Ping, MD  diazepam (VALIUM) 10 MG tablet Take 10 mg by mouth daily as needed for anxiety.   Yes [provider]  furosemide (LASIX) 40 MG tablet Take 1 tablet by mouth twice daily Patient taking differently: Take 40 mg by mouth 2 (two) times daily. 07/24/21  Yes Bhagat, Bhavinkumar, PA  Garlic 1000 MG CAPS Take 1,000 mg by mouth daily.    Yes [provider]  glimepiride (AMARYL) 1 MG tablet Take 1 mg by mouth daily with breakfast.   Yes [provider]  hydrALAZINE (APRESOLINE) 25 MG tablet TAKE 1 TABLET BY MOUTH THREE TIMES DAILY Patient taking differently: Take 25 mg  by mouth 2 (two) times daily. 11/22/20  Yes [name], Deloris Ping, MD  isosorbide mononitrate (IMDUR) 30 MG 24 hr tablet Take 1 tablet by mouth once daily Patient taking differently: Take 30 mg by mouth daily. 07/10/21  Yes [name], Deloris Ping, MD  LANTUS SOLOSTAR 100 UNIT/ML Solostar Pen Inject 26 Units into the skin daily. 07/10/21  Yes [provider]  levothyroxine (SYNTHROID, LEVOTHROID) 100 MCG tablet Take 100 mcg by mouth daily before breakfast.   Yes [provider]  metoprolol succinate (TOPROL-XL) 25 MG 24 hr tablet Take 1 tablet by mouth once daily Patient taking differently: Take 25 mg  by mouth daily. 06/07/21  Yes [name], [name], MD  nitroGLYCERIN (NITROSTAT) 0.4 MG SL tablet DISSOLVE ONE TABLET UNDER THE TONGUE EVERY 5 MINUTES AS NEEDED FOR CHEST PAIN. Patient taking differently: Place 0.4 mg under the tongue every 5 (five) minutes as needed for chest pain. 04/19/21  Yes [name], [name], MD  ondansetron (ZOFRAN) 4 MG tablet Take 4 mg by mouth every 6 (six) hours as needed for nausea. 08/20/20  Yes [provider]  oxyCODONE (ROXICODONE) 5 MG immediate release tablet Take 1 tablet (5 mg total) by mouth every 6 (six) hours as needed for severe pain. 10/11/21  Yes Aundra Dubin, PA-C  potassium chloride (K-DUR,KLOR-CON) 10 MEQ tablet Take 10 mEq by mouth 2 (two) times daily.   Yes [provider]  pregabalin (LYRICA) 50 MG capsule Take 50 mg by mouth 2 (two) times daily.   Yes [provider]  vitamin B-12 (CYANOCOBALAMIN) 1000 MCG tablet Take 1,000 mcg by mouth daily.   Yes [provider]  BD PEN NEEDLE NANO 2ND GEN 32G X 4 MM MISC daily. use as directed 01/17/21   [provider]  gabapentin (NEURONTIN) 100 MG capsule Take 1 capsule (100 mg total) by mouth 3 (three) times daily. Patient not taking: No sig reported 12/16/20   Narda Amber K, DO  HYDROcodone-acetaminophen (NORCO) 5-325 MG tablet Take 1 tablet by mouth every 6 (six) hours  as needed. Patient not taking: No sig reported 09/27/21   Leandrew Koyanagi, MD  Insulin Pen Needle (BD PEN NEEDLE NANO 2ND GEN) 32G X 4 MM MISC See admin instructions.    [provider]  Lancets [facility] DELICA PLUS [id number]) MISC Apply topically 2 (two) times daily. 01/17/21   [provider]  Roma Schanz test strip SMARTSIG:Strip(s) Via Meter Twice Daily 01/17/21   [provider]   CT ANKLE LEFT WO CONTRAST  Result Date: 10/15/2021 CLINICAL DATA:  Fracture, ankle EXAM: CT OF THE LEFT ANKLE WITHOUT CONTRAST TECHNIQUE: Multidetector CT imaging of the left ankle was performed according to the standard protocol. Multiplanar CT image reconstructions were also generated. COMPARISON:  Radiograph 10/11/2021 FINDINGS: Bones/Joint/Cartilage There is a minimally displaced distal fibular fracture, with 2 mm posterior displacement. There is an anterolateral distal tibia fracture consistent with an anterior inferior tibiofibular ligament avulsion. Diffuse osteopenia. No other additional fracture. There is no significant tibiotalar osteoarthritis. There is mild talonavicular and calcaneocuboid osteoarthritis. Ligaments Suboptimally assessed by CT. Muscles and Tendons And no evidence of tendon entrapment.  Generalized muscle atrophy. Soft tissues Lateral ankle soft tissue swelling. IMPRESSION: Minimally displaced distal fibula fracture. Anterior inferior tibiofibular ligament avulsion fracture of the distal tibia. Electronically Signed   By: Maurine Simmering M.D.   On: 10/15/2021 17:03   - pertinent xrays, CT, MRI studies were reviewed and independently interpreted  Positive ROS: All other systems have been reviewed and were otherwise negative with the exception of those mentioned in the HPI and as above.  Physical Exam: General: Alert, no acute distress Psychiatric: Patient is competent for consent with normal mood and affect Lymphatic: No axillary or cervical  lymphadenopathy Cardiovascular: No pedal edema Respiratory: No cyanosis, no use of accessory musculature GI: No organomegaly, abdomen is soft and non-tender    Images:  @ENCIMAGES @  Labs:  Lab Results  Component Value Date   HGBA1C 6.5 (H) 10/12/2021   HGBA1C 6.5 (H) 07/15/2016   HGBA1C 6.9 (H) 07/26/2015   REPTSTATUS 07/15/2016 FINAL 07/14/2016   CULT MULTIPLE SPECIES  PRESENT, SUGGEST RECOLLECTION (A) 07/14/2016    Lab Results  Component Value Date   ALBUMIN 3.4 (L) 10/11/2021   ALBUMIN 3.9 08/11/2018   ALBUMIN 4.4 08/11/2018     CBC EXTENDED Latest Ref Rng & Units 10/15/2021 10/14/2021 10/13/2021  WBC 4.0 - 10.5 K/uL 11.5(H) 12.0(H) 11.3(H)  RBC 3.87 - 5.11 MIL/uL 3.41(L) 3.82(L) 3.72(L)  HGB 12.0 - 15.0 g/dL 10.7(L) 11.8(L) 12.0  HCT 36.0 - 46.0 % 32.9(L) 37.2 35.6(L)  PLT 150 - 400 K/uL 158 160 133(L)  NEUTROABS 1.7 - 7.7 K/uL - - -  LYMPHSABS 0.7 - 4.0 K/uL - - -    Neurologic: Patient does not have protective sensation bilateral lower extremities.   MUSCULOSKELETAL:   Skin: Examination patient has thin atrophic skin her both ankles or wrapped in splints.  Review of the CT scan shows a congruent mortise on the left with a trimalleolar fracture on the right with a congruent mortise as well.  Review of her ankle-brachial indices shows calcified vessels with diminished pulses at the great toe  Patient is on anticoagulation therapy for her heart.  Assessment: Assessment: Bilateral ankle fractures with multiple medical problems with congruent ankle joints bilaterally.  Plan: Plan: Would recommend discharge to skilled nursing nonweightbearing on both lower extremities for 3 weeks.  She may apply pressure through both lower extremities for transfers only.  Anticipated 3 weeks with repeat radiographs and initiate weightbearing either in a cast or fracture boot.  I will follow-up in the office in 2 weeks.  Thank you for the consult and the opportunity to see Sierra Wright, [place] [phone] 6:40 AM

## 2021-10-16 NOTE — Progress Notes (Signed)
PT Cancellation Note  Patient Details Name: Sierra Wright MRN: [id number] DOB: 12/25/1933   Cancelled Treatment:    Reason Eval/Treat Not Completed: Pain limiting ability to participate.  Recently medicated and will return in a short time to see if meds are more effective.  Pt is expecting nonsurgical management now.   Ivar Drape 10/16/2021, 1:27 PM  Samul Dada, PT PhD Acute Rehab Dept. Number: [facility] [id number] and [facility] [phone]

## 2021-10-16 NOTE — [place] FL2 (Signed)
[place] MEDICAID FL2 LEVEL OF CARE SCREENING TOOL     IDENTIFICATION  Patient Name: Sierra Wright Birthdate: 09/14/1934 Sex: female Admission Date (Current Location): 10/11/2021  [facility] and [place] Number:  Producer, television/film/video and Address:  [facility], [address] [place], [place] [address]      Provider Number:    Attending Physician Name and Address:  Zannie Cove, MD  Relative Name and Phone Number:  Baylie, Drakes (Spouse)   [phone]    Current Level of Care: Hospital Recommended Level of Care: Skilled Nursing Facility Prior Approval Number:    Date Approved/Denied:   PASRR Number: [id number]  Discharge Plan: SNF    Current Diagnoses: Patient Active Problem List   Diagnosis Date Noted   PVD (peripheral vascular disease) (HCC)    Ankle fracture 10/12/2021   AKI (acute kidney injury) (HCC) 10/12/2021   Anemia 10/12/2021   Diabetic neuropathy (HCC) 01/25/2021   Diabetic retinopathy (HCC) 01/25/2021   Hyperglycemia due to type 2 diabetes mellitus (HCC) 01/25/2021   Unspecified mononeuropathy of right lower limb 01/25/2021   S/P cardiac cath 08/13/18 stable CAD 08/14/2018   Orthopnea 08/12/2018   Chest pain 09/01/2016   Diabetic polyneuropathy associated with diabetes mellitus due to underlying condition (HCC) 07/26/2016   Chronic daily headache 07/26/2016   RLS (restless legs syndrome) 07/26/2016   Ulnar neuropathy of right upper extremity 07/26/2016   Ischemic stroke (HCC) 07/26/2016   CVA (cerebral infarction): Early subacute 07/14/2016   Back pain 07/14/2016   Essential hypertension    2nd degree AV block    Thyroid activity decreased    Coronary artery disease involving coronary bypass graft of native heart without angina pectoris    Binocular vision disorder with diplopia 07/13/2016   Weakness 07/13/2016   Chronic diastolic CHF (congestive heart failure) (HCC) 04/16/2016   Osteoporosis 07/27/2015   Spinal  stenosis, lumbar region, with neurogenic claudication 07/26/2015   Spondylolisthesis of lumbar region 07/26/2015   Hx of CABG 03/15/2014   Osteoarthritis 03/15/2014   DM type 2 goal A1C below 7.5 03/15/2014   Peripheral edema 03/15/2014   Benign hypertensive heart disease without heart failure 03/15/2014   Right bundle branch block 03/15/2014   First degree AV block 03/15/2014   Sinus bradycardia by electrocardiogram 03/15/2014   Dizziness 03/15/2014    Orientation RESPIRATION BLADDER Height & Weight     Place, Situation, Self, Time  O2 (Nasal cannula) Incontinent Weight: 210 lb (95.3 kg) Height:  5\' 6"  (167.6 cm)  BEHAVIORAL SYMPTOMS/MOOD NEUROLOGICAL BOWEL NUTRITION STATUS      Continent Diet (see d/c summary)  AMBULATORY STATUS COMMUNICATION OF NEEDS Skin   Extensive Assist Verbally Normal                       Personal Care Assistance Level of Assistance  Bathing, Feeding, Dressing Bathing Assistance: Limited assistance Feeding assistance: Independent Dressing Assistance: Limited assistance     Functional Limitations Info  Sight, Hearing, Speech Sight Info: Adequate Hearing Info: Impaired Speech Info: Adequate    SPECIAL CARE FACTORS FREQUENCY  PT (By licensed PT), OT (By licensed OT)     PT Frequency: 5x/ week OT Frequency: 5x/ week            Contractures      Additional Factors Info  Code Status, Allergies, Insulin Sliding Scale Code Status Info: Full Allergies Info: Duloxetine Hcl   Statins   Amitriptyline   Benadryl (Diphenhydramine  Hcl (Sleep))   Ezetimibe   Metformin Hcl   Other   Sitagliptin   Tylenol (Acetaminophen)   Colesevelam Hcl   Insulin Sliding Scale Info: see d/c med list       Current Medications (10/16/2021):  This is the current hospital active medication list Current Facility-Administered Medications  Medication Dose Route Frequency Provider Last Rate Last Admin   amLODipine (NORVASC) tablet 5 mg  5 mg Oral Daily Shela Leff, MD   5 mg at 10/16/21 0958   apixaban (ELIQUIS) tablet 5 mg  5 mg Oral BID Domenic Polite, MD   5 mg at 10/16/21 1237   carbidopa-levodopa (SINEMET IR) 25-100 MG per tablet immediate release 1.5 tablet  1.5 tablet Oral QHS Shela Leff, MD   1.5 tablet at 10/15/21 2208   hydrALAZINE (APRESOLINE) tablet 50 mg  50 mg Oral TID Domenic Polite, MD   50 mg at 10/16/21 1711   insulin aspart (novoLOG) injection 0-15 Units  0-15 Units Subcutaneous TID WC [place] Eric J, DO   3 Units at 10/16/21 1711   insulin aspart (novoLOG) injection 0-5 Units  0-5 Units Subcutaneous QHS [place] Eric J, DO   2 Units at 10/15/21 2213   insulin glargine-yfgn (SEMGLEE) injection 10 Units  10 Units Subcutaneous Daily Shela Leff, MD   10 Units at 10/16/21 0959   isosorbide mononitrate (IMDUR) 24 hr tablet 60 mg  60 mg Oral Daily Domenic Polite, MD   60 mg at 10/16/21 0958   labetalol (NORMODYNE) injection 10 mg  10 mg Intravenous Q4H PRN Domenic Polite, MD       levothyroxine (SYNTHROID) tablet 100 mcg  100 mcg Oral QAC breakfast Shela Leff, MD   100 mcg at 10/16/21 0519   methocarbamol (ROBAXIN) tablet 500 mg  500 mg Oral Q6H PRN [place] Eric J, DO   500 mg at 10/14/21 1156   metoprolol succinate (TOPROL-XL) 24 hr tablet 25 mg  25 mg Oral Daily Shela Leff, MD   25 mg at 10/16/21 0958   morphine 2 MG/ML injection 1 mg  1 mg Intravenous Q4H PRN Shela Leff, MD   1 mg at 10/13/21 1009   nystatin (MYCOSTATIN/NYSTOP) topical powder   Topical TID Kristopher Oppenheim, DO   Given at 10/16/21 1713   ondansetron (ZOFRAN) injection 4 mg  4 mg Intravenous Q6H PRN Kristopher Oppenheim, DO   4 mg at 10/14/21 1326   ondansetron (ZOFRAN) tablet 4 mg  4 mg Oral Q8H PRN Domenic Polite, MD   4 mg at 10/16/21 [id number]   oxyCODONE (Oxy IR/ROXICODONE) immediate release tablet 10 mg  10 mg Oral Q4H PRN Domenic Polite, MD   10 mg at 10/16/21 1237   polyethylene glycol (MIRALAX / GLYCOLAX) packet 17 g  17 g Oral Daily  PRN Domenic Polite, MD   17 g at 10/16/21 0655   pregabalin (LYRICA) capsule 50 mg  50 mg Oral BID Shela Leff, MD   50 mg at 10/16/21 [id number]   senna-docusate (Senokot-S) tablet 1 tablet  1 tablet Oral BID Domenic Polite, MD   1 tablet at 10/16/21 [id number]     Discharge Medications: Please see discharge summary for a list of discharge medications.  Relevant Imaging Results:  Relevant Lab Results:   Additional Information SSN:  [id number];  5'6"  210lbs  Paulene Floor Carmine Youngberg, LCSWA

## 2021-10-16 NOTE — Plan of Care (Signed)

## 2021-10-16 NOTE — Evaluation (Signed)
Physical Therapy Evaluation Patient Details Name: Sierra Wright MRN: [id number] DOB: 12/11/1933 Today's Date: 10/16/2021  History of Present Illness  87 yo female with onset of fall in MD office was brought to hosp on 11/9, now determined to be inappropriate for surgery to do surgery for ankle fractures.  Has trimalleolar fracture on R ankle, L has minimally displaced distal fib fracture, anterior inferior tibiobifular ligament avulsion fracture distal tibia.  NWB orders for now B feet, expected to go to SNF.  PMHx:  a-flutter, a-fib, CAD, CABG, atherosclerosis, DM, HTN, PVD, depression, PN, HOH, SOB, hypothyroidism,  Clinical Impression  Pt was seen for progression of movement to sit up on bed and attempt to scoot with UE's and avoiding WB on B feet.  Pt could not tolerate bringing legs to side of bed so was laterally scooting in bed in long sitting with limited success.  Pain was pt's limitation, and could barely tolerate PT assisting her to move the legs toward EOB.  Follow along with her to get to side of bed, to practice scooting transfers and will defer WB until there is a clarification from MD for the term "pressure" to manage B feet and ankles during transfers.  Pt is motivated and looking forward to getting home from rehab.  Pt has a strong posterior trunk lean in sitting currently.       Recommendations for follow up therapy are one component of a multi-disciplinary discharge planning process, led by the attending physician.  Recommendations may be updated based on patient status, additional functional criteria and insurance authorization.  Follow Up Recommendations Skilled nursing-short term rehab (<3 hours/day)    Assistance Recommended at Discharge Frequent or constant Supervision/Assistance  Functional Status Assessment Patient has had a recent decline in their functional status and/or demonstrates limited ability to make significant improvements in function in a reasonable and  predictable amount of time  Equipment Recommendations  Wheelchair (measurements PT);Wheelchair cushion (measurements PT)    Recommendations for Other Services       Precautions / Restrictions Precautions Precautions: Fall Precaution Comments: NWB for now on B feet Required Braces or Orthoses: Splint/Cast Splint/Cast: B ankles Restrictions Weight Bearing Restrictions: Yes RLE Weight Bearing: Non weight bearing LLE Weight Bearing: Non weight bearing Other Position/Activity Restrictions: awaiting clarification on "pressure" to feet with transfers      Mobility  Bed Mobility Overal bed mobility: Needs Assistance Bed Mobility: Supine to Sit;Sit to Supine     Supine to sit: Mod assist Sit to supine: Mod assist   General bed mobility comments: mod assist to get up in middle of bed but cannot get to side of bed due to leg pain    Transfers Overall transfer level: Needs assistance Equipment used: None Transfers:  (lateral scoot on the bed)             General transfer comment: lateral scooting in sitting on bed with max assist    Ambulation/Gait               General Gait Details: deferred  Stairs            Wheelchair Mobility    Modified Rankin (Stroke Patients Only)       Balance Overall balance assessment: Needs assistance;History of Falls Sitting-balance support: Feet supported;Bilateral upper extremity supported Sitting balance-Leahy Scale: Poor Sitting balance - Comments: poor long sit balance, unable to sit upright on bed and cannot get legs to side of bed due to pain  Pertinent Vitals/Pain Pain Assessment: Faces Faces Pain Scale: Hurts even more Pain Location: B ankles with movement of legs Pain Descriptors / Indicators: Aching;Guarding;Grimacing Pain Intervention(s): Limited activity within patient's tolerance;Monitored during session;Premedicated before session;Repositioned    Home  Living Family/patient expects to be discharged to:: Skilled nursing facility Living Arrangements: Spouse/significant other                 Additional Comments: husband is home with wife,he uses wheelchair for longer trips and [facility] in the house    Prior Function Prior Level of Function : Needs assist;History of Falls (last six months)       Physical Assist : Mobility (physical) Mobility (physical): Gait   Mobility Comments: used wheelchair to get to another chair and transferred with RW ADLs Comments: could manage her own bathing and dressing     Hand Dominance   Dominant Hand: Right    Extremity/Trunk Assessment   Upper Extremity Assessment Upper Extremity Assessment: Overall WFL for tasks assessed    Lower Extremity Assessment Lower Extremity Assessment: RLE deficits/detail;LLE deficits/detail RLE Deficits / Details: short leg splint and ace wrap, unable to stand and in pain RLE: Unable to fully assess due to immobilization;Unable to fully assess due to pain RLE Coordination: decreased gross motor LLE Deficits / Details: short leg splint and ace wrap, unable to stand and in pain LLE: Unable to fully assess due to pain;Unable to fully assess due to immobilization LLE Coordination: decreased gross motor    Cervical / Trunk Assessment Cervical / Trunk Assessment: Kyphotic  Communication   Communication: No difficulties  Cognition Arousal/Alertness: Awake/alert Behavior During Therapy: WFL for tasks assessed/performed Overall Cognitive Status: Within Functional Limits for tasks assessed                                 General Comments: pt is a fairly good historian        General Comments General comments (skin integrity, edema, etc.): pt was assisted to sit on bed long sitting and worked on sliding legs to side of bed but did not complete this    Exercises     Assessment/Plan    PT Assessment Patient needs continued PT services  PT Problem  List Decreased strength;Decreased activity tolerance;Decreased range of motion;Decreased balance;Decreased mobility;Decreased coordination;Decreased skin integrity;Pain       PT Treatment Interventions DME instruction;Functional mobility training;Therapeutic activities;Therapeutic exercise;Balance training;Neuromuscular re-education;Patient/family education    PT Goals (Current goals can be found in the Care Plan section)  Acute Rehab PT Goals Patient Stated Goal: to get home again and able to take care of herself PT Goal Formulation: With patient Time For Goal Achievement: 10/30/21 Potential to Achieve Goals: Good    Frequency Min 3X/week   Barriers to discharge Inaccessible home environment;Decreased caregiver support has a ramp up 5 steps and husband is not able to assist her    Co-evaluation               AM-PAC PT "6 Clicks" Mobility  Outcome Measure Help needed turning from your back to your side while in a flat bed without using bedrails?: A Lot Help needed moving from lying on your back to sitting on the side of a flat bed without using bedrails?: A Lot Help needed moving to and from a bed to a chair (including a wheelchair)?: A Lot Help needed standing up from a chair using your arms (e.g., wheelchair or bedside  chair)?: Total Help needed to walk in hospital room?: Total Help needed climbing 3-5 steps with a railing? : Total 6 Click Score: 9    End of Session Equipment Utilized During Treatment: Oxygen Activity Tolerance: Patient limited by fatigue;Patient limited by pain Patient left: in bed;with call bell/phone within reach;with bed alarm set Nurse Communication: Mobility status;Need for lift equipment;Patient requests pain meds PT Visit Diagnosis: Muscle weakness (generalized) (M62.81);Pain;History of falling (Z91.81) Pain - Right/Left:  (bilateral) Pain - part of body: Ankle and joints of foot    Time: 1445-1505 PT Time Calculation (min) (ACUTE ONLY): 20  min   Charges:   PT Evaluation $PT Eval Moderate Complexity: 1 Mod         Ramond Dial 10/16/2021, 4:32 PM  Mee Hives, PT PhD Acute Rehab Dept. Number: [place] and [place]

## 2021-10-16 NOTE — Progress Notes (Signed)
ANTICOAGULATION CONSULT NOTE  Pharmacy Consult for apixaban Indication: atrial fibrillation  Allergies  Allergen Reactions   Duloxetine Hcl Nausea And Vomiting and Other (See Comments)   Statins Other (See Comments)    Causes myalgias Other reaction(s): Unknown   Amitriptyline Other (See Comments)    Pt. States that it caused it to be suicidal.    Benadryl [Diphenhydramine Hcl (Sleep)] Other (See Comments)    Makes the patient very "hyper"   Ezetimibe     Other reaction(s): Unknown Other reaction(s): Unknown   Metformin Hcl     Other reaction(s): Unknown Other reaction(s): Unknown   Other     Other reaction(s): Unknown   Sitagliptin     Other reaction(s): Unknown Other reaction(s): Unknown   Tylenol [Acetaminophen] Nausea Only   Colesevelam Hcl Other (See Comments)    Causes dizziness Other reaction(s): Unknown    Patient Measurements: Height: 5\' 6"  (167.6 cm) Weight: 95.3 kg (210 lb) IBW/kg (Calculated) : 59.3 Heparin Dosing Weight: 80.5 kg   Vital Signs: Temp: 97.9 F (36.6 C) (11/14 1000) Temp Source: Oral (11/14 1000) BP: 150/43 (11/14 1000) Pulse Rate: 52 (11/14 1000)  Labs: Recent Labs    10/14/21 0339 10/15/21 0140  HGB 11.8* 10.7*  HCT 37.2 32.9*  PLT 160 158  CREATININE 1.13* 1.14*    Estimated Creatinine Clearance: 42 mL/min (A) (by C-G formula based on SCr of 1.14 mg/dL (H)).   Medical History: Past Medical History:  Diagnosis Date   Arthritis    Coronary artery disease 1999   CABG   Depression    Diabetes mellitus without complication (HCC)    type II; metformin and Amaryl   Diabetic neuropathy (HCC)    History of anemia as a child    HOH (hard of hearing)    Hypertension    Hypothyroidism    synthroid   PONV (postoperative nausea and vomiting)    Restless leg syndrome    S/P cardiac cath 08/13/18 stable CAD 08/14/2018   Shortness of breath 09/03/12   ECHO- The LA is Moderately Dilated, mild mitral annular calcification. mild  pulmonary hypertension, moderate concentric LV hypertrophy. Atrial septum is aneurysmal. Aortic valve appears to be mildly sclerotic.EF 88%   Urinary incontinence    Weakness of both legs     Medications:  Scheduled:   amLODipine  5 mg Oral Daily   carbidopa-levodopa  1.5 tablet Oral QHS   hydrALAZINE  50 mg Oral TID   insulin aspart  0-15 Units Subcutaneous TID WC   insulin aspart  0-5 Units Subcutaneous QHS   insulin glargine-yfgn  10 Units Subcutaneous Daily   isosorbide mononitrate  60 mg Oral Daily   levothyroxine  100 mcg Oral QAC breakfast   metoprolol succinate  25 mg Oral Daily   nystatin   Topical TID   pregabalin  50 mg Oral BID   senna-docusate  1 tablet Oral BID    Assessment: 87 yof who had mechanical fall resulting in bilateral ankle fractures - plan for non-surgical management. Was found to have atrial flutter/fib on monitor. CHADsVASc >3. No AC PTA.   Given age>80 but wt>60 kg and Scr<1.5 qualifies for full dose apixaban. No s/sx of bleeding.   Goal of Therapy:  Monitor platelets by anticoagulation protocol: Yes   Plan:  Apixaban 5 mg BID  Monitor daily CBC and s/sx of bleeding   11/03/12, PharmD, BCCCP Clinical Pharmacist  Phone: [phone] 10/16/2021 11:59 AM  Please check AMION for all [facility]  phone numbers After 10:00 PM, call [place] [phone]

## 2021-10-17 DIAGNOSIS — S82892A Other fracture of left lower leg, initial encounter for closed fracture: Secondary | ICD-10-CM | POA: Diagnosis not present

## 2021-10-17 DIAGNOSIS — S82899A Other fracture of unspecified lower leg, initial encounter for closed fracture: Secondary | ICD-10-CM | POA: Diagnosis not present

## 2021-10-17 LAB — BASIC METABOLIC PANEL
Anion gap: 9 (ref 5–15)
Anion gap: 6 (ref 5–15)
BUN: 31 mg/dL — ABNORMAL HIGH (ref 8–23)
BUN: 28 mg/dL — ABNORMAL HIGH (ref 8–23)
CO2: 20 mmol/L — ABNORMAL LOW (ref 22–32)
CO2: 24 mmol/L (ref 22–32)
Calcium: 8.1 mg/dL — ABNORMAL LOW (ref 8.9–10.3)
Calcium: 8.1 mg/dL — ABNORMAL LOW (ref 8.9–10.3)
Chloride: 102 mmol/L (ref 98–111)
Chloride: 101 mmol/L (ref 98–111)
Creatinine, Ser: 1.27 mg/dL — ABNORMAL HIGH (ref 0.44–1.00)
Creatinine, Ser: 1.28 mg/dL — ABNORMAL HIGH (ref 0.44–1.00)
GFR, Estimated: 41 mL/min — ABNORMAL LOW (ref >60)
GFR, Estimated: 41 mL/min — ABNORMAL LOW (ref >60)
Glucose, Bld: 183 mg/dL — ABNORMAL HIGH (ref 70–99)
Glucose, Bld: 193 mg/dL — ABNORMAL HIGH (ref 70–99)
Potassium: 5.3 mmol/L — ABNORMAL HIGH (ref 3.5–5.1)
Potassium: 4.7 mmol/L (ref 3.5–5.1)
Sodium: 131 mmol/L — ABNORMAL LOW (ref 135–145)
Sodium: 131 mmol/L — ABNORMAL LOW (ref 135–145)

## 2021-10-17 LAB — GLUCOSE, CAPILLARY
Glucose-Capillary: 200 mg/dL — ABNORMAL HIGH (ref 70–99)
Glucose-Capillary: 212 mg/dL — ABNORMAL HIGH (ref 70–99)
Glucose-Capillary: 237 mg/dL — ABNORMAL HIGH (ref 70–99)
Glucose-Capillary: 206 mg/dL — ABNORMAL HIGH (ref 70–99)
Glucose-Capillary: 150 mg/dL — ABNORMAL HIGH (ref 70–99)

## 2021-10-17 LAB — CBC
HCT: 34.9 % — ABNORMAL LOW (ref 36.0–46.0)
Hemoglobin: 11.1 g/dL — ABNORMAL LOW (ref 12.0–15.0)
MCH: 31.2 pg (ref 26.0–34.0)
MCHC: 31.8 g/dL (ref 30.0–36.0)
MCV: 98 fL (ref 80.0–100.0)
Platelets: 207 10*3/uL (ref 150–400)
RBC: 3.56 MIL/uL — ABNORMAL LOW (ref 3.87–5.11)
RDW: 14.1 % (ref 11.5–15.5)
WBC: 10.1 10*3/uL (ref 4.0–10.5)
nRBC: 0 % (ref 0.0–0.2)

## 2021-10-17 MED ORDER — SENNOSIDES-DOCUSATE SODIUM 8.6-50 MG PO TABS
2.0000 | ORAL_TABLET | Freq: Two times a day (BID) | ORAL | Status: DC
  Administered 2021-10-17 – 2021-10-21 (×10): 2 via ORAL
  Filled 2021-10-17 (×12): qty 2

## 2021-10-17 MED ORDER — POLYETHYLENE GLYCOL 3350 17 G PO PACK
17.0000 g | PACK | Freq: Two times a day (BID) | ORAL | Status: DC
  Administered 2021-10-17 – 2021-10-21 (×6): 17 g via ORAL
  Filled 2021-10-17 (×9): qty 1

## 2021-10-17 MED ORDER — INSULIN GLARGINE-YFGN 100 UNIT/ML ~~LOC~~ SOLN
13.0000 [IU] | Freq: Every day | SUBCUTANEOUS | Status: DC
Start: 2021-10-18 — End: 2021-10-21
  Administered 2021-10-18 – 2021-10-21 (×4): 13 [IU] via SUBCUTANEOUS
  Filled 2021-10-17 (×4): qty 0.13

## 2021-10-17 MED ORDER — SODIUM ZIRCONIUM CYCLOSILICATE 10 G PO PACK
10.0000 g | PACK | Freq: Once | ORAL | Status: AC
  Administered 2021-10-17: 10 g via ORAL
  Filled 2021-10-17: qty 1

## 2021-10-17 NOTE — Evaluation (Signed)
Occupational Therapy Evaluation Patient Details Name: Sierra Wright MRN: [id number] DOB: June 06, 1934 Today's Date: 10/17/2021   History of Present Illness 87 yo female with onset of fall in MD office was brought to hosp on 11/9, now determined to be inappropriate for surgery to do surgery for ankle fractures.  Has trimalleolar fracture on R ankle, L has minimally displaced distal fib fracture, anterior inferior tibiobifular ligament avulsion fracture distal tibia.  NWB orders for now B feet, expected to go to SNF.  PMHx:  a-flutter, a-fib, CAD, CABG, atherosclerosis, DM, HTN, PVD, depression, PN, HOH, SOB, hypothyroidism,   Clinical Impression   Pt presents with pain and decreased balance, activity tolerance, and strength. Pt currently requiring Min A for UB ADLs, Total A for LB ADLs, and Mod A for bed mobility. Pt is independent at baseline and uses a w/c for most mobility at home. Would benefit from drop arm recliner and BSC (bariatric would likely be best) for lateral scoot transfers at this time due to preferred NWB BLEs per ortho. Recommend SNF for further rehab to improve safety/independence with ADLs and functional transfers/mobility prior to return home. Will follow acutely.     Recommendations for follow up therapy are one component of a multi-disciplinary discharge planning process, led by the attending physician.  Recommendations may be updated based on patient status, additional functional criteria and insurance authorization.   Follow Up Recommendations  Skilled nursing-short term rehab (<3 hours/day)    Assistance Recommended at Discharge Frequent or constant Supervision/Assistance  Functional Status Assessment  Patient has had a recent decline in their functional status and demonstrates the ability to make significant improvements in function in a reasonable and predictable amount of time.  Equipment Recommendations  Other (comment) (Defer)    Recommendations for Other  Services       Precautions / Restrictions Precautions Precautions: Fall Precaution Comments: NWB for now on B feet Required Braces or Orthoses: Splint/Cast Splint/Cast: B ankles Restrictions Weight Bearing Restrictions: Yes RLE Weight Bearing: Non weight bearing LLE Weight Bearing: Non weight bearing      Mobility Bed Mobility Overal bed mobility: Needs Assistance Bed Mobility: Supine to Sit;Sit to Supine     Supine to sit: Mod assist Sit to supine: Mod assist   General bed mobility comments: Assistance to manage LEs, elevate trunk, and bring hips to EOB with pad.    Transfers                   General transfer comment: Deferred      Balance Overall balance assessment: Needs assistance;History of Falls Sitting-balance support: Feet supported;Bilateral upper extremity supported Sitting balance-Leahy Scale: Poor                                     ADL either performed or assessed with clinical judgement   ADL Overall ADL's : Needs assistance/impaired Eating/Feeding: Independent;Sitting   Grooming: Set up;Sitting   Upper Body Bathing: Minimal assistance;Sitting   Lower Body Bathing: Total assistance;Sitting/lateral leans   Upper Body Dressing : Minimal assistance;Sitting   Lower Body Dressing: Total assistance;Sitting/lateral leans       Toileting- Clothing Manipulation and Hygiene: Total assistance;Sitting/lateral lean         General ADL Comments: Limited by pain, weakness, balance, activity toelrance, and NWB BLEs. Would benefit from drop arm recliner and BSC (bariatric would likely be best) for lateral scoot transfers due to preferred  NWB per ortho.     Vision         Perception     Praxis      Pertinent Vitals/Pain Pain Assessment: 0-10 Pain Score: 8  Pain Location: B ankles with movement of legs Pain Descriptors / Indicators: Aching;Guarding;Grimacing Pain Intervention(s): Limited activity within patient's  tolerance;Monitored during session;Repositioned     Hand Dominance Right   Extremity/Trunk Assessment Upper Extremity Assessment Upper Extremity Assessment: Generalized weakness   Lower Extremity Assessment Lower Extremity Assessment: Defer to PT evaluation   Cervical / Trunk Assessment Cervical / Trunk Assessment: Kyphotic   Communication Communication Communication: No difficulties   Cognition Arousal/Alertness: Awake/alert Behavior During Therapy: WFL for tasks assessed/performed Overall Cognitive Status: Within Functional Limits for tasks assessed                                       General Comments  Reported nausea while sitting EOB. SpO2 down to 87% on 3L O2 during bed mobility. Recovered to 96% after rest.    Exercises     Shoulder Instructions      Home Living Family/patient expects to be discharged to:: Skilled nursing facility Living Arrangements: Spouse/significant other                               Additional Comments: husband is home with wife, he uses wheelchair for longer trips and [facility] in the house      Prior Functioning/Environment Prior Level of Function : Needs assist;History of Falls (last six months)       Physical Assist : Mobility (physical) Mobility (physical): Gait   Mobility Comments: used wheelchair to get to another chair and transferred with RW ADLs Comments: could manage her own bathing and dressing        OT Problem List: Decreased strength;Decreased activity tolerance;Impaired balance (sitting and/or standing);Decreased knowledge of use of DME or AE;Decreased knowledge of precautions;Cardiopulmonary status limiting activity;Pain      OT Treatment/Interventions: Self-care/ADL training;Therapeutic exercise;Neuromuscular education;Energy conservation;DME and/or AE instruction;Therapeutic activities;Patient/family education;Balance training    OT Goals(Current goals can be found in the care plan  section) Acute Rehab OT Goals Patient Stated Goal: SNF then home OT Goal Formulation: With patient/family Time For Goal Achievement: 10/31/21 Potential to Achieve Goals: Good  OT Frequency: Min 2X/week   Barriers to D/C:            Co-evaluation              AM-PAC OT "6 Clicks" Daily Activity     Outcome Measure Help from another person eating meals?: None Help from another person taking care of personal grooming?: A Little Help from another person toileting, which includes using toliet, bedpan, or urinal?: Total Help from another person bathing (including washing, rinsing, drying)?: Total Help from another person to put on and taking off regular upper body clothing?: A Little Help from another person to put on and taking off regular lower body clothing?: Total 6 Click Score: 13   End of Session Equipment Utilized During Treatment: Oxygen Nurse Communication: Mobility status  Activity Tolerance: Patient limited by pain Patient left: in bed;with call bell/phone within reach;with nursing/sitter in room;with family/visitor present  OT Visit Diagnosis: Other abnormalities of gait and mobility (R26.89);Muscle weakness (generalized) (M62.81);History of falling (Z91.81);Pain  Time: 5701-7793 OT Time Calculation (min): 28 min Charges:  OT General Charges $OT Visit: 1 Visit OT Evaluation $OT Eval Moderate Complexity: 1 Mod OT Treatments $Self Care/Home Management : 23-37 mins  Dareth Andrew C, OT/L  Acute Rehab [phone]  Lenice Llamas 10/17/2021, 2:22 PM

## 2021-10-17 NOTE — Progress Notes (Signed)
Orthopedic Tech Progress Note Patient Details:  Sierra Wright 06-09-1934 [id number]  Dropped off just one shoe waiting for another one to come to the office   Ortho Devices Type of Ortho Device: Postop shoe/boot Ortho Device/Splint Location: RLE Ortho Device/Splint Interventions: Ordered, Adjustment, Application, Removal   Post Interventions Patient Tolerated: Well Instructions Provided: Care of device  [name] 10/17/2021, 10:35 AM

## 2021-10-17 NOTE — Progress Notes (Signed)
Report received and care assumed from previous shift RN. VS obtained, shift assessments completed - see flowsheets. PRN pain medications given as needed - see MAR. Purewick in use. B/L LE dressings CDI. B/L heels elevated off bed. Repositioned patient. Patient currently resting in bed, bed in lowest position. Denies needs. Call bell within reach. Bedalarm in use at all times.

## 2021-10-17 NOTE — Plan of Care (Signed)

## 2021-10-17 NOTE — Progress Notes (Signed)
Inpatient Diabetes Program Recommendations  AACE/ADA: New Consensus Statement on Inpatient Glycemic Control   Target Ranges:  Prepandial:   less than 140 mg/dL      Peak postprandial:   less than 180 mg/dL (1-2 hours)      Critically ill patients:  140 - 180 mg/dL   Results for Sierra Wright, Sierra Wright (MRN [id number]) as of 10/17/2021 11:31  Ref. Range 10/16/2021 07:42 10/16/2021 11:34 10/16/2021 15:47 10/16/2021 21:33 10/17/2021 06:55 10/17/2021 10:07  Glucose-Capillary Latest Ref Range: 70 - 99 mg/dL 169 (H) 678 (H) 938 (H) 179 (H) 200 (H) 212 (H)   Review of Glycemic Control  Current orders for Inpatient glycemic control: Semglee 10 units daily, Novolog 0-15 units TID with meals, Novolog 0-5 units QHS  Inpatient Diabetes Program Recommendations:    Insulin: Please consider increasing Semglee to 13 units daily and ordering Novolog 3 units TID with meals for meal coverage if patient eats at least 50% of meals.  Thanks, Orlando Penner, RN, MSN, CDE Diabetes Coordinator Inpatient Diabetes Program [phone] (Team Pager from 8am to 5pm)

## 2021-10-17 NOTE — Progress Notes (Signed)
PROGRESS NOTE    OSIA AYE  [id number] DOB: 01-26-1934 DOA: 10/11/2021 PCP: [name], MD    Brief Narrative:  [name] is an 87/F with history of CAD/CABG, chronic diastolic CHF, type 2 diabetes mellitus, CVA, hypertension, hypothyroidism, osteoarthritis presented to [facility] ED on 11/9 from her PCPs office following a mechanical fall with history of pain to both ankles, reports that her legs gave out prior to her fall, denied any loss of consciousness. -In the emergency room vital signs were stable, labs noted creatinine of 2.2 pelvic x-rays were unremarkable, right ankle x-ray with bimalleolar acute displaced fracture, cortical irregularity medial talus -Left ankle x-ray with indeterminate nondisplaced distal fibular fracture with lateral ankle subcutaneous soft tissue edema.  EDP attempted manual reduction right ankle but was unsuccessful.  Orthopedics was consulted.,  Aspirin and Plavix were held she was transferred to [facility] for surgery early next week -11/11 noted to be in A. Fib/atrial flutter  Assessment & Plan:   Bilateral ankle fractures -Following mechanical fall, unfortunately sustained right ankle bimalleolar displaced fracture and nondisplaced distal fibular fracture on the left  -Orthopedics consulting, aspirin and Plavix were held, initial plan was for ORIF this week -Seen by Dr. Sharol Given today, felt risk of surgery was too high in the setting of peripheral vascular disease and severe osteoporosis, recommended nonsurgical management, nonweightbearing on both lower extremities for 3 weeks, may apply pressure through both lower extremities for transfers only, recommended follow-up in 2 weeks, continue splint for now -[facility] consult, will need short-term SNF -Started Eliquis for new onset A. fib  Atrial flutter/A. Fib -Detected on telemetry from 11/11 heart rate controlled, continue Lopressor -Heart rate controlled, remains in sinus rhythm now -chads2vasc  score >3, started on Eliquis this admission  Acute renal failure Creatinine on admission elevated 2.21, etiology likely secondary to prerenal from poor oral intake/dehydration Baseline creatinine 1.2.   --Cr 2.21>1.45> 1.1 -Improved, fluids discontinued  Hyperkalemia -Lokelma today, ACE inhibitor on hold, BMP in a.m.  CAD s/p CABG Chronic diastolic CHF --Holding aspirin/Plavix -Continue metoprolol -Lasix and benazepril on hold -Discontinue aspirin, Plavix while on Eliquis  Essential hypertension -Blood pressure slightly elevated, continue metoprolol, amlodipine, hydralazine and Imdur  -Blood pressure elevated, increased hydralazine and Imdur dose --discontinued IV fluids  Type 2 diabetes mellitus Hemoglobin A1c 6.5, well controlled.  Home regimen includes glimepiride 1 mg p.o. daily, Lantus 26 units subcutaneously daily. --Semglee 10 units [place] daily --moderate SSI for coverage --CBGs qAC/HS  Hypothyroidism --Levothyroxine 100 mcg p.o. daily  Hx CVA --Holding aspirin and Plavix as above.  Allergic to statin.  RLS: Continue Lyrica and Sinemet  Constipation -Increase laxatives   DVT prophylaxis: Eliquis 11/14 apixaban (ELIQUIS) tablet 5 mg   Code Status: Full Code Family Communication: No family at bedside, called and updated daughter  Disposition Plan: Will likely need short-term rehab.  Remains inpatient appropriate because: Severity of illness  Consultants:  Orthopedics, Dr. Erlinda Hong  Procedures:  None  Subjective: -Feels a little better overall, continues to have discomfort in both ankles especially on the right  Objective: Vitals:   10/16/21 1549 10/16/21 2132 10/17/21 0738 10/17/21 0923  BP: (!) 131/57 (!) 155/44 (!) 157/58   Pulse: 87 60 (!) 46 (!) 48  Resp: 16 16 16    Temp: 97.6 F (36.4 C) 98.9 F (37.2 C) 97.8 F (36.6 C)   TempSrc: Oral Oral Oral   SpO2: 90% (!) 89% 90%   Weight:      Height:  Intake/Output Summary (Last 24 hours) at  10/17/2021 1224 Last data filed at 10/17/2021 0900 Gross per 24 hour  Intake 500 ml  Output 750 ml  Net -250 ml   Filed Weights   10/11/21 1805  Weight: 95.3 kg    Examination:  Gen: Pleasant elderly female sitting up in bed, AAOx3, no distress HEENT: No JVD CVS: S1-S2, regular rate rhythm Lungs: Decreased breath sounds to bases Abdomen: Soft, nontender, bowel sounds present Extremities:  Bilateral lower extremities/ankles with splint in place, otherwise moves all extremities independently, neurovascular intact Skin: No rashes on exposed skin Psychiatry: Mood & affect appropriate.     Data Reviewed: I have personally reviewed following labs and imaging studies  CBC: Recent Labs  Lab 10/11/21 1815 10/12/21 0505 10/13/21 0324 10/14/21 0339 10/15/21 0140 10/17/21 0326  WBC 10.8* 13.5* 11.3* 12.0* 11.5* 10.1  NEUTROABS 8.3*  --   --   --   --   --   HGB 10.4* 13.7 12.0 11.8* 10.7* 11.1*  HCT 32.4* 42.3 35.6* 37.2 32.9* 34.9*  MCV 99.1 97.5 95.7 97.4 96.5 98.0  PLT 152 175 133* 160 158 [id number]   Basic Metabolic Panel: Recent Labs  Lab 10/12/21 0505 10/13/21 0324 10/14/21 0339 10/15/21 0140 10/17/21 0326  NA 140 133* 132* 132* 131*  K 4.9 4.4 4.3 4.4 5.3*  CL 109 106 103 104 102  CO2 25 20* 22 22 20*  GLUCOSE 135* 173* 161* 133* 183*  BUN 35* 23 19 25* 31*  CREATININE 1.45* 1.14* 1.13* 1.14* 1.27*  CALCIUM 8.7* 8.1* 8.3* 8.0* 8.1*  MG  --  1.6*  --   --   --    GFR: Estimated Creatinine Clearance: 37.7 mL/min (A) (by C-G formula based on SCr of 1.27 mg/dL (H)). Liver Function Tests: Recent Labs  Lab 10/11/21 1815  AST 14*  ALT 8  ALKPHOS 81  BILITOT 0.4  PROT 5.8*  ALBUMIN 3.4*   No results for input(s): LIPASE, AMYLASE in the last 168 hours. No results for input(s): AMMONIA in the last 168 hours. Coagulation Profile: No results for input(s): INR, PROTIME in the last 168 hours. Cardiac Enzymes: No results for input(s): CKTOTAL, CKMB, CKMBINDEX,  TROPONINI in the last 168 hours. BNP (last 3 results) No results for input(s): PROBNP in the last 8760 hours. HbA1C: No results for input(s): HGBA1C in the last 72 hours.  CBG: Recent Labs  Lab 10/16/21 1547 10/16/21 2133 10/17/21 0655 10/17/21 1007 10/17/21 1203  GLUCAP 192* 179* 200* 212* 237*   Lipid Profile: No results for input(s): CHOL, HDL, LDLCALC, TRIG, CHOLHDL, LDLDIRECT in the last 72 hours. Thyroid Function Tests: No results for input(s): TSH, T4TOTAL, FREET4, T3FREE, THYROIDAB in the last 72 hours. Anemia Panel: No results for input(s): VITAMINB12, FOLATE, FERRITIN, TIBC, IRON, RETICCTPCT in the last 72 hours.  Sepsis Labs: No results for input(s): PROCALCITON, LATICACIDVEN in the last 168 hours.  Recent Results (from the past 240 hour(s))  Resp Panel by RT-PCR (Flu A&B, Covid) Nasopharyngeal Swab     Status: None   Collection Time: 10/11/21 10:19 PM   Specimen: Nasopharyngeal Swab; Nasopharyngeal(NP) swabs in vial transport medium  Result Value Ref Range Status   SARS Coronavirus 2 by RT PCR NEGATIVE NEGATIVE Final    Comment: (NOTE) SARS-CoV-2 target nucleic acids are NOT DETECTED.  The SARS-CoV-2 RNA is generally detectable in upper respiratory specimens during the acute phase of infection. The lowest concentration of SARS-CoV-2 viral copies this assay can detect is  138 copies/mL. A negative result does not preclude SARS-Cov-2 infection and should not be used as the sole basis for treatment or other patient management decisions. A negative result may occur with  improper specimen collection/handling, submission of specimen other than nasopharyngeal swab, presence of viral mutation(s) within the areas targeted by this assay, and inadequate number of viral copies(<138 copies/mL). A negative result must be combined with clinical observations, patient history, and epidemiological information. The expected result is Negative.  Fact Sheet for Patients:   BloggerCourse.com  Fact Sheet for Healthcare Providers:  SeriousBroker.it  This test is no t yet approved or cleared by the [place] FDA and  has been authorized for detection and/or diagnosis of SARS-CoV-2 by FDA under an Emergency Use Authorization (EUA). This EUA will remain  in effect (meaning this test can be used) for the duration of the COVID-19 declaration under Section 564(b)(1) of the Act, 21 U.S.C.section 360bbb-3(b)(1), unless the authorization is terminated  or revoked sooner.       Influenza A by PCR NEGATIVE NEGATIVE Final   Influenza B by PCR NEGATIVE NEGATIVE Final    Comment: (NOTE) The Xpert Xpress SARS-CoV-2/FLU/RSV plus assay is intended as an aid in the diagnosis of influenza from Nasopharyngeal swab specimens and should not be used as a sole basis for treatment. Nasal washings and aspirates are unacceptable for Xpert Xpress SARS-CoV-2/FLU/RSV testing.  Fact Sheet for Patients: BloggerCourse.com  Fact Sheet for Healthcare Providers: SeriousBroker.it  This test is not yet approved or cleared by the [place] FDA and has been authorized for detection and/or diagnosis of SARS-CoV-2 by FDA under an Emergency Use Authorization (EUA). This EUA will remain in effect (meaning this test can be used) for the duration of the COVID-19 declaration under Section 564(b)(1) of the Act, 21 U.S.C. section 360bbb-3(b)(1), unless the authorization is terminated or revoked.  Performed at [facility], [address] [place], [place] [address]      Scheduled Meds:  amLODipine  5 mg Oral Daily   apixaban  5 mg Oral BID   carbidopa-levodopa  1.5 tablet Oral QHS   hydrALAZINE  50 mg Oral TID   insulin aspart  0-15 Units Subcutaneous TID WC   insulin aspart  0-5 Units Subcutaneous QHS   [START ON 10/18/2021] insulin glargine-yfgn  13 Units  Subcutaneous Daily   isosorbide mononitrate  60 mg Oral Daily   levothyroxine  100 mcg Oral QAC breakfast   metoprolol succinate  25 mg Oral Daily   nystatin   Topical TID   polyethylene glycol  17 g Oral BID   pregabalin  50 mg Oral BID   senna-docusate  2 tablet Oral BID   Continuous Infusions:   LOS: 5 days    Time spent:   Zannie Cove, MD [facility] Available via Epic secure chat 7am-7pm 10/17/2021, 12:24 PM

## 2021-10-18 DIAGNOSIS — N179 Acute kidney failure, unspecified: Secondary | ICD-10-CM

## 2021-10-18 DIAGNOSIS — S82899D Other fracture of unspecified lower leg, subsequent encounter for closed fracture with routine healing: Secondary | ICD-10-CM

## 2021-10-18 LAB — BASIC METABOLIC PANEL
Anion gap: 8 (ref 5–15)
BUN: 27 mg/dL — ABNORMAL HIGH (ref 8–23)
CO2: 20 mmol/L — ABNORMAL LOW (ref 22–32)
Calcium: 7.9 mg/dL — ABNORMAL LOW (ref 8.9–10.3)
Chloride: 100 mmol/L (ref 98–111)
Creatinine, Ser: 1.14 mg/dL — ABNORMAL HIGH (ref 0.44–1.00)
GFR, Estimated: 47 mL/min — ABNORMAL LOW (ref >60)
Glucose, Bld: 195 mg/dL — ABNORMAL HIGH (ref 70–99)
Potassium: 4.3 mmol/L (ref 3.5–5.1)
Sodium: 128 mmol/L — ABNORMAL LOW (ref 135–145)

## 2021-10-18 LAB — GLUCOSE, CAPILLARY
Glucose-Capillary: 177 mg/dL — ABNORMAL HIGH (ref 70–99)
Glucose-Capillary: 298 mg/dL — ABNORMAL HIGH (ref 70–99)
Glucose-Capillary: 198 mg/dL — ABNORMAL HIGH (ref 70–99)
Glucose-Capillary: 172 mg/dL — ABNORMAL HIGH (ref 70–99)

## 2021-10-18 MED ORDER — APIXABAN 5 MG PO TABS
5.0000 mg | ORAL_TABLET | Freq: Two times a day (BID) | ORAL | Status: AC
Start: 2021-10-18 — End: ?

## 2021-10-18 MED ORDER — OXYCODONE HCL 5 MG PO TABS: 5.0000 mg | ORAL_TABLET | Freq: Four times a day (QID) | ORAL | 0 refills | Status: DC | PRN

## 2021-10-18 MED ORDER — LANTUS SOLOSTAR 100 UNIT/ML ~~LOC~~ SOPN: 20.0000 [IU] | PEN_INJECTOR | Freq: Every day | SUBCUTANEOUS | Status: DC

## 2021-10-18 MED ORDER — SENNOSIDES-DOCUSATE SODIUM 8.6-50 MG PO TABS
1.0000 | ORAL_TABLET | Freq: Two times a day (BID) | ORAL | Status: DC
Start: 2021-10-18 — End: 2022-02-08

## 2021-10-18 MED ORDER — FUROSEMIDE 40 MG PO TABS: 40.0000 mg | ORAL_TABLET | Freq: Every day | ORAL | Status: DC

## 2021-10-18 MED ORDER — FLEET ENEMA 7-19 GM/118ML RE ENEM
1.0000 | ENEMA | Freq: Once | RECTAL | Status: AC
  Administered 2021-10-18: 1 via RECTAL
  Filled 2021-10-18: qty 1

## 2021-10-18 MED ORDER — METHOCARBAMOL 500 MG PO TABS
500.0000 mg | ORAL_TABLET | Freq: Four times a day (QID) | ORAL | Status: DC | PRN
Start: 2021-10-18 — End: 2021-10-24

## 2021-10-18 MED ORDER — POLYETHYLENE GLYCOL 3350 17 G PO PACK: 17.0000 g | PACK | Freq: Every day | ORAL | 0 refills | Status: DC

## 2021-10-18 MED ORDER — BENAZEPRIL HCL 10 MG PO TABS: 10.0000 mg | ORAL_TABLET | Freq: Every day | ORAL | Status: DC

## 2021-10-18 NOTE — Discharge Summary (Signed)
Physician Discharge Summary  Sierra Wright [id number] DOB: 1934/08/30 DOA: 10/11/2021  PCP: [name], MD  Admit date: 10/11/2021 Discharge date: 10/18/2021  Time spent: 35 minutes  Recommendations for Outpatient Follow-up:  Follow-up with orthopedics Dr. Sharol Given in 2 weeks PCP in 1 week, newly started on Eliquis for A. Fib, titrate diuretics based on volume status Follow-up with cardiology in 1 month SNF for short-term rehab   Discharge Diagnoses:  Principal Problem:   Ankle fracture Bilateral ankle fractures Osteoporosis Fall Paroxysmal atrial fibrillation Type 2 diabetes mellitus Chronic diastolic CHF CAD/CABG History of CVA   Essential hypertension   RLS (restless legs syndrome)   AKI (acute kidney injury) ([place])   Anemia   PVD (peripheral vascular disease) ([place])   Discharge Condition: Stable  Diet recommendation: Diabetic, low-sodium, heart healthy  Filed Weights   10/11/21 1805  Weight: 95.3 kg    History of present illness:  Sierra Wright is an 87/F with history of CAD/CABG, chronic diastolic CHF, type 2 diabetes mellitus, CVA, hypertension, hypothyroidism, osteoarthritis presented to [facility], ED on 11/9 from her PCPs office following a mechanical fall with history of pain to both ankles, reports that her legs gave out prior to her fall, denied any loss of consciousness. -In the emergency room vital signs were stable, labs noted creatinine of 2.2 pelvic x-rays were unremarkable, right ankle x-ray with bimalleolar acute displaced fracture, cortical irregularity medial talus -Left ankle x-ray with indeterminate nondisplaced distal fibular fracture with lateral ankle subcutaneous soft tissue edema.  EDP attempted manual reduction right ankle but was unsuccessful.  Orthopedics was consulted.,  Aspirin and Plavix were held she was transferred to [facility] Course:   Bilateral ankle fractures -Following mechanical fall, unfortunately sustained right  ankle bimalleolar displaced fracture and nondisplaced distal fibular fracture on the left  -Orthopedics consulting, aspirin and Plavix were held, initial plan was for ORIF this week -Seen by Dr. Sharol Given yesterday, felt risk of surgery was too high in the setting of peripheral vascular disease and severe osteoporosis, recommended nonsurgical management, nonweightbearing on both lower extremities for 3 weeks, may apply pressure through both lower extremities for transfers only, recommended follow-up in 2 weeks, continue splint for now -[facility] consult, will need short-term SNF -Started Eliquis for new onset A. fib   Atrial flutter/A. Fib -Detected on telemetry from 11/11 through 11/13, heart rate controlled, continued on Lopressor -Heart rate controlled, remains in sinus rhythm now -chads2vasc score >3, started on Eliquis this admission   Acute renal failure Creatinine on admission elevated 2.21, etiology likely secondary to prerenal from poor oral intake/dehydration Baseline creatinine 1.2.   --Cr 2.21>1.45> 1.1 -Improved, fluids discontinued   Hyperkalemia -Lokelma today, ACE inhibitor on hold, BMP in a.m.   CAD s/p CABG Chronic diastolic CHF -held aspirin and Plavix on admission -Continue metoprolol -Lasix and benazepril resumed at lower dose -Discontinued aspirin, Plavix while on Eliquis   Essential hypertension -Blood pressure slightly elevated, continue metoprolol, amlodipine, hydralazine and Imdur  -Resume benazepril and Lasix at discharge   Type 2 diabetes mellitus Hemoglobin A1c 6.5, well controlled.  Home regimen includes glimepiride 1 mg p.o. daily, Lantus -Resume insulin at discharge, dose decreased  Hypothyroidism --Levothyroxine 100 mcg p.o. daily   Hx CVA -- Discontinued aspirin and Plavix as above, on Eliquis now.  Allergic to statin.   RLS: Continue Lyrica and Sinemet    Discharge Exam: Vitals:   10/17/21 2014 10/18/21 0700  BP: (!) 151/48 (!) 159/61  Pulse: 74  69  Resp: 16 18  Temp: 98.3 F (36.8 C) 98.6 F (37 C)  SpO2: 99% 100%   Gen: Pleasant elderly female sitting up in bed, AAOx3, no distress HEENT: No JVD CVS: S1-S2, regular rate rhythm Lungs: Decreased breath sounds at bases Abdomen: Soft, nontender, bowel sounds present Extremities:  Bilateral lower extremities/ankles with splint in place, Skin: No rashes on exposed skin Psychiatry: Mood & affect appropriate.       Discharge Instructions    Allergies as of 10/18/2021       Reactions   Duloxetine Hcl Nausea And Vomiting, Other (See Comments)   Statins Other (See Comments)   Causes myalgias Other reaction(s): Unknown   Amitriptyline Other (See Comments)   Pt. States that it caused it to be suicidal.    Benadryl [diphenhydramine Hcl (sleep)] Other (See Comments)   Makes the patient very "hyper"   Ezetimibe    Other reaction(s): Unknown Other reaction(s): Unknown   Metformin Hcl    Other reaction(s): Unknown Other reaction(s): Unknown   Other    Other reaction(s): Unknown   Sitagliptin    Other reaction(s): Unknown Other reaction(s): Unknown   Tylenol [acetaminophen] Nausea Only   Colesevelam Hcl Other (See Comments)   Causes dizziness Other reaction(s): Unknown        Medication List     STOP taking these medications    aspirin 81 MG EC tablet   clopidogrel 75 MG tablet Commonly known as: PLAVIX   diazepam 10 MG tablet Commonly known as: VALIUM   gabapentin 100 MG capsule Commonly known as: NEURONTIN   HYDROcodone-acetaminophen 5-325 MG tablet Commonly known as: Norco   potassium chloride 10 MEQ tablet Commonly known as: KLOR-CON       TAKE these medications    amLODipine 5 MG tablet Commonly known as: NORVASC Take 1 tablet by mouth once daily   apixaban 5 MG Tabs tablet Commonly known as: ELIQUIS Take 1 tablet (5 mg total) by mouth 2 (two) times daily.   BD Pen Needle Nano 2nd Gen 32G X 4 MM Misc Generic drug: Insulin Pen  Needle See admin instructions.   BD Pen Needle Nano 2nd Gen 32G X 4 MM Misc Generic drug: Insulin Pen Needle daily. use as directed   benazepril 10 MG tablet Commonly known as: LOTENSIN Take 1 tablet (10 mg total) by mouth daily. What changed:  medication strength how much to take   calcium carbonate 1250 (500 Ca) MG tablet Commonly known as: OS-CAL - dosed in mg of elemental calcium Take 1 tablet by mouth daily.   carbidopa-levodopa 25-100 MG tablet Commonly known as: SINEMET IR Take 1.5 tablets by mouth at bedtime.   cholecalciferol 25 MCG (1000 UNIT) tablet Commonly known as: VITAMIN D3 Take 1,000 Units by mouth daily.   furosemide 40 MG tablet Commonly known as: LASIX Take 1 tablet (40 mg total) by mouth daily. What changed: when to take this   Garlic 123XX123 MG Caps Take 1,000 mg by mouth daily.   glimepiride 1 MG tablet Commonly known as: AMARYL Take 1 mg by mouth daily with breakfast.   hydrALAZINE 25 MG tablet Commonly known as: APRESOLINE TAKE 1 TABLET BY MOUTH THREE TIMES DAILY What changed: when to take this   isosorbide mononitrate 30 MG 24 hr tablet Commonly known as: IMDUR Take 1 tablet by mouth once daily   Lantus SoloStar 100 UNIT/ML Solostar Pen Generic drug: insulin glargine Inject 20 Units into the skin daily. What changed: how much  to take   levothyroxine 100 MCG tablet Commonly known as: SYNTHROID Take 100 mcg by mouth daily before breakfast.   methocarbamol 500 MG tablet Commonly known as: ROBAXIN Take 1 tablet (500 mg total) by mouth every 6 (six) hours as needed for muscle spasms.   metoprolol succinate 25 MG 24 hr tablet Commonly known as: TOPROL-XL Take 1 tablet by mouth once daily   nitroGLYCERIN 0.4 MG SL tablet Commonly known as: NITROSTAT DISSOLVE ONE TABLET UNDER THE TONGUE EVERY 5 MINUTES AS NEEDED FOR CHEST PAIN. What changed: See the new instructions.   ondansetron 4 MG tablet Commonly known as: ZOFRAN Take 4 mg by  mouth every 6 (six) hours as needed for nausea.   OneTouch Delica Plus Lancet33G Misc Apply topically 2 (two) times daily.   OneTouch Verio test strip Generic drug: glucose blood SMARTSIG:Strip(s) Via Meter Twice Daily   oxyCODONE 5 MG immediate release tablet Commonly known as: Roxicodone Take 1-2 tablets (5-10 mg total) by mouth every 6 (six) hours as needed for severe pain. What changed: how much to take   polyethylene glycol 17 g packet Commonly known as: MIRALAX / GLYCOLAX Take 17 g by mouth daily.   pregabalin 50 MG capsule Commonly known as: LYRICA Take 50 mg by mouth 2 (two) times daily.   senna-docusate 8.6-50 MG tablet Commonly known as: Senokot-S Take 1 tablet by mouth 2 (two) times daily.   vitamin B-12 1000 MCG tablet Commonly known as: CYANOCOBALAMIN Take 1,000 mcg by mouth daily.   vitamin C 1000 MG tablet Take 1,000 mg by mouth daily.       Allergies  Allergen Reactions   Duloxetine Hcl Nausea And Vomiting and Other (See Comments)   Statins Other (See Comments)    Causes myalgias Other reaction(s): Unknown   Amitriptyline Other (See Comments)    Pt. States that it caused it to be suicidal.    Benadryl [Diphenhydramine Hcl (Sleep)] Other (See Comments)    Makes the patient very "hyper"   Ezetimibe     Other reaction(s): Unknown Other reaction(s): Unknown   Metformin Hcl     Other reaction(s): Unknown Other reaction(s): Unknown   Other     Other reaction(s): Unknown   Sitagliptin     Other reaction(s): Unknown Other reaction(s): Unknown   Tylenol [Acetaminophen] Nausea Only   Colesevelam Hcl Other (See Comments)    Causes dizziness Other reaction(s): Unknown    Follow-up Information     Nadara Mustard, MD Follow up in 2 week(s).   Specialty: Orthopedic Surgery Contact information: [address] [place] [address] [phone]                  The results of significant diagnostics from this hospitalization (including  imaging, microbiology, ancillary and laboratory) are listed below for reference.    Significant Diagnostic Studies: DG Pelvis 1-2 Views  Result Date: 10/11/2021 CLINICAL DATA:  Status post fall.  Pain EXAM: PELVIS - 1-2 VIEW COMPARISON:  None. FINDINGS: Markedly limited evaluation due to overlapping osseous structures and overlying soft tissues. There is no evidence of pelvic fracture or diastasis. No pelvic bone lesions are seen. Severe degenerative changes left hip. Degenerative changes of the sacroiliac joints. No hip dislocation. Vascular calcification. IMPRESSION: 1. Markedly limited evaluation due to overlapping osseous structures and overlying soft tissues. 2. No definite acute displaced fracture, hip dislocation, or pelvic diastasis. Electronically Signed   By: Tish Frederickson M.D.   On: 10/11/2021 21:00   DG Ankle  Complete Left  Addendum Date: 10/11/2021   ADDENDUM REPORT: 10/11/2021 20:56 ADDENDUM: Findings should state no evidence of "severe" arthropathy or other focal bone abnormality. Findings should also mention diffuse decreased bone density. Electronically Signed   By: Iven Finn M.D.   On: 10/11/2021 20:56   Result Date: 10/11/2021 CLINICAL DATA:  fall pain EXAM: LEFT ANKLE COMPLETE - 3+ VIEW COMPARISON:  X-ray left tibia fibula 03/02/2005 FINDINGS: Age-indeterminate nondisplaced distal fibular fracture. There is no evidence of definite acute displaced fracture, dislocation, or joint effusion. Degenerative changes of the navicular noted. Plantar calcaneal spur. There is no evidence of arthropathy or other focal bone abnormality. Mild lateral ankle subcutaneus soft tissue edema. Vascular calcifications. IMPRESSION: indeterminate nondisplaced distal fibular fracture. Associated lateral ankle subcutaneus soft tissue edema. Correlate with point tenderness to evaluate for acuity. Electronically Signed: By: Iven Finn M.D. On: 10/11/2021 20:53   DG Ankle Complete Right  Result  Date: 10/11/2021 CLINICAL DATA:  Status fall EXAM: RIGHT ANKLE - COMPLETE 3+ VIEW COMPARISON:  None. FINDINGS: Diffusely decreased bone density. Acute inferiorly displaced medial malleolar fracture. Acute displaced distal fibular fracture. No definite posterior malleolar fracture. Vague cortical irregularity the medial talus with no definite acute displaced talar fracture. Degenerative changes of the midfoot. Plantar calcaneal spur. There is no evidence of severe arthropathy or other focal bone abnormality. Subcutaneus soft tissue edema of the ankle. Vascular clips overlie the soft tissues of the right distal leg. Vascular calcifications. IMPRESSION: 1. Bimalleolar acute displaced fracture. 2. Vague cortical irregularity the medial talus with no definite acute displaced talar fracture. 3. Diffusely decreased bone density. Electronically Signed   By: Iven Finn M.D.   On: 10/11/2021 20:57   CT ANKLE RIGHT WO CONTRAST  Result Date: 10/12/2021 CLINICAL DATA:  Fracture, ankle EXAM: CT OF THE RIGHT ANKLE WITHOUT CONTRAST TECHNIQUE: Multidetector CT imaging of the right ankle was performed according to the standard protocol. Multiplanar CT image reconstructions were also generated. COMPARISON:  Ankle radiograph 10/11/2021 FINDINGS: Bones/Joint/Cartilage There is a trimalleolar ankle fracture. Oblique distal fibular fracture has minimal 2 mm posterolateral displacement. Medial malleolar fracture has minimal 1-2 mm placement. Posterior malleolar fracture has proximally 1 mm posterior displacement and involves approximately 15% of the articular surface. Tiny fracture of the anterolateral distal tibia consistent with anterior inferior tibiofibular ligament avulsion (series 6, image 42). There is a splint in place. The ankle mortise appears well aligned. Osteopenia. There is mild posterior subtalar, talonavicular, calcaneocuboid, navicular-cuneiform and diffuse tarsometatarsal joint osteoarthritis. Ligaments  Suboptimally assessed by CT. Muscles and Tendons Generalized muscle atrophy.  No evidence of tendon entrapment. Soft tissues Extensive soft tissue swelling. IMPRESSION: Trimalleolar ankle fracture with minimal 1-2 mm displacement. The posterior malleolar fracture involves approximately 15% of the articular surface. Tiny additional fracture of the anterolateral distal tibia consistent with an anterior inferior tibiofibular ligament avulsion. Diffuse osteopenia. Generalized muscle atrophy. Mild hindfoot and midfoot osteoarthritis. Extensive soft tissue swelling. Electronically Signed   By: Maurine Simmering M.D.   On: 10/12/2021 08:59   CT ANKLE LEFT WO CONTRAST  Result Date: 10/15/2021 CLINICAL DATA:  Fracture, ankle EXAM: CT OF THE LEFT ANKLE WITHOUT CONTRAST TECHNIQUE: Multidetector CT imaging of the left ankle was performed according to the standard protocol. Multiplanar CT image reconstructions were also generated. COMPARISON:  Radiograph 10/11/2021 FINDINGS: Bones/Joint/Cartilage There is a minimally displaced distal fibular fracture, with 2 mm posterior displacement. There is an anterolateral distal tibia fracture consistent with an anterior inferior tibiofibular ligament avulsion. Diffuse osteopenia. No other  additional fracture. There is no significant tibiotalar osteoarthritis. There is mild talonavicular and calcaneocuboid osteoarthritis. Ligaments Suboptimally assessed by CT. Muscles and Tendons And no evidence of tendon entrapment.  Generalized muscle atrophy. Soft tissues Lateral ankle soft tissue swelling. IMPRESSION: Minimally displaced distal fibula fracture. Anterior inferior tibiofibular ligament avulsion fracture of the distal tibia. Electronically Signed   By: Maurine Simmering M.D.   On: 10/15/2021 17:03   MR Hip Left w/o contrast  Result Date: 10/05/2021 CLINICAL DATA:  Left hip pain EXAM: MR OF THE LEFT HIP WITHOUT CONTRAST TECHNIQUE: Multiplanar, multisequence MR imaging was performed. No  intravenous contrast was administered. COMPARISON:  Hip radiograph 09/20/2021 FINDINGS: Bones: There is severe left hip arthritis with subchondral fracture of the femoral head, articular surface flattening, and periarticular bony edema and subchondral cystic change. No focal bone lesion. The visualized sacroiliac joints and symphysis pubis appear normal. Lower lumbar spine degenerative disc disease, partially visualized. Articular cartilage and labrum Articular cartilage: Severe chondrosis with essentially full-thickness cartilage loss. Labrum: Diffuse labral degeneration and tearing. Joint or bursal effusion Joint effusion: Trace joint effusion. Bursae: No evidence of trochanteric bursitis. Muscles and tendons Muscles and tendons: The gluteal tendons are intact. The proximal hamstrings are intact.Reactive edema within the left hip adductors. There is left greater than right gluteal muscle atrophy. Other findings Miscellaneous: Prior hysterectomy. IMPRESSION: Severe left hip osteoarthritis with subchondral fracture of the femoral head and articular surface flattening/partial collapse. Diffuse labral degeneration and tearing. Electronically Signed   By: Maurine Simmering M.D.   On: 10/05/2021 14:46   DG Ankle Right Port  Result Date: 10/11/2021 CLINICAL DATA:  Post reduction. EXAM: PORTABLE RIGHT ANKLE - 2 VIEW COMPARISON:  10/11/2021. FINDINGS: Examination is limited due to overlying casting material. There is diffusely decreased mineralization of the bones. There is redemonstration of an oblique fracture of the distal fibular shaft and medial malleolus and possible fracture of the posterior tibia. Alignment is unchanged and there is no dislocation. A moderate calcaneal spurs present. Soft tissue swelling is noted about the ankle. IMPRESSION: Fractures of the distal fibula and medial malleolus, and possible fracture of the posterior tibia. Alignment is unchanged. Electronically Signed   By: Brett Fairy M.D.   On:  10/11/2021 23:47   XR C-ARM NO REPORT  Result Date: 09/27/2021 Please see Notes tab for imaging impression.  DG Hip Unilat W or Wo Pelvis 2-3 Views Left  Result Date: 09/20/2021 CLINICAL DATA:  Hip pain EXAM: DG HIP (WITH OR WITHOUT PELVIS) 2-3V LEFT COMPARISON:  None. FINDINGS: No dislocation. No definite acute fracture. Advanced osteoarthritis at the left hip with loss of joint space superiorly. Vascular calcifications are noted. Osseous mineralization is decreased. IMPRESSION: Advanced left hip osteoarthritis. No definite acute fracture. If there is persistent concern for fracture, CT could be obtained. Electronically Signed   By: Macy Mis M.D.   On: 09/20/2021 13:16    Microbiology: Recent Results (from the past 240 hour(s))  Resp Panel by RT-PCR (Flu A&B, Covid) Nasopharyngeal Swab     Status: None   Collection Time: 10/11/21 10:19 PM   Specimen: Nasopharyngeal Swab; Nasopharyngeal(NP) swabs in vial transport medium  Result Value Ref Range Status   SARS Coronavirus 2 by RT PCR NEGATIVE NEGATIVE Final    Comment: (NOTE) SARS-CoV-2 target nucleic acids are NOT DETECTED.  The SARS-CoV-2 RNA is generally detectable in upper respiratory specimens during the acute phase of infection. The lowest concentration of SARS-CoV-2 viral copies this assay can detect is 138 copies/mL.  A negative result does not preclude SARS-Cov-2 infection and should not be used as the sole basis for treatment or other patient management decisions. A negative result may occur with  improper specimen collection/handling, submission of specimen other than nasopharyngeal swab, presence of viral mutation(s) within the areas targeted by this assay, and inadequate number of viral copies(<138 copies/mL). A negative result must be combined with clinical observations, patient history, and epidemiological information. The expected result is Negative.  Fact Sheet for Patients:   EntrepreneurPulse.com.au  Fact Sheet for Healthcare Providers:  IncredibleEmployment.be  This test is no t yet approved or cleared by the [place] FDA and  has been authorized for detection and/or diagnosis of SARS-CoV-2 by FDA under an Emergency Use Authorization (EUA). This EUA will remain  in effect (meaning this test can be used) for the duration of the COVID-19 declaration under Section 564(b)(1) of the Act, 21 U.S.C.section 360bbb-3(b)(1), unless the authorization is terminated  or revoked sooner.       Influenza A by PCR NEGATIVE NEGATIVE Final   Influenza B by PCR NEGATIVE NEGATIVE Final    Comment: (NOTE) The Xpert Xpress SARS-CoV-2/FLU/RSV plus assay is intended as an aid in the diagnosis of influenza from Nasopharyngeal swab specimens and should not be used as a sole basis for treatment. Nasal washings and aspirates are unacceptable for Xpert Xpress SARS-CoV-2/FLU/RSV testing.  Fact Sheet for Patients: EntrepreneurPulse.com.au  Fact Sheet for Healthcare Providers: IncredibleEmployment.be  This test is not yet approved or cleared by the [place] FDA and has been authorized for detection and/or diagnosis of SARS-CoV-2 by FDA under an Emergency Use Authorization (EUA). This EUA will remain in effect (meaning this test can be used) for the duration of the COVID-19 declaration under Section 564(b)(1) of the Act, 21 U.S.C. section 360bbb-3(b)(1), unless the authorization is terminated or revoked.  Performed at [facility], [place] [address] [place], [place] [address]      Labs: Basic Metabolic Panel: Recent Labs  Lab 10/13/21 0324 10/14/21 0339 10/15/21 0140 10/17/21 0326 10/17/21 1552 10/18/21 0200  NA 133* 132* 132* 131* 131* 128*  K 4.4 4.3 4.4 5.3* 4.7 4.3  CL 106 103 104 102 101 100  CO2 20* 22 22 20* 24 20*  GLUCOSE 173* 161* 133* 183* 193* 195*  BUN  23 19 25* 31* 28* 27*  CREATININE 1.14* 1.13* 1.14* 1.27* 1.28* 1.14*  CALCIUM 8.1* 8.3* 8.0* 8.1* 8.1* 7.9*  MG 1.6*  --   --   --   --   --    Liver Function Tests: Recent Labs  Lab 10/11/21 1815  AST 14*  ALT 8  ALKPHOS 81  BILITOT 0.4  PROT 5.8*  ALBUMIN 3.4*   No results for input(s): LIPASE, AMYLASE in the last 168 hours. No results for input(s): AMMONIA in the last 168 hours. CBC: Recent Labs  Lab 10/11/21 1815 10/12/21 0505 10/13/21 0324 10/14/21 0339 10/15/21 0140 10/17/21 0326  WBC 10.8* 13.5* 11.3* 12.0* 11.5* 10.1  NEUTROABS 8.3*  --   --   --   --   --   HGB 10.4* 13.7 12.0 11.8* 10.7* 11.1*  HCT 32.4* 42.3 35.6* 37.2 32.9* 34.9*  MCV 99.1 97.5 95.7 97.4 96.5 98.0  PLT 152 175 133* 160 158 207   Cardiac Enzymes: No results for input(s): CKTOTAL, CKMB, CKMBINDEX, TROPONINI in the last 168 hours. BNP: BNP (last 3 results) No results for input(s): BNP in the last 8760 hours.  ProBNP (last  3 results) No results for input(s): PROBNP in the last 8760 hours.  CBG: Recent Labs  Lab 10/17/21 1007 10/17/21 1203 10/17/21 1653 10/17/21 2014 10/18/21 0703  GLUCAP 212* 237* 206* 150* 177*       Signed:  Domenic Polite MD.  [facility] 10/18/2021, 10:32 AM

## 2021-10-18 NOTE — TOC Progression Note (Addendum)
Transition of Care [facility]) - Progression Note    Patient Details  Name: Sierra Wright MRN: [id number] Date of Birth: [birth year]  [facility] CM/SW Contact  Lorri Frederick, LCSW Phone Number: 10/18/2021, 11:49 AM  Clinical Narrative:   CSW brought medicare.gov choice document to pt room and provided bed offers to pt and daughter Sierra Wright.  Daughter reviewing, asked for response from [place].   1300: CSW received response from [place], who can offer bed. Auth initiated in [place].           Expected Discharge Plan and Services                                                 Social Determinants of Health (SDOH) Interventions    Readmission Risk Interventions No flowsheet data found.

## 2021-10-18 NOTE — Plan of Care (Signed)
Pt declined declined miralax these am.  Advised to take because she has not had a BM in the last 72 yrs.  Pt declined and stated that miralax made her nauseous yesterday.

## 2021-10-18 NOTE — Progress Notes (Signed)
Physical Therapy Treatment Patient Details Name: Sierra Wright MRN: [id number] DOB: 1934-04-29 Today's Date: 10/18/2021   History of Present Illness 87 yo female with onset of fall in MD office was brought to hosp on 11/9, now determined to be inappropriate for surgery to do surgery for ankle fractures.  Has trimalleolar fracture on R ankle, L has minimally displaced distal fib fracture, anterior inferior tibiobifular ligament avulsion fracture distal tibia.  NWB orders for now B feet, expected to go to SNF.  PMHx:  a-flutter, a-fib, CAD, CABG, atherosclerosis, DM, HTN, PVD, depression, PN, HOH, SOB, hypothyroidism,    PT Comments    Pt demos improved activity tolerance and is able to maintain sitting at EOB with LE's dangling for extended period of time.  Pt tolerates limited therex while sitting EOB, c/o inc pain with positioning.  Unable to progress to slide board transfer at this time due to limited sitting tolerance.  PT to continue to work towards progression of slide board transfer as appropriate.   Recommendations for follow up therapy are one component of a multi-disciplinary discharge planning process, led by the attending physician.  Recommendations may be updated based on patient status, additional functional criteria and insurance authorization.  Follow Up Recommendations  Skilled nursing-short term rehab (<3 hours/day)     Assistance Recommended at Discharge Frequent or constant Supervision/Assistance  Equipment Recommendations  Wheelchair (measurements PT);Other (comment) (slide board)    Recommendations for Other Services       Precautions / Restrictions Precautions Precautions: Fall Precaution Comments: NWB for now on B feet Required Braces or Orthoses: Splint/Cast Splint/Cast: B ankles Restrictions Weight Bearing Restrictions: Yes RLE Weight Bearing: Non weight bearing LLE Weight Bearing: Non weight bearing     Mobility  Bed Mobility Overal bed mobility:  Needs Assistance Bed Mobility: Supine to Sit     Supine to sit: Mod assist Sit to supine: Max assist   General bed mobility comments: Pt. requires mod A for B LE negotiation to EOB and use of chuck pad to scoot pelvis towards EOB.  Trunk and HH support required to sit up.  Once in sitting, able to use UEs to scoot further towards EOB and straighten out pelvis.  Able to tolerate LE dangling today but c/o nausea with sitting. Pt. requires max A to transfer BTB with B LE supported and trunk support.  Max A to scoot up in bed with use of chuck pad.  Pt. encouraged to assist with bedrails but states "she can't"    Transfers                        Ambulation/Gait                   Stairs             Wheelchair Mobility    Modified Rankin (Stroke Patients Only)       Balance Overall balance assessment: Needs assistance Sitting-balance support: Bilateral upper extremity supported;Feet unsupported Sitting balance-Leahy Scale: Poor                                      Cognition Arousal/Alertness: Awake/alert Behavior During Therapy: WFL for tasks assessed/performed Overall Cognitive Status: Within Functional Limits for tasks assessed  General Comments: Pt. is supine in bed when PT arrives.  Daughter in room.  Agreeable to work with PT despite pain.        Exercises General Exercises - Lower Extremity Long Arc Quad: AROM;Both;20 reps Hip Flexion/Marching: AROM;Both;10 reps;Seated (After 1st set of hip flexion, pt states she can't do anymore and requests to return BTB.  Limited by pain.)    General Comments        Pertinent Vitals/Pain Pain Assessment: 0-10 Pain Score: 10-Worst pain ever (Pt states 12-15/10 pain) Pain Location: B LEs Pain Descriptors / Indicators: Aching;Constant;Discomfort Pain Intervention(s): Limited activity within patient's tolerance;Monitored during session (Attempted  to contact NSG for pain meds, however, NSG is in a code with another pt)    Home Living                          Prior Function            PT Goals (current goals can now be found in the care plan section) Progress towards PT goals: Progressing toward goals    Frequency           PT Plan Current plan remains appropriate    Co-evaluation              AM-PAC PT "6 Clicks" Mobility   Outcome Measure  Help needed turning from your back to your side while in a flat bed without using bedrails?: A Lot Help needed moving from lying on your back to sitting on the side of a flat bed without using bedrails?: A Lot Help needed moving to and from a bed to a chair (including a wheelchair)?: A Lot Help needed standing up from a chair using your arms (e.g., wheelchair or bedside chair)?: Total Help needed to walk in hospital room?: Total Help needed climbing 3-5 steps with a railing? : Total 6 Click Score: 9    End of Session   Activity Tolerance: Patient limited by pain Patient left: in bed;with family/visitor present;with call bell/phone within reach;with bed alarm set         Time: [id number] PT Time Calculation (min) (ACUTE ONLY): 15 min  Charges:  $Therapeutic Activity: 8-22 mins                     Kristalynn Coddington A. Clark Clowdus, PT, DPT Acute Rehabilitation Services Office: [place] 10/18/2021, 10:22 AM

## 2021-10-19 DIAGNOSIS — N179 Acute kidney failure, unspecified: Secondary | ICD-10-CM | POA: Diagnosis not present

## 2021-10-19 DIAGNOSIS — G2581 Restless legs syndrome: Secondary | ICD-10-CM

## 2021-10-19 DIAGNOSIS — S82899D Other fracture of unspecified lower leg, subsequent encounter for closed fracture with routine healing: Secondary | ICD-10-CM | POA: Diagnosis not present

## 2021-10-19 DIAGNOSIS — S82891A Other fracture of right lower leg, initial encounter for closed fracture: Secondary | ICD-10-CM | POA: Diagnosis not present

## 2021-10-19 DIAGNOSIS — S82892A Other fracture of left lower leg, initial encounter for closed fracture: Secondary | ICD-10-CM | POA: Diagnosis not present

## 2021-10-19 DIAGNOSIS — R110 Nausea: Secondary | ICD-10-CM

## 2021-10-19 LAB — GLUCOSE, CAPILLARY
Glucose-Capillary: 235 mg/dL — ABNORMAL HIGH (ref 70–99)
Glucose-Capillary: 242 mg/dL — ABNORMAL HIGH (ref 70–99)
Glucose-Capillary: 109 mg/dL — ABNORMAL HIGH (ref 70–99)
Glucose-Capillary: 76 mg/dL (ref 70–99)

## 2021-10-19 LAB — SARS CORONAVIRUS 2 (TAT 6-24 HRS): SARS Coronavirus 2: NEGATIVE

## 2021-10-19 MED ORDER — PROCHLORPERAZINE EDISYLATE 10 MG/2ML IJ SOLN
5.0000 mg | Freq: Four times a day (QID) | INTRAMUSCULAR | Status: DC | PRN
  Administered 2021-10-20: 5 mg via INTRAVENOUS
  Filled 2021-10-19: qty 2

## 2021-10-19 MED ORDER — PROCHLORPERAZINE EDISYLATE 10 MG/2ML IJ SOLN
5.0000 mg | Freq: Once | INTRAMUSCULAR | Status: AC
  Administered 2021-10-19: 14:00:00 5 mg via INTRAVENOUS
  Filled 2021-10-19: qty 2

## 2021-10-19 MED ORDER — BISACODYL 10 MG RE SUPP
10.0000 mg | Freq: Once | RECTAL | Status: DC
  Filled 2021-10-19: qty 1

## 2021-10-19 NOTE — Progress Notes (Signed)
PROGRESS NOTE    Sierra Wright  [id number] DOB: 09-17-1934 DOA: 10/11/2021 PCP: [name], MD    Chief Complaint  Patient presents with   Ankle Pain    Mechanical fall witnessed, right ankle deformity.   Diarrhea    X several days   Hip Pain    Left hip pain hurting even prior to fall, pt has had this worked up in the past.    Brief Narrative:  Sierra Wright is an 87/F with history of CAD/CABG, chronic diastolic CHF, type 2 diabetes mellitus, CVA, hypertension, hypothyroidism, osteoarthritis presented to [facility] ED on 11/9 from her PCPs office following a mechanical fall with history of pain to both ankles, reports that her legs gave out prior to her fall, denied any loss of consciousness. -In the emergency room vital signs were stable, labs noted creatinine of 2.2 pelvic x-rays were unremarkable, right ankle x-ray with bimalleolar acute displaced fracture, cortical irregularity medial talus -Left ankle x-ray with indeterminate nondisplaced distal fibular fracture with lateral ankle subcutaneous soft tissue edema.  EDP attempted manual reduction right ankle but was unsuccessful.  Orthopedics was consulted.,  Aspirin and Plavix were held she was transferred to [facility] for surgery early next week -11/11 noted to be in A. Fib/atrial flutter   Assessment & Plan:   Principal Problem:   Ankle fracture Active Problems:   Essential hypertension   RLS (restless legs syndrome)   AKI (acute kidney injury) (HCC)   Anemia   PVD (peripheral vascular disease) (HCC)  Bilateral ankle fractures -Following mechanical fall, unfortunately sustained right ankle bimalleolar displaced fracture and nondisplaced distal fibular fracture on the left  -Orthopedics consulting, aspirin and Plavix were held, initial plan was for ORIF this week -Seen by Dr. Sharol Given today, felt risk of surgery was too high in the setting of peripheral vascular disease and severe osteoporosis, recommended nonsurgical  management, nonweightbearing on both lower extremities for 3 weeks, may apply pressure through both lower extremities for transfers only, recommended follow-up in 2 weeks, continue splint for now -[facility] consult, will need short-term SNF -Started Eliquis for new onset A. fib   Atrial flutter/A. Fib -Detected on telemetry from 11/11 heart rate controlled, on current regimen of Lopressor.   -Back in normal sinus rhythm.  -chads2vasc score >3, started on Eliquis this admission for anticoagulation.   Acute renal failure Creatinine on admission elevated 2.21, etiology likely secondary to prerenal from poor oral intake/dehydration in the setting of ACE inhibitor and diuretics.  Baseline creatinine 1.2.   -- Renal function improving. -Repeat labs in the a.m.   Hyperkalemia -Potassium of 4.3.   -Status post Lokelma.   -ACE inhibitor on hold.  -Repeat labs in the morning.     CAD s/p CABG Chronic diastolic CHF -- Currently stable.  Euvolemic.   -Was on aspirin and Plavix prior to admission which was subsequently discontinued and patient currently on Eliquis.   -Continue Metroprolol.   -Benazepril and Lasix resumed at a lower dose on discharge.   -Outpatient follow-up.     Essential hypertension -Continue metoprolol, Norvasc, hydralazine, Imdur.   -IV fluids discontinued.   -Outpatient follow-up.     Type 2 diabetes mellitus Hemoglobin A1c 6.5, well controlled.  Home regimen includes glimepiride 1 mg p.o. daily, Lantus 26 units subcutaneously daily. -- CBG 235 this morning.   -Continue current dose of Semglee 10 units daily.   -SSI.   -Follow.    Hypothyroidism -- Continue levothyroxine. -Outpatient follow-up.   Hx  CVA -- Aspirin and Plavix discontinued and patient placed on Eliquis.   -Patient noted to have allergy to statin.    RLS:  -Continue Lyrica, Sinemet.   Constipation -Patient having bowel movements on current bowel regimen. -Dulcolax suppository  x1.  Nausea -Patient with complaints this morning of just not feeling well. -Patient with complaints of nausea not relieved by Zofran. -Compazine 5 mg IV x1. -We will give her Dulcolax suppository. -Follow.     DVT prophylaxis: Eliquis Code Status: Full Family Communication: Updated patient.  No family at bedside. Disposition:   Status is: Inpatient  Remains inpatient appropriate because: Severity of illness       Consultants:  Orthopedics: Dr. Lajoyce Corners 10/16/2021 Orthopedics: Dr.Xu  Procedures: CT right ankle 10/12/2021 CT left ankle 10/15/2021 Plain films of bilateral ankles 10/12/2019 2 plain films of the pelvis 10/11/2021 Antimicrobials:  None   Subjective: Patient laying in bed.  States does not feel too well.  Patient with nausea states not relieved with IV Zofran.  No chest pain.  No shortness of breath.  No abdominal pain  Objective: Vitals:   10/18/21 1948 10/19/21 0521 10/19/21 0744 10/19/21 1139  BP: (!) 136/48 (!) 151/48 (!) 151/54 (!) 130/94  Pulse: 60 62 67 (!) 119  Resp: 17 15 17 18   Temp: 97.8 F (36.6 C) 97.9 F (36.6 C) 98 F (36.7 C) 98.2 F (36.8 C)  TempSrc: Oral   Oral  SpO2: 100% 99% 99% 96%  Weight:      Height:        Intake/Output Summary (Last 24 hours) at 10/19/2021 1255 Last data filed at 10/19/2021 0615 Gross per 24 hour  Intake --  Output 500 ml  Net -500 ml   Filed Weights   10/11/21 1805  Weight: 95.3 kg    Examination:  General exam: Appears calm and comfortable  Respiratory system: Clear to auscultation anterior lung fields. Respiratory effort normal. Cardiovascular system: S1 & S2 heard, RRR. No JVD, murmurs, rubs, gallops or clicks. No pedal edema. Gastrointestinal system: Abdomen is nondistended, soft and nontender. No organomegaly or masses felt. Normal bowel sounds heard. Central nervous system: Alert and oriented. No focal neurological deficits. Extremities: Bilateral lower extremities wrapped in  splints/bandages.  Skin: No rashes, lesions or ulcers Psychiatry: Judgement and insight appear normal. Mood & affect appropriate.     Data Reviewed: I have personally reviewed following labs and imaging studies  CBC: Recent Labs  Lab 10/13/21 0324 10/14/21 0339 10/15/21 0140 10/17/21 0326  WBC 11.3* 12.0* 11.5* 10.1  HGB 12.0 11.8* 10.7* 11.1*  HCT 35.6* 37.2 32.9* 34.9*  MCV 95.7 97.4 96.5 98.0  PLT 133* 160 158 207    Basic Metabolic Panel: Recent Labs  Lab 10/13/21 0324 10/14/21 0339 10/15/21 0140 10/17/21 0326 10/17/21 1552 10/18/21 0200  NA 133* 132* 132* 131* 131* 128*  K 4.4 4.3 4.4 5.3* 4.7 4.3  CL 106 103 104 102 101 100  CO2 20* 22 22 20* 24 20*  GLUCOSE 173* 161* 133* 183* 193* 195*  BUN 23 19 25* 31* 28* 27*  CREATININE 1.14* 1.13* 1.14* 1.27* 1.28* 1.14*  CALCIUM 8.1* 8.3* 8.0* 8.1* 8.1* 7.9*  MG 1.6*  --   --   --   --   --     GFR: Estimated Creatinine Clearance: 42 mL/min (A) (by C-G formula based on SCr of 1.14 mg/dL (H)).  Liver Function Tests: No results for input(s): AST, ALT, ALKPHOS, BILITOT, PROT, ALBUMIN in the last  168 hours.  CBG: Recent Labs  Lab 10/18/21 1159 10/18/21 1716 10/18/21 1950 10/19/21 0752 10/19/21 1138  GLUCAP 298* 198* 172* 235* 242*     Recent Results (from the past 240 hour(s))  Resp Panel by RT-PCR (Flu A&B, Covid) Nasopharyngeal Swab     Status: None   Collection Time: 10/11/21 10:19 PM   Specimen: Nasopharyngeal Swab; Nasopharyngeal(NP) swabs in vial transport medium  Result Value Ref Range Status   SARS Coronavirus 2 by RT PCR NEGATIVE NEGATIVE Final    Comment: (NOTE) SARS-CoV-2 target nucleic acids are NOT DETECTED.  The SARS-CoV-2 RNA is generally detectable in upper respiratory specimens during the acute phase of infection. The lowest concentration of SARS-CoV-2 viral copies this assay can detect is 138 copies/mL. A negative result does not preclude SARS-Cov-2 infection and should not be used  as the sole basis for treatment or other patient management decisions. A negative result may occur with  improper specimen collection/handling, submission of specimen other than nasopharyngeal swab, presence of viral mutation(s) within the areas targeted by this assay, and inadequate number of viral copies(<138 copies/mL). A negative result must be combined with clinical observations, patient history, and epidemiological information. The expected result is Negative.  Fact Sheet for Patients:  EntrepreneurPulse.com.au  Fact Sheet for Healthcare Providers:  IncredibleEmployment.be  This test is no t yet approved or cleared by the [place] FDA and  has been authorized for detection and/or diagnosis of SARS-CoV-2 by FDA under an Emergency Use Authorization (EUA). This EUA will remain  in effect (meaning this test can be used) for the duration of the COVID-19 declaration under Section 564(b)(1) of the Act, 21 U.S.C.section 360bbb-3(b)(1), unless the authorization is terminated  or revoked sooner.       Influenza A by PCR NEGATIVE NEGATIVE Final   Influenza B by PCR NEGATIVE NEGATIVE Final    Comment: (NOTE) The Xpert Xpress SARS-CoV-2/FLU/RSV plus assay is intended as an aid in the diagnosis of influenza from Nasopharyngeal swab specimens and should not be used as a sole basis for treatment. Nasal washings and aspirates are unacceptable for Xpert Xpress SARS-CoV-2/FLU/RSV testing.  Fact Sheet for Patients: EntrepreneurPulse.com.au  Fact Sheet for Healthcare Providers: IncredibleEmployment.be  This test is not yet approved or cleared by the [place] FDA and has been authorized for detection and/or diagnosis of SARS-CoV-2 by FDA under an Emergency Use Authorization (EUA). This EUA will remain in effect (meaning this test can be used) for the duration of the COVID-19 declaration under Section 564(b)(1)  of the Act, 21 U.S.C. section 360bbb-3(b)(1), unless the authorization is terminated or revoked.  Performed at [facility], [place] [address] [place], [place] [address]   SARS CORONAVIRUS 2 (TAT 6-24 HRS) Nasopharyngeal Nasopharyngeal Swab     Status: None   Collection Time: 10/18/21  6:35 PM   Specimen: Nasopharyngeal Swab  Result Value Ref Range Status   SARS Coronavirus 2 NEGATIVE NEGATIVE Final    Comment: (NOTE) SARS-CoV-2 target nucleic acids are NOT DETECTED.  The SARS-CoV-2 RNA is generally detectable in upper and lower respiratory specimens during the acute phase of infection. Negative results do not preclude SARS-CoV-2 infection, do not rule out co-infections with other pathogens, and should not be used as the sole basis for treatment or other patient management decisions. Negative results must be combined with clinical observations, patient history, and epidemiological information. The expected result is Negative.  Fact Sheet for Patients: SugarRoll.be  Fact Sheet for Healthcare Providers: https://www.woods-mathews.com/  This  test is not yet approved or cleared by the [place] and  has been authorized for detection and/or diagnosis of SARS-CoV-2 by FDA under an Emergency Use Authorization (EUA). This EUA will remain  in effect (meaning this test can be used) for the duration of the COVID-19 declaration under Se ction 564(b)(1) of the Act, 21 U.S.C. section 360bbb-3(b)(1), unless the authorization is terminated or revoked sooner.  Performed at [facility], [place] [address] [place], [place] [address]          Radiology Studies: No results found.      Scheduled Meds:  amLODipine  5 mg Oral Daily   apixaban  5 mg Oral BID   carbidopa-levodopa  1.5 tablet Oral QHS   hydrALAZINE  50 mg Oral TID   insulin aspart  0-15 Units Subcutaneous TID WC   insulin aspart  0-5 Units  Subcutaneous QHS   insulin glargine-yfgn  13 Units Subcutaneous Daily   isosorbide mononitrate  60 mg Oral Daily   levothyroxine  100 mcg Oral QAC breakfast   metoprolol succinate  25 mg Oral Daily   nystatin   Topical TID   polyethylene glycol  17 g Oral BID   pregabalin  50 mg Oral BID   senna-docusate  2 tablet Oral BID   Continuous Infusions:   LOS: 7 days    Time spent: 40 minutes    Irine Seal, MD [facility]   To contact the attending provider between 7A-7P or the covering provider during after hours 7P-7A, please log into the web site [email or web address] and access using universal [place] password for that web site. If you do not have the password, please call the hospital operator.  10/19/2021, 12:55 PM

## 2021-10-20 ENCOUNTER — Inpatient Hospital Stay (HOSPITAL_COMMUNITY): Payer: Medicare Other

## 2021-10-20 DIAGNOSIS — N390 Urinary tract infection, site not specified: Secondary | ICD-10-CM

## 2021-10-20 DIAGNOSIS — D72829 Elevated white blood cell count, unspecified: Secondary | ICD-10-CM | POA: Diagnosis not present

## 2021-10-20 DIAGNOSIS — D649 Anemia, unspecified: Secondary | ICD-10-CM

## 2021-10-20 LAB — BASIC METABOLIC PANEL
Anion gap: 7 (ref 5–15)
BUN: 35 mg/dL — ABNORMAL HIGH (ref 8–23)
CO2: 25 mmol/L (ref 22–32)
Calcium: 8.3 mg/dL — ABNORMAL LOW (ref 8.9–10.3)
Chloride: 99 mmol/L (ref 98–111)
Creatinine, Ser: 1.45 mg/dL — ABNORMAL HIGH (ref 0.44–1.00)
GFR, Estimated: 35 mL/min — ABNORMAL LOW (ref >60)
Glucose, Bld: 128 mg/dL — ABNORMAL HIGH (ref 70–99)
Potassium: 4.3 mmol/L (ref 3.5–5.1)
Sodium: 131 mmol/L — ABNORMAL LOW (ref 135–145)

## 2021-10-20 LAB — GLUCOSE, CAPILLARY
Glucose-Capillary: 132 mg/dL — ABNORMAL HIGH (ref 70–99)
Glucose-Capillary: 154 mg/dL — ABNORMAL HIGH (ref 70–99)
Glucose-Capillary: 238 mg/dL — ABNORMAL HIGH (ref 70–99)
Glucose-Capillary: 281 mg/dL — ABNORMAL HIGH (ref 70–99)
Glucose-Capillary: 246 mg/dL — ABNORMAL HIGH (ref 70–99)

## 2021-10-20 LAB — URINALYSIS, ROUTINE W REFLEX MICROSCOPIC
Bilirubin Urine: NEGATIVE
Glucose, UA: NEGATIVE mg/dL
Ketones, ur: 5 mg/dL — AB
Nitrite: NEGATIVE
Protein, ur: 100 mg/dL — AB
Specific Gravity, Urine: 1.026 (ref 1.005–1.030)
WBC, UA: >50 WBC/hpf — ABNORMAL HIGH (ref 0–5)
pH: 5 (ref 5.0–8.0)

## 2021-10-20 LAB — CBC
HCT: 34 % — ABNORMAL LOW (ref 36.0–46.0)
Hemoglobin: 11.1 g/dL — ABNORMAL LOW (ref 12.0–15.0)
MCH: 31.4 pg (ref 26.0–34.0)
MCHC: 32.6 g/dL (ref 30.0–36.0)
MCV: 96 fL (ref 80.0–100.0)
Platelets: 244 10*3/uL (ref 150–400)
RBC: 3.54 MIL/uL — ABNORMAL LOW (ref 3.87–5.11)
RDW: 14.1 % (ref 11.5–15.5)
WBC: 21.8 10*3/uL — ABNORMAL HIGH (ref 4.0–10.5)
nRBC: 0 % (ref 0.0–0.2)

## 2021-10-20 LAB — MAGNESIUM: Magnesium: 1.8 mg/dL (ref 1.7–2.4)

## 2021-10-20 MED ORDER — SODIUM CHLORIDE 0.9 % IV SOLN
INTRAVENOUS | Status: AC
Start: 2021-10-20 — End: 2021-10-22

## 2021-10-20 MED ORDER — SODIUM CHLORIDE 0.9 % IV SOLN
1.0000 g | INTRAVENOUS | Status: DC
  Administered 2021-10-20 – 2021-10-21 (×2): 1 g via INTRAVENOUS
  Filled 2021-10-20 (×2): qty 10

## 2021-10-20 NOTE — Progress Notes (Signed)
OT Cancellation Note  Patient Details Name: Sierra Wright MRN: [id number] DOB: Apr 19, 1934   Cancelled Treatment:    Reason Eval/Treat Not Completed: Fatigue/lethargy limiting ability to participate. Pt and family in room, pt unable to keep eyes open during conversation, states she is in a lot of pain at this time. Will follow up as schedule allows.  Alfonzo Beers, OTD, OTR/L Acute Rehab [phone]   Mayer Masker  10/20/2021, 1:17 PM

## 2021-10-20 NOTE — Progress Notes (Signed)
PROGRESS NOTE    Sierra Wright  NWG:[id number] DOB: 24-Aug-1934 DOA: 10/11/2021 PCP: [name], MD    Chief Complaint  Patient presents with   Ankle Pain    Mechanical fall witnessed, right ankle deformity.   Diarrhea    X several days   Hip Pain    Left hip pain hurting even prior to fall, pt has had this worked up in the past.    Brief Narrative:  Sierra Wright is an 87/F with history of CAD/CABG, chronic diastolic CHF, type 2 diabetes mellitus, CVA, hypertension, hypothyroidism, osteoarthritis presented to [name], ED on 11/9 from her PCPs office following a mechanical fall with history of pain to both ankles, reports that her legs gave out prior to her fall, denied any loss of consciousness. -In the emergency room vital signs were stable, labs noted creatinine of 2.2 pelvic x-rays were unremarkable, right ankle x-ray with bimalleolar acute displaced fracture, cortical irregularity medial talus -Left ankle x-ray with indeterminate nondisplaced distal fibular fracture with lateral ankle subcutaneous soft tissue edema.  EDP attempted manual reduction right ankle but was unsuccessful.  Orthopedics was consulted.,  Aspirin and Plavix were held she was transferred to [facility] for surgery early next week -11/11 noted to be in A. Fib/atrial flutter   Assessment & Plan:   Principal Problem:   Ankle fracture Active Problems:   Leukocytosis   Essential hypertension   RLS (restless legs syndrome)   AKI (acute kidney injury) (HCC)   Anemia   PVD (peripheral vascular disease) (HCC)   Nausea  Bilateral ankle fractures -Following mechanical fall, unfortunately sustained right ankle bimalleolar displaced fracture and nondisplaced distal fibular fracture on the left  -Orthopedics consulting, aspirin and Plavix were held, initial plan was for ORIF this week -Seen by Dr. Lajoyce Corners today, felt risk of surgery was too high in the setting of peripheral vascular disease and severe  osteoporosis, recommended nonsurgical management, nonweightbearing on both lower extremities for 3 weeks, may apply pressure through both lower extremities for transfers only, recommended follow-up in 2 weeks, continue splint for now -[facility] consult, will need short-term SNF -Started Eliquis for new onset A. Fib.  Leukocytosis -Patient noted with a significant leukocytosis with white count of 21.8 from 10.1(10/17/2021). -Patient with complaints of dysuria, suprapubic lower abdominal pain, nausea. -Urinalysis ordered and done this morning concerning for UTI with large leukocytes, nitrite negative, many bacteria, > 50 wbcs. -Patient symptomatic. -Urine cultures pending. -Chest x-ray negative for any acute abnormalities. -Blood cultures pending. -Start IV Rocephin pending urine cultures.  UTI -Patient noted to have a significant leukocytosis of 21.8 today from 10.1(10/17/2021). -Patient symptomatic with dysuria, suprapubic lower abdominal pain, and nausea. -Urinalysis done this morning concerning for UTI. -Urine cultures pending. -Start IV Rocephin.   Atrial flutter/A. Fib -Detected on telemetry from 11/11 heart rate controlled, on current regimen of Lopressor.   -Back in normal sinus rhythm.  -chads2vasc score >3, started on Eliquis this admission for anticoagulation.   Acute renal failure Creatinine on admission elevated 2.21, etiology likely secondary to prerenal from poor oral intake/dehydration in the setting of ACE inhibitor and diuretics.  Baseline creatinine 1.2.   -- Renal function fluctuating, creatinine currently at 1.45.   -Gentle hydration.     Hyperkalemia -Potassium at 4.3.   -Status post Lokelma.   -Continue to hold ACE inhibitor.   -Repeat labs in the a.m.    CAD s/p CABG Chronic diastolic CHF -- Euvolemic.    -Was on aspirin and Plavix  prior to admission which was subsequently discontinued and patient currently on Eliquis.   -Continue Metroprolol, hydralazine,  Imdur.   -Benazepril and Lasix resumed at a lower dose on discharge.   -Outpatient follow-up.     Essential hypertension -Blood pressure controlled on current regimen of metoprolol, Norvasc, hydralazine, Imdur.   -Outpatient follow-up.     Type 2 diabetes mellitus Hemoglobin A1c 6.5, well controlled.  Home regimen includes glimepiride 1 mg p.o. daily, Lantus 26 units subcutaneously daily. -- CBG 154 this morning.   -Continue Semglee, SSI.   -Follow.   Hypothyroidism -- Levothyroxine.  -Outpatient follow-up.     Hx CVA -- Aspirin and Plavix discontinued and patient placed on Eliquis.   -Patient noted to have allergy to statin.    RLS:  -Continue Sinemet, Lyrica.   Constipation -Patient having bowel movements on current bowel regimen. -Follow.  Nausea -Patient with complaints of nausea 10/19/2021 likely secondary to UTI as patient today with complaints of dysuria and suprapubic abdominal pain. -Patient with clinical improvement on Compazine.  -Placed on empiric IV Rocephin for UTI pending urine cultures.  -IV antibiotics, supportive care.      DVT prophylaxis: Eliquis Code Status: Full Family Communication: Updated patient.  Updated husband and daughter at bedside. Disposition:   Status is: Inpatient  Remains inpatient appropriate because: Severity of illness       Consultants:  Orthopedics: Dr. Sharol Given 10/16/2021 Orthopedics: Dr.Xu  Procedures: CT right ankle 10/12/2021 CT left ankle 10/15/2021 Plain films of bilateral ankles 10/12/2019 2 plain films of the pelvis 10/11/2021 Chest x-ray 10/20/2021 Antimicrobials:  IV Rocephin 10/20/2021>>>>>   Subjective: Patient laying in bed.  Complaining of bilateral ankle pain.  Complain of being stuck for IV.  Complain of lower abdominal pain, dysuria.  Nausea improved.    Objective: Vitals:   10/19/21 1759 10/19/21 1808 10/19/21 2009 10/20/21 0820  BP: (!) 128/48 106/69 (!) 102/59 140/61  Pulse: 64 65 74 (!) 58   Resp: 17 18 15 18   Temp: 99.3 F (37.4 C)  98.3 F (36.8 C) 98 F (36.7 C)  TempSrc: Oral   Oral  SpO2: 98% 95% 96% 96%  Weight:      Height:        Intake/Output Summary (Last 24 hours) at 10/20/2021 1252 Last data filed at 10/20/2021 1116 Gross per 24 hour  Intake --  Output 240 ml  Net -240 ml    Filed Weights   10/11/21 1805  Weight: 95.3 kg    Examination:  General exam: NAD Respiratory system: Lungs clear to auscultation anterior lung fields.  No wheezes, no crackles, no rhonchi.  Normal respiratory effort.  Cardiovascular system: Regular rate rhythm no murmurs rubs or gallop no lower extremity edema. Gastrointestinal system: Abdomen is nondistended, soft and tender to palpation in the suprapubic region.  No organomegaly or masses felt. Normal bowel sounds heard. Central nervous system: Alert and oriented. No focal neurological deficits. Extremities: Bilateral lower extremities wrapped in splints/bandages.  Skin: No rashes, lesions or ulcers Psychiatry: Judgement and insight appear normal. Mood & affect appropriate.     Data Reviewed: I have personally reviewed following labs and imaging studies  CBC: Recent Labs  Lab 10/14/21 0339 10/15/21 0140 10/17/21 0326 10/20/21 0216  WBC 12.0* 11.5* 10.1 21.8*  HGB 11.8* 10.7* 11.1* 11.1*  HCT 37.2 32.9* 34.9* 34.0*  MCV 97.4 96.5 98.0 96.0  PLT 160 158 207 244     Basic Metabolic Panel: Recent Labs  Lab 10/15/21 0140 10/17/21  [id number] 10/17/21 1552 10/18/21 0200 10/20/21 0216  NA 132* 131* 131* 128* 131*  K 4.4 5.3* 4.7 4.3 4.3  CL 104 102 101 100 99  CO2 22 20* 24 20* 25  GLUCOSE 133* 183* 193* 195* 128*  BUN 25* 31* 28* 27* 35*  CREATININE 1.14* 1.27* 1.28* 1.14* 1.45*  CALCIUM 8.0* 8.1* 8.1* 7.9* 8.3*  MG  --   --   --   --  1.8     GFR: Estimated Creatinine Clearance: 33 mL/min (A) (by C-G formula based on SCr of 1.45 mg/dL (H)).  Liver Function Tests: No results for input(s): AST, ALT,  ALKPHOS, BILITOT, PROT, ALBUMIN in the last 168 hours.  CBG: Recent Labs  Lab 10/19/21 1618 10/19/21 2010 10/20/21 0401 10/20/21 0733 10/20/21 1205  GLUCAP 109* 76 132* 154* 238*      Recent Results (from the past 240 hour(s))  Resp Panel by RT-PCR (Flu A&B, Covid) Nasopharyngeal Swab     Status: None   Collection Time: 10/11/21 10:19 PM   Specimen: Nasopharyngeal Swab; Nasopharyngeal(NP) swabs in vial transport medium  Result Value Ref Range Status   SARS Coronavirus 2 by RT PCR NEGATIVE NEGATIVE Final    Comment: (NOTE) SARS-CoV-2 target nucleic acids are NOT DETECTED.  The SARS-CoV-2 RNA is generally detectable in upper respiratory specimens during the acute phase of infection. The lowest concentration of SARS-CoV-2 viral copies this assay can detect is 138 copies/mL. A negative result does not preclude SARS-Cov-2 infection and should not be used as the sole basis for treatment or other patient management decisions. A negative result may occur with  improper specimen collection/handling, submission of specimen other than nasopharyngeal swab, presence of viral mutation(s) within the areas targeted by this assay, and inadequate number of viral copies(<138 copies/mL). A negative result must be combined with clinical observations, patient history, and epidemiological information. The expected result is Negative.  Fact Sheet for Patients:  EntrepreneurPulse.com.au  Fact Sheet for Healthcare Providers:  IncredibleEmployment.be  This test is no t yet approved or cleared by the [place] FDA and  has been authorized for detection and/or diagnosis of SARS-CoV-2 by FDA under an Emergency Use Authorization (EUA). This EUA will remain  in effect (meaning this test can be used) for the duration of the COVID-19 declaration under Section 564(b)(1) of the Act, 21 U.S.C.section 360bbb-3(b)(1), unless the authorization is terminated  or revoked  sooner.       Influenza A by PCR NEGATIVE NEGATIVE Final   Influenza B by PCR NEGATIVE NEGATIVE Final    Comment: (NOTE) The Xpert Xpress SARS-CoV-2/FLU/RSV plus assay is intended as an aid in the diagnosis of influenza from Nasopharyngeal swab specimens and should not be used as a sole basis for treatment. Nasal washings and aspirates are unacceptable for Xpert Xpress SARS-CoV-2/FLU/RSV testing.  Fact Sheet for Patients: EntrepreneurPulse.com.au  Fact Sheet for Healthcare Providers: IncredibleEmployment.be  This test is not yet approved or cleared by the [place] FDA and has been authorized for detection and/or diagnosis of SARS-CoV-2 by FDA under an Emergency Use Authorization (EUA). This EUA will remain in effect (meaning this test can be used) for the duration of the COVID-19 declaration under Section 564(b)(1) of the Act, 21 U.S.C. section 360bbb-3(b)(1), unless the authorization is terminated or revoked.  Performed at [facility], [place] [address], [place], [place] [address]   SARS CORONAVIRUS 2 (TAT 6-24 HRS) Nasopharyngeal Nasopharyngeal Swab     Status: None   Collection Time: 10/18/21  6:35 PM   Specimen: Nasopharyngeal Swab  Result Value Ref Range Status   SARS Coronavirus 2 NEGATIVE NEGATIVE Final    Comment: (NOTE) SARS-CoV-2 target nucleic acids are NOT DETECTED.  The SARS-CoV-2 RNA is generally detectable in upper and lower respiratory specimens during the acute phase of infection. Negative results do not preclude SARS-CoV-2 infection, do not rule out co-infections with other pathogens, and should not be used as the sole basis for treatment or other patient management decisions. Negative results must be combined with clinical observations, patient history, and epidemiological information. The expected result is Negative.  Fact Sheet for Patients: HairSlick.nohttps://www.fda.gov/media/138098/download  Fact  Sheet for Healthcare Providers: quierodirigir.comhttps://www.fda.gov/media/138095/download  This test is not yet approved or cleared by the [place] FDA and  has been authorized for detection and/or diagnosis of SARS-CoV-2 by FDA under an Emergency Use Authorization (EUA). This EUA will remain  in effect (meaning this test can be used) for the duration of the COVID-19 declaration under Se ction 564(b)(1) of the Act, 21 U.S.C. section 360bbb-3(b)(1), unless the authorization is terminated or revoked sooner.  Performed at [facility], [address] [place], [place] [address]           Radiology Studies: DG CHEST PORT 1 VIEW  Result Date: 10/20/2021 CLINICAL DATA:  Leukocytosis, constipation EXAM: PORTABLE CHEST 1 VIEW COMPARISON:  Chest radiograph 01/20/2020 FINDINGS: Median sternotomy wires and mediastinal surgical clips are again seen. The heart is at the upper limits of normal for size. There is calcified atherosclerotic plaque of the aortic arch. The mediastinal contours are normal. Linear opacities in the left mid lung and base likely reflect atelectasis or scar. There is no focal consolidation. There is no pulmonary edema. There is no pleural effusion or pneumothorax. There is degenerative change of the right shoulder. There is no acute osseous abnormality. IMPRESSION: 1. Left basilar atelectasis or scar. No radiographic evidence of acute cardiopulmonary process. 2. Unchanged borderline cardiomegaly. Electronically Signed   By: Lesia HausenPeter  Noone M.D.   On: 10/20/2021 10:04   DG Abd Portable 1V  Result Date: 10/20/2021 CLINICAL DATA:  Leukocytosis, constipation EXAM: PORTABLE ABDOMEN - 1 VIEW COMPARISON:  None. FINDINGS: There is a nonobstructive bowel gas pattern. There is no abnormally large colonic stool burden. There is no definite free intraperitoneal air, suboptimally evaluated given supine technique. Right upper quadrant and midline upper abdominal surgical clips are noted. There is  no gross organomegaly or abnormal soft tissue calcification. There is multilevel degenerative change of the lumbar spine and marked degenerative change of the left hip. IMPRESSION: Nonobstructive bowel gas pattern. No abnormally large colonic stool burden. Electronically Signed   By: Lesia HausenPeter  Noone M.D.   On: 10/20/2021 10:05        Scheduled Meds:  amLODipine  5 mg Oral Daily   apixaban  5 mg Oral BID   bisacodyl  10 mg Rectal Once   carbidopa-levodopa  1.5 tablet Oral QHS   hydrALAZINE  50 mg Oral TID   insulin aspart  0-15 Units Subcutaneous TID WC   insulin aspart  0-5 Units Subcutaneous QHS   insulin glargine-yfgn  13 Units Subcutaneous Daily   isosorbide mononitrate  60 mg Oral Daily   levothyroxine  100 mcg Oral QAC breakfast   metoprolol succinate  25 mg Oral Daily   nystatin   Topical TID   polyethylene glycol  17 g Oral BID   pregabalin  50 mg Oral BID   senna-docusate  2 tablet Oral  BID   Continuous Infusions:  sodium chloride 75 mL/hr at 10/20/21 0945     LOS: 8 days    Time spent: 40 minutes    Irine Seal, MD [facility]   To contact the attending provider between 7A-7P or the covering provider during after hours 7P-7A, please log into the web site [email or web address] and access using universal [place] password for that web site. If you do not have the password, please call the hospital operator.  10/20/2021, 12:52 PM

## 2021-10-20 NOTE — Progress Notes (Signed)
Physical Therapy Treatment Patient Details Name: Sierra Wright MRN: [id number] DOB: 1934-02-20 Today's Date: 10/20/2021   History of Present Illness 87 yo female with onset of fall in MD office was brought to hosp on 11/9, now determined to be inappropriate for surgery to do surgery for ankle fractures.  Has trimalleolar fracture on R ankle, L has minimally displaced distal fib fracture, anterior inferior tibiobifular ligament avulsion fracture distal tibia.  NWB orders for now B feet, expected to go to SNF.  PMHx:  a-flutter, a-fib, CAD, CABG, atherosclerosis, DM, HTN, PVD, depression, PN, HOH, SOB, hypothyroidism,    PT Comments    Focused session on progressing pt to OOB transfers. PT was successful with anterior-posterior transfer approach bed > recliner, but pt limited in assistance due to pain. However, despite pain, pt agreeable to session. Pt required maxAx2 for bed mobility and TA x2-3 for the transfer this date. Pt with a lot of pain sitting in recliner, but eventually appeared to make pt more comfortable with re-positioning. Coordinated with RN and NT to try to allow pt to sit up at least 30 minutes as pt tolerates. Will continue to follow acutely. Current recommendations remain appropriate.    Recommendations for follow up therapy are one component of a multi-disciplinary discharge planning process, led by the attending physician.  Recommendations may be updated based on patient status, additional functional criteria and insurance authorization.  Follow Up Recommendations  Skilled nursing-short term rehab (<3 hours/day)     Assistance Recommended at Discharge Frequent or constant Supervision/Assistance  Equipment Recommendations  Wheelchair (measurements PT);Other (comment) (slide board)    Recommendations for Other Services       Precautions / Restrictions Precautions Precautions: Fall Precaution Comments: NWB for now on B feet Required Braces or Orthoses:  Splint/Cast Splint/Cast: B ankles Restrictions Weight Bearing Restrictions: Yes RLE Weight Bearing: Non weight bearing LLE Weight Bearing: Non weight bearing Other Position/Activity Restrictions: Per Dr. Lajoyce Corners:  Ideally nonweightbearing on the right for 3 weeks.  However if patient needs to place some weight on the right heel for transfers that is okay.  May place weightbearing as needed on the left  for transfers.  No gait training for 3 weeks.     Mobility  Bed Mobility Overal bed mobility: Needs Assistance Bed Mobility: Supine to Sit     Supine to sit: Max assist;+2 for physical assistance;HOB elevated     General bed mobility comments: Daughter assisting in managing legs once off EOB to maintin them in elevation to reduce pain as PT used bed pad to pivot pt's buttocks, maxAx2 to sit up from elevated HOB.    Transfers Overall transfer level: Needs assistance Equipment used: None Transfers: Bed to chair/wheelchair/BSC         Anterior-Posterior transfers: Total assist;+2 physical assistance;+2 safety/equipment;From elevated surface   General transfer comment: Anterior - posterior transfer from elevated bed > recliner using bed pad, daughter supporting legs and PT pulling pt posteriorly TA, requested NT to provide additional assistance once in chair to pull pt further back in chair to improve posture.    Ambulation/Gait               General Gait Details: No gait training for 3 weeks.   Stairs             Wheelchair Mobility    Modified Rankin (Stroke Patients Only)       Balance Overall balance assessment: Needs assistance Sitting-balance support: Bilateral upper extremity supported;Feet unsupported Sitting  balance-Leahy Scale: Zero Sitting balance - Comments: MaxA and bil UE support sitting sideways in bed with legs supported by daughter, pt pushing posteriorly and to R due to L hip pain. Postural control: Posterior lean;Right lateral lean      Standing balance comment: Unable                            Cognition Arousal/Alertness: Awake/alert Behavior During Therapy: Anxious;Flat affect Overall Cognitive Status: Within Functional Limits for tasks assessed                                 General Comments: Pt with flat affect, not thrilled about mobilizing but agreeable despite pain.        Exercises      General Comments General comments (skin integrity, edema, etc.): Coordinated with RN and NT to have pt sit up at least 30 min if tolerable, pt much more calm with less evident pain by time PT left room      Pertinent Vitals/Pain Pain Assessment: Faces Faces Pain Scale: Hurts whole lot Pain Location: B LEs, buttocks, L hip Pain Descriptors / Indicators: Constant;Discomfort;Grimacing;Guarding;Moaning Pain Intervention(s): Limited activity within patient's tolerance;Monitored during session;Repositioned;Patient requesting pain meds-RN notified    Home Living                          Prior Function            PT Goals (current goals can now be found in the care plan section) Acute Rehab PT Goals Patient Stated Goal: to get better PT Goal Formulation: With patient/family Time For Goal Achievement: 10/30/21 Potential to Achieve Goals: Good Progress towards PT goals: Progressing toward goals    Frequency    Min 3X/week      PT Plan Current plan remains appropriate    Co-evaluation              AM-PAC PT "6 Clicks" Mobility   Outcome Measure  Help needed turning from your back to your side while in a flat bed without using bedrails?: A Lot Help needed moving from lying on your back to sitting on the side of a flat bed without using bedrails?: Total Help needed moving to and from a bed to a chair (including a wheelchair)?: Total Help needed standing up from a chair using your arms (e.g., wheelchair or bedside chair)?: Total Help needed to walk in hospital room?:  Total Help needed climbing 3-5 steps with a railing? : Total 6 Click Score: 7    End of Session   Activity Tolerance: Patient limited by pain Patient left: in chair;with call bell/phone within reach;with chair alarm set;with family/visitor present (hoyer pad under pt) Nurse Communication: Mobility status;Need for lift equipment PT Visit Diagnosis: Muscle weakness (generalized) (M62.81);Pain;History of falling (Z91.81);Difficulty in walking, not elsewhere classified (R26.2) Pain - Right/Left:  (bil) Pain - part of body: Ankle and joints of foot     Time: 1622-1700 PT Time Calculation (min) (ACUTE ONLY): 38 min  Charges:  $Therapeutic Activity: 38-52 mins                     Moishe Spice, PT, DPT Acute Rehabilitation Services  Pager: [phone] Office: [phone]    Orvan Falconer 10/20/2021, 5:08 PM

## 2021-10-20 NOTE — Plan of Care (Signed)
  Problem: Safety: Goal: Ability to remain free from injury will improve Outcome: Progressing Will apply bed alarms and safety round frequently.

## 2021-10-20 NOTE — Progress Notes (Signed)
Physical Therapy Treatment Patient Details Name: Sierra Wright MRN: [id number] DOB: 1934/11/13 Today's Date: 10/20/2021   History of Present Illness 87 yo female with onset of fall in MD office was brought to hosp on 11/9, now determined to be inappropriate for surgery to do surgery for ankle fractures.  Has trimalleolar fracture on R ankle, L has minimally displaced distal fib fracture, anterior inferior tibiobifular ligament avulsion fracture distal tibia.  NWB orders for now B feet, expected to go to SNF.  PMHx:  a-flutter, a-fib, CAD, CABG, atherosclerosis, DM, HTN, PVD, depression, PN, HOH, SOB, hypothyroidism,    PT Comments    Pt requesting to return to bed as she sat up for the agreed amount of time of ~30 min. NT assisted PT in anterior-posterior transfer recliner > bed using bed pads, Tax3 with daughter supporting pt's legs. Recliner did slide during the transfer, causing pt to get stuck half on the bed and half on the recliner during the transfer, requiring readjustment by the PT and NT to accommodate for this obstacle. Pt very anxious and resisting at a point also. Once pt was successfully and safely supine and aligned in bed she quickly calmed down and denied any new pain, just the L hip pain that was present prior to her first PT session today. Will continue to follow acutely. Current recommendations remain appropriate.    Recommendations for follow up therapy are one component of a multi-disciplinary discharge planning process, led by the attending physician.  Recommendations may be updated based on patient status, additional functional criteria and insurance authorization.  Follow Up Recommendations  Skilled nursing-short term rehab (<3 hours/day)     Assistance Recommended at Discharge Frequent or constant Supervision/Assistance  Equipment Recommendations  Wheelchair (measurements PT);Other (comment) (slide board)    Recommendations for Other Services       Precautions /  Restrictions Precautions Precautions: Fall Precaution Comments: NWB for now on B feet Required Braces or Orthoses: Splint/Cast Splint/Cast: B ankles Restrictions Weight Bearing Restrictions: Yes RLE Weight Bearing: Non weight bearing LLE Weight Bearing: Non weight bearing Other Position/Activity Restrictions: Per Dr. Sharol Given:  Ideally nonweightbearing on the right for 3 weeks.  However if patient needs to place some weight on the right heel for transfers that is okay.  May place weightbearing as needed on the left  for transfers.  No gait training for 3 weeks.     Mobility  Bed Mobility Overal bed mobility: Needs Assistance Bed Mobility: Sit to Supine     Supine to sit: Max assist;+2 for physical assistance;HOB elevated Sit to supine: +2 for physical assistance;+2 for safety/equipment;Total assist   General bed mobility comments: Pt transferred supine into bed from reclined chair with TAx2.    Transfers Overall transfer level: Needs assistance Equipment used: None Transfers: Bed to chair/wheelchair/BSC         Anterior-Posterior transfers: Total assist;+2 physical assistance;+2 safety/equipment;From elevated surface   General transfer comment: Pt requesting back to bed after being up ~30 min per agreement with PT, thus retrieved NT to assist PT in anterior-posterior transfer from reclined recliner > bed. Daughter assisting in supporting legs during transfer. TAx3 to use bed pad to pull pt posteriorly, but when trying to then pivot to align pt with bed her hips got stuck on the head of the recliner that had slid during the transfer further over top of the bed, pt's head and shoulders were on bed and pt became anxious, grabbing for the EOB with her UE, limiting  her from being able to be adjusted further. PT and NT then readjusted/repositioned to assess why pt was stuck half on bed and half on recliner then readjusted pt with use of bed pad successfully onto bed. Once pt supine in bed  with HOB elevated she became calm again with no moaning from pain, denying any pain in upper half of body just pain in L hip which was present before first session, Applied heating pad to L hip for pain relief.    Ambulation/Gait               General Gait Details: No gait training for 3 weeks.   Stairs             Wheelchair Mobility    Modified Rankin (Stroke Patients Only)       Balance Overall balance assessment: Needs assistance Sitting-balance support: Bilateral upper extremity supported;Feet unsupported Sitting balance-Leahy Scale: Zero Sitting balance - Comments: MaxA and bil UE support sitting sideways in bed with legs supported by daughter, pt pushing posteriorly and to R due to L hip pain. Postural control: Posterior lean;Right lateral lean     Standing balance comment: Unable                            Cognition Arousal/Alertness: Awake/alert Behavior During Therapy: Anxious;Flat affect Overall Cognitive Status: Within Functional Limits for tasks assessed                                 General Comments: Pt with flat affect, not thrilled about mobilizing but agreeable despite pain.        Exercises      General Comments General comments (skin integrity, edema, etc.): Coordinated with RN and NT to have pt sit up at least 30 min if tolerable, pt much more calm with less evident pain by time PT left room      Pertinent Vitals/Pain Pain Assessment: Faces Faces Pain Scale: Hurts whole lot Pain Location: B LEs, buttocks, L hip Pain Descriptors / Indicators: Constant;Discomfort;Grimacing;Guarding;Moaning Pain Intervention(s): Limited activity within patient's tolerance;Monitored during session;Repositioned;Premedicated before session    Home Living                          Prior Function            PT Goals (current goals can now be found in the care plan section) Acute Rehab PT Goals Patient Stated Goal:  to get back to bed PT Goal Formulation: With patient/family Time For Goal Achievement: 10/30/21 Potential to Achieve Goals: Good Progress towards PT goals: Progressing toward goals    Frequency    Min 3X/week      PT Plan Current plan remains appropriate    Co-evaluation              AM-PAC PT "6 Clicks" Mobility   Outcome Measure  Help needed turning from your back to your side while in a flat bed without using bedrails?: A Lot Help needed moving from lying on your back to sitting on the side of a flat bed without using bedrails?: Total Help needed moving to and from a bed to a chair (including a wheelchair)?: Total Help needed standing up from a chair using your arms (e.g., wheelchair or bedside chair)?: Total Help needed to walk in hospital room?: Total Help needed climbing  3-5 steps with a railing? : Total 6 Click Score: 7    End of Session   Activity Tolerance: Patient limited by pain Patient left: with call bell/phone within reach;with family/visitor present;in bed;with bed alarm set Nurse Communication: Mobility status;Other (comment) (askign for nausea meds, pt with no new pain following transfer) PT Visit Diagnosis: Muscle weakness (generalized) (M62.81);Pain;History of falling (Z91.81);Difficulty in walking, not elsewhere classified (R26.2) Pain - Right/Left:  (bil) Pain - part of body: Ankle and joints of foot     Time: 1715-1730 PT Time Calculation (min) (ACUTE ONLY): 15 min  Charges:  $Therapeutic Activity: 8-22 mins                     Moishe Spice, PT, DPT Acute Rehabilitation Services  Pager: [phone] Office: [place] 10/20/2021, 5:39 PM

## 2021-10-21 ENCOUNTER — Inpatient Hospital Stay (HOSPITAL_COMMUNITY): Payer: Medicare Other

## 2021-10-21 LAB — BASIC METABOLIC PANEL
Anion gap: 9 (ref 5–15)
BUN: 53 mg/dL — ABNORMAL HIGH (ref 8–23)
CO2: 20 mmol/L — ABNORMAL LOW (ref 22–32)
Calcium: 8 mg/dL — ABNORMAL LOW (ref 8.9–10.3)
Chloride: 98 mmol/L (ref 98–111)
Creatinine, Ser: 2.02 mg/dL — ABNORMAL HIGH (ref 0.44–1.00)
GFR, Estimated: 24 mL/min — ABNORMAL LOW (ref >60)
Glucose, Bld: 193 mg/dL — ABNORMAL HIGH (ref 70–99)
Potassium: 4.8 mmol/L (ref 3.5–5.1)
Sodium: 127 mmol/L — ABNORMAL LOW (ref 135–145)

## 2021-10-21 LAB — CBC WITH DIFFERENTIAL/PLATELET
Abs Immature Granulocytes: 0.34 10*3/uL — ABNORMAL HIGH (ref 0.00–0.07)
Basophils Absolute: 0.1 10*3/uL (ref 0.0–0.1)
Basophils Relative: 0 %
Eosinophils Absolute: 0.1 10*3/uL (ref 0.0–0.5)
Eosinophils Relative: 0 %
HCT: 30.3 % — ABNORMAL LOW (ref 36.0–46.0)
Hemoglobin: 10 g/dL — ABNORMAL LOW (ref 12.0–15.0)
Immature Granulocytes: 1 %
Lymphocytes Relative: 6 %
Lymphs Abs: 1.4 10*3/uL (ref 0.7–4.0)
MCH: 31.7 pg (ref 26.0–34.0)
MCHC: 33 g/dL (ref 30.0–36.0)
MCV: 96.2 fL (ref 80.0–100.0)
Monocytes Absolute: 2.1 10*3/uL — ABNORMAL HIGH (ref 0.1–1.0)
Monocytes Relative: 9 %
Neutro Abs: 20.2 10*3/uL — ABNORMAL HIGH (ref 1.7–7.7)
Neutrophils Relative %: 84 %
Platelets: 235 10*3/uL (ref 150–400)
RBC: 3.15 MIL/uL — ABNORMAL LOW (ref 3.87–5.11)
RDW: 14 % (ref 11.5–15.5)
WBC: 24.1 10*3/uL — ABNORMAL HIGH (ref 4.0–10.5)
nRBC: 0 % (ref 0.0–0.2)

## 2021-10-21 LAB — MAGNESIUM: Magnesium: 1.9 mg/dL (ref 1.7–2.4)

## 2021-10-21 LAB — GLUCOSE, CAPILLARY
Glucose-Capillary: 202 mg/dL — ABNORMAL HIGH (ref 70–99)
Glucose-Capillary: 237 mg/dL — ABNORMAL HIGH (ref 70–99)
Glucose-Capillary: 308 mg/dL — ABNORMAL HIGH (ref 70–99)
Glucose-Capillary: 125 mg/dL — ABNORMAL HIGH (ref 70–99)

## 2021-10-21 MED ORDER — INSULIN GLARGINE-YFGN 100 UNIT/ML ~~LOC~~ SOLN
3.0000 [IU] | Freq: Once | SUBCUTANEOUS | Status: DC
  Filled 2021-10-21: qty 0.03

## 2021-10-21 MED ORDER — SODIUM CHLORIDE 0.9 % IV BOLUS
500.0000 mL | Freq: Once | INTRAVENOUS | Status: AC
  Administered 2021-10-21: 500 mL via INTRAVENOUS

## 2021-10-21 MED ORDER — INSULIN GLARGINE-YFGN 100 UNIT/ML ~~LOC~~ SOLN
16.0000 [IU] | Freq: Every day | SUBCUTANEOUS | Status: DC
Start: 2021-10-22 — End: 2021-10-22
  Administered 2021-10-22: 16 [IU] via SUBCUTANEOUS
  Filled 2021-10-21: qty 0.16

## 2021-10-21 MED ORDER — SODIUM CHLORIDE 0.9 % IV SOLN
2.0000 g | INTRAVENOUS | Status: DC
  Administered 2021-10-22 – 2021-10-23 (×2): 2 g via INTRAVENOUS
  Filled 2021-10-21 (×2): qty 20

## 2021-10-21 MED ORDER — APIXABAN 2.5 MG PO TABS
2.5000 mg | ORAL_TABLET | Freq: Two times a day (BID) | ORAL | Status: DC
  Administered 2021-10-21 – 2021-10-23 (×4): 2.5 mg via ORAL
  Filled 2021-10-21 (×4): qty 1

## 2021-10-21 NOTE — Progress Notes (Signed)
Scr up again to 2 today. She is meeting 2 criteria for dose reduction for apixaban. Ok per Dr Janee Morn.  Ulyses Southward, PharmD, BCIDP, AAHIVP, CPP Infectious Disease Pharmacist 10/21/2021 9:22 AM

## 2021-10-21 NOTE — Progress Notes (Signed)
PROGRESS NOTE    Sierra Wright  TOI:[id number] DOB: February 02, 1934 DOA: 10/11/2021 PCP: [name], MD    Chief Complaint  Patient presents with   Ankle Pain    Mechanical fall witnessed, right ankle deformity.   Diarrhea    X several days   Hip Pain    Left hip pain hurting even prior to fall, pt has had this worked up in the past.    Brief Narrative:  Sierra Wright is an 87/F with history of CAD/CABG, chronic diastolic CHF, type 2 diabetes mellitus, CVA, hypertension, hypothyroidism, osteoarthritis presented to [name], ED on 11/9 from her PCPs office following a mechanical fall with history of pain to both ankles, reports that her legs gave out prior to her fall, denied any loss of consciousness. -In the emergency room vital signs were stable, labs noted creatinine of 2.2 pelvic x-rays were unremarkable, right ankle x-ray with bimalleolar acute displaced fracture, cortical irregularity medial talus -Left ankle x-ray with indeterminate nondisplaced distal fibular fracture with lateral ankle subcutaneous soft tissue edema.  EDP attempted manual reduction right ankle but was unsuccessful.  Orthopedics was consulted.,  Aspirin and Plavix were held she was transferred to [facility] for surgery early next week -11/11 noted to be in A. Fib/atrial flutter   Assessment & Plan:   Principal Problem:   Ankle fracture Active Problems:   Leukocytosis   Essential hypertension   RLS (restless legs syndrome)   AKI (acute kidney injury) (HCC)   Anemia   PVD (peripheral vascular disease) (HCC)   Nausea   Acute lower UTI  Bilateral ankle fractures -Following mechanical fall, unfortunately sustained right ankle bimalleolar displaced fracture and nondisplaced distal fibular fracture on the left  -Orthopedics consulting, aspirin and Plavix were held, initial plan was for ORIF this week -Seen by Dr. Lajoyce Corners today, felt risk of surgery was too high in the setting of peripheral vascular disease and  severe osteoporosis, recommended nonsurgical management, nonweightbearing on both lower extremities for 3 weeks, may apply pressure through both lower extremities for transfers only, recommended follow-up in 2 weeks, continue splint for now -[facility] consult, will need short-term SNF -Started Eliquis for new onset A. Fib.  Leukocytosis -Patient noted with a significant leukocytosis with white count of 21.8 (10/20/2021 )from 10.1(10/17/2021). -Leukocytosis trending up currently at 24.1. -Patient with complaints of dysuria, suprapubic lower abdominal pain, nausea. -Urinalysis ordered and consistent with UTI, urine cultures preliminary results with Klebsiella pneumoniae. -Chest x-ray negative for any acute abnormalities. -Blood cultures pending. -Continue IV Rocephin.  Klebsiella pneumonia UTI -Patient noted to have a significant leukocytosis of 21.8  (10/20/2021) with leukocytosis increasing to 24.1 today.  -Patient symptomatic with dysuria, suprapubic lower abdominal pain, and nausea. -Urinalysis done this morning concerning for UTI. -Urine cultures preliminary with Klebsiella pneumonia, 70,000 colonies, with sensitivities pending.   -Continue IV Rocephin.    Atrial flutter/A. Fib -Detected on telemetry from 11/11 heart rate controlled, on current regimen of Lopressor.   -Back in normal sinus rhythm.  -chads2vasc score >3, started on Eliquis this admission for anticoagulation. -Eliquis dose being adjusted per pharmacy due to renal function.   Acute renal failure Creatinine on admission elevated 2.21, etiology likely secondary to prerenal from poor oral intake/dehydration in the setting of ACE inhibitor and diuretics.  Baseline creatinine 1.2.   -- Renal function fluctuating, initially improving but back up to 2.20.  -Blood pressure borderline.   -BUN elevated.   -Discontinue hydralazine, Norvasc.   -Check urine sodium, urine creatinine,  renal ultrasound.   -IV fluids.   -Repeat labs in the  morning.      Hyperkalemia -Potassium at 4.8.   -Status post Lokelma.   -Continue to hold ACE inhibitor.   -Repeat labs in the a.m.    CAD s/p CABG Chronic diastolic CHF -- Euvolemic.    -Was on aspirin and Plavix prior to admission which was subsequently discontinued and patient currently on Eliquis.   -Continue Metroprolol, Imdur.   -Discontinue hydralazine, Norvasc due to soft blood pressure.  -Benazepril and Lasix resumed at a lower dose on discharge.   -Outpatient follow-up.     Essential hypertension -Blood pressure soft today.   -Continue metoprolol, Imdur.   -Discontinue Norvasc, hydralazine.   -Follow.   Type 2 diabetes mellitus Hemoglobin A1c 6.5, well controlled.  Home regimen includes glimepiride 1 mg p.o. daily, Lantus 26 units subcutaneously daily. -- CBG 202 this morning.   -Increase Semglee to 16 units daily.   -SSI.    Hypothyroidism -- Levothyroxine.  -Outpatient follow-up.     Hx CVA -- Aspirin and Plavix discontinued and patient placed on Eliquis.   -Patient noted to have allergy to statin.    RLS:  -Lyrica, Sinemet.   Constipation -Improved on current bowel regimen.   Nausea -Patient with complaints of nausea 10/19/2021 likely secondary to UTI as patient today with complaints of dysuria and suprapubic abdominal pain. -Patient with clinical improvement on Compazine.  -Placed on empiric IV Rocephin for UTI pending urine cultures.  -IV antibiotics, supportive care.      DVT prophylaxis: Eliquis Code Status: Full Family Communication: Updated patient.  Updated husband and daughter at bedside. Disposition:   Status is: Inpatient  Remains inpatient appropriate because: Severity of illness       Consultants:  Orthopedics: Dr. Sharol Given 10/16/2021 Orthopedics: Dr.Xu  Procedures: CT right ankle 10/12/2021 CT left ankle 10/15/2021 Plain films of bilateral ankles 10/12/2019 2 plain films of the pelvis 10/11/2021 Chest x-ray 10/20/2021 Renal  ultrasound pending  Antimicrobials:  IV Rocephin 10/20/2021>>>>>   Subjective: Patient laying in bed.  States some improvement with suprapubic lower abdominal pain.  Some improvement with nausea.  No chest pain.  No shortness of breath.    Objective: Vitals:   10/20/21 1948 10/21/21 0405 10/21/21 0749 10/21/21 1410  BP: (!) 108/31 (!) 111/49 (!) 119/41 (!) 122/54  Pulse: (!) 58 66 (!) 53 66  Resp: 18 20 17 17   Temp: 98.2 F (36.8 C) 98 F (36.7 C) (!) 97.4 F (36.3 C) 99 F (37.2 C)  TempSrc: Oral     SpO2: 96% 96% 97% 94%  Weight:      Height:       No intake or output data in the 24 hours ending 10/21/21 1644  Filed Weights   10/11/21 1805  Weight: 95.3 kg    Examination:  General exam: NAD. Respiratory system: Clear to auscultation anterior lung fields.  No wheezes, normal respiratory effort.  Cardiovascular system: Regular rate rhythm no murmurs rubs or gallops.  No JVD.  No lower extremity edema Gastrointestinal system: Abdomen is soft, nondistended, less tender to palpation in the suprapubic region.  Positive bowel sounds.  No rebound.  No guarding. Central nervous system: Alert and oriented. No focal neurological deficits. Extremities: Bilateral lower extremities wrapped in splints/bandages.  Skin: No rashes, lesions or ulcers Psychiatry: Judgement and insight appear normal. Mood & affect appropriate.     Data Reviewed: I have personally reviewed following labs and imaging studies  CBC: Recent Labs  Lab 10/15/21 0140 10/17/21 0326 10/20/21 0216 10/21/21 0156  WBC 11.5* 10.1 21.8* 24.1*  NEUTROABS  --   --   --  20.2*  HGB 10.7* 11.1* 11.1* 10.0*  HCT 32.9* 34.9* 34.0* 30.3*  MCV 96.5 98.0 96.0 96.2  PLT 158 207 244 235     Basic Metabolic Panel: Recent Labs  Lab 10/17/21 0326 10/17/21 1552 10/18/21 0200 10/20/21 0216 10/21/21 0156  NA 131* 131* 128* 131* 127*  K 5.3* 4.7 4.3 4.3 4.8  CL 102 101 100 99 98  CO2 20* 24 20* 25 20*  GLUCOSE  183* 193* 195* 128* 193*  BUN 31* 28* 27* 35* 53*  CREATININE 1.27* 1.28* 1.14* 1.45* 2.02*  CALCIUM 8.1* 8.1* 7.9* 8.3* 8.0*  MG  --   --   --  1.8 1.9     GFR: Estimated Creatinine Clearance: 23.7 mL/min (A) (by C-G formula based on SCr of 2.02 mg/dL (H)).  Liver Function Tests: No results for input(s): AST, ALT, ALKPHOS, BILITOT, PROT, ALBUMIN in the last 168 hours.  CBG: Recent Labs  Lab 10/20/21 1625 10/20/21 1944 10/21/21 0750 10/21/21 1125 10/21/21 1521  GLUCAP 281* 246* 202* 237* 308*      Recent Results (from the past 240 hour(s))  Resp Panel by RT-PCR (Flu A&B, Covid) Nasopharyngeal Swab     Status: None   Collection Time: 10/11/21 10:19 PM   Specimen: Nasopharyngeal Swab; Nasopharyngeal(NP) swabs in vial transport medium  Result Value Ref Range Status   SARS Coronavirus 2 by RT PCR NEGATIVE NEGATIVE Final    Comment: (NOTE) SARS-CoV-2 target nucleic acids are NOT DETECTED.  The SARS-CoV-2 RNA is generally detectable in upper respiratory specimens during the acute phase of infection. The lowest concentration of SARS-CoV-2 viral copies this assay can detect is 138 copies/mL. A negative result does not preclude SARS-Cov-2 infection and should not be used as the sole basis for treatment or other patient management decisions. A negative result may occur with  improper specimen collection/handling, submission of specimen other than nasopharyngeal swab, presence of viral mutation(s) within the areas targeted by this assay, and inadequate number of viral copies(<138 copies/mL). A negative result must be combined with clinical observations, patient history, and epidemiological information. The expected result is Negative.  Fact Sheet for Patients:  EntrepreneurPulse.com.au  Fact Sheet for Healthcare Providers:  IncredibleEmployment.be  This test is no t yet approved or cleared by the [place] FDA and  has been  authorized for detection and/or diagnosis of SARS-CoV-2 by FDA under an Emergency Use Authorization (EUA). This EUA will remain  in effect (meaning this test can be used) for the duration of the COVID-19 declaration under Section 564(b)(1) of the Act, 21 U.S.C.section 360bbb-3(b)(1), unless the authorization is terminated  or revoked sooner.       Influenza A by PCR NEGATIVE NEGATIVE Final   Influenza B by PCR NEGATIVE NEGATIVE Final    Comment: (NOTE) The Xpert Xpress SARS-CoV-2/FLU/RSV plus assay is intended as an aid in the diagnosis of influenza from Nasopharyngeal swab specimens and should not be used as a sole basis for treatment. Nasal washings and aspirates are unacceptable for Xpert Xpress SARS-CoV-2/FLU/RSV testing.  Fact Sheet for Patients: EntrepreneurPulse.com.au  Fact Sheet for Healthcare Providers: IncredibleEmployment.be  This test is not yet approved or cleared by the [place] FDA and has been authorized for detection and/or diagnosis of SARS-CoV-2 by FDA under an Emergency Use Authorization (EUA). This EUA will remain in  effect (meaning this test can be used) for the duration of the COVID-19 declaration under Section 564(b)(1) of the Act, 21 U.S.C. section 360bbb-3(b)(1), unless the authorization is terminated or revoked.  Performed at [facility], [name] [address] [place], [place] [address]   SARS CORONAVIRUS 2 (TAT 6-24 HRS) Nasopharyngeal Nasopharyngeal Swab     Status: None   Collection Time: 10/18/21  6:35 PM   Specimen: Nasopharyngeal Swab  Result Value Ref Range Status   SARS Coronavirus 2 NEGATIVE NEGATIVE Final    Comment: (NOTE) SARS-CoV-2 target nucleic acids are NOT DETECTED.  The SARS-CoV-2 RNA is generally detectable in upper and lower respiratory specimens during the acute phase of infection. Negative results do not preclude SARS-CoV-2 infection, do not rule out co-infections with  other pathogens, and should not be used as the sole basis for treatment or other patient management decisions. Negative results must be combined with clinical observations, patient history, and epidemiological information. The expected result is Negative.  Fact Sheet for Patients: SugarRoll.be  Fact Sheet for Healthcare Providers: https://www.woods-mathews.com/  This test is not yet approved or cleared by the [place] FDA and  has been authorized for detection and/or diagnosis of SARS-CoV-2 by FDA under an Emergency Use Authorization (EUA). This EUA will remain  in effect (meaning this test can be used) for the duration of the COVID-19 declaration under Se ction 564(b)(1) of the Act, 21 U.S.C. section 360bbb-3(b)(1), unless the authorization is terminated or revoked sooner.  Performed at [facility], [place] [address], [place], [place] [address]   Urine Culture     Status: Abnormal (Preliminary result)   Collection Time: 10/20/21  8:21 AM   Specimen: Urine, Catheterized  Result Value Ref Range Status   Specimen Description URINE, CATHETERIZED  Final   Special Requests NONE  Final   Culture (A)  Final    70,000 COLONIES/mL KLEBSIELLA PNEUMONIAE SUSCEPTIBILITIES TO FOLLOW Performed at [facility], [place] [address] [place], [place] [address]    Report Status PENDING  Incomplete          Radiology Studies: US RENAL  Result Date: 10/21/2021 CLINICAL DATA:  AK I EXAM: RENAL / URINARY TRACT ULTRASOUND COMPLETE COMPARISON:  CT abdomen pelvis 01/15/2018 FINDINGS: Right Kidney: Renal measurements: 9.9 x 4.5 x 4.3 cm = volume: 100 mL. Echogenicity within normal limits. There is a cyst measuring 1.3 cm. No solid mass. No hydronephrosis. Left Kidney: Renal measurements: 10.2 x 5.3 x 5.6 cm = volume: 160 mL. Echogenicity within normal limits. No mass or hydronephrosis visualized. Bladder: Appears normal for degree of bladder  distention. Other: None. IMPRESSION: No acute finding in the bilateral kidneys. Electronically Signed   By: Audie Pinto M.D.   On: 10/21/2021 15:11   DG CHEST PORT 1 VIEW  Result Date: 10/20/2021 CLINICAL DATA:  Leukocytosis, constipation EXAM: PORTABLE CHEST 1 VIEW COMPARISON:  Chest radiograph 01/20/2020 FINDINGS: Median sternotomy wires and mediastinal surgical clips are again seen. The heart is at the upper limits of normal for size. There is calcified atherosclerotic plaque of the aortic arch. The mediastinal contours are normal. Linear opacities in the left mid lung and base likely reflect atelectasis or scar. There is no focal consolidation. There is no pulmonary edema. There is no pleural effusion or pneumothorax. There is degenerative change of the right shoulder. There is no acute osseous abnormality. IMPRESSION: 1. Left basilar atelectasis or scar. No radiographic evidence of acute cardiopulmonary process. 2. Unchanged borderline cardiomegaly. Electronically  Signed   By: Valetta Mole M.D.   On: 10/20/2021 10:04   DG Abd Portable 1V  Result Date: 10/20/2021 CLINICAL DATA:  Leukocytosis, constipation EXAM: PORTABLE ABDOMEN - 1 VIEW COMPARISON:  None. FINDINGS: There is a nonobstructive bowel gas pattern. There is no abnormally large colonic stool burden. There is no definite free intraperitoneal air, suboptimally evaluated given supine technique. Right upper quadrant and midline upper abdominal surgical clips are noted. There is no gross organomegaly or abnormal soft tissue calcification. There is multilevel degenerative change of the lumbar spine and marked degenerative change of the left hip. IMPRESSION: Nonobstructive bowel gas pattern. No abnormally large colonic stool burden. Electronically Signed   By: Valetta Mole M.D.   On: 10/20/2021 10:05        Scheduled Meds:  apixaban  2.5 mg Oral BID   bisacodyl  10 mg Rectal Once   carbidopa-levodopa  1.5 tablet Oral QHS   insulin  aspart  0-15 Units Subcutaneous TID WC   insulin aspart  0-5 Units Subcutaneous QHS   insulin glargine-yfgn  13 Units Subcutaneous Daily   isosorbide mononitrate  60 mg Oral Daily   levothyroxine  100 mcg Oral QAC breakfast   metoprolol succinate  25 mg Oral Daily   nystatin   Topical TID   polyethylene glycol  17 g Oral BID   pregabalin  50 mg Oral BID   senna-docusate  2 tablet Oral BID   Continuous Infusions:  sodium chloride 100 mL/hr at 10/21/21 0916   cefTRIAXone (ROCEPHIN)  IV 1 g (10/21/21 1151)     LOS: 9 days    Time spent: 40 minutes    Irine Seal, MD [facility]   To contact the attending provider between 7A-7P or the covering provider during after hours 7P-7A, please log into the web site [email or web address] and access using universal [place] password for that web site. If you do not have the password, please call the hospital operator.  10/21/2021, 4:44 PM

## 2021-10-21 NOTE — Progress Notes (Signed)
Attempted a straight cath and no urine output

## 2021-10-22 ENCOUNTER — Other Ambulatory Visit: Payer: Medicare Other

## 2021-10-22 LAB — BASIC METABOLIC PANEL
Anion gap: 9 (ref 5–15)
BUN: 63 mg/dL — ABNORMAL HIGH (ref 8–23)
CO2: 20 mmol/L — ABNORMAL LOW (ref 22–32)
Calcium: 7.7 mg/dL — ABNORMAL LOW (ref 8.9–10.3)
Chloride: 99 mmol/L (ref 98–111)
Creatinine, Ser: 2.04 mg/dL — ABNORMAL HIGH (ref 0.44–1.00)
GFR, Estimated: 23 mL/min — ABNORMAL LOW (ref >60)
Glucose, Bld: 173 mg/dL — ABNORMAL HIGH (ref 70–99)
Potassium: 4.8 mmol/L (ref 3.5–5.1)
Sodium: 128 mmol/L — ABNORMAL LOW (ref 135–145)

## 2021-10-22 LAB — CBC WITH DIFFERENTIAL/PLATELET
Abs Immature Granulocytes: 0.21 10*3/uL — ABNORMAL HIGH (ref 0.00–0.07)
Basophils Absolute: 0.1 10*3/uL (ref 0.0–0.1)
Basophils Relative: 0 %
Eosinophils Absolute: 0.3 10*3/uL (ref 0.0–0.5)
Eosinophils Relative: 2 %
HCT: 30.5 % — ABNORMAL LOW (ref 36.0–46.0)
Hemoglobin: 10 g/dL — ABNORMAL LOW (ref 12.0–15.0)
Immature Granulocytes: 1 %
Lymphocytes Relative: 10 %
Lymphs Abs: 1.7 10*3/uL (ref 0.7–4.0)
MCH: 31.4 pg (ref 26.0–34.0)
MCHC: 32.8 g/dL (ref 30.0–36.0)
MCV: 95.9 fL (ref 80.0–100.0)
Monocytes Absolute: 1.2 10*3/uL — ABNORMAL HIGH (ref 0.1–1.0)
Monocytes Relative: 7 %
Neutro Abs: 13.1 10*3/uL — ABNORMAL HIGH (ref 1.7–7.7)
Neutrophils Relative %: 80 %
Platelets: 232 10*3/uL (ref 150–400)
RBC: 3.18 MIL/uL — ABNORMAL LOW (ref 3.87–5.11)
RDW: 13.8 % (ref 11.5–15.5)
WBC: 16.7 10*3/uL — ABNORMAL HIGH (ref 4.0–10.5)
nRBC: 0 % (ref 0.0–0.2)

## 2021-10-22 LAB — URINE CULTURE: Culture: 70000 — AB

## 2021-10-22 LAB — GLUCOSE, CAPILLARY
Glucose-Capillary: 232 mg/dL — ABNORMAL HIGH (ref 70–99)
Glucose-Capillary: 214 mg/dL — ABNORMAL HIGH (ref 70–99)
Glucose-Capillary: 223 mg/dL — ABNORMAL HIGH (ref 70–99)
Glucose-Capillary: 147 mg/dL — ABNORMAL HIGH (ref 70–99)

## 2021-10-22 MED ORDER — INSULIN GLARGINE-YFGN 100 UNIT/ML ~~LOC~~ SOLN
20.0000 [IU] | Freq: Every day | SUBCUTANEOUS | Status: DC
  Administered 2021-10-23: 20 [IU] via SUBCUTANEOUS
  Filled 2021-10-22: qty 0.2

## 2021-10-22 MED ORDER — SODIUM CHLORIDE 0.9 % IV SOLN: INTRAVENOUS | Status: DC

## 2021-10-22 MED ORDER — INSULIN GLARGINE-YFGN 100 UNIT/ML ~~LOC~~ SOLN
4.0000 [IU] | Freq: Once | SUBCUTANEOUS | Status: AC
Start: 2021-10-22 — End: 2021-10-22
  Administered 2021-10-22: 4 [IU] via SUBCUTANEOUS
  Filled 2021-10-22: qty 0.04

## 2021-10-22 NOTE — Progress Notes (Addendum)
PROGRESS NOTE    Sierra Wright  [id number] DOB: [birth year] DOA: 10/11/2021 PCP: [name], MD    Chief Complaint  Patient presents with   Ankle Pain    Mechanical fall witnessed, right ankle deformity.   Diarrhea    X several days   Hip Pain    Left hip pain hurting even prior to fall, pt has had this worked up in the past.    Brief Narrative:  [name] is an 87/F with history of CAD/CABG, chronic diastolic CHF, type 2 diabetes mellitus, CVA, hypertension, hypothyroidism, osteoarthritis presented to [facility] ED on 11/9 from her PCPs office following a mechanical fall with history of pain to both ankles, reports that her legs gave out prior to her fall, denied any loss of consciousness. -In the emergency room vital signs were stable, labs noted creatinine of 2.2 pelvic x-rays were unremarkable, right ankle x-ray with bimalleolar acute displaced fracture, cortical irregularity medial talus -Left ankle x-ray with indeterminate nondisplaced distal fibular fracture with lateral ankle subcutaneous soft tissue edema.  EDP attempted manual reduction right ankle but was unsuccessful.  Orthopedics was consulted.,  Aspirin and Plavix were held she was transferred to [facility] for surgery early next week -11/11 noted to be in A. Fib/atrial flutter   Assessment & Plan:   Principal Problem:   Ankle fracture Active Problems:   Leukocytosis   Essential hypertension   RLS (restless legs syndrome)   AKI (acute kidney injury) (HCC)   Anemia   PVD (peripheral vascular disease) (HCC)   Nausea   Acute lower UTI  Bilateral ankle fractures -Following mechanical fall, unfortunately sustained right ankle bimalleolar displaced fracture and nondisplaced distal fibular fracture on the left  -Orthopedics consulting, aspirin and Plavix were held, initial plan was for ORIF this week -Seen by Dr. Sharol Given today, felt risk of surgery was too high in the setting of peripheral vascular disease and  severe osteoporosis, recommended nonsurgical management, nonweightbearing on both lower extremities for 3 weeks, may apply pressure through both lower extremities for transfers only, recommended follow-up in 2 weeks, continue splint for now -[facility] consult, will need short-term SNF -Started Eliquis for new onset A. Fib.  Leukocytosis -Patient noted with a significant leukocytosis with white count of 21.8 (10/20/2021 )from 10.1(10/17/2021). -Leukocytosis was trending up as high as 24.1 however trending back down currently at 16.7. -Patient with complaints of dysuria, suprapubic lower abdominal pain, nausea which is improving with initiation of IV antibiotics. -Urinalysis ordered and consistent with UTI, urine cultures results with Klebsiella pneumoniae. -Chest x-ray negative for any acute abnormalities. -Blood cultures pending with no growth to date. -Continue IV Rocephin and if continued improvement with leukocytosis could likely transition to oral antibiotics in the next 24 hours to complete course of antibiotic treatment.  Klebsiella pneumonia UTI -Patient noted to have a significant leukocytosis of 21.8  (10/20/2021) with leukocytosis increasing as high as 24.1 and trending back down currently at 16.7.  -Patient noted to be symptomatic with dysuria, suprapubic lower abdominal pain, and nausea. -Urinalysis concerning for UTI, with urine culture with 70,000 colonies of Klebsiella pneumoniae.   -Continue IV Rocephin and if continued improvement with leukocytosis in the next 24 hours could likely transition to oral antibiotics to complete course of antibiotic treatment.    Atrial flutter/A. Fib -Detected on telemetry from 11/11 heart rate controlled, on current regimen of Lopressor.   -Back in normal sinus rhythm.  -chads2vasc score >3, started on Eliquis this admission for anticoagulation. -Eliquis  dose being adjusted per pharmacy due to renal function.   Acute renal failure Creatinine on  admission elevated 2.21, etiology likely secondary to prerenal from poor oral intake/dehydration in the setting of ACE inhibitor and diuretics.  Baseline creatinine 1.2.   -- Renal function fluctuating, initially improving but back up to 2.02 -Blood pressure borderline.   -BUN elevated.   -Renal ultrasound with no acute abnormalities, negative for hydronephrosis.   -Norvasc, hydralazine discontinued.   -Urine electrolytes of sodium and creatinine ordered and still pending  -IV fluids.     Hyperkalemia -Potassium stable at 4.8.   -Status post Lokelma.   -ACE inhibitor on hold.   -Repeat labs in the morning.    CAD s/p CABG Chronic diastolic CHF -- Euvolemic.    -Was on aspirin and Plavix prior to admission which was subsequently discontinued and patient currently on Eliquis.   -Continue Imdur, metoprolol.   -Continue to hold hydralazine, Norvasc due to soft blood pressure.  -Continue to hold benazepril and Lasix which may be resumed at a lower dose on discharge.   -Outpatient follow-up.     Essential hypertension -BP noted to be soft.   -Continue to hold hydralazine, Norvasc.   -Continue metoprolol, Imdur.   -Follow.     Type 2 diabetes mellitus Hemoglobin A1c 6.5, well controlled.  Home regimen includes glimepiride 1 mg p.o. daily, Lantus 26 units subcutaneously daily. -- CBG 232 this morning.   -CBG 202 this morning.   -Increase Semglee to 20 units daily.   -SSI.    Hypothyroidism -- Continue levothyroxine.  -Outpatient follow-up with repeat TFTs in 4 to 6 weeks.   Hx CVA -- Aspirin and Plavix discontinued and patient currently on Eliquis.   -Unable to place on statin as patient noted to have an allergy to statins.   RLS:  -Continue Sinemet, Lyrica.   Constipation -Improved on current bowel regimen.   Nausea -Patient with complaints of nausea 10/19/2021 likely secondary to UTI as patient did have complaints of dysuria, suprapubic abdominal pain.   -Clinical  improvement on IV antibiotics and IV antiemetics.   -Supportive care.    Hyponatremia -Likely secondary to hypovolemic hyponatremia. -Urine studies ordered and pending. -IV fluids. -Repeat labs in the morning.  DVT prophylaxis: Eliquis Code Status: Full Family Communication: Updated patient.  Updated husband and daughter at bedside. Disposition:   Status is: Inpatient  Remains inpatient appropriate because: Severity of illness       Consultants:  Orthopedics: Dr. Sharol Given 10/16/2021 Orthopedics: Dr.Xu  Procedures: CT right ankle 10/12/2021 CT left ankle 10/15/2021 Plain films of bilateral ankles 10/12/2019 2 plain films of the pelvis 10/11/2021 Chest x-ray 10/20/2021 Renal ultrasound 10/21/2021  Antimicrobials:  IV Rocephin 10/20/2021>>>>>   Subjective: Patient laying in bed.  Overall slowly feeling better.  Nausea improving.  Suprapubic abdominal pain improving.  Still with poor appetite per patient.  No chest pain.  No shortness of breath.   Objective: Vitals:   10/21/21 1948 10/22/21 0507 10/22/21 0800 10/22/21 0835  BP: (!) 117/39 (!) 121/51  122/62  Pulse: 61 67  78  Resp: 18 17  18   Temp: (!) 97.5 F (36.4 C) (!) 97.4 F (36.3 C)  97.9 F (36.6 C)  TempSrc:    Oral  SpO2: 98% 98% 98% 98%  Weight:   106.5 kg   Height:        Intake/Output Summary (Last 24 hours) at 10/22/2021 [id number] Last data filed at 10/22/2021 0846 Gross per 24 hour  Intake 455 ml  Output --  Net 455 ml    Filed Weights   10/11/21 1805 10/22/21 0800  Weight: 95.3 kg 106.5 kg    Examination:  General exam: NAD Respiratory system: Lungs clear to auscultation bilaterally.  No wheezes, no crackles, no rhonchi.  Normal respiratory effort.  Cardiovascular system: RRR no murmurs rubs or gallops.  No JVD.  No lower extremity edema.  Gastrointestinal system: Abdomen is soft, nondistended, less tender to palpation in the suprapubic region, positive bowel sounds.  No guarding.  No  rebound. Central nervous system: Alert and oriented. No focal neurological deficits. Extremities: Bilateral lower extremities wrapped in splints/bandages.  Skin: No rashes, lesions or ulcers Psychiatry: Judgement and insight appear normal. Mood & affect appropriate.     Data Reviewed: I have personally reviewed following labs and imaging studies  CBC: Recent Labs  Lab 10/17/21 0326 10/20/21 0216 10/21/21 0156 10/22/21 0309  WBC 10.1 21.8* 24.1* 16.7*  NEUTROABS  --   --  20.2* 13.1*  HGB 11.1* 11.1* 10.0* 10.0*  HCT 34.9* 34.0* 30.3* 30.5*  MCV 98.0 96.0 96.2 95.9  PLT 207 244 235 232     Basic Metabolic Panel: Recent Labs  Lab 10/17/21 1552 10/18/21 0200 10/20/21 0216 10/21/21 0156 10/22/21 0309  NA 131* 128* 131* 127* 128*  K 4.7 4.3 4.3 4.8 4.8  CL 101 100 99 98 99  CO2 24 20* 25 20* 20*  GLUCOSE 193* 195* 128* 193* 173*  BUN 28* 27* 35* 53* 63*  CREATININE 1.28* 1.14* 1.45* 2.02* 2.04*  CALCIUM 8.1* 7.9* 8.3* 8.0* 7.7*  MG  --   --  1.8 1.9  --      GFR: Estimated Creatinine Clearance: 24.9 mL/min (A) (by C-G formula based on SCr of 2.04 mg/dL (H)).  Liver Function Tests: No results for input(s): AST, ALT, ALKPHOS, BILITOT, PROT, ALBUMIN in the last 168 hours.  CBG: Recent Labs  Lab 10/21/21 0750 10/21/21 1125 10/21/21 1521 10/21/21 2039 10/22/21 0831  GLUCAP 202* 237* 308* 125* 232*      Recent Results (from the past 240 hour(s))  SARS CORONAVIRUS 2 (TAT 6-24 HRS) Nasopharyngeal Nasopharyngeal Swab     Status: None   Collection Time: 10/18/21  6:35 PM   Specimen: Nasopharyngeal Swab  Result Value Ref Range Status   SARS Coronavirus 2 NEGATIVE NEGATIVE Final    Comment: (NOTE) SARS-CoV-2 target nucleic acids are NOT DETECTED.  The SARS-CoV-2 RNA is generally detectable in upper and lower respiratory specimens during the acute phase of infection. Negative results do not preclude SARS-CoV-2 infection, do not rule out co-infections with  other pathogens, and should not be used as the sole basis for treatment or other patient management decisions. Negative results must be combined with clinical observations, patient history, and epidemiological information. The expected result is Negative.  Fact Sheet for Patients: HairSlick.no  Fact Sheet for Healthcare Providers: quierodirigir.com  This test is not yet approved or cleared by the [place] FDA and  has been authorized for detection and/or diagnosis of SARS-CoV-2 by FDA under an Emergency Use Authorization (EUA). This EUA will remain  in effect (meaning this test can be used) for the duration of the COVID-19 declaration under Se ction 564(b)(1) of the Act, 21 U.S.C. section 360bbb-3(b)(1), unless the authorization is terminated or revoked sooner.  Performed at [facility], [address], [place], [place] [address]   Urine Culture     Status: Abnormal (Preliminary result)  Collection Time: 10/20/21  8:21 AM   Specimen: Urine, Catheterized  Result Value Ref Range Status   Specimen Description URINE, CATHETERIZED  Final   Special Requests NONE  Final   Culture (A)  Final    70,000 COLONIES/mL KLEBSIELLA PNEUMONIAE SUSCEPTIBILITIES TO FOLLOW Performed at [facility], [address], [place], [place] [address]    Report Status PENDING  Incomplete  Culture, blood (Routine X 2) w Reflex to ID Panel     Status: None (Preliminary result)   Collection Time: 10/20/21  9:46 AM   Specimen: BLOOD  Result Value Ref Range Status   Specimen Description BLOOD RIGHT ANTECUBITAL  Final   Special Requests   Final    BOTTLES DRAWN AEROBIC ONLY Blood Culture results may not be optimal due to an inadequate volume of blood received in culture bottles   Culture   Final    NO GROWTH 1 DAY Performed at [facility], [address], [place], [place] [address]    Report Status PENDING  Incomplete  Culture,  blood (Routine X 2) w Reflex to ID Panel     Status: None (Preliminary result)   Collection Time: 10/20/21  9:58 AM   Specimen: BLOOD LEFT HAND  Result Value Ref Range Status   Specimen Description BLOOD LEFT HAND  Final   Special Requests   Final    BOTTLES DRAWN AEROBIC ONLY Blood Culture results may not be optimal due to an inadequate volume of blood received in culture bottles   Culture   Final    NO GROWTH 1 DAY Performed at [facility], [address], [place], [place] [address]    Report Status PENDING  Incomplete          Radiology Studies: US RENAL  Result Date: 10/21/2021 CLINICAL DATA:  AK I EXAM: RENAL / URINARY TRACT ULTRASOUND COMPLETE COMPARISON:  CT abdomen pelvis 01/15/2018 FINDINGS: Right Kidney: Renal measurements: 9.9 x 4.5 x 4.3 cm = volume: 100 mL. Echogenicity within normal limits. There is a cyst measuring 1.3 cm. No solid mass. No hydronephrosis. Left Kidney: Renal measurements: 10.2 x 5.3 x 5.6 cm = volume: 160 mL. Echogenicity within normal limits. No mass or hydronephrosis visualized. Bladder: Appears normal for degree of bladder distention. Other: None. IMPRESSION: No acute finding in the bilateral kidneys. Electronically Signed   By: Emmaline Kluver M.D.   On: 10/21/2021 15:11        Scheduled Meds:  apixaban  2.5 mg Oral BID   bisacodyl  10 mg Rectal Once   carbidopa-levodopa  1.5 tablet Oral QHS   insulin aspart  0-15 Units Subcutaneous TID WC   insulin aspart  0-5 Units Subcutaneous QHS   insulin glargine-yfgn  16 Units Subcutaneous Daily   insulin glargine-yfgn  3 Units Subcutaneous Once   isosorbide mononitrate  60 mg Oral Daily   levothyroxine  100 mcg Oral QAC breakfast   metoprolol succinate  25 mg Oral Daily   nystatin   Topical TID   polyethylene glycol  17 g Oral BID   pregabalin  50 mg Oral BID   senna-docusate  2 tablet Oral BID   Continuous Infusions:  cefTRIAXone (ROCEPHIN)  IV       LOS: 10 days    Time spent:  35 minutes    Ramiro Harvest, MD [facility]   To contact the attending provider between 7A-7P or the covering provider during after hours 7P-7A, please log into the  web site [email or web address] and access using universal [place] password for that web site. If you do not have the password, please call the hospital operator.  10/22/2021, 9:21 AM

## 2021-10-22 NOTE — Progress Notes (Signed)
RN asked patient to rate pain at this time, pt c/o 5/10 pain in feet bilaterally.  RN offered pain medication & patient refusing pain meds.  RN educated and repositioned pt legs.  Pt resting comfortably.

## 2021-10-22 NOTE — TOC Progression Note (Signed)
Transition of Care [facility]) - Progression Note    Patient Details  Name: Sierra Wright MRN: [id number] Date of Birth: 04/30/1934  [facility]) CM/SW Contact  Delilah Shan, LCSWA Phone Number: 10/22/2021, 1:06 PM  Clinical Narrative:     Patients insurance authorization has been approved. Plan Auth ID# [id number] [facility] ID# [id number]. Next review date is 11/21. Patient has SNF bed at [facility] when medically ready for dc. CSW will continue to follow and assist with patients dc planning needs.       Expected Discharge Plan and Services                                                 Social Determinants of Health (SDOH) Interventions    Readmission Risk Interventions No flowsheet data found.

## 2021-10-22 NOTE — Progress Notes (Signed)
Report received from previous shift and care assumed.  Morning shift assessment and vital signs documented in flowsheets. Pt alert and oriented x4 and on 3L [place].  RN weaned to 2L [place] 98%. Pt resting comfortably & not complaining of pain at this time but also states "trying not to move".  Ate 90% breakfast and urinated, Puriwic not working correctly & bath given.

## 2021-10-23 DIAGNOSIS — E1151 Type 2 diabetes mellitus with diabetic peripheral angiopathy without gangrene: Secondary | ICD-10-CM | POA: Diagnosis not present

## 2021-10-23 DIAGNOSIS — M25571 Pain in right ankle and joints of right foot: Secondary | ICD-10-CM | POA: Diagnosis not present

## 2021-10-23 DIAGNOSIS — Z741 Need for assistance with personal care: Secondary | ICD-10-CM | POA: Diagnosis not present

## 2021-10-23 DIAGNOSIS — S82841D Displaced bimalleolar fracture of right lower leg, subsequent encounter for closed fracture with routine healing: Secondary | ICD-10-CM | POA: Diagnosis not present

## 2021-10-23 DIAGNOSIS — D649 Anemia, unspecified: Secondary | ICD-10-CM | POA: Diagnosis not present

## 2021-10-23 DIAGNOSIS — R112 Nausea with vomiting, unspecified: Secondary | ICD-10-CM | POA: Diagnosis not present

## 2021-10-23 DIAGNOSIS — Z8673 Personal history of transient ischemic attack (TIA), and cerebral infarction without residual deficits: Secondary | ICD-10-CM | POA: Diagnosis not present

## 2021-10-23 DIAGNOSIS — S82831D Other fracture of upper and lower end of right fibula, subsequent encounter for closed fracture with routine healing: Secondary | ICD-10-CM | POA: Diagnosis not present

## 2021-10-23 DIAGNOSIS — R079 Chest pain, unspecified: Secondary | ICD-10-CM | POA: Diagnosis not present

## 2021-10-23 DIAGNOSIS — S82832S Other fracture of upper and lower end of left fibula, sequela: Secondary | ICD-10-CM | POA: Diagnosis not present

## 2021-10-23 DIAGNOSIS — G2581 Restless legs syndrome: Secondary | ICD-10-CM | POA: Diagnosis not present

## 2021-10-23 DIAGNOSIS — Q7291 Unspecified reduction defect of right lower limb: Secondary | ICD-10-CM | POA: Diagnosis not present

## 2021-10-23 DIAGNOSIS — R110 Nausea: Secondary | ICD-10-CM | POA: Diagnosis not present

## 2021-10-23 DIAGNOSIS — E1165 Type 2 diabetes mellitus with hyperglycemia: Secondary | ICD-10-CM | POA: Diagnosis not present

## 2021-10-23 DIAGNOSIS — Z9181 History of falling: Secondary | ICD-10-CM | POA: Diagnosis not present

## 2021-10-23 DIAGNOSIS — J189 Pneumonia, unspecified organism: Secondary | ICD-10-CM | POA: Diagnosis not present

## 2021-10-23 DIAGNOSIS — Z79899 Other long term (current) drug therapy: Secondary | ICD-10-CM | POA: Diagnosis not present

## 2021-10-23 DIAGNOSIS — R6889 Other general symptoms and signs: Secondary | ICD-10-CM | POA: Diagnosis not present

## 2021-10-23 DIAGNOSIS — I739 Peripheral vascular disease, unspecified: Secondary | ICD-10-CM | POA: Diagnosis not present

## 2021-10-23 DIAGNOSIS — I5032 Chronic diastolic (congestive) heart failure: Secondary | ICD-10-CM | POA: Diagnosis not present

## 2021-10-23 DIAGNOSIS — E1140 Type 2 diabetes mellitus with diabetic neuropathy, unspecified: Secondary | ICD-10-CM | POA: Diagnosis not present

## 2021-10-23 DIAGNOSIS — M25572 Pain in left ankle and joints of left foot: Secondary | ICD-10-CM | POA: Diagnosis not present

## 2021-10-23 DIAGNOSIS — I499 Cardiac arrhythmia, unspecified: Secondary | ICD-10-CM | POA: Diagnosis not present

## 2021-10-23 DIAGNOSIS — S82841S Displaced bimalleolar fracture of right lower leg, sequela: Secondary | ICD-10-CM | POA: Diagnosis not present

## 2021-10-23 DIAGNOSIS — I480 Paroxysmal atrial fibrillation: Secondary | ICD-10-CM | POA: Diagnosis not present

## 2021-10-23 DIAGNOSIS — R051 Acute cough: Secondary | ICD-10-CM | POA: Diagnosis not present

## 2021-10-23 DIAGNOSIS — S82892D Other fracture of left lower leg, subsequent encounter for closed fracture with routine healing: Secondary | ICD-10-CM | POA: Diagnosis not present

## 2021-10-23 DIAGNOSIS — R2681 Unsteadiness on feet: Secondary | ICD-10-CM | POA: Diagnosis not present

## 2021-10-23 DIAGNOSIS — E039 Hypothyroidism, unspecified: Secondary | ICD-10-CM | POA: Diagnosis not present

## 2021-10-23 DIAGNOSIS — N390 Urinary tract infection, site not specified: Secondary | ICD-10-CM | POA: Diagnosis not present

## 2021-10-23 DIAGNOSIS — Z951 Presence of aortocoronary bypass graft: Secondary | ICD-10-CM | POA: Diagnosis not present

## 2021-10-23 DIAGNOSIS — S82899D Other fracture of unspecified lower leg, subsequent encounter for closed fracture with routine healing: Secondary | ICD-10-CM | POA: Diagnosis not present

## 2021-10-23 DIAGNOSIS — R296 Repeated falls: Secondary | ICD-10-CM | POA: Diagnosis not present

## 2021-10-23 DIAGNOSIS — S82891A Other fracture of right lower leg, initial encounter for closed fracture: Secondary | ICD-10-CM | POA: Diagnosis not present

## 2021-10-23 DIAGNOSIS — R197 Diarrhea, unspecified: Secondary | ICD-10-CM | POA: Diagnosis not present

## 2021-10-23 DIAGNOSIS — S99911D Unspecified injury of right ankle, subsequent encounter: Secondary | ICD-10-CM | POA: Diagnosis present

## 2021-10-23 DIAGNOSIS — R0902 Hypoxemia: Secondary | ICD-10-CM | POA: Diagnosis not present

## 2021-10-23 DIAGNOSIS — M6281 Muscle weakness (generalized): Secondary | ICD-10-CM | POA: Diagnosis not present

## 2021-10-23 DIAGNOSIS — I517 Cardiomegaly: Secondary | ICD-10-CM | POA: Diagnosis not present

## 2021-10-23 DIAGNOSIS — R0602 Shortness of breath: Secondary | ICD-10-CM | POA: Diagnosis not present

## 2021-10-23 DIAGNOSIS — D72829 Elevated white blood cell count, unspecified: Secondary | ICD-10-CM | POA: Diagnosis not present

## 2021-10-23 DIAGNOSIS — R059 Cough, unspecified: Secondary | ICD-10-CM | POA: Diagnosis not present

## 2021-10-23 DIAGNOSIS — S82832D Other fracture of upper and lower end of left fibula, subsequent encounter for closed fracture with routine healing: Secondary | ICD-10-CM | POA: Diagnosis not present

## 2021-10-23 DIAGNOSIS — I10 Essential (primary) hypertension: Secondary | ICD-10-CM | POA: Diagnosis not present

## 2021-10-23 DIAGNOSIS — E43 Unspecified severe protein-calorie malnutrition: Secondary | ICD-10-CM | POA: Diagnosis not present

## 2021-10-23 DIAGNOSIS — Z7901 Long term (current) use of anticoagulants: Secondary | ICD-10-CM | POA: Diagnosis not present

## 2021-10-23 DIAGNOSIS — R0789 Other chest pain: Secondary | ICD-10-CM | POA: Diagnosis not present

## 2021-10-23 DIAGNOSIS — I2510 Atherosclerotic heart disease of native coronary artery without angina pectoris: Secondary | ICD-10-CM | POA: Diagnosis not present

## 2021-10-23 DIAGNOSIS — S82842D Displaced bimalleolar fracture of left lower leg, subsequent encounter for closed fracture with routine healing: Secondary | ICD-10-CM | POA: Diagnosis not present

## 2021-10-23 DIAGNOSIS — E1142 Type 2 diabetes mellitus with diabetic polyneuropathy: Secondary | ICD-10-CM | POA: Diagnosis not present

## 2021-10-23 DIAGNOSIS — E11319 Type 2 diabetes mellitus with unspecified diabetic retinopathy without macular edema: Secondary | ICD-10-CM | POA: Diagnosis not present

## 2021-10-23 DIAGNOSIS — S8251XD Displaced fracture of medial malleolus of right tibia, subsequent encounter for closed fracture with routine healing: Secondary | ICD-10-CM | POA: Diagnosis not present

## 2021-10-23 DIAGNOSIS — S82851A Displaced trimalleolar fracture of right lower leg, initial encounter for closed fracture: Secondary | ICD-10-CM | POA: Diagnosis not present

## 2021-10-23 DIAGNOSIS — Z743 Need for continuous supervision: Secondary | ICD-10-CM | POA: Diagnosis not present

## 2021-10-23 DIAGNOSIS — I639 Cerebral infarction, unspecified: Secondary | ICD-10-CM | POA: Diagnosis not present

## 2021-10-23 DIAGNOSIS — I110 Hypertensive heart disease with heart failure: Secondary | ICD-10-CM | POA: Diagnosis not present

## 2021-10-23 DIAGNOSIS — N179 Acute kidney failure, unspecified: Secondary | ICD-10-CM | POA: Diagnosis not present

## 2021-10-23 DIAGNOSIS — R001 Bradycardia, unspecified: Secondary | ICD-10-CM | POA: Diagnosis not present

## 2021-10-23 DIAGNOSIS — Z794 Long term (current) use of insulin: Secondary | ICD-10-CM | POA: Diagnosis not present

## 2021-10-23 DIAGNOSIS — W1839XD Other fall on same level, subsequent encounter: Secondary | ICD-10-CM | POA: Diagnosis not present

## 2021-10-23 LAB — CBC WITH DIFFERENTIAL/PLATELET
Abs Immature Granulocytes: 0.15 10*3/uL — ABNORMAL HIGH (ref 0.00–0.07)
Basophils Absolute: 0.1 10*3/uL (ref 0.0–0.1)
Basophils Relative: 1 %
Eosinophils Absolute: 0.3 10*3/uL (ref 0.0–0.5)
Eosinophils Relative: 3 %
HCT: 32.3 % — ABNORMAL LOW (ref 36.0–46.0)
Hemoglobin: 10.7 g/dL — ABNORMAL LOW (ref 12.0–15.0)
Immature Granulocytes: 1 %
Lymphocytes Relative: 15 %
Lymphs Abs: 1.7 10*3/uL (ref 0.7–4.0)
MCH: 31.6 pg (ref 26.0–34.0)
MCHC: 33.1 g/dL (ref 30.0–36.0)
MCV: 95.3 fL (ref 80.0–100.0)
Monocytes Absolute: 1 10*3/uL (ref 0.1–1.0)
Monocytes Relative: 9 %
Neutro Abs: 8.2 10*3/uL — ABNORMAL HIGH (ref 1.7–7.7)
Neutrophils Relative %: 71 %
Platelets: 263 10*3/uL (ref 150–400)
RBC: 3.39 MIL/uL — ABNORMAL LOW (ref 3.87–5.11)
RDW: 13.8 % (ref 11.5–15.5)
WBC: 11.4 10*3/uL — ABNORMAL HIGH (ref 4.0–10.5)
nRBC: 0 % (ref 0.0–0.2)

## 2021-10-23 LAB — BASIC METABOLIC PANEL
Anion gap: 6 (ref 5–15)
BUN: 50 mg/dL — ABNORMAL HIGH (ref 8–23)
CO2: 23 mmol/L (ref 22–32)
Calcium: 7.9 mg/dL — ABNORMAL LOW (ref 8.9–10.3)
Chloride: 103 mmol/L (ref 98–111)
Creatinine, Ser: 1.51 mg/dL — ABNORMAL HIGH (ref 0.44–1.00)
GFR, Estimated: 34 mL/min — ABNORMAL LOW (ref >60)
Glucose, Bld: 175 mg/dL — ABNORMAL HIGH (ref 70–99)
Potassium: 4.9 mmol/L (ref 3.5–5.1)
Sodium: 132 mmol/L — ABNORMAL LOW (ref 135–145)

## 2021-10-23 LAB — RESP PANEL BY RT-PCR (FLU A&B, COVID) ARPGX2
Influenza A by PCR: NEGATIVE
Influenza B by PCR: NEGATIVE
SARS Coronavirus 2 by RT PCR: NEGATIVE

## 2021-10-23 LAB — GLUCOSE, CAPILLARY
Glucose-Capillary: 178 mg/dL — ABNORMAL HIGH (ref 70–99)
Glucose-Capillary: 170 mg/dL — ABNORMAL HIGH (ref 70–99)
Glucose-Capillary: 203 mg/dL — ABNORMAL HIGH (ref 70–99)

## 2021-10-23 MED ORDER — CEPHALEXIN 500 MG PO CAPS: 500.0000 mg | ORAL_CAPSULE | Freq: Two times a day (BID) | ORAL | 0 refills | Status: AC

## 2021-10-23 NOTE — Progress Notes (Signed)
Pt was resting @ 0030 when pt woke she was confused about where she was and called the police for help. Nurse reoriented pt to time/situation and place. Pt was calm and resting call bell in reach and bed at lowest position.

## 2021-10-23 NOTE — TOC Transition Note (Signed)
Transition of Care [facility]) - CM/SW Discharge Note   Patient Details  Name: Sierra Wright MRN: [id number] Date of Birth: 02/08/1934  Transition of Care [facility]) CM/SW Contact:  Lorri Frederick, LCSW Phone Number: 10/23/2021, 1:58 PM   Clinical Narrative:  Pt discharging to [facility].  RN call [phone] for report.   PTAR called [phone].    Final next level of care: Skilled Nursing Facility Barriers to Discharge: Barriers Resolved   Patient Goals and CMS Choice        Discharge Placement              Patient chooses bed at:  [facility]) Patient to be transferred to facility by: PTAR Name of family member notified: daughter Sherrie Patient and family notified of of transfer: 10/23/21  Discharge Plan and Services                                     Social Determinants of Health (SDOH) Interventions     Readmission Risk Interventions No flowsheet data found.

## 2021-10-23 NOTE — Progress Notes (Signed)
RN attempted report at [facility]. Number left for callback.

## 2021-10-23 NOTE — Care Management Important Message (Signed)
Important Message  Patient Details  Name: Sierra Wright MRN: [id number] Date of Birth: 10/25/1934   Medicare Important Message Given:  Yes     Wadie Lessen 10/23/2021, 2:55 PM

## 2021-10-23 NOTE — Progress Notes (Incomplete Revision)
Pt was resting @ 0030 when pt woke she was confused about where she was and called the police for help. Nurse reoriented pt to time/situation and place. Pt was calm and resting call bell in reach and bed at lowest position.  @0530  CCMD reported 2.18 sec pause no new order given will cont. to monitor.

## 2021-10-23 NOTE — Discharge Summary (Addendum)
Physician Discharge Summary  [name] [id number] DOB: September 15, 1934 DOA: 10/11/2021  PCP: [name], MD  Admit date: 10/11/2021 Discharge date: 10/23/2021  Time spent: 35 minutes  Recommendations for Outpatient Follow-up:  Follow-up with orthopedics Dr. Sharol Given in 2 weeks PCP in 1 week, newly started on Eliquis for A. Fib, titrate diuretics based on volume status Follow-up with cardiology in 1 month SNF for short-term rehab  Discharge Diagnoses:  Principal Problem:   Ankle fracture Bilateral ankle fractures Osteoporosis Fall Paroxysmal atrial fibrillation Type 2 diabetes mellitus Chronic diastolic CHF CAD/CABG History of CVA   Essential hypertension   RLS (restless legs syndrome)   AKI (acute kidney injury) ([place])   Anemia   PVD (peripheral vascular disease) ([place])   Discharge Condition: Stable  Diet recommendation: Diabetic, low-sodium, heart healthy  Filed Weights   10/11/21 1805 10/22/21 0800 10/23/21 0500  Weight: 95.3 kg 106.5 kg 105.3 kg    History of present illness:  Sierra Wright is an 87 years old female with past medical history of coronary artery disease/CABG, chronic diastolic heart failure, type 2 diabetes, CVA, hypertension, hypothyroidism, osteoarthritis presented to the hospital on 10/11/2021 from PCP office with history of mechanical fall with pain in the both ankles.  Patient did not lose consciousness.  In the ED creatinine was elevated at 2.2.  Right ankle x-ray showed bimalleolar acute displaced fracture and left ankle showed indeterminate nondisplaced distal  fibular fracture with lateral ankle subcutaneous soft tissue edema.  ED provider attempted a manual reduction but was unsuccessful so orthopedics was consulted.  Patient was then admitted hospital for further evaluation and treatment.    Hospital Course:    Osteoporotic right ankle bimalleolar displaced fracture and left nondisplaced distal fibular fracture  -Seen by orthopedics.   Risk of surgery was very high in the setting of peripheral vascular disease and severe osteoporosis.  Orthopedics Dr Sharol Given recommended nonweightbearing and nonsurgical management for 3 weeks.  May apply pressure through the fourth lower extremity for transfers only.  Recommended outpatient follow-up in 2 weeks with splint.  Patient has been considered for skilled nursing facility placement at this time.     Atrial flutter/A. Fib Controlled in normal sinus rhythm at this time.  Has been started on Eliquis for CHA2DS2-VASc score of 3 or more.   Acute renal failure Creatinine was elevated at 2.2 on admission.  Currently at 1.5.  We will continue to monitor baseline creatinine around 1.2.  Check BMP in 3 to 4 days.     Hyperkalemia Resolved.   CAD s/p CABG/Chronic diastolic CHF Has been restarted on metoprolol.  Lasix and benazepril resumed at a lower dose.  Aspirin and Plavix has been discontinued while on Eliquis    Essential hypertension Continue metoprolol, amlodipine, hydralazine and Imdur .  Benazepril and Lasix has been adjusted.    Type 2 diabetes mellitus Hemoglobin A1c 6.5, well controlled.  Home regimen includes glimepiride 1 mg p.o. daily, Lantus.  Lantus has been resumed on discharge on the lower dose.  Hypothyroidism Continue Synthroid  Hx CVA Allergic to statins.  On Eliquis.   Restless leg syndrome.: Requip and Sinemet.   Subjective. Today, patient was seen and examined at bedside.  Complains of being in the hospital being frustrated with her legs.  Discharge Exam: Vitals:   10/22/21 1933 10/23/21 0755  BP: (!) 126/44 134/64  Pulse: (!) 54   Resp: 18   Temp: 97.7 F (36.5 C) 97.9 F (36.6 C)  SpO2: 98% 97%  General:  Average built, not in obvious distress, feels frustrated, obese HENT:   No scleral pallor or icterus noted. Oral mucosa is moist.  Chest:   Diminished breath sounds bilaterally. CVS: S1 &S2 heard. No murmur.  Regular rate and rhythm. Abdomen:  Soft, nontender, nondistended.  Bowel sounds are heard.   Extremities: Bilateral ankle with splint in place. Psych: Alert, awake and oriented, normal mood CNS:  No cranial nerve deficits.  Power equal in all extremities.   Skin: Warm and dry.  No rashes noted.  Discharge Instructions  Allergies as of 10/23/2021       Reactions   Duloxetine Hcl Nausea And Vomiting, Other (See Comments)   Statins Other (See Comments)   Causes myalgias Other reaction(s): Unknown   Amitriptyline Other (See Comments)   Pt. States that it caused it to be suicidal.    Benadryl [diphenhydramine Hcl (sleep)] Other (See Comments)   Makes the patient very "hyper"   Ezetimibe    Other reaction(s): Unknown Other reaction(s): Unknown   Metformin Hcl    Other reaction(s): Unknown Other reaction(s): Unknown   Other    Other reaction(s): Unknown   Sitagliptin    Other reaction(s): Unknown Other reaction(s): Unknown   Tylenol [acetaminophen] Nausea Only   Colesevelam Hcl Other (See Comments)   Causes dizziness Other reaction(s): Unknown        Medication List     STOP taking these medications    aspirin 81 MG EC tablet   clopidogrel 75 MG tablet Commonly known as: PLAVIX   diazepam 10 MG tablet Commonly known as: VALIUM   gabapentin 100 MG capsule Commonly known as: NEURONTIN   HYDROcodone-acetaminophen 5-325 MG tablet Commonly known as: Norco   potassium chloride 10 MEQ tablet Commonly known as: KLOR-CON       TAKE these medications    amLODipine 5 MG tablet Commonly known as: NORVASC Take 1 tablet by mouth once daily   apixaban 5 MG Tabs tablet Commonly known as: ELIQUIS Take 1 tablet (5 mg total) by mouth 2 (two) times daily.   BD Pen Needle Nano 2nd Gen 32G X 4 MM Misc Generic drug: Insulin Pen Needle See admin instructions.   BD Pen Needle Nano 2nd Gen 32G X 4 MM Misc Generic drug: Insulin Pen Needle daily. use as directed   benazepril 10 MG tablet Commonly known  as: LOTENSIN Take 1 tablet (10 mg total) by mouth daily. What changed:  medication strength how much to take   calcium carbonate 1250 (500 Ca) MG tablet Commonly known as: OS-CAL - dosed in mg of elemental calcium Take 1 tablet by mouth daily.   carbidopa-levodopa 25-100 MG tablet Commonly known as: SINEMET IR Take 1.5 tablets by mouth at bedtime.   cephALEXin 500 MG capsule Commonly known as: KEFLEX Take 1 capsule (500 mg total) by mouth 2 (two) times daily for 3 days.   cholecalciferol 25 MCG (1000 UNIT) tablet Commonly known as: VITAMIN D3 Take 1,000 Units by mouth daily.   furosemide 40 MG tablet Commonly known as: LASIX Take 1 tablet (40 mg total) by mouth daily. What changed: when to take this   Garlic 123XX123 MG Caps Take 1,000 mg by mouth daily.   glimepiride 1 MG tablet Commonly known as: AMARYL Take 1 mg by mouth daily with breakfast.   hydrALAZINE 25 MG tablet Commonly known as: APRESOLINE TAKE 1 TABLET BY MOUTH THREE TIMES DAILY What changed: when to take this   isosorbide mononitrate 30 MG  24 hr tablet Commonly known as: IMDUR Take 1 tablet by mouth once daily   Lantus SoloStar 100 UNIT/ML Solostar Pen Generic drug: insulin glargine Inject 20 Units into the skin daily. What changed: how much to take   levothyroxine 100 MCG tablet Commonly known as: SYNTHROID Take 100 mcg by mouth daily before breakfast.   methocarbamol 500 MG tablet Commonly known as: ROBAXIN Take 1 tablet (500 mg total) by mouth every 6 (six) hours as needed for muscle spasms.   metoprolol succinate 25 MG 24 hr tablet Commonly known as: TOPROL-XL Take 1 tablet by mouth once daily   nitroGLYCERIN 0.4 MG SL tablet Commonly known as: NITROSTAT DISSOLVE ONE TABLET UNDER THE TONGUE EVERY 5 MINUTES AS NEEDED FOR CHEST PAIN. What changed: See the new instructions.   ondansetron 4 MG tablet Commonly known as: ZOFRAN Take 4 mg by mouth every 6 (six) hours as needed for nausea.    OneTouch Delica Plus 0000000 Misc Apply topically 2 (two) times daily.   OneTouch Verio test strip Generic drug: glucose blood SMARTSIG:Strip(s) Via Meter Twice Daily   oxyCODONE 5 MG immediate release tablet Commonly known as: Roxicodone Take 1-2 tablets (5-10 mg total) by mouth every 6 (six) hours as needed for severe pain. What changed: how much to take   polyethylene glycol 17 g packet Commonly known as: MIRALAX / GLYCOLAX Take 17 g by mouth daily.   pregabalin 50 MG capsule Commonly known as: LYRICA Take 50 mg by mouth 2 (two) times daily.   senna-docusate 8.6-50 MG tablet Commonly known as: Senokot-S Take 1 tablet by mouth 2 (two) times daily.   vitamin B-12 1000 MCG tablet Commonly known as: CYANOCOBALAMIN Take 1,000 mcg by mouth daily.   vitamin C 1000 MG tablet Take 1,000 mg by mouth daily.               Durable Medical Equipment  (From admission, onward)           Start     Ordered   10/21/21 0808  For home use only DME lightweight manual wheelchair with seat cushion  Once       Comments: Patient suffers from bilateral fractures which impairs their ability to perform daily activities like bathing in the home.  A walker will not resolve  issue with performing activities of daily living. A wheelchair will allow patient to safely perform daily activities. Patient is not able to propel themselves in the home using a standard weight wheelchair due to general weakness. Patient can self propel in the lightweight wheelchair. Length of need 12 months . Accessories: elevating leg rests (ELRs), wheel locks, extensions and anti-tippers.   10/21/21 [id number]                 Contact information for follow-up providers     Newt Minion, MD Follow up in 2 week(s).   Specialty: Orthopedic Surgery Contact information: [place] [address] [phone]              Contact information for after-discharge care     Destination      [facility] Preferred SNF .   Service: Skilled Nursing Contact information: [id number] [phone]                      The results of significant diagnostics from this hospitalization (including imaging, microbiology, ancillary and laboratory) are listed below for reference.  Significant Diagnostic Studies: DG Pelvis 1-2 Views  Result Date: 10/11/2021 CLINICAL DATA:  Status post fall.  Pain EXAM: PELVIS - 1-2 VIEW COMPARISON:  None. FINDINGS: Markedly limited evaluation due to overlapping osseous structures and overlying soft tissues. There is no evidence of pelvic fracture or diastasis. No pelvic bone lesions are seen. Severe degenerative changes left hip. Degenerative changes of the sacroiliac joints. No hip dislocation. Vascular calcification. IMPRESSION: 1. Markedly limited evaluation due to overlapping osseous structures and overlying soft tissues. 2. No definite acute displaced fracture, hip dislocation, or pelvic diastasis. Electronically Signed   By: Iven Finn M.D.   On: 10/11/2021 21:00   DG Ankle Complete Left  Addendum Date: 10/11/2021   ADDENDUM REPORT: 10/11/2021 20:56 ADDENDUM: Findings should state no evidence of "severe" arthropathy or other focal bone abnormality. Findings should also mention diffuse decreased bone density. Electronically Signed   By: Iven Finn M.D.   On: 10/11/2021 20:56   Result Date: 10/11/2021 CLINICAL DATA:  fall pain EXAM: LEFT ANKLE COMPLETE - 3+ VIEW COMPARISON:  X-ray left tibia fibula 03/02/2005 FINDINGS: Age-indeterminate nondisplaced distal fibular fracture. There is no evidence of definite acute displaced fracture, dislocation, or joint effusion. Degenerative changes of the navicular noted. Plantar calcaneal spur. There is no evidence of arthropathy or other focal bone abnormality. Mild lateral ankle subcutaneus soft tissue edema. Vascular calcifications.  IMPRESSION: indeterminate nondisplaced distal fibular fracture. Associated lateral ankle subcutaneus soft tissue edema. Correlate with point tenderness to evaluate for acuity. Electronically Signed: By: Iven Finn M.D. On: 10/11/2021 20:53   DG Ankle Complete Right  Result Date: 10/11/2021 CLINICAL DATA:  Status fall EXAM: RIGHT ANKLE - COMPLETE 3+ VIEW COMPARISON:  None. FINDINGS: Diffusely decreased bone density. Acute inferiorly displaced medial malleolar fracture. Acute displaced distal fibular fracture. No definite posterior malleolar fracture. Vague cortical irregularity the medial talus with no definite acute displaced talar fracture. Degenerative changes of the midfoot. Plantar calcaneal spur. There is no evidence of severe arthropathy or other focal bone abnormality. Subcutaneus soft tissue edema of the ankle. Vascular clips overlie the soft tissues of the right distal leg. Vascular calcifications. IMPRESSION: 1. Bimalleolar acute displaced fracture. 2. Vague cortical irregularity the medial talus with no definite acute displaced talar fracture. 3. Diffusely decreased bone density. Electronically Signed   By: Iven Finn M.D.   On: 10/11/2021 20:57   CT ANKLE RIGHT WO CONTRAST  Result Date: 10/12/2021 CLINICAL DATA:  Fracture, ankle EXAM: CT OF THE RIGHT ANKLE WITHOUT CONTRAST TECHNIQUE: Multidetector CT imaging of the right ankle was performed according to the standard protocol. Multiplanar CT image reconstructions were also generated. COMPARISON:  Ankle radiograph 10/11/2021 FINDINGS: Bones/Joint/Cartilage There is a trimalleolar ankle fracture. Oblique distal fibular fracture has minimal 2 mm posterolateral displacement. Medial malleolar fracture has minimal 1-2 mm placement. Posterior malleolar fracture has proximally 1 mm posterior displacement and involves approximately 15% of the articular surface. Tiny fracture of the anterolateral distal tibia consistent with anterior inferior  tibiofibular ligament avulsion (series 6, image 42). There is a splint in place. The ankle mortise appears well aligned. Osteopenia. There is mild posterior subtalar, talonavicular, calcaneocuboid, navicular-cuneiform and diffuse tarsometatarsal joint osteoarthritis. Ligaments Suboptimally assessed by CT. Muscles and Tendons Generalized muscle atrophy.  No evidence of tendon entrapment. Soft tissues Extensive soft tissue swelling. IMPRESSION: Trimalleolar ankle fracture with minimal 1-2 mm displacement. The posterior malleolar fracture involves approximately 15% of the articular surface. Tiny additional fracture of the anterolateral distal tibia consistent with an anterior inferior tibiofibular  ligament avulsion. Diffuse osteopenia. Generalized muscle atrophy. Mild hindfoot and midfoot osteoarthritis. Extensive soft tissue swelling. Electronically Signed   By: Maurine Simmering M.D.   On: 10/12/2021 08:59   US RENAL  Result Date: 10/21/2021 CLINICAL DATA:  AK I EXAM: RENAL / URINARY TRACT ULTRASOUND COMPLETE COMPARISON:  CT abdomen pelvis 01/15/2018 FINDINGS: Right Kidney: Renal measurements: 9.9 x 4.5 x 4.3 cm = volume: 100 mL. Echogenicity within normal limits. There is a cyst measuring 1.3 cm. No solid mass. No hydronephrosis. Left Kidney: Renal measurements: 10.2 x 5.3 x 5.6 cm = volume: 160 mL. Echogenicity within normal limits. No mass or hydronephrosis visualized. Bladder: Appears normal for degree of bladder distention. Other: None. IMPRESSION: No acute finding in the bilateral kidneys. Electronically Signed   By: Audie Pinto M.D.   On: 10/21/2021 15:11   CT ANKLE LEFT WO CONTRAST  Result Date: 10/15/2021 CLINICAL DATA:  Fracture, ankle EXAM: CT OF THE LEFT ANKLE WITHOUT CONTRAST TECHNIQUE: Multidetector CT imaging of the left ankle was performed according to the standard protocol. Multiplanar CT image reconstructions were also generated. COMPARISON:  Radiograph 10/11/2021 FINDINGS:  Bones/Joint/Cartilage There is a minimally displaced distal fibular fracture, with 2 mm posterior displacement. There is an anterolateral distal tibia fracture consistent with an anterior inferior tibiofibular ligament avulsion. Diffuse osteopenia. No other additional fracture. There is no significant tibiotalar osteoarthritis. There is mild talonavicular and calcaneocuboid osteoarthritis. Ligaments Suboptimally assessed by CT. Muscles and Tendons And no evidence of tendon entrapment.  Generalized muscle atrophy. Soft tissues Lateral ankle soft tissue swelling. IMPRESSION: Minimally displaced distal fibula fracture. Anterior inferior tibiofibular ligament avulsion fracture of the distal tibia. Electronically Signed   By: Maurine Simmering M.D.   On: 10/15/2021 17:03   MR Hip Left w/o contrast  Result Date: 10/05/2021 CLINICAL DATA:  Left hip pain EXAM: MR OF THE LEFT HIP WITHOUT CONTRAST TECHNIQUE: Multiplanar, multisequence MR imaging was performed. No intravenous contrast was administered. COMPARISON:  Hip radiograph 09/20/2021 FINDINGS: Bones: There is severe left hip arthritis with subchondral fracture of the femoral head, articular surface flattening, and periarticular bony edema and subchondral cystic change. No focal bone lesion. The visualized sacroiliac joints and symphysis pubis appear normal. Lower lumbar spine degenerative disc disease, partially visualized. Articular cartilage and labrum Articular cartilage: Severe chondrosis with essentially full-thickness cartilage loss. Labrum: Diffuse labral degeneration and tearing. Joint or bursal effusion Joint effusion: Trace joint effusion. Bursae: No evidence of trochanteric bursitis. Muscles and tendons Muscles and tendons: The gluteal tendons are intact. The proximal hamstrings are intact.Reactive edema within the left hip adductors. There is left greater than right gluteal muscle atrophy. Other findings Miscellaneous: Prior hysterectomy. IMPRESSION: Severe  left hip osteoarthritis with subchondral fracture of the femoral head and articular surface flattening/partial collapse. Diffuse labral degeneration and tearing. Electronically Signed   By: Maurine Simmering M.D.   On: 10/05/2021 14:46   DG CHEST PORT 1 VIEW  Result Date: 10/20/2021 CLINICAL DATA:  Leukocytosis, constipation EXAM: PORTABLE CHEST 1 VIEW COMPARISON:  Chest radiograph 01/20/2020 FINDINGS: Median sternotomy wires and mediastinal surgical clips are again seen. The heart is at the upper limits of normal for size. There is calcified atherosclerotic plaque of the aortic arch. The mediastinal contours are normal. Linear opacities in the left mid lung and base likely reflect atelectasis or scar. There is no focal consolidation. There is no pulmonary edema. There is no pleural effusion or pneumothorax. There is degenerative change of the right shoulder. There is no acute osseous abnormality.  IMPRESSION: 1. Left basilar atelectasis or scar. No radiographic evidence of acute cardiopulmonary process. 2. Unchanged borderline cardiomegaly. Electronically Signed   By: Lesia Hausen M.D.   On: 10/20/2021 10:04   DG Ankle Right Port  Result Date: 10/11/2021 CLINICAL DATA:  Post reduction. EXAM: PORTABLE RIGHT ANKLE - 2 VIEW COMPARISON:  10/11/2021. FINDINGS: Examination is limited due to overlying casting material. There is diffusely decreased mineralization of the bones. There is redemonstration of an oblique fracture of the distal fibular shaft and medial malleolus and possible fracture of the posterior tibia. Alignment is unchanged and there is no dislocation. A moderate calcaneal spurs present. Soft tissue swelling is noted about the ankle. IMPRESSION: Fractures of the distal fibula and medial malleolus, and possible fracture of the posterior tibia. Alignment is unchanged. Electronically Signed   By: Thornell Sartorius M.D.   On: 10/11/2021 23:47   DG Abd Portable 1V  Result Date: 10/20/2021 CLINICAL DATA:   Leukocytosis, constipation EXAM: PORTABLE ABDOMEN - 1 VIEW COMPARISON:  None. FINDINGS: There is a nonobstructive bowel gas pattern. There is no abnormally large colonic stool burden. There is no definite free intraperitoneal air, suboptimally evaluated given supine technique. Right upper quadrant and midline upper abdominal surgical clips are noted. There is no gross organomegaly or abnormal soft tissue calcification. There is multilevel degenerative change of the lumbar spine and marked degenerative change of the left hip. IMPRESSION: Nonobstructive bowel gas pattern. No abnormally large colonic stool burden. Electronically Signed   By: Lesia Hausen M.D.   On: 10/20/2021 10:05   XR C-ARM NO REPORT  Result Date: 09/27/2021 Please see Notes tab for imaging impression.   Microbiology: Recent Results (from the past 240 hour(s))  SARS CORONAVIRUS 2 (TAT 6-24 HRS) Nasopharyngeal Nasopharyngeal Swab     Status: None   Collection Time: 10/18/21  6:35 PM   Specimen: Nasopharyngeal Swab  Result Value Ref Range Status   SARS Coronavirus 2 NEGATIVE NEGATIVE Final    Comment: (NOTE) SARS-CoV-2 target nucleic acids are NOT DETECTED.  The SARS-CoV-2 RNA is generally detectable in upper and lower respiratory specimens during the acute phase of infection. Negative results do not preclude SARS-CoV-2 infection, do not rule out co-infections with other pathogens, and should not be used as the sole basis for treatment or other patient management decisions. Negative results must be combined with clinical observations, patient history, and epidemiological information. The expected result is Negative.  Fact Sheet for Patients: HairSlick.no  Fact Sheet for Healthcare Providers: quierodirigir.com  This test is not yet approved or cleared by the [place] FDA and  has been authorized for detection and/or diagnosis of SARS-CoV-2 by FDA under an  Emergency Use Authorization (EUA). This EUA will remain  in effect (meaning this test can be used) for the duration of the COVID-19 declaration under Se ction 564(b)(1) of the Act, 21 U.S.C. section 360bbb-3(b)(1), unless the authorization is terminated or revoked sooner.  Performed at [facility], [address], [place], [place] [address]   Urine Culture     Status: Abnormal   Collection Time: 10/20/21  8:21 AM   Specimen: Urine, Catheterized  Result Value Ref Range Status   Specimen Description URINE, CATHETERIZED  Final   Special Requests   Final    NONE Performed at [facility], [address], [place], [place] [address]    Culture 70,000 COLONIES/mL KLEBSIELLA PNEUMONIAE (A)  Final   Report Status 10/22/2021 FINAL  Final   Organism ID,  Bacteria KLEBSIELLA PNEUMONIAE (A)  Final      Susceptibility   Klebsiella pneumoniae - MIC*    AMPICILLIN >=32 RESISTANT Resistant     CEFAZOLIN <=4 SENSITIVE Sensitive     CEFEPIME <=0.12 SENSITIVE Sensitive     CEFTRIAXONE <=0.25 SENSITIVE Sensitive     CIPROFLOXACIN <=0.25 SENSITIVE Sensitive     GENTAMICIN <=1 SENSITIVE Sensitive     IMIPENEM <=0.25 SENSITIVE Sensitive     NITROFURANTOIN <=16 SENSITIVE Sensitive     TRIMETH/SULFA <=20 SENSITIVE Sensitive     AMPICILLIN/SULBACTAM 4 SENSITIVE Sensitive     PIP/TAZO <=4 SENSITIVE Sensitive     * 70,000 COLONIES/mL KLEBSIELLA PNEUMONIAE  Culture, blood (Routine X 2) w Reflex to ID Panel     Status: None (Preliminary result)   Collection Time: 10/20/21  9:46 AM   Specimen: BLOOD  Result Value Ref Range Status   Specimen Description BLOOD RIGHT ANTECUBITAL  Final   Special Requests   Final    BOTTLES DRAWN AEROBIC ONLY Blood Culture results may not be optimal due to an inadequate volume of blood received in culture bottles   Culture   Final    NO GROWTH 2 DAYS Performed at [facility] [address] [place], [place] [address]    Report Status PENDING  Incomplete   Culture, blood (Routine X 2) w Reflex to ID Panel     Status: None (Preliminary result)   Collection Time: 10/20/21  9:58 AM   Specimen: BLOOD LEFT HAND  Result Value Ref Range Status   Specimen Description BLOOD LEFT HAND  Final   Special Requests   Final    BOTTLES DRAWN AEROBIC ONLY Blood Culture results may not be optimal due to an inadequate volume of blood received in culture bottles   Culture   Final    NO GROWTH 2 DAYS Performed at [facility], [place] [address], [place], [place] [address]    Report Status PENDING  Incomplete     Labs: Basic Metabolic Panel: Recent Labs  Lab 10/18/21 0200 10/20/21 0216 10/21/21 0156 10/22/21 0309 10/23/21 0551  NA 128* 131* 127* 128* 132*  K 4.3 4.3 4.8 4.8 4.9  CL 100 99 98 99 103  CO2 20* 25 20* 20* 23  GLUCOSE 195* 128* 193* 173* 175*  BUN 27* 35* 53* 63* 50*  CREATININE 1.14* 1.45* 2.02* 2.04* 1.51*  CALCIUM 7.9* 8.3* 8.0* 7.7* 7.9*  MG  --  1.8 1.9  --   --     Liver Function Tests: No results for input(s): AST, ALT, ALKPHOS, BILITOT, PROT, ALBUMIN in the last 168 hours.  No results for input(s): LIPASE, AMYLASE in the last 168 hours. No results for input(s): AMMONIA in the last 168 hours. CBC: Recent Labs  Lab 10/17/21 0326 10/20/21 0216 10/21/21 0156 10/22/21 0309 10/23/21 0551  WBC 10.1 21.8* 24.1* 16.7* 11.4*  NEUTROABS  --   --  20.2* 13.1* 8.2*  HGB 11.1* 11.1* 10.0* 10.0* 10.7*  HCT 34.9* 34.0* 30.3* 30.5* 32.3*  MCV 98.0 96.0 96.2 95.9 95.3  PLT 207 244 235 232 263    Cardiac Enzymes: No results for input(s): CKTOTAL, CKMB, CKMBINDEX, TROPONINI in the last 168 hours. BNP: BNP (last 3 results) No results for input(s): BNP in the last 8760 hours.  ProBNP (last 3 results) No results for input(s): PROBNP in the last 8760 hours.  CBG: Recent Labs  Lab 10/22/21 0831 10/22/21 1248 10/22/21 1528 10/22/21 2247 10/23/21 0818  GLUCAP 232* 214* 223* 147* 178*    Signed:  Flora Lipps MD.   [facility] 10/23/2021, 9:54 AM

## 2021-10-23 NOTE — Progress Notes (Incomplete)
RN received report from previous shift & care assumed.

## 2021-10-23 NOTE — Consult Note (Signed)
   [facility] Inpatient Consult   10/23/2021  ANALIESE KRUPKA 1934-04-26 [id number]  [facility] [[facility]]  Accountable Care Organization [ACO] Patient: [employer] Medicare  Primary Care Provider:  [name], MD, [facility]   Patient screened for length of stay hospitalization with extreme noted high risk score for unplanned readmission risk.  Review of patient's medical record reveals patient is for a skilled nursing facility level of care for transition.  Plan:  No current [facility] follow up needs. .    For questions contact:   Charlesetta Shanks, RN BSN CCM [facility]  [phone] business mobile phone Toll free office [phone]  Fax number: [phone] [email or web address]

## 2021-10-24 ENCOUNTER — Emergency Department (HOSPITAL_COMMUNITY): Payer: Medicare Other

## 2021-10-24 ENCOUNTER — Other Ambulatory Visit: Payer: Self-pay

## 2021-10-24 ENCOUNTER — Encounter: Payer: Self-pay | Admitting: Adult Health

## 2021-10-24 ENCOUNTER — Emergency Department (HOSPITAL_COMMUNITY)
Admission: EM | Admit: 2021-10-24 | Discharge: 2021-10-24 | Disposition: A | Discharge status: Skilled Nursing Facility | Payer: Medicare Other | Attending: Emergency Medicine | Admitting: Emergency Medicine

## 2021-10-24 DIAGNOSIS — I2510 Atherosclerotic heart disease of native coronary artery without angina pectoris: Secondary | ICD-10-CM | POA: Insufficient documentation

## 2021-10-24 DIAGNOSIS — R059 Cough, unspecified: Secondary | ICD-10-CM | POA: Diagnosis not present

## 2021-10-24 DIAGNOSIS — S82831D Other fracture of upper and lower end of right fibula, subsequent encounter for closed fracture with routine healing: Secondary | ICD-10-CM | POA: Diagnosis not present

## 2021-10-24 DIAGNOSIS — M25571 Pain in right ankle and joints of right foot: Secondary | ICD-10-CM | POA: Diagnosis not present

## 2021-10-24 DIAGNOSIS — R079 Chest pain, unspecified: Secondary | ICD-10-CM | POA: Diagnosis not present

## 2021-10-24 DIAGNOSIS — W1839XD Other fall on same level, subsequent encounter: Secondary | ICD-10-CM | POA: Insufficient documentation

## 2021-10-24 DIAGNOSIS — E039 Hypothyroidism, unspecified: Secondary | ICD-10-CM | POA: Diagnosis not present

## 2021-10-24 DIAGNOSIS — Z7901 Long term (current) use of anticoagulants: Secondary | ICD-10-CM | POA: Insufficient documentation

## 2021-10-24 DIAGNOSIS — S82899D Other fracture of unspecified lower leg, subsequent encounter for closed fracture with routine healing: Secondary | ICD-10-CM

## 2021-10-24 DIAGNOSIS — S82832D Other fracture of upper and lower end of left fibula, subsequent encounter for closed fracture with routine healing: Secondary | ICD-10-CM | POA: Diagnosis not present

## 2021-10-24 DIAGNOSIS — S8251XD Displaced fracture of medial malleolus of right tibia, subsequent encounter for closed fracture with routine healing: Secondary | ICD-10-CM | POA: Insufficient documentation

## 2021-10-24 DIAGNOSIS — E1151 Type 2 diabetes mellitus with diabetic peripheral angiopathy without gangrene: Secondary | ICD-10-CM | POA: Diagnosis not present

## 2021-10-24 DIAGNOSIS — R062 Wheezing: Secondary | ICD-10-CM

## 2021-10-24 DIAGNOSIS — S99911D Unspecified injury of right ankle, subsequent encounter: Secondary | ICD-10-CM | POA: Diagnosis present

## 2021-10-24 DIAGNOSIS — Z79899 Other long term (current) drug therapy: Secondary | ICD-10-CM | POA: Diagnosis not present

## 2021-10-24 DIAGNOSIS — E1142 Type 2 diabetes mellitus with diabetic polyneuropathy: Secondary | ICD-10-CM | POA: Insufficient documentation

## 2021-10-24 DIAGNOSIS — R0602 Shortness of breath: Secondary | ICD-10-CM

## 2021-10-24 DIAGNOSIS — M25572 Pain in left ankle and joints of left foot: Secondary | ICD-10-CM | POA: Diagnosis not present

## 2021-10-24 DIAGNOSIS — Z951 Presence of aortocoronary bypass graft: Secondary | ICD-10-CM | POA: Diagnosis not present

## 2021-10-24 DIAGNOSIS — I110 Hypertensive heart disease with heart failure: Secondary | ICD-10-CM | POA: Diagnosis not present

## 2021-10-24 DIAGNOSIS — Z794 Long term (current) use of insulin: Secondary | ICD-10-CM | POA: Diagnosis not present

## 2021-10-24 DIAGNOSIS — E11319 Type 2 diabetes mellitus with unspecified diabetic retinopathy without macular edema: Secondary | ICD-10-CM | POA: Insufficient documentation

## 2021-10-24 DIAGNOSIS — S82851A Displaced trimalleolar fracture of right lower leg, initial encounter for closed fracture: Secondary | ICD-10-CM | POA: Diagnosis not present

## 2021-10-24 DIAGNOSIS — I5032 Chronic diastolic (congestive) heart failure: Secondary | ICD-10-CM | POA: Diagnosis not present

## 2021-10-24 DIAGNOSIS — I517 Cardiomegaly: Secondary | ICD-10-CM | POA: Diagnosis not present

## 2021-10-24 LAB — I-STAT VENOUS BLOOD GAS, ED
Acid-base deficit: 1 mmol/L (ref 0.0–2.0)
Bicarbonate: 22.7 mmol/L (ref 20.0–28.0)
Calcium, Ion: 1.13 mmol/L — ABNORMAL LOW (ref 1.15–1.40)
HCT: 30 % — ABNORMAL LOW (ref 36.0–46.0)
Hemoglobin: 10.2 g/dL — ABNORMAL LOW (ref 12.0–15.0)
O2 Saturation: 100 %
Potassium: 4.5 mmol/L (ref 3.5–5.1)
Sodium: 136 mmol/L (ref 135–145)
TCO2: 24 mmol/L (ref 22–32)
pCO2, Ven: 32.2 mmHg — ABNORMAL LOW (ref 44.0–60.0)
pH, Ven: 7.456 — ABNORMAL HIGH (ref 7.250–7.430)
pO2, Ven: 161 mmHg — ABNORMAL HIGH (ref 32.0–45.0)

## 2021-10-24 LAB — COMPREHENSIVE METABOLIC PANEL
ALT: 20 U/L (ref 0–44)
AST: 20 U/L (ref 15–41)
Albumin: 2.2 g/dL — ABNORMAL LOW (ref 3.5–5.0)
Alkaline Phosphatase: 89 U/L (ref 38–126)
Anion gap: 10 (ref 5–15)
BUN: 35 mg/dL — ABNORMAL HIGH (ref 8–23)
CO2: 19 mmol/L — ABNORMAL LOW (ref 22–32)
Calcium: 8.3 mg/dL — ABNORMAL LOW (ref 8.9–10.3)
Chloride: 107 mmol/L (ref 98–111)
Creatinine, Ser: 1.16 mg/dL — ABNORMAL HIGH (ref 0.44–1.00)
GFR, Estimated: 46 mL/min — ABNORMAL LOW (ref >60)
Glucose, Bld: 190 mg/dL — ABNORMAL HIGH (ref 70–99)
Potassium: 4.7 mmol/L (ref 3.5–5.1)
Sodium: 136 mmol/L (ref 135–145)
Total Bilirubin: 0.6 mg/dL (ref 0.3–1.2)
Total Protein: 4.9 g/dL — ABNORMAL LOW (ref 6.5–8.1)

## 2021-10-24 LAB — CBC WITH DIFFERENTIAL/PLATELET
Abs Immature Granulocytes: 0.27 10*3/uL — ABNORMAL HIGH (ref 0.00–0.07)
Basophils Absolute: 0.1 10*3/uL (ref 0.0–0.1)
Basophils Relative: 1 %
Eosinophils Absolute: 0.1 10*3/uL (ref 0.0–0.5)
Eosinophils Relative: 2 %
HCT: 43.3 % (ref 36.0–46.0)
Hemoglobin: 14 g/dL (ref 12.0–15.0)
Immature Granulocytes: 4 %
Lymphocytes Relative: 16 %
Lymphs Abs: 1.3 10*3/uL (ref 0.7–4.0)
MCH: 31.2 pg (ref 26.0–34.0)
MCHC: 32.3 g/dL (ref 30.0–36.0)
MCV: 96.4 fL (ref 80.0–100.0)
Monocytes Absolute: 0.6 10*3/uL (ref 0.1–1.0)
Monocytes Relative: 8 %
Neutro Abs: 5.4 10*3/uL (ref 1.7–7.7)
Neutrophils Relative %: 69 %
Platelets: 226 10*3/uL (ref 150–400)
RBC: 4.49 MIL/uL (ref 3.87–5.11)
RDW: 14 % (ref 11.5–15.5)
WBC: 7.8 10*3/uL (ref 4.0–10.5)
nRBC: 0 % (ref 0.0–0.2)

## 2021-10-24 LAB — PROTIME-INR
INR: 1.3 — ABNORMAL HIGH (ref 0.8–1.2)
Prothrombin Time: 15.7 seconds — ABNORMAL HIGH (ref 11.4–15.2)

## 2021-10-24 LAB — LACTIC ACID, PLASMA
Lactic Acid, Venous: 1.3 mmol/L (ref 0.5–1.9)
Lactic Acid, Venous: 0.9 mmol/L (ref 0.5–1.9)

## 2021-10-24 LAB — TROPONIN I (HIGH SENSITIVITY)
Troponin I (High Sensitivity): 31 ng/L — ABNORMAL HIGH (ref <18)
Troponin I (High Sensitivity): 33 ng/L — ABNORMAL HIGH (ref <18)

## 2021-10-24 LAB — BRAIN NATRIURETIC PEPTIDE: B Natriuretic Peptide: 291.1 pg/mL — ABNORMAL HIGH (ref 0.0–100.0)

## 2021-10-24 MED ORDER — HYDROMORPHONE HCL 1 MG/ML IJ SOLN
0.5000 mg | Freq: Once | INTRAMUSCULAR | Status: AC
  Administered 2021-10-24: 0.5 mg via INTRAVENOUS
  Filled 2021-10-24: qty 1

## 2021-10-24 MED ORDER — MORPHINE SULFATE (PF) 4 MG/ML IV SOLN
4.0000 mg | Freq: Once | INTRAVENOUS | Status: AC
  Administered 2021-10-24: 4 mg via INTRAVENOUS
  Filled 2021-10-24: qty 1

## 2021-10-24 MED ORDER — AEROCHAMBER PLUS FLO-VU LARGE MISC
1.0000 | Freq: Once | Status: AC
  Administered 2021-10-24: 1

## 2021-10-24 MED ORDER — ALBUTEROL SULFATE HFA 108 (90 BASE) MCG/ACT IN AERS
2.0000 | INHALATION_SPRAY | Freq: Once | RESPIRATORY_TRACT | Status: AC
  Administered 2021-10-24: 2 via RESPIRATORY_TRACT
  Filled 2021-10-24: qty 6.7

## 2021-10-24 NOTE — Discharge Instructions (Signed)
1.  The splints on your ankle fractures were removed in your legs were examined.  There is no signs of pressure wounds.  Broken bones may be very painful for up to 6 weeks or more.  It is very important that you elevate as much as possible.  Try to place pillows underneath your lower legs to get the level above the level of your heart.  This will help decrease swelling.  You may continue oxycodone for pain as prescribed.  You may also take extra strength Tylenol every 6 hours.  You are due to see the orthopedic doctor for recheck in a week. 2.  You have been given an inhaler to use for wheezing.  Take 2 puffs of the inhaler every 4-6 hours for wheezing.  Also, since you are not moving very much due to ankle fractures and poor mobility, use the incentive spirometer to keep your lungs open with good airflow.  Use it every hour while awake. 3.  You are taking Lasix daily 40 mg.  For the next 2 days, take an additional dose help with fluid removal. 4.  You should see your family doctor or cardiologist for recheck within the next 3 to 7 days for recheck for your shortness of breath. 5.  You should work with the Child psychotherapist at your rehabilitation facility if you would like to explore making changes in facility or care level. 6.  Return to the emergency department if you have new worsening or increasing shortness of breath, fever, productive cough, coughing up blood or other concerning symptoms.

## 2021-10-24 NOTE — ED Notes (Signed)
Attempted report to [facility] X1. Unable d/t no answer by facility when transferred.

## 2021-10-24 NOTE — ED Triage Notes (Signed)
Pt here from [facility] d/t new cough and chest pain. Rencently d/c from [facility]. Pt removed [place] d/t being to loud. Slept RA. Woke up with new cough and CP. SpO2 88-89% RA. EMS placed pt on [place] 2liters with increase to 97%. No respiratory distress during triage.   157/68 97% 2 Liters HR 60-64

## 2021-10-24 NOTE — Progress Notes (Signed)
Location:  [facility] Room Number: 306-B Place of Service:  SNF (31) Provider:  Kenard GowerMedina-Vargas, Monina, DNP, FNP-BC  Patient Care Team: Mila PalmerWolters, Sharon, MD as PCP - General (Family Medicine) [name], Deloris PingPhilip J, MD as PCP - Cardiology (Cardiology) Glendale ChardPatel, Donika K, DO as Consulting Physician (Neurology)  Extended Emergency Contact Information Primary Emergency Contact: Kai LevinsKendrick,Robert D Address: [address]          [place], [place] [address] [place] Home Phone: [phone] Relation: Spouse Secondary Emergency Contact: DENNIS,SHERRIE Mobile Phone: [phone] Relation: Daughter  Code Status:  Full Code   Goals of care: Advanced Directive information Advanced Directives 10/24/2021  Does Patient Have a Medical Advance Directive? No  Type of Advance Directive -  Would patient like information on creating a medical advance directive? No - Patient declined     Chief Complaint  Patient presents with   Hospitalization Follow-up    Follow-up from recent hospital stay 10/11/21-10/23/21    HPI:  Pt is a 87 y.o. female seen today for medical management of chronic diseases.     Past Medical History:  Diagnosis Date   Arthritis    Coronary artery disease 1999   CABG   Depression    Diabetes mellitus without complication (HCC)    type II; metformin and Amaryl   Diabetic neuropathy (HCC)    History of anemia as a child    HOH (hard of hearing)    Hypertension    Hypothyroidism    synthroid   PONV (postoperative nausea and vomiting)    Restless leg syndrome    S/P cardiac cath 08/13/18 stable CAD 08/14/2018   Shortness of breath 09/03/12   ECHO- The LA is Moderately Dilated, mild mitral annular calcification. mild pulmonary hypertension, moderate concentric LV hypertrophy. Atrial septum is aneurysmal. Aortic valve appears to be mildly sclerotic.EF 88%   Urinary incontinence    Weakness of both legs    Past Surgical History:  Procedure  Laterality Date   ABDOMINAL HYSTERECTOMY     APPENDECTOMY     BACK SURGERY     CARDIAC CATHERIZATION  08/20/03   Widely patent bypass grafts. Normal left ventricular systolic function.   CARDIAC CATHETERIZATION     CHOLECYSTECTOMY     COLONOSCOPY     CORONARY ARTERY BYPASS GRAFT     EYE SURGERY     bilateral cataracts   LEFT HEART CATH AND CORONARY ANGIOGRAPHY N/A 08/13/2018   Procedure: LEFT HEART CATH AND CORONARY ANGIOGRAPHY;  Surgeon: Kathleene HazelMcAlhany, Christopher D, MD;  Location: [facility] INVASIVE CV LAB;  Service: Cardiovascular;  Laterality: N/A;   LUMBAR LAMINECTOMY/DECOMPRESSION MICRODISCECTOMY N/A 07/26/2015   Procedure: BILATERAL Lumbar 3-4 SUBTOTAL HEMILAMINECTOMY WITH LATERAL RECESS DECOMPRESSION;  Surgeon: Kerrin ChampagneJames E Nitka, MD;  Location: [facility] OR;  Service: Orthopedics;  Laterality: N/A;   SHOULDER SURGERY     TUBAL LIGATION      Allergies  Allergen Reactions   Duloxetine Hcl Nausea And Vomiting and Other (See Comments)   Statins Other (See Comments)    Causes myalgias Other reaction(s): Unknown   Amitriptyline Other (See Comments)    Pt. States that it caused it to be suicidal.    Benadryl [Diphenhydramine Hcl (Sleep)] Other (See Comments)    Makes the patient very "hyper"   Ezetimibe     Other reaction(s): Unknown Other reaction(s): Unknown   Metformin Hcl     Other reaction(s): Unknown Other reaction(s): Unknown   Other     Other reaction(s): Unknown  Sitagliptin     Other reaction(s): Unknown Other reaction(s): Unknown   Tylenol [Acetaminophen] Nausea Only   Colesevelam Hcl Other (See Comments)    Causes dizziness Other reaction(s): Unknown    Outpatient Encounter Medications as of 10/24/2021  Medication Sig   amLODipine (NORVASC) 5 MG tablet Take 1 tablet by mouth once daily   apixaban (ELIQUIS) 5 MG TABS tablet Take 1 tablet (5 mg total) by mouth 2 (two) times daily.   Ascorbic Acid (VITAMIN C) 1000 MG tablet Take 1,000 mg by mouth daily.   benazepril (LOTENSIN)  10 MG tablet Take 1 tablet (10 mg total) by mouth daily.   calcium carbonate (OS-CAL - DOSED IN MG OF ELEMENTAL CALCIUM) 1250 (500 Ca) MG tablet Take 1 tablet by mouth daily.   carbidopa-levodopa (SINEMET IR) 25-100 MG tablet Take 1.5 tablets by mouth at bedtime.   cephALEXin (KEFLEX) 500 MG capsule Take 1 capsule (500 mg total) by mouth 2 (two) times daily for 3 days.   cholecalciferol (VITAMIN D3) 25 MCG (1000 UNIT) tablet Take 1,000 Units by mouth daily.   furosemide (LASIX) 40 MG tablet Take 1 tablet (40 mg total) by mouth daily.   hydrALAZINE (APRESOLINE) 25 MG tablet TAKE 1 TABLET BY MOUTH THREE TIMES DAILY   isosorbide mononitrate (IMDUR) 30 MG 24 hr tablet Take 1 tablet by mouth once daily   LANTUS SOLOSTAR 100 UNIT/ML Solostar Pen Inject 20 Units into the skin daily.   levothyroxine (SYNTHROID, LEVOTHROID) 100 MCG tablet Take 100 mcg by mouth daily before breakfast.   methocarbamol (ROBAXIN) 500 MG tablet Take 500 mg by mouth every 8 (eight) hours as needed for muscle spasms. X 3 days and on 10/27/21 change to every 12 hours as needed for spasms x 5 days   metoprolol succinate (TOPROL-XL) 25 MG 24 hr tablet Take 1 tablet by mouth once daily   nitroGLYCERIN (NITROSTAT) 0.4 MG SL tablet DISSOLVE ONE TABLET UNDER THE TONGUE EVERY 5 MINUTES AS NEEDED FOR CHEST PAIN.   ondansetron (ZOFRAN) 4 MG tablet Take 4 mg by mouth every 6 (six) hours as needed for nausea.   oxyCODONE (ROXICODONE) 5 MG immediate release tablet Take 1-2 tablets (5-10 mg total) by mouth every 6 (six) hours as needed for severe pain.   polyethylene glycol (MIRALAX / GLYCOLAX) 17 g packet Take 17 g by mouth daily.   pregabalin (LYRICA) 50 MG capsule Take 50 mg by mouth 2 (two) times daily.   senna-docusate (SENOKOT-S) 8.6-50 MG tablet Take 1 tablet by mouth 2 (two) times daily.   vitamin B-12 (CYANOCOBALAMIN) 1000 MCG tablet Take 1,000 mcg by mouth daily.   BD PEN NEEDLE NANO 2ND GEN 32G X 4 MM MISC daily. use as directed    Insulin Pen Needle (BD PEN NEEDLE NANO 2ND GEN) 32G X 4 MM MISC See admin instructions.   Lancets (ONETOUCH DELICA PLUS LANCET33G) MISC Apply topically 2 (two) times daily.   ONETOUCH VERIO test strip SMARTSIG:Strip(s) Via Meter Twice Daily   [DISCONTINUED] Garlic 1000 MG CAPS Take 1,000 mg by mouth daily.    [DISCONTINUED] glimepiride (AMARYL) 1 MG tablet Take 1 mg by mouth daily with breakfast.   [DISCONTINUED] methocarbamol (ROBAXIN) 500 MG tablet Take 1 tablet (500 mg total) by mouth every 6 (six) hours as needed for muscle spasms.   No facility-administered encounter medications on file as of 10/24/2021.    Review of Systems  GENERAL: No change in appetite, no fatigue, no weight changes, no fever, chills or  weakness SKIN: Denies rash, itching, wounds, ulcer sores, or nail abnormalities EYES: Denies change in vision, dry eyes, eye pain, itching or discharge EARS: Denies change in hearing, ringing in ears, or earache NOSE: Denies nasal congestion or epistaxis MOUTH and THROAT: Denies oral discomfort, gingival pain or bleeding, pain from teeth or hoarseness   RESPIRATORY: no cough, SOB, DOE, wheezing, hemoptysis CARDIAC: No chest pain, edema or palpitations GI: No abdominal pain, diarrhea, constipation, heart burn, nausea or vomiting GU: Denies dysuria, frequency, hematuria, incontinence, or discharge MUSCULOSKELETAL: Denies joint pain, muscle pain, back pain, restricted movement, or unusual weakness CIRCULATION: Denies claudication, edema of legs, varicosities, or cold extremities NEUROLOGICAL: Denies dizziness, syncope, numbness, or headache PSYCHIATRIC: Denies feelings of depression or anxiety. No report of hallucinations, insomnia, paranoia, or agitation ENDOCRINE: Denies polyphagia, polyuria, polydipsia, heat or cold intolerance HEME/LYMPH: Denies excessive bruising, petechia, enlarged lymph nodes, or bleeding problems IMMUNOLOGIC: Denies history of frequent infections, AIDS, or  use of immunosuppressive agents   Immunization History  Administered Date(s) Administered   Influenza Split 09/13/2007, 09/02/2009, 09/02/2010, 09/03/2011, 08/19/2012, 09/22/2013, 08/29/2016, 08/14/2018   Influenza, High Dose Seasonal PF 08/14/2018, 09/30/2019   Influenza,inj,Quad PF,6+ Mos 11/09/2020   Influenza,inj,quad, With Preservative 08/05/2014, 10/18/2015   Janssen (J&J) SARS-COV-2 Vaccination 04/13/2020   PFIZER(Purple Top)SARS-COV-2 Vaccination 01/04/2021   Pneumococcal Conjugate-13 09/08/2008, 01/11/2015   Td 07/04/2007   Pertinent  Health Maintenance Due  Topic Date Due   OPHTHALMOLOGY EXAM  Never done   INFLUENZA VACCINE  07/03/2021   FOOT EXAM  01/25/2022   HEMOGLOBIN A1C  04/11/2022   DEXA SCAN  Completed   Fall Risk 10/20/2021 10/21/2021 10/21/2021 10/22/2021 10/22/2021  Falls in the past year? - - - - -  Was there an injury with Fall? - - - - -  Fall Risk Category Calculator - - - - -  Fall Risk Category - - - - -  Patient Fall Risk Level High fall risk High fall risk High fall risk High fall risk High fall risk  Patient at Risk for Falls Due to - - - - -  Fall risk Follow up - - - - -     Vitals:   10/24/21 0936  BP: 139/66  Pulse: (!) 54  Resp: 18  Temp: (!) 96.9 F (36.1 C)  SpO2: 91%   There is no height or weight on file to calculate BMI.  Physical Exam  GENERAL APPEARANCE: Well nourished. In no acute distress. Normal body habitus SKIN:  Skin is warm and dry. There are no suspicious lesions or rash HEAD: Normal in size and contour. No evidence of trauma EYES: Lids open and close normally. No blepharitis, entropion or ectropion. PERRL. Conjunctivae are clear and sclerae are white. Lenses are without opacity EARS: Pinnae are normal. Patient hears normal voice tunes of the examiner MOUTH and THROAT: Lips are without lesions. Oral mucosa is moist and without lesions. Tongue is normal in shape, size, and color and without lesions NECK: supple,  trachea midline, no neck masses, no thyroid tenderness, no thyromegaly LYMPHATICS: No LAN in the neck, no supraclavicular LAN RESPIRATORY: Breathing is even & unlabored, BS CTAB CARDIAC: RRR, no murmur,no extra heart sounds, no edema GI: Abdomen soft, normal BS, no masses, no tenderness, no hepatomegaly, no splenomegaly MUSCULOSKELETAL: No deformities. Movement at each extremity is full and painless. Strength is 5/5 at each extremity. Back is without kyphosis or scoliosis CIRCULATION: Pedal pulses are 2+. There is no edema of the legs, ankles and feet NEUROLOGICAL:  There is no tremor. Speech is clear PSYCHIATRIC: Alert and oriented X 3. Affect and behavior are appropriate  Labs reviewed: Recent Labs    10/13/21 0324 10/14/21 0339 10/20/21 0216 10/21/21 0156 10/22/21 0309 10/23/21 0551  NA 133*   < > 131* 127* 128* 132*  K 4.4   < > 4.3 4.8 4.8 4.9  CL 106   < > 99 98 99 103  CO2 20*   < > 25 20* 20* 23  GLUCOSE 173*   < > 128* 193* 173* 175*  BUN 23   < > 35* 53* 63* 50*  CREATININE 1.14*   < > 1.45* 2.02* 2.04* 1.51*  CALCIUM 8.1*   < > 8.3* 8.0* 7.7* 7.9*  MG 1.6*  --  1.8 1.9  --   --    < > = values in this interval not displayed.   Recent Labs    10/11/21 1815  AST 14*  ALT 8  ALKPHOS 81  BILITOT 0.4  PROT 5.8*  ALBUMIN 3.4*   Recent Labs    10/21/21 0156 10/22/21 0309 10/23/21 0551  WBC 24.1* 16.7* 11.4*  NEUTROABS 20.2* 13.1* 8.2*  HGB 10.0* 10.0* 10.7*  HCT 30.3* 30.5* 32.3*  MCV 96.2 95.9 95.3  PLT 235 232 263   Lab Results  Component Value Date   TSH 3.277 08/11/2018   Lab Results  Component Value Date   HGBA1C 6.5 (H) 10/12/2021   Lab Results  Component Value Date   CHOL 149 08/12/2018   HDL 34 (L) 08/12/2018   LDLCALC 81 08/12/2018   TRIG 171 (H) 08/12/2018   CHOLHDL 4.4 08/12/2018    Significant Diagnostic Results in last 30 days:  DG Pelvis 1-2 Views  Result Date: 10/11/2021 CLINICAL DATA:  Status post fall.  Pain EXAM: PELVIS -  1-2 VIEW COMPARISON:  None. FINDINGS: Markedly limited evaluation due to overlapping osseous structures and overlying soft tissues. There is no evidence of pelvic fracture or diastasis. No pelvic bone lesions are seen. Severe degenerative changes left hip. Degenerative changes of the sacroiliac joints. No hip dislocation. Vascular calcification. IMPRESSION: 1. Markedly limited evaluation due to overlapping osseous structures and overlying soft tissues. 2. No definite acute displaced fracture, hip dislocation, or pelvic diastasis. Electronically Signed   By: Iven Finn M.D.   On: 10/11/2021 21:00   DG Ankle Complete Left  Addendum Date: 10/11/2021   ADDENDUM REPORT: 10/11/2021 20:56 ADDENDUM: Findings should state no evidence of "severe" arthropathy or other focal bone abnormality. Findings should also mention diffuse decreased bone density. Electronically Signed   By: Iven Finn M.D.   On: 10/11/2021 20:56   Result Date: 10/11/2021 CLINICAL DATA:  fall pain EXAM: LEFT ANKLE COMPLETE - 3+ VIEW COMPARISON:  X-ray left tibia fibula 03/02/2005 FINDINGS: Age-indeterminate nondisplaced distal fibular fracture. There is no evidence of definite acute displaced fracture, dislocation, or joint effusion. Degenerative changes of the navicular noted. Plantar calcaneal spur. There is no evidence of arthropathy or other focal bone abnormality. Mild lateral ankle subcutaneus soft tissue edema. Vascular calcifications. IMPRESSION: indeterminate nondisplaced distal fibular fracture. Associated lateral ankle subcutaneus soft tissue edema. Correlate with point tenderness to evaluate for acuity. Electronically Signed: By: Iven Finn M.D. On: 10/11/2021 20:53   DG Ankle Complete Right  Result Date: 10/11/2021 CLINICAL DATA:  Status fall EXAM: RIGHT ANKLE - COMPLETE 3+ VIEW COMPARISON:  None. FINDINGS: Diffusely decreased bone density. Acute inferiorly displaced medial malleolar fracture. Acute displaced distal  fibular fracture. No  definite posterior malleolar fracture. Vague cortical irregularity the medial talus with no definite acute displaced talar fracture. Degenerative changes of the midfoot. Plantar calcaneal spur. There is no evidence of severe arthropathy or other focal bone abnormality. Subcutaneus soft tissue edema of the ankle. Vascular clips overlie the soft tissues of the right distal leg. Vascular calcifications. IMPRESSION: 1. Bimalleolar acute displaced fracture. 2. Vague cortical irregularity the medial talus with no definite acute displaced talar fracture. 3. Diffusely decreased bone density. Electronically Signed   By: Iven Finn M.D.   On: 10/11/2021 20:57   CT ANKLE RIGHT WO CONTRAST  Result Date: 10/12/2021 CLINICAL DATA:  Fracture, ankle EXAM: CT OF THE RIGHT ANKLE WITHOUT CONTRAST TECHNIQUE: Multidetector CT imaging of the right ankle was performed according to the standard protocol. Multiplanar CT image reconstructions were also generated. COMPARISON:  Ankle radiograph 10/11/2021 FINDINGS: Bones/Joint/Cartilage There is a trimalleolar ankle fracture. Oblique distal fibular fracture has minimal 2 mm posterolateral displacement. Medial malleolar fracture has minimal 1-2 mm placement. Posterior malleolar fracture has proximally 1 mm posterior displacement and involves approximately 15% of the articular surface. Tiny fracture of the anterolateral distal tibia consistent with anterior inferior tibiofibular ligament avulsion (series 6, image 42). There is a splint in place. The ankle mortise appears well aligned. Osteopenia. There is mild posterior subtalar, talonavicular, calcaneocuboid, navicular-cuneiform and diffuse tarsometatarsal joint osteoarthritis. Ligaments Suboptimally assessed by CT. Muscles and Tendons Generalized muscle atrophy.  No evidence of tendon entrapment. Soft tissues Extensive soft tissue swelling. IMPRESSION: Trimalleolar ankle fracture with minimal 1-2 mm displacement.  The posterior malleolar fracture involves approximately 15% of the articular surface. Tiny additional fracture of the anterolateral distal tibia consistent with an anterior inferior tibiofibular ligament avulsion. Diffuse osteopenia. Generalized muscle atrophy. Mild hindfoot and midfoot osteoarthritis. Extensive soft tissue swelling. Electronically Signed   By: Maurine Simmering M.D.   On: 10/12/2021 08:59   US RENAL  Result Date: 10/21/2021 CLINICAL DATA:  AK I EXAM: RENAL / URINARY TRACT ULTRASOUND COMPLETE COMPARISON:  CT abdomen pelvis 01/15/2018 FINDINGS: Right Kidney: Renal measurements: 9.9 x 4.5 x 4.3 cm = volume: 100 mL. Echogenicity within normal limits. There is a cyst measuring 1.3 cm. No solid mass. No hydronephrosis. Left Kidney: Renal measurements: 10.2 x 5.3 x 5.6 cm = volume: 160 mL. Echogenicity within normal limits. No mass or hydronephrosis visualized. Bladder: Appears normal for degree of bladder distention. Other: None. IMPRESSION: No acute finding in the bilateral kidneys. Electronically Signed   By: Audie Pinto M.D.   On: 10/21/2021 15:11   CT ANKLE LEFT WO CONTRAST  Result Date: 10/15/2021 CLINICAL DATA:  Fracture, ankle EXAM: CT OF THE LEFT ANKLE WITHOUT CONTRAST TECHNIQUE: Multidetector CT imaging of the left ankle was performed according to the standard protocol. Multiplanar CT image reconstructions were also generated. COMPARISON:  Radiograph 10/11/2021 FINDINGS: Bones/Joint/Cartilage There is a minimally displaced distal fibular fracture, with 2 mm posterior displacement. There is an anterolateral distal tibia fracture consistent with an anterior inferior tibiofibular ligament avulsion. Diffuse osteopenia. No other additional fracture. There is no significant tibiotalar osteoarthritis. There is mild talonavicular and calcaneocuboid osteoarthritis. Ligaments Suboptimally assessed by CT. Muscles and Tendons And no evidence of tendon entrapment.  Generalized muscle atrophy. Soft  tissues Lateral ankle soft tissue swelling. IMPRESSION: Minimally displaced distal fibula fracture. Anterior inferior tibiofibular ligament avulsion fracture of the distal tibia. Electronically Signed   By: Maurine Simmering M.D.   On: 10/15/2021 17:03   MR Hip Left w/o contrast  Result Date: 10/05/2021  CLINICAL DATA:  Left hip pain EXAM: MR OF THE LEFT HIP WITHOUT CONTRAST TECHNIQUE: Multiplanar, multisequence MR imaging was performed. No intravenous contrast was administered. COMPARISON:  Hip radiograph 09/20/2021 FINDINGS: Bones: There is severe left hip arthritis with subchondral fracture of the femoral head, articular surface flattening, and periarticular bony edema and subchondral cystic change. No focal bone lesion. The visualized sacroiliac joints and symphysis pubis appear normal. Lower lumbar spine degenerative disc disease, partially visualized. Articular cartilage and labrum Articular cartilage: Severe chondrosis with essentially full-thickness cartilage loss. Labrum: Diffuse labral degeneration and tearing. Joint or bursal effusion Joint effusion: Trace joint effusion. Bursae: No evidence of trochanteric bursitis. Muscles and tendons Muscles and tendons: The gluteal tendons are intact. The proximal hamstrings are intact.Reactive edema within the left hip adductors. There is left greater than right gluteal muscle atrophy. Other findings Miscellaneous: Prior hysterectomy. IMPRESSION: Severe left hip osteoarthritis with subchondral fracture of the femoral head and articular surface flattening/partial collapse. Diffuse labral degeneration and tearing. Electronically Signed   By: Maurine Simmering M.D.   On: 10/05/2021 14:46   DG CHEST PORT 1 VIEW  Result Date: 10/20/2021 CLINICAL DATA:  Leukocytosis, constipation EXAM: PORTABLE CHEST 1 VIEW COMPARISON:  Chest radiograph 01/20/2020 FINDINGS: Median sternotomy wires and mediastinal surgical clips are again seen. The heart is at the upper limits of normal for  size. There is calcified atherosclerotic plaque of the aortic arch. The mediastinal contours are normal. Linear opacities in the left mid lung and base likely reflect atelectasis or scar. There is no focal consolidation. There is no pulmonary edema. There is no pleural effusion or pneumothorax. There is degenerative change of the right shoulder. There is no acute osseous abnormality. IMPRESSION: 1. Left basilar atelectasis or scar. No radiographic evidence of acute cardiopulmonary process. 2. Unchanged borderline cardiomegaly. Electronically Signed   By: Valetta Mole M.D.   On: 10/20/2021 10:04   DG Ankle Right Port  Result Date: 10/11/2021 CLINICAL DATA:  Post reduction. EXAM: PORTABLE RIGHT ANKLE - 2 VIEW COMPARISON:  10/11/2021. FINDINGS: Examination is limited due to overlying casting material. There is diffusely decreased mineralization of the bones. There is redemonstration of an oblique fracture of the distal fibular shaft and medial malleolus and possible fracture of the posterior tibia. Alignment is unchanged and there is no dislocation. A moderate calcaneal spurs present. Soft tissue swelling is noted about the ankle. IMPRESSION: Fractures of the distal fibula and medial malleolus, and possible fracture of the posterior tibia. Alignment is unchanged. Electronically Signed   By: Brett Fairy M.D.   On: 10/11/2021 23:47   DG Abd Portable 1V  Result Date: 10/20/2021 CLINICAL DATA:  Leukocytosis, constipation EXAM: PORTABLE ABDOMEN - 1 VIEW COMPARISON:  None. FINDINGS: There is a nonobstructive bowel gas pattern. There is no abnormally large colonic stool burden. There is no definite free intraperitoneal air, suboptimally evaluated given supine technique. Right upper quadrant and midline upper abdominal surgical clips are noted. There is no gross organomegaly or abnormal soft tissue calcification. There is multilevel degenerative change of the lumbar spine and marked degenerative change of the left  hip. IMPRESSION: Nonobstructive bowel gas pattern. No abnormally large colonic stool burden. Electronically Signed   By: Valetta Mole M.D.   On: 10/20/2021 10:05   XR C-ARM NO REPORT  Result Date: 09/27/2021 Please see Notes tab for imaging impression.   Assessment/Plan    Family/ staff Communication:   Labs/tests ordered:    Goals of care:      Monina  Medina-Vargas, DNP, MSN, FNP-BC [facility] [phone] (Monday-Friday 8:00 a.m. - 5:00 p.m.) [phone] (after hours)

## 2021-10-24 NOTE — ED Notes (Signed)
Placed a external cath cleaned patient up and changed sheets place a brief patient is resting with family at bedside and call bell in reach

## 2021-10-24 NOTE — ED Notes (Signed)
Pt provided peri-care and new brief

## 2021-10-24 NOTE — ED Provider Notes (Addendum)
[facility] EMERGENCY DEPARTMENT Provider Note   CSN: [id number] Arrival date & time: 10/24/21  4696     History Chief Complaint  Patient presents with   Cough   Chest Pain    Sierra Wright is a 87 y.o. female.  HPI Patient is in [facility] for rehabilitation.  She broke both ankles 4 days ago when she was out with her daughter going through a doctor's appointment.  Patient daughter reports for the past 2 days she has noticed that her mom is having increased difficulty breathing.  Today she had increasing cough and looked more short of breath.  She could also hear her wheezing.  She was concerned and called EMS for transfer to the emergency department.  Per EMS report the patient's oxygen saturation was 88 to 89% on room air at the facility.  Reportedly, patient is not on any usual home oxygen.  On 2 L the patient's oxygen saturation increased to 97%.  Patient where she does have some chest pain.  Pressure quality in the front of her chest.  From patient's perspective her worst symptom is pain that she is having in her feet and ankles.  She has splints in place from her emergency department visit.  She reports she is getting pain medication but she continues to be in severe pain.  Patient also is upset because she was given a laxative and reports she is having stool incontinence now.  No vomiting.    Past Medical History:  Diagnosis Date   Arthritis    Coronary artery disease 1999   CABG   Depression    Diabetes mellitus without complication (HCC)    type II; metformin and Amaryl   Diabetic neuropathy (HCC)    History of anemia as a child    HOH (hard of hearing)    Hypertension    Hypothyroidism    synthroid   PONV (postoperative nausea and vomiting)    Restless leg syndrome    S/P cardiac cath 08/13/18 stable CAD 08/14/2018   Shortness of breath 09/03/12   ECHO- The LA is Moderately Dilated, mild mitral annular calcification. mild pulmonary  hypertension, moderate concentric LV hypertrophy. Atrial septum is aneurysmal. Aortic valve appears to be mildly sclerotic.EF 88%   Urinary incontinence    Weakness of both legs     Patient Active Problem List   Diagnosis Date Noted   Leukocytosis 10/20/2021   Acute lower UTI 10/20/2021   Nausea    PVD (peripheral vascular disease) (HCC)    Ankle fracture 10/12/2021   AKI (acute kidney injury) (HCC) 10/12/2021   Anemia 10/12/2021   Diabetic neuropathy (HCC) 01/25/2021   Diabetic retinopathy (HCC) 01/25/2021   Hyperglycemia due to type 2 diabetes mellitus (HCC) 01/25/2021   Unspecified mononeuropathy of right lower limb 01/25/2021   S/P cardiac cath 08/13/18 stable CAD 08/14/2018   Orthopnea 08/12/2018   Chest pain 09/01/2016   Diabetic polyneuropathy associated with diabetes mellitus due to underlying condition (HCC) 07/26/2016   Chronic daily headache 07/26/2016   RLS (restless legs syndrome) 07/26/2016   Ulnar neuropathy of right upper extremity 07/26/2016   Ischemic stroke (HCC) 07/26/2016   CVA (cerebral infarction): Early subacute 07/14/2016   Back pain 07/14/2016   Essential hypertension    2nd degree AV block    Thyroid activity decreased    Coronary artery disease involving coronary bypass graft of native heart without angina pectoris    Binocular vision disorder with diplopia 07/13/2016  Weakness 07/13/2016   Chronic diastolic CHF (congestive heart failure) ([place]) 04/16/2016   Osteoporosis 07/27/2015    Class: Chronic   Spinal stenosis, lumbar region, with neurogenic claudication 07/26/2015    Class: Chronic   Spondylolisthesis of lumbar region 07/26/2015    Class: Chronic   Hx of CABG 03/15/2014   Osteoarthritis 03/15/2014   DM type 2 goal A1C below 7.5 03/15/2014   Peripheral edema 03/15/2014   Benign hypertensive heart disease without heart failure 03/15/2014   Right bundle branch block 03/15/2014   First degree AV block 03/15/2014   Sinus bradycardia by  electrocardiogram 03/15/2014   Dizziness 03/15/2014    Past Surgical History:  Procedure Laterality Date   ABDOMINAL HYSTERECTOMY     APPENDECTOMY     BACK SURGERY     CARDIAC CATHERIZATION  08/20/03   Widely patent bypass grafts. Normal left ventricular systolic function.   CARDIAC CATHETERIZATION     CHOLECYSTECTOMY     COLONOSCOPY     CORONARY ARTERY BYPASS GRAFT     EYE SURGERY     bilateral cataracts   LEFT HEART CATH AND CORONARY ANGIOGRAPHY N/A 08/13/2018   Procedure: LEFT HEART CATH AND CORONARY ANGIOGRAPHY;  Surgeon: Burnell Blanks, MD;  Location: [facility];  Service: Cardiovascular;  Laterality: N/A;   LUMBAR LAMINECTOMY/DECOMPRESSION MICRODISCECTOMY N/A 07/26/2015   Procedure: BILATERAL Lumbar 3-4 SUBTOTAL HEMILAMINECTOMY WITH LATERAL RECESS DECOMPRESSION;  Surgeon: Jessy Oto, MD;  Location: [facility];  Service: Orthopedics;  Laterality: N/A;   SHOULDER SURGERY     TUBAL LIGATION       OB History   No obstetric history on file.     Family History  Problem Relation Age of Onset   Other Mother    Pneumonia Mother    Heart attack Father    Liver cancer Sister    Stroke Sister    Heart attack Brother 55   Stroke Son 26   Healthy Daughter     Social History   Tobacco Use   Smoking status: Never    Passive exposure: Never   Smokeless tobacco: Never  Vaping Use   Vaping Use: Never used  Substance Use Topics   Alcohol use: No   Drug use: No    Home Medications Prior to Admission medications   Medication Sig Start Date End Date Taking? Authorizing Provider  amLODipine (NORVASC) 5 MG tablet Take 1 tablet by mouth once daily 04/11/21   [name], [name], MD  apixaban (ELIQUIS) 5 MG TABS tablet Take 1 tablet (5 mg total) by mouth 2 (two) times daily. 10/18/21   Domenic Polite, MD  Ascorbic Acid (VITAMIN C) 1000 MG tablet Take 1,000 mg by mouth daily.    [provider]  BD PEN NEEDLE NANO 2ND GEN 32G X 4 MM MISC daily. use as  directed 01/17/21   [provider]  benazepril (LOTENSIN) 10 MG tablet Take 1 tablet (10 mg total) by mouth daily. 10/18/21   Domenic Polite, MD  calcium carbonate (OS-CAL - DOSED IN MG OF ELEMENTAL CALCIUM) 1250 (500 Ca) MG tablet Take 1 tablet by mouth daily.    [provider]  carbidopa-levodopa (SINEMET IR) 25-100 MG tablet Take 1.5 tablets by mouth at bedtime. 12/16/20   Patel, Arvin Collard K, DO  cephALEXin (KEFLEX) 500 MG capsule Take 1 capsule (500 mg total) by mouth 2 (two) times daily for 3 days. 10/23/21 10/26/21  Pokhrel, Corrie Mckusick, MD  cholecalciferol (VITAMIN D3) 25 MCG (1000 UNIT)  tablet Take 1,000 Units by mouth daily.    [provider]  furosemide (LASIX) 40 MG tablet Take 1 tablet (40 mg total) by mouth daily. 10/18/21   Domenic Polite, MD  hydrALAZINE (APRESOLINE) 25 MG tablet TAKE 1 TABLET BY MOUTH THREE TIMES DAILY 11/22/20   [name], [name], MD  Insulin Pen Needle (BD PEN NEEDLE NANO 2ND GEN) 32G X 4 MM MISC See admin instructions.    [provider]  isosorbide mononitrate (IMDUR) 30 MG 24 hr tablet Take 1 tablet by mouth once daily 07/10/21   [name], [name], MD  Lancets [facility] DELICA PLUS [id number]) MISC Apply topically 2 (two) times daily. 01/17/21   [provider]  LANTUS SOLOSTAR 100 UNIT/ML Solostar Pen Inject 20 Units into the skin daily. 10/18/21   Domenic Polite, MD  levothyroxine (SYNTHROID, LEVOTHROID) 100 MCG tablet Take 100 mcg by mouth daily before breakfast.    [provider]  methocarbamol (ROBAXIN) 500 MG tablet Take 500 mg by mouth every 8 (eight) hours as needed for muscle spasms. X 3 days and on 10/27/21 change to every 12 hours as needed for spasms x 5 days    [provider]  metoprolol succinate (TOPROL-XL) 25 MG 24 hr tablet Take 1 tablet by mouth once daily 06/07/21   [name], [name], MD  nitroGLYCERIN (NITROSTAT) 0.4 MG SL tablet DISSOLVE ONE TABLET UNDER THE TONGUE EVERY 5 MINUTES AS NEEDED  FOR CHEST PAIN. 04/19/21   [name], [name], MD  ondansetron (ZOFRAN) 4 MG tablet Take 4 mg by mouth every 6 (six) hours as needed for nausea. 08/20/20   [provider]  Roma Schanz test strip SMARTSIG:Strip(s) Via Meter Twice Daily 01/17/21   [provider]  oxyCODONE (ROXICODONE) 5 MG immediate release tablet Take 1-2 tablets (5-10 mg total) by mouth every 6 (six) hours as needed for severe pain. 10/18/21   Domenic Polite, MD  polyethylene glycol (MIRALAX / GLYCOLAX) 17 g packet Take 17 g by mouth daily. 10/18/21   Domenic Polite, MD  pregabalin (LYRICA) 50 MG capsule Take 50 mg by mouth 2 (two) times daily.    [provider]  senna-docusate (SENOKOT-S) 8.6-50 MG tablet Take 1 tablet by mouth 2 (two) times daily. 10/18/21   Domenic Polite, MD  vitamin B-12 (CYANOCOBALAMIN) 1000 MCG tablet Take 1,000 mcg by mouth daily.    [provider]    Allergies    Duloxetine hcl, Statins, Amitriptyline, Benadryl [diphenhydramine hcl (sleep)], Ezetimibe, Metformin hcl, Other, Sitagliptin, Tylenol [acetaminophen], and Colesevelam hcl  Review of Systems   Review of Systems 10 systems reviewed and negative except as per HPI Physical Exam Updated Vital Signs BP (!) 160/74   Pulse (!) 58   Temp 98.5 F (36.9 C) (Oral)   Resp (!) 22   Ht 5\' 6"  (1.676 m)   Wt 105.3 kg   SpO2 96%   BMI 37.47 kg/m   Physical Exam Constitutional:      Comments: Patient is alert.  Mild increased work of breathing at rest.  Deconditioning and central obesity.  Non toxic.  HENT:     Head: Normocephalic and atraumatic.     Mouth/Throat:     Pharynx: Oropharynx is clear.  Eyes:     Extraocular Movements: Extraocular movements intact.  Cardiovascular:     Comments: Irregularly irregular.  No significant appreciable rub murmur gallop.  Some ambient noise present. Pulmonary:     Comments: Mild increased work of breathing at rest.  With deep inspiration patient has coughing.   Breath sounds somewhat decreased at the bases.  Occasional wheeze. Abdominal:     General: There is no distension.     Palpations: Abdomen is soft.     Tenderness: There is no abdominal tenderness. There is no guarding.  Musculoskeletal:     Cervical back: Neck supple.     Comments: Patient has Ortho-Glass splints on both lower extremities.  Toes are warm and dry on both feet.  Right foot has some puffy edema of the dorsum greater than the left.  Skin:    General: Skin is warm and dry.  Neurological:     Comments: Patient is alert and answering questions appropriately.  No focal motor deficits.  Generally physically deconditioned    ED Results / Procedures / Treatments   Labs (all labs ordered are listed, but only abnormal results are displayed) Labs Reviewed  COMPREHENSIVE METABOLIC PANEL - Abnormal; Notable for the following components:      Result Value   CO2 19 (*)    Glucose, Bld 190 (*)    BUN 35 (*)    Creatinine, Ser 1.16 (*)    Calcium 8.3 (*)    Total Protein 4.9 (*)    Albumin 2.2 (*)    GFR, Estimated 46 (*)    All other components within normal limits  BRAIN NATRIURETIC PEPTIDE - Abnormal; Notable for the following components:   B Natriuretic Peptide 291.1 (*)    All other components within normal limits  CBC WITH DIFFERENTIAL/PLATELET - Abnormal; Notable for the following components:   Abs Immature Granulocytes 0.27 (*)    All other components within normal limits  PROTIME-INR - Abnormal; Notable for the following components:   Prothrombin Time 15.7 (*)    INR 1.3 (*)    All other components within normal limits  I-STAT VENOUS BLOOD GAS, ED - Abnormal; Notable for the following components:   pH, Ven 7.456 (*)    pCO2, Ven 32.2 (*)    pO2, Ven 161.0 (*)    Calcium, Ion 1.13 (*)    HCT 30.0 (*)    Hemoglobin 10.2 (*)    All other components within normal limits  TROPONIN I (HIGH SENSITIVITY) - Abnormal; Notable for the following components:   Troponin I  (High Sensitivity) 31 (*)    All other components within normal limits  TROPONIN I (HIGH SENSITIVITY) - Abnormal; Notable for the following components:   Troponin I (High Sensitivity) 33 (*)    All other components within normal limits  LACTIC ACID, PLASMA  LACTIC ACID, PLASMA  URINALYSIS, ROUTINE W REFLEX MICROSCOPIC    EKG None  Radiology DG Chest 2 View  Result Date: 10/24/2021 CLINICAL DATA:  Cough and chest pain beginning this morning. Shortness of breath. EXAM: CHEST - 2 VIEW COMPARISON:  10/20/2021 FINDINGS: Stable cardiomegaly.  Prior CABG. Increased diffuse interstitial prominence, suspicious for interstitial edema. No evidence of pulmonary consolidation or pleural effusion. IMPRESSION: Increased diffuse interstitial prominence, suspicious for interstitial edema. Stable cardiomegaly. Electronically Signed   By: Marlaine Hind M.D.   On: 10/24/2021 11:04   DG Ankle Complete Left  Result Date: 10/24/2021 CLINICAL DATA:  Bilateral ankle pain after fractures 3 weeks ago. EXAM: LEFT ANKLE COMPLETE - 3+ VIEW COMPARISON:  October 11, 2021. FINDINGS: Left ankle has been casted and immobilized. Distal left fibular fracture is not well visualized due to overlying cast. No new fracture or dislocation is noted. IMPRESSION: Status post casting and immobilization  of distal left fibular fracture. Electronically Signed   By: Marijo Conception M.D.   On: 10/24/2021 12:17   DG Ankle Complete Right  Result Date: 10/24/2021 CLINICAL DATA:  Bilateral ankle pain after fractures 3 weeks ago. EXAM: RIGHT ANKLE - COMPLETE 3+ VIEW COMPARISON:  October 11, 2021. FINDINGS: Right ankle has been casted and immobilized. Minimally displaced distal fibular and medial malleolar fractures are again noted. No new fracture is noted. IMPRESSION: Status post casting and immobilization of distal right fibular and tibial fractures. Electronically Signed   By: Marijo Conception M.D.   On: 10/24/2021 12:19     Procedures .Ortho Injury Treatment  Date/Time: 10/24/2021 3:23 PM Performed by: Charlesetta Shanks, MD Authorized by: Charlesetta Shanks, MD   Consent:    Consent obtained:  Verbal   Consent given by:  PatientInjury location: ankle Location details: right ankle Injury type: fracture Fracture type: trimalleolar Pre-procedure neurovascular assessment: neurovascularly intact Pre-procedure distal perfusion: normal Pre-procedure neurological function: normal Pre-procedure range of motion: reduced  Anesthesia: Local anesthesia used: no  Patient sedated: NoManipulation performed: no Immobilization: splint Splint Applied by: ED Provider and Ortho Tech Supplies used: cotton padding, elastic bandage and Ortho-Glass Post-procedure neurovascular assessment: post-procedure neurovascularly intact Post-procedure distal perfusion: normal Post-procedure neurological function: normal Post-procedure range of motion: unchanged     Medications Ordered in ED Medications  HYDROmorphone (DILAUDID) injection 0.5 mg (has no administration in time range)  AeroChamber Plus Flo-Vu Large MISC 1 each (has no administration in time range)  morphine 4 MG/ML injection 4 mg (4 mg Intravenous Given 10/24/21 1041)  albuterol (VENTOLIN HFA) 108 (90 Base) MCG/ACT inhaler 2 puff (2 puffs Inhalation Given 10/24/21 1144)  HYDROmorphone (DILAUDID) injection 0.5 mg (0.5 mg Intravenous Given 10/24/21 1145)    ED Course  I have reviewed the triage vital signs and the nursing notes.  Pertinent labs & imaging results that were available during my care of the patient were reviewed by me and considered in my medical decision making (see chart for details).  Clinical Course as of 10/24/21 1524  Tue Oct 24, 2021  1334 Consult: Reviewed with Dr. Tempie Donning orthopedics.  Advises patient's management is plan for nonoperative.  Can remove splints and examined skin surfaces but at this time would not plan for a change in  management strategy. [MP]    Clinical Course User Index [MP] Charlesetta Shanks, MD   MDM Rules/Calculators/A&P                           Patient's daughter reports at baseline patient was able to transfer out of her wheelchair to bedside to commode to kitchen.  This was her baseline function.  She unfortunately had a fall when going to see her physician 4 days ago in the parking lot, resulting in bilateral ankle fractures.  After discharge patient was transferred to rehab facility at [facility] patient daughter reports that today she was concerned because the patient seemed to have increased difficulty breathing and was wheezing.  At baseline she does not wear oxygen.  EMS reports oxygen saturation 88% on room air.  We will proceed with diagnostic evaluation for cardiopulmonary etiology.  We will provide albuterol inhaler for wheezing and morphine for pain.  Patient's main concern is pain in her ankles and feet and loose stool with report of recent use of a laxative.  Patient's main complaint was pain in her ankles.  She was not able to really localize pain  she felt that both of them were very painful.  She did not localize pain to one side of the leg or foot.  Splints were removed and the legs examined.  Before removing the splint, the toes are warm and dry.  No signs of hypoperfusion.  The initial splint was very well applied with good placement. once splint was removed from the right lower extremity which has a trimalleolar fracture, skin condition is good.  There were no areas of skin redness or signs of pressure injury.  Mild swelling around the ankle and the foot but not extreme.  no large amount of ecchymosis.  Patient was comfortable with the splint off.  She was not exhibiting any signs of severe pain.  Splint was replaced and again she did not show signs of significant pain once new splint was in place.  She did request the left splint also be replaced by [employer].  Left side as well hand warm and  well-perfused foot.  At this time no sign of complications due to splinting.  We will proceed as planned after discussing with consultation with orthopedics for splinting and pain control with elevation and delayed follow-up with orthopedics for anticipated nonoperative management  Guarding patient shortness of breath, symptoms are much better after 2 albuterol inhaler puffs.  On recheck she is not showing any wheezing.  Patient was taken off of oxygen and maintained oxygen saturation at 96% with good waveform.  Objectively not dyspneic in appearance.  She did have coughing with inspiration.  No signs of pneumonia on chest x-ray.  Clinically patient is not exhibiting pneumonia.  As symptoms improved significantly went to the emergency department and with an albuterol inhaler, I suspect there is some degree of atelectasis likely secondary to poor mobility.  Will dispense incentive spirometer and albuterol inhaler.  Questionable mild vascular congestion on chest x-ray.  Will recommend a couple of days of doubling the patient's Lasix dose.  At this time she is not showing any signs of acute congestive heart failure.  Stable for expeditious outpatient follow-up.  Patient's daughter wanted a change in rehab facility.  At this time she does not feel that [facility] is meeting her mother's needs.  She is counseled that social work at the facility can help to facilitate transfer from one facility to another..  Social work will pass by and help her understand process for facility change based on personal inclination. Final Clinical Impression(s) / ED Diagnoses Final diagnoses:  Closed fracture of ankle with routine healing, unspecified laterality, subsequent encounter  Shortness of breath  Wheeze    Rx / DC Orders ED Discharge Orders     None        Charlesetta Shanks, MD 10/24/21 1530    Charlesetta Shanks, MD 10/24/21 [phone]

## 2021-10-24 NOTE — Progress Notes (Signed)
Orthopedic Tech Progress Note Patient Details:  Sierra Wright 1934-09-21 [id number]  MD PFEIFFER removed BLE SPLINTS and I was given a verbal order to reapply the same splints to patient with DAUGHTER at bedside    Ortho Devices Type of Ortho Device: Stirrup splint, Short leg splint Ortho Device/Splint Location: BLE Ortho Device/Splint Interventions: Ordered, Application, Adjustment, Removal   Post Interventions Patient Tolerated: Well Instructions Provided: Care of device  [name] 10/24/2021, 3:19 PM

## 2021-10-24 NOTE — ED Notes (Signed)
Got patient some apple juice patient is resting with family at bedside

## 2021-10-25 ENCOUNTER — Encounter: Payer: Self-pay | Admitting: Adult Health

## 2021-10-25 ENCOUNTER — Non-Acute Institutional Stay (SKILLED_NURSING_FACILITY): Payer: Medicare Other | Admitting: Adult Health

## 2021-10-25 DIAGNOSIS — Z794 Long term (current) use of insulin: Secondary | ICD-10-CM

## 2021-10-25 DIAGNOSIS — S82832S Other fracture of upper and lower end of left fibula, sequela: Secondary | ICD-10-CM | POA: Diagnosis not present

## 2021-10-25 DIAGNOSIS — N390 Urinary tract infection, site not specified: Secondary | ICD-10-CM | POA: Diagnosis not present

## 2021-10-25 DIAGNOSIS — I5032 Chronic diastolic (congestive) heart failure: Secondary | ICD-10-CM | POA: Diagnosis not present

## 2021-10-25 DIAGNOSIS — Z8673 Personal history of transient ischemic attack (TIA), and cerebral infarction without residual deficits: Secondary | ICD-10-CM

## 2021-10-25 DIAGNOSIS — N179 Acute kidney failure, unspecified: Secondary | ICD-10-CM

## 2021-10-25 DIAGNOSIS — I480 Paroxysmal atrial fibrillation: Secondary | ICD-10-CM

## 2021-10-25 DIAGNOSIS — R197 Diarrhea, unspecified: Secondary | ICD-10-CM | POA: Diagnosis not present

## 2021-10-25 DIAGNOSIS — I10 Essential (primary) hypertension: Secondary | ICD-10-CM | POA: Diagnosis not present

## 2021-10-25 DIAGNOSIS — E1140 Type 2 diabetes mellitus with diabetic neuropathy, unspecified: Secondary | ICD-10-CM | POA: Diagnosis not present

## 2021-10-25 DIAGNOSIS — S82841S Displaced bimalleolar fracture of right lower leg, sequela: Secondary | ICD-10-CM

## 2021-10-25 DIAGNOSIS — G2581 Restless legs syndrome: Secondary | ICD-10-CM | POA: Diagnosis not present

## 2021-10-25 LAB — CULTURE, BLOOD (ROUTINE X 2)
Culture: NO GROWTH
Culture: NO GROWTH

## 2021-10-25 MED ORDER — OXYCODONE HCL 5 MG PO TABS: 5.0000 mg | ORAL_TABLET | Freq: Three times a day (TID) | ORAL | 0 refills | Status: DC | PRN

## 2021-10-25 NOTE — Progress Notes (Signed)
Location:  [facility] Room Number: 306 Place of Service:  SNF (31) Provider:  Kenard GowerMedina-Vargas, Berdina Cheever, DNP, FNP-BC  Patient Care Team: Mila PalmerWolters, Sharon, MD as PCP - General (Family Medicine) [name], Deloris PingPhilip J, MD as PCP - Cardiology (Cardiology) Glendale ChardPatel, Donika K, DO as Consulting Physician (Neurology)  Extended Emergency Contact Information Primary Emergency Contact: Kai LevinsKendrick,Robert D Address: [address]          [place], [place] [address] [place] Home Phone: [phone] Relation: Spouse Secondary Emergency Contact: DENNIS,SHERRIE Mobile Phone: [phone] Relation: Daughter  Code Status:  FULL CODE  Goals of care: Advanced Directive information Advanced Directives 10/25/2021  Does Patient Have a Medical Advance Directive? No  Type of Advance Directive -  Would patient like information on creating a medical advance directive? -     Chief Complaint  Patient presents with   Hospitalization Follow-up    Hospital follow up.    HPI:  Pt is a 87 y.o. female  who was [facility] to [facility] 10[facility]21 [facility] increased difficulty breathing with O2 sat 88% on room air and wheezing per daughter who called EMS. She was given albuterol inhaler for wheezing and morphine for pain. She was having pain in her ankles and feet and loose stool with report of recent uses of a laxative. Toes were warm and dry. Splints were removed and legs examined. Skin was noted to be in good condition and no pressure injury. Mild swelling with no large ecchymosis were noted. Splints were replaced and and she did not show signs of significant pain. Chest x-ray was negative for pneumonia. It was suspected that there is some degree of atelectasis likely secondary to poor mobility  Of note, she was [facility] on 10/[facility]/21 post hospital admission 10/11/21 to 10/23/21.  She has a PMH of coronary artery disease/CABG, chronic diastolic heart failure, type 2 diabetes mellitus, CVA, hypertension, hypothyroidism and osteoarthritis.  She had a mechanical fall while she was transferring from a car to wheelchair in her PCP office.  There was no loss of consciousness.  She was noted to have elevated creatinine at 2.2.  Right ankle x-ray showed bimalleolar acute displaced fracture and left ankle showed indeterminate nondisplaced distal fibular fracture with lateral ankle subcutaneous soft tissue edema.  ED provider attempted a manual reduction but was unsuccessful so orthopedics was consulted.  And was admitted to the hospital for further evaluation and treatment.  Please of surgery was very high in the setting of peripheral vascular disease and severe osteoporosis.  Orthopedics, Dr. Lajoyce Cornersuda, recommended nonweightbearing and nonsurgical management for 3 weeks.  Orthopedics follow-up will be scheduled in 2 weeks.    She was seen in her room today with daughter at bedside. Review of home medications done. Stool softeners were requested to be changed to PRN due to diarrhea.    Past Medical History:  Diagnosis Date   Arthritis    Coronary artery disease 1999   CABG   Depression    Diabetes mellitus without complication (HCC)    type II; metformin and Amaryl   Diabetic neuropathy (HCC)    History of anemia as a child    HOH (hard of hearing)    Hypertension    Hypothyroidism    synthroid   PONV (postoperative nausea and vomiting)    Restless leg syndrome    S/P cardiac cath 08/13/18 stable CAD 08/14/2018  Shortness of breath 09/03/12   ECHO- The LA is Moderately Dilated, mild mitral annular calcification. mild pulmonary hypertension, moderate concentric LV hypertrophy. Atrial septum is aneurysmal. Aortic valve appears to be mildly sclerotic.EF 88%   Urinary incontinence    Weakness of both legs    Past Surgical History:  Procedure Laterality Date   ABDOMINAL HYSTERECTOMY      APPENDECTOMY     BACK SURGERY     CARDIAC CATHERIZATION  08/20/03   Widely patent bypass grafts. Normal left ventricular systolic function.   CARDIAC CATHETERIZATION     CHOLECYSTECTOMY     COLONOSCOPY     CORONARY ARTERY BYPASS GRAFT     EYE SURGERY     bilateral cataracts   LEFT HEART CATH AND CORONARY ANGIOGRAPHY N/A 08/13/2018   Procedure: LEFT HEART CATH AND CORONARY ANGIOGRAPHY;  Surgeon: Burnell Blanks, MD;  Location: [facility];  Service: Cardiovascular;  Laterality: N/A;   LUMBAR LAMINECTOMY/DECOMPRESSION MICRODISCECTOMY N/A 07/26/2015   Procedure: BILATERAL Lumbar 3-4 SUBTOTAL HEMILAMINECTOMY WITH LATERAL RECESS DECOMPRESSION;  Surgeon: Jessy Oto, MD;  Location: [facility];  Service: Orthopedics;  Laterality: N/A;   SHOULDER SURGERY     TUBAL LIGATION      Allergies  Allergen Reactions   Duloxetine Hcl Nausea And Vomiting and Other (See Comments)   Statins Other (See Comments)    Causes myalgias Other reaction(s): Unknown   Amitriptyline Other (See Comments)    Pt. States that it caused it to be suicidal.    Benadryl [Diphenhydramine Hcl (Sleep)] Other (See Comments)    Makes the patient very "hyper"   Ezetimibe     Other reaction(s): Unknown Other reaction(s): Unknown   Metformin Hcl     Other reaction(s): Unknown Other reaction(s): Unknown   Other     Other reaction(s): Unknown   Sitagliptin     Other reaction(s): Unknown Other reaction(s): Unknown   Tylenol [Acetaminophen] Nausea Only   Colesevelam Hcl Other (See Comments)    Causes dizziness Other reaction(s): Unknown    Outpatient Encounter Medications as of 10/25/2021  Medication Sig   amLODipine (NORVASC) 5 MG tablet Take 1 tablet by mouth once daily   apixaban (ELIQUIS) 5 MG TABS tablet Take 1 tablet (5 mg total) by mouth 2 (two) times daily.   Ascorbic Acid (VITAMIN C) 1000 MG tablet Take 1,000 mg by mouth daily.   benazepril (LOTENSIN) 10 MG tablet Take 1 tablet (10 mg total) by  mouth daily.   bisacodyl (DULCOLAX) 10 MG suppository Place 10 mg rectally as needed for moderate constipation.   calcium carbonate (OS-CAL - DOSED IN MG OF ELEMENTAL CALCIUM) 1250 (500 Ca) MG tablet Take 1 tablet by mouth daily.   carbidopa-levodopa (SINEMET IR) 25-100 MG tablet Take 1.5 tablets by mouth at bedtime.   cephALEXin (KEFLEX) 500 MG capsule Take 1 capsule (500 mg total) by mouth 2 (two) times daily for 3 days.   cholecalciferol (VITAMIN D3) 25 MCG (1000 UNIT) tablet Take 1,000 Units by mouth daily.   furosemide (LASIX) 40 MG tablet Take 1 tablet (40 mg total) by mouth daily.   hydrALAZINE (APRESOLINE) 25 MG tablet TAKE 1 TABLET BY MOUTH THREE TIMES DAILY   isosorbide mononitrate (IMDUR) 30 MG 24 hr tablet Take 1 tablet by mouth once daily   LANTUS SOLOSTAR 100 UNIT/ML Solostar Pen Inject 20 Units into the skin daily.   levothyroxine (SYNTHROID, LEVOTHROID) 100 MCG tablet Take 100 mcg by mouth daily before breakfast.   Magnesium  Hydroxide (MILK OF MAGNESIA PO) Take 30 mLs by mouth as needed.   methocarbamol (ROBAXIN) 500 MG tablet Take 500 mg by mouth every 8 (eight) hours as needed for muscle spasms. X 3 days and on 10/27/21 change to every 12 hours as needed for spasms x 5 days   metoprolol succinate (TOPROL-XL) 25 MG 24 hr tablet Take 1 tablet by mouth once daily   nitroGLYCERIN (NITROSTAT) 0.4 MG SL tablet DISSOLVE ONE TABLET UNDER THE TONGUE EVERY 5 MINUTES AS NEEDED FOR CHEST PAIN.   ondansetron (ZOFRAN) 4 MG tablet Take 4 mg by mouth every 6 (six) hours as needed for nausea.   oxyCODONE (ROXICODONE) 5 MG immediate release tablet Take 1-2 tablets (5-10 mg total) by mouth every 6 (six) hours as needed for severe pain. (Patient taking differently: Take 5 mg by mouth every 6 (six) hours as needed for severe pain.)   polyethylene glycol (MIRALAX / GLYCOLAX) 17 g packet Take 17 g by mouth daily.   pregabalin (LYRICA) 50 MG capsule Take 50 mg by mouth 2 (two) times daily.    senna-docusate (SENOKOT-S) 8.6-50 MG tablet Take 1 tablet by mouth 2 (two) times daily.   Sodium Phosphates (RA SALINE ENEMA RE) Place 1 Dose rectally as needed.   vitamin B-12 (CYANOCOBALAMIN) 1000 MCG tablet Take 1,000 mcg by mouth daily.   [DISCONTINUED] BD PEN NEEDLE NANO 2ND GEN 32G X 4 MM MISC daily. use as directed   [DISCONTINUED] Insulin Pen Needle (BD PEN NEEDLE NANO 2ND GEN) 32G X 4 MM MISC See admin instructions.   [DISCONTINUED] Lancets (ONETOUCH DELICA PLUS [id number]) MISC Apply topically 2 (two) times daily.   [DISCONTINUED] ONETOUCH VERIO test strip SMARTSIG:Strip(s) Via Meter Twice Daily   No facility-administered encounter medications on file as of 10/25/2021.    Review of Systems  GENERAL: No change in appetite, no fatigue, no weight changes, no fever or chills  MOUTH and THROAT: Denies oral discomfort, gingival pain or bleeding RESPIRATORY: no cough, SOB, DOE, wheezing, hemoptysis CARDIAC: No chest pain, edema or palpitations GI: +diarrhea GU: Denies dysuria, frequency, hematuria, incontinence, or discharge NEUROLOGICAL: Denies dizziness, syncope, numbness, or headache PSYCHIATRIC: Denies feelings of depression or anxiety. No report of hallucinations, insomnia, paranoia, or agitation   Immunization History  Administered Date(s) Administered   Influenza Split 09/13/2007, 09/02/2009, 09/02/2010, 09/03/2011, 08/19/2012, 09/22/2013, 08/29/2016, 08/14/2018   Influenza, High Dose Seasonal PF 08/14/2018, 09/30/2019   Influenza,inj,Quad PF,6+ Mos 11/09/2020   Influenza,inj,quad, With Preservative 08/05/2014, 10/18/2015   Janssen (J&J) SARS-COV-2 Vaccination 04/13/2020   PFIZER(Purple Top)SARS-COV-2 Vaccination 01/04/2021   Pneumococcal Conjugate-13 09/08/2008, 01/11/2015   Td 07/04/2007   Pertinent  Health Maintenance Due  Topic Date Due   OPHTHALMOLOGY EXAM  Never done   INFLUENZA VACCINE  07/03/2021   FOOT EXAM  01/25/2022   HEMOGLOBIN A1C  04/11/2022   DEXA SCAN   Completed   Fall Risk 10/21/2021 10/21/2021 10/22/2021 10/22/2021 10/24/2021  Falls in the past year? - - - - -  Was there an injury with Fall? - - - - -  Fall Risk Category Calculator - - - - -  Fall Risk Category - - - - -  Patient Fall Risk Level High fall risk High fall risk High fall risk High fall risk High fall risk  Patient at Risk for Falls Due to - - - - -  Fall risk Follow up - - - - -     Vitals:   10/25/21 0924  BP: 136/66  Pulse: Marland Kitchen)  54  Resp: 18  Temp: (!) 96.9 F (36.1 C)  Height: 5\' 6"  (1.676 m)   Body mass index is 37.47 kg/m.  Physical Exam  GENERAL APPEARANCE: Well nourished. In no acute distress. Obese. SKIN:  bruising on LUE (daughter stated that it is from her blood extractions and IV sites) MOUTH and THROAT: Lips are without lesions. Oral mucosa is moist and without lesions.  RESPIRATORY: Breathing is even & unlabored, BS CTAB CARDIAC: RRR, no murmur,no extra heart sounds GI: Abdomen soft, normal BS, no masses, no tenderness EXTREMITIES: Bilateral lower extremities with splint and ACE wraps. NEUROLOGICAL: There is no tremor. Speech is clear. PSYCHIATRIC:  Affect and behavior are appropriate  Labs reviewed: Recent Labs    10/13/21 0324 10/14/21 0339 10/20/21 0216 10/21/21 0156 10/22/21 0309 10/23/21 0551 10/24/21 1045 10/24/21 1111  NA 133*   < > 131* 127* 128* 132* 136 136  K 4.4   < > 4.3 4.8 4.8 4.9 4.7 4.5  CL 106   < > 99 98 99 103 107  --   CO2 20*   < > 25 20* 20* 23 19*  --   GLUCOSE 173*   < > 128* 193* 173* 175* 190*  --   BUN 23   < > 35* 53* 63* 50* 35*  --   CREATININE 1.14*   < > 1.45* 2.02* 2.04* 1.51* 1.16*  --   CALCIUM 8.1*   < > 8.3* 8.0* 7.7* 7.9* 8.3*  --   MG 1.6*  --  1.8 1.9  --   --   --   --    < > = values in this interval not displayed.   Recent Labs    10/11/21 1815 10/24/21 1045  AST 14* 20  ALT 8 20  ALKPHOS 81 89  BILITOT 0.4 0.6  PROT 5.8* 4.9*  ALBUMIN 3.4* 2.2*   Recent Labs     10/22/21 0309 10/23/21 0551 10/24/21 1045 10/24/21 1111  WBC 16.7* 11.4* 7.8  --   NEUTROABS 13.1* 8.2* 5.4  --   HGB 10.0* 10.7* 14.0 10.2*  HCT 30.5* 32.3* 43.3 30.0*  MCV 95.9 95.3 96.4  --   PLT 232 263 226  --    Lab Results  Component Value Date   TSH 3.277 08/11/2018   Lab Results  Component Value Date   HGBA1C 6.5 (H) 10/12/2021   Lab Results  Component Value Date   CHOL 149 08/12/2018   HDL 34 (L) 08/12/2018   LDLCALC 81 08/12/2018   TRIG 171 (H) 08/12/2018   CHOLHDL 4.4 08/12/2018    Significant Diagnostic Results in last 30 days:  DG Chest 2 View  Result Date: 10/24/2021 CLINICAL DATA:  Cough and chest pain beginning this morning. Shortness of breath. EXAM: CHEST - 2 VIEW COMPARISON:  10/20/2021 FINDINGS: Stable cardiomegaly.  Prior CABG. Increased diffuse interstitial prominence, suspicious for interstitial edema. No evidence of pulmonary consolidation or pleural effusion. IMPRESSION: Increased diffuse interstitial prominence, suspicious for interstitial edema. Stable cardiomegaly. Electronically Signed   By: 10/22/2021 M.D.   On: 10/24/2021 11:04   DG Pelvis 1-2 Views  Result Date: 10/11/2021 CLINICAL DATA:  Status post fall.  Pain EXAM: PELVIS - 1-2 VIEW COMPARISON:  None. FINDINGS: Markedly limited evaluation due to overlapping osseous structures and overlying soft tissues. There is no evidence of pelvic fracture or diastasis. No pelvic bone lesions are seen. Severe degenerative changes left hip. Degenerative changes of the sacroiliac joints. No hip  dislocation. Vascular calcification. IMPRESSION: 1. Markedly limited evaluation due to overlapping osseous structures and overlying soft tissues. 2. No definite acute displaced fracture, hip dislocation, or pelvic diastasis. Electronically Signed   By: Iven Finn M.D.   On: 10/11/2021 21:00   DG Ankle Complete Left  Result Date: 10/24/2021 CLINICAL DATA:  Bilateral ankle pain after fractures 3 weeks ago.  EXAM: LEFT ANKLE COMPLETE - 3+ VIEW COMPARISON:  October 11, 2021. FINDINGS: Left ankle has been casted and immobilized. Distal left fibular fracture is not well visualized due to overlying cast. No new fracture or dislocation is noted. IMPRESSION: Status post casting and immobilization of distal left fibular fracture. Electronically Signed   By: Marijo Conception M.D.   On: 10/24/2021 12:17   DG Ankle Complete Left  Addendum Date: 10/11/2021   ADDENDUM REPORT: 10/11/2021 20:56 ADDENDUM: Findings should state no evidence of "severe" arthropathy or other focal bone abnormality. Findings should also mention diffuse decreased bone density. Electronically Signed   By: Iven Finn M.D.   On: 10/11/2021 20:56   Result Date: 10/11/2021 CLINICAL DATA:  fall pain EXAM: LEFT ANKLE COMPLETE - 3+ VIEW COMPARISON:  X-ray left tibia fibula 03/02/2005 FINDINGS: Age-indeterminate nondisplaced distal fibular fracture. There is no evidence of definite acute displaced fracture, dislocation, or joint effusion. Degenerative changes of the navicular noted. Plantar calcaneal spur. There is no evidence of arthropathy or other focal bone abnormality. Mild lateral ankle subcutaneus soft tissue edema. Vascular calcifications. IMPRESSION: indeterminate nondisplaced distal fibular fracture. Associated lateral ankle subcutaneus soft tissue edema. Correlate with point tenderness to evaluate for acuity. Electronically Signed: By: Iven Finn M.D. On: 10/11/2021 20:53   DG Ankle Complete Right  Result Date: 10/24/2021 CLINICAL DATA:  Bilateral ankle pain after fractures 3 weeks ago. EXAM: RIGHT ANKLE - COMPLETE 3+ VIEW COMPARISON:  October 11, 2021. FINDINGS: Right ankle has been casted and immobilized. Minimally displaced distal fibular and medial malleolar fractures are again noted. No new fracture is noted. IMPRESSION: Status post casting and immobilization of distal right fibular and tibial fractures. Electronically Signed    By: Marijo Conception M.D.   On: 10/24/2021 12:19   DG Ankle Complete Right  Result Date: 10/11/2021 CLINICAL DATA:  Status fall EXAM: RIGHT ANKLE - COMPLETE 3+ VIEW COMPARISON:  None. FINDINGS: Diffusely decreased bone density. Acute inferiorly displaced medial malleolar fracture. Acute displaced distal fibular fracture. No definite posterior malleolar fracture. Vague cortical irregularity the medial talus with no definite acute displaced talar fracture. Degenerative changes of the midfoot. Plantar calcaneal spur. There is no evidence of severe arthropathy or other focal bone abnormality. Subcutaneus soft tissue edema of the ankle. Vascular clips overlie the soft tissues of the right distal leg. Vascular calcifications. IMPRESSION: 1. Bimalleolar acute displaced fracture. 2. Vague cortical irregularity the medial talus with no definite acute displaced talar fracture. 3. Diffusely decreased bone density. Electronically Signed   By: Iven Finn M.D.   On: 10/11/2021 20:57   CT ANKLE RIGHT WO CONTRAST  Result Date: 10/12/2021 CLINICAL DATA:  Fracture, ankle EXAM: CT OF THE RIGHT ANKLE WITHOUT CONTRAST TECHNIQUE: Multidetector CT imaging of the right ankle was performed according to the standard protocol. Multiplanar CT image reconstructions were also generated. COMPARISON:  Ankle radiograph 10/11/2021 FINDINGS: Bones/Joint/Cartilage There is a trimalleolar ankle fracture. Oblique distal fibular fracture has minimal 2 mm posterolateral displacement. Medial malleolar fracture has minimal 1-2 mm placement. Posterior malleolar fracture has proximally 1 mm posterior displacement and involves approximately 15% of the  articular surface. Tiny fracture of the anterolateral distal tibia consistent with anterior inferior tibiofibular ligament avulsion (series 6, image 42). There is a splint in place. The ankle mortise appears well aligned. Osteopenia. There is mild posterior subtalar, talonavicular, calcaneocuboid,  navicular-cuneiform and diffuse tarsometatarsal joint osteoarthritis. Ligaments Suboptimally assessed by CT. Muscles and Tendons Generalized muscle atrophy.  No evidence of tendon entrapment. Soft tissues Extensive soft tissue swelling. IMPRESSION: Trimalleolar ankle fracture with minimal 1-2 mm displacement. The posterior malleolar fracture involves approximately 15% of the articular surface. Tiny additional fracture of the anterolateral distal tibia consistent with an anterior inferior tibiofibular ligament avulsion. Diffuse osteopenia. Generalized muscle atrophy. Mild hindfoot and midfoot osteoarthritis. Extensive soft tissue swelling. Electronically Signed   By: Maurine Simmering M.D.   On: 10/12/2021 08:59   US RENAL  Result Date: 10/21/2021 CLINICAL DATA:  AK I EXAM: RENAL / URINARY TRACT ULTRASOUND COMPLETE COMPARISON:  CT abdomen pelvis 01/15/2018 FINDINGS: Right Kidney: Renal measurements: 9.9 x 4.5 x 4.3 cm = volume: 100 mL. Echogenicity within normal limits. There is a cyst measuring 1.3 cm. No solid mass. No hydronephrosis. Left Kidney: Renal measurements: 10.2 x 5.3 x 5.6 cm = volume: 160 mL. Echogenicity within normal limits. No mass or hydronephrosis visualized. Bladder: Appears normal for degree of bladder distention. Other: None. IMPRESSION: No acute finding in the bilateral kidneys. Electronically Signed   By: Audie Pinto M.D.   On: 10/21/2021 15:11   CT ANKLE LEFT WO CONTRAST  Result Date: 10/15/2021 CLINICAL DATA:  Fracture, ankle EXAM: CT OF THE LEFT ANKLE WITHOUT CONTRAST TECHNIQUE: Multidetector CT imaging of the left ankle was performed according to the standard protocol. Multiplanar CT image reconstructions were also generated. COMPARISON:  Radiograph 10/11/2021 FINDINGS: Bones/Joint/Cartilage There is a minimally displaced distal fibular fracture, with 2 mm posterior displacement. There is an anterolateral distal tibia fracture consistent with an anterior inferior tibiofibular  ligament avulsion. Diffuse osteopenia. No other additional fracture. There is no significant tibiotalar osteoarthritis. There is mild talonavicular and calcaneocuboid osteoarthritis. Ligaments Suboptimally assessed by CT. Muscles and Tendons And no evidence of tendon entrapment.  Generalized muscle atrophy. Soft tissues Lateral ankle soft tissue swelling. IMPRESSION: Minimally displaced distal fibula fracture. Anterior inferior tibiofibular ligament avulsion fracture of the distal tibia. Electronically Signed   By: Maurine Simmering M.D.   On: 10/15/2021 17:03   MR Hip Left w/o contrast  Result Date: 10/05/2021 CLINICAL DATA:  Left hip pain EXAM: MR OF THE LEFT HIP WITHOUT CONTRAST TECHNIQUE: Multiplanar, multisequence MR imaging was performed. No intravenous contrast was administered. COMPARISON:  Hip radiograph 09/20/2021 FINDINGS: Bones: There is severe left hip arthritis with subchondral fracture of the femoral head, articular surface flattening, and periarticular bony edema and subchondral cystic change. No focal bone lesion. The visualized sacroiliac joints and symphysis pubis appear normal. Lower lumbar spine degenerative disc disease, partially visualized. Articular cartilage and labrum Articular cartilage: Severe chondrosis with essentially full-thickness cartilage loss. Labrum: Diffuse labral degeneration and tearing. Joint or bursal effusion Joint effusion: Trace joint effusion. Bursae: No evidence of trochanteric bursitis. Muscles and tendons Muscles and tendons: The gluteal tendons are intact. The proximal hamstrings are intact.Reactive edema within the left hip adductors. There is left greater than right gluteal muscle atrophy. Other findings Miscellaneous: Prior hysterectomy. IMPRESSION: Severe left hip osteoarthritis with subchondral fracture of the femoral head and articular surface flattening/partial collapse. Diffuse labral degeneration and tearing. Electronically Signed   By: Maurine Simmering M.D.   On:  10/05/2021 14:46   DG  CHEST PORT 1 VIEW  Result Date: 10/20/2021 CLINICAL DATA:  Leukocytosis, constipation EXAM: PORTABLE CHEST 1 VIEW COMPARISON:  Chest radiograph 01/20/2020 FINDINGS: Median sternotomy wires and mediastinal surgical clips are again seen. The heart is at the upper limits of normal for size. There is calcified atherosclerotic plaque of the aortic arch. The mediastinal contours are normal. Linear opacities in the left mid lung and base likely reflect atelectasis or scar. There is no focal consolidation. There is no pulmonary edema. There is no pleural effusion or pneumothorax. There is degenerative change of the right shoulder. There is no acute osseous abnormality. IMPRESSION: 1. Left basilar atelectasis or scar. No radiographic evidence of acute cardiopulmonary process. 2. Unchanged borderline cardiomegaly. Electronically Signed   By: Valetta Mole M.D.   On: 10/20/2021 10:04   DG Ankle Right Port  Result Date: 10/11/2021 CLINICAL DATA:  Post reduction. EXAM: PORTABLE RIGHT ANKLE - 2 VIEW COMPARISON:  10/11/2021. FINDINGS: Examination is limited due to overlying casting material. There is diffusely decreased mineralization of the bones. There is redemonstration of an oblique fracture of the distal fibular shaft and medial malleolus and possible fracture of the posterior tibia. Alignment is unchanged and there is no dislocation. A moderate calcaneal spurs present. Soft tissue swelling is noted about the ankle. IMPRESSION: Fractures of the distal fibula and medial malleolus, and possible fracture of the posterior tibia. Alignment is unchanged. Electronically Signed   By: Brett Fairy M.D.   On: 10/11/2021 23:47   DG Abd Portable 1V  Result Date: 10/20/2021 CLINICAL DATA:  Leukocytosis, constipation EXAM: PORTABLE ABDOMEN - 1 VIEW COMPARISON:  None. FINDINGS: There is a nonobstructive bowel gas pattern. There is no abnormally large colonic stool burden. There is no definite free  intraperitoneal air, suboptimally evaluated given supine technique. Right upper quadrant and midline upper abdominal surgical clips are noted. There is no gross organomegaly or abnormal soft tissue calcification. There is multilevel degenerative change of the lumbar spine and marked degenerative change of the left hip. IMPRESSION: Nonobstructive bowel gas pattern. No abnormally large colonic stool burden. Electronically Signed   By: Valetta Mole M.D.   On: 10/20/2021 10:05   XR C-ARM NO REPORT  Result Date: 09/27/2021 Please see Notes tab for imaging impression.   Assessment/Plan  1. Closed displaced bimalleolar fracture of right ankle, sequela Closed fracture of distal end of left fibula, unspecified fracture morphology, sequela -   Was evaluated by orthopedics.  Risk of surgery was very high in the setting of peripheral vascular disease and severe osteoporosis.  Orthopedics, Dr. Sharol Given, recommended nonweightbearing and nonsurgical management for 3 weeks -   Follow-up with orthopedics in 2 weeks  -    continue splinting  - oxyCODONE (ROXICODONE) 5 MG immediate release tablet; Take 1 tablet (5 mg total) by mouth every 8 (eight) hours as needed for severe pain.  Dispense: 10 tablet; Refill:  -  continue Robaxin 500 mg Q 8 hours PRN X 3 days for muscle spasm   2. Paroxysmal atrial fibrillation (HCC) -   was started on Eliquis for CHAD2DS2-VASc score of 3 or more and metoprolol succinate ER 25 mg daily  3. Acute lower UTI -  continue Keflex 500 mg BID for 3 days  4. Type 2 diabetes mellitus with diabetic neuropathy, with long-term current use of insulin (HCC) Lab Results  Component Value Date   HGBA1C 6.5 (H) 10/12/2021   -   continue Lantus 20 units daily and CBG BID -  continue Lyrica  50 mg BID  5. Essential hypertension -   Continue hydralazine 5 mg 3 times a day, Norvasc 5 mg daily, 10 mg daily and metoprolol succinate ER 25 mg daily  6. History of CVA (cerebrovascular  accident) -Stable, continue Eliquis  7. RLS (restless legs syndrome) -   Stable, continue Sinemet 25-100 mg 1-1/2 tab at bedtime  8. Chronic diastolic CHF (congestive heart failure) (HCC) -   No SOB Lasix 40 mg daily nitroglycerin PRN and isosorbide mononitrate ER 30 mg daily  9 Diarrhea, unspecified type - change Senna pus to PRN  10. AKI Lab Results  Component Value Date   NA 136 10/24/2021   K 4.5 10/24/2021   CO2 19 (L) 10/24/2021   GLUCOSE 190 (H) 10/24/2021   BUN 35 (H) 10/24/2021   CREATININE 1.16 (H) 10/24/2021   CALCIUM 8.3 (L) 10/24/2021   GFRNONAA 46 (L) 10/24/2021   -  will monitor    Family/ staff Communication: Discussed plan of care with resident and charge nurse.  Labs/tests ordered:  CBC and BMP  Goals of care:   Short-term care   Durenda Age, DNP, MSN, FNP-BC [facility] [phone] (Monday-Friday 8:00 a.m. - 5:00 p.m.) [phone] (after hours)

## 2021-10-25 NOTE — Progress Notes (Signed)
This encounter was created in error - please disregard.

## 2021-10-27 ENCOUNTER — Encounter: Payer: Self-pay | Admitting: Internal Medicine

## 2021-10-27 ENCOUNTER — Non-Acute Institutional Stay (SKILLED_NURSING_FACILITY): Payer: Medicare Other | Admitting: Internal Medicine

## 2021-10-27 DIAGNOSIS — E43 Unspecified severe protein-calorie malnutrition: Secondary | ICD-10-CM

## 2021-10-27 DIAGNOSIS — S82899D Other fracture of unspecified lower leg, subsequent encounter for closed fracture with routine healing: Secondary | ICD-10-CM

## 2021-10-27 DIAGNOSIS — N179 Acute kidney failure, unspecified: Secondary | ICD-10-CM | POA: Diagnosis not present

## 2021-10-27 DIAGNOSIS — E46 Unspecified protein-calorie malnutrition: Secondary | ICD-10-CM | POA: Insufficient documentation

## 2021-10-27 DIAGNOSIS — D649 Anemia, unspecified: Secondary | ICD-10-CM

## 2021-10-27 DIAGNOSIS — I10 Essential (primary) hypertension: Secondary | ICD-10-CM | POA: Diagnosis not present

## 2021-10-27 LAB — BASIC METABOLIC PANEL
BUN: 26 — AB (ref 4–21)
CO2: 24 — AB (ref 13–22)
Chloride: 107 (ref 99–108)
Creatinine: 0.9 (ref 0.5–1.1)
Glucose: 232
Potassium: 4.5 (ref 3.4–5.3)
Sodium: 137 (ref 137–147)

## 2021-10-27 LAB — COMPREHENSIVE METABOLIC PANEL
Calcium: 8.7 (ref 8.7–10.7)
GFR calc Af Amer: 64.11
GFR calc non Af Amer: 55.31

## 2021-10-27 LAB — CBC: RBC: 3.48 — AB (ref 3.87–5.11)

## 2021-10-27 LAB — CBC AND DIFFERENTIAL
HCT: 34 — AB (ref 36–46)
Hemoglobin: 10.9 — AB (ref 12.0–16.0)
Platelets: 276 (ref 150–399)
WBC: 8.7

## 2021-10-27 NOTE — Assessment & Plan Note (Signed)
She is complaining of pain from the splints and wrapping.  Daughter wishes her to be seen earlier by the orthopedist and has contacted Dr. Warren Danes office.

## 2021-10-27 NOTE — Progress Notes (Signed)
NURSING HOME LOCATION:  [facility] ROOM NUMBER:  [id number]  CODE STATUS:  Full Code  PCP:  [name] MD  This is a comprehensive admission note to this SNFperformed on this date less than 30 days from date of admission. Included are preadmission medical/surgical history; reconciled medication list; family history; social history and comprehensive review of systems.  Corrections and additions to the records were documented. Comprehensive physical exam was also performed. Additionally a clinical summary was entered for each active diagnosis pertinent to this admission in the Problem List to enhance continuity of care.  HPI: The patient had recently been hospitalized 11/9 - 10/23/2021 for right ankle bimalleolar acute displaced fracture and left ankle indeterminate nondisplaced distal fibular fracture with lateral ankle subcutaneous soft tissue edema following a mechanical fall.  Manual reduction was attempted unsuccessfully in the ED.  Orthopedics consulted; patient was a high surgical risk due to PVD and severe osteoporosis.  Dr. Lajoyce Corners recommended nonweightbearing and nonsurgical management for 3 weeks. Eliquis was initiated 11/16 for a flutter/A. fib in the context of CHA2DS 2-VASc score of at least 3.  Admission creatinine was 2.21 and GFR 21 indicating CKD stage IV.  At the time of discharge creatinine had improved to 1.51 and GFR 34 indicating CKD stage IIIb. She was discharged to the SNF on 11/21 for PT/OT. She was transferred back to the [facility], ER for dyspnea with hypoxia with O2 sats of 88% on room air in the context of wheezing as per her daughter.  Her daughter actually called EMS.  Chest x-ray was negative for acute process.  The radiologist questioned possible component of interstitial edema.  BNP was 291.  Troponin values were 31 and 33.  No D-dimer was performed as she was on Eliquis 5 mg twice daily.  Serially she had exhibited a normochromic, normocytic  anemia with H/H in the range of 10.2/30 -10.7/32.3. Upon presentation to the ED H/H were listed as 14/43.3; this is an outlier and lab error is suggested.  Protein caloric malnutrition was documented as albumin was 2.2 and total protein 4.9.  CKD stage IIIa is present with a creatinine 1.16 and GFR 46 indicating improvement in CKD to stage IIIa.  It was felt that she had some degree of atelectasis contributing to the symptoms.  She was treated with albuterol for bronchospasm and morphine for the ankle pain. Orthopedic assessment was conducted.  She was felt stable to return to the SNF to initiate PT/OT. Labs were repeated today here at the SNF.  H/H has risen from 10.2/30 up to 10.9/33.7.  There has been improvement in the CKD with creatinine dropping from 1.16 to 0.94 and GFR rising from 46 up to 55.31.  This would be high stage IIIa.  Past medical and surgical history: Includes CAD, diabetes with CKD and peripheral neuropathy, essential hypertension, hypothyroidism, RLS, Parkinson's, history of stroke, and history of depression. Extensive surgeries and procedures include abdominal hysterectomy, cholecystectomy, colonoscopy, CABG, lumbar laminectomy/decompression microdiscectomy, and shoulder surgery.  Social history: Nondrinker; never smoked.  Family history: Noncontributory due to advanced age.   Review of systems: Today she tells me she is nauseated yet she has virtually cleaned her lunch plate.  She is complaining that the splints and wrappings  are "hurting" her her ankle bones. Initially she seemed to deny any cardiac or neurologic prodrome.  Subsequently as I asked ROS questions, she began to mention palpitations and change in the heart rate as well as dizziness before the  fall.  Her daughter stated that she was simply transferring from the passenger side of the car into the wheelchair.  Upon standing she seemed to have weakness in her knees and fell to the ground.  Her daughter noted no change  in her countenance or clinical appearance.  Her daughter is apparently a Chief Executive Officer.  Constitutional: No fever, significant weight change  Eyes: No redness, discharge, pain, vision change ENT/mouth: No nasal congestion, purulent discharge, earache, change in hearing, sore throat  Cardiovascular: No chest pain,paroxysmal nocturnal dyspnea, claudication, edema  Respiratory: No cough, sputum production, hemoptysis, DOE, significant snoring, apnea  Gastrointestinal: No heartburn, dysphagia, abdominal pain, vomiting, rectal bleeding, melena, change in bowels Genitourinary: No dysuria, hematuria, pyuria, incontinence, nocturia Dermatologic: No rash, pruritus, change in appearance of skin Neurologic: No syncope, seizures, numbness, tingling Psychiatric: No significant anxiety, depression, insomnia, anorexia Endocrine: No change in hair/skin/nails, excessive thirst, excessive hunger, excessive urination  Hematologic/lymphatic: No significant bruising, lymphadenopathy, abnormal bleeding Allergy/immunology: No itchy/watery eyes, significant sneezing, urticaria, angioedema  Physical exam:  Pertinent or positive findings: She appears her age and somewhat chronically ill.  The right pupil is distorted.  Ptosis is present, greater on the right.  She is wearing nasal oxygen which she states is causing a headache.  She has complete dentures.  Low-grade rhonchi are noted diffusely.  Heart rhythm is irregular; rate is well controlled.  Abdomen is protuberant.  The lower extremities are wrapped.  Interosseous wasting is noted.  General appearance: no acute distress, increased work of breathing is present.   Lymphatic: No lymphadenopathy about the head, neck, axilla. Eyes: No conjunctival inflammation or lid edema is present. There is no scleral icterus. Ears:  External ear exam shows no significant lesions or deformities.   Nose:  External nasal examination shows no deformity or inflammation. Nasal mucosa  are pink and moist without lesions, exudates Neck:  No thyromegaly, masses, tenderness noted.    Heart:  No gallop, murmur, click, rub.  Lungs:  without wheezes, rales, rubs. Abdomen: Bowel sounds are normal.  Abdomen is soft and nontender with no organomegaly, hernias, masses. GU: Deferred  Extremities:  No cyanosis, clubbing. Neurologic exam:  Balance, Rhomberg, finger to nose testing could not be completed due to clinical state Skin: Warm & dry w/o tenting. No significant lesions or rash.  See clinical summary under each active problem in the Problem List with associated updated therapeutic plan

## 2021-10-27 NOTE — Assessment & Plan Note (Addendum)
Current albumin 2.2 and total protein 4.9. Nutritionist will assess @ SNF.

## 2021-10-27 NOTE — Patient Instructions (Signed)
See assessment and plan under each diagnosis in the problem list and acutely for this visit 

## 2021-10-27 NOTE — Assessment & Plan Note (Signed)
Blood pressure today is soft but blood pressure has been variable with prior recordings of 103/62, 140/56, and 154/68.  The average will be verified and medication regimen adjusted based on the average, not outliers.

## 2021-10-29 NOTE — Assessment & Plan Note (Signed)
Pregabalin & Benazepril will be adjusted if CKD progressive.

## 2021-10-29 NOTE — Assessment & Plan Note (Signed)
No bleeding dyscrasias reported by staff 

## 2021-11-01 ENCOUNTER — Ambulatory Visit: Payer: Self-pay

## 2021-11-01 ENCOUNTER — Encounter: Payer: Self-pay | Admitting: Adult Health

## 2021-11-01 ENCOUNTER — Ambulatory Visit (INDEPENDENT_AMBULATORY_CARE_PROVIDER_SITE_OTHER): Payer: Medicare Other | Admitting: Family

## 2021-11-01 ENCOUNTER — Encounter: Payer: Self-pay | Admitting: Family

## 2021-11-01 ENCOUNTER — Non-Acute Institutional Stay (SKILLED_NURSING_FACILITY): Payer: Medicare Other | Admitting: Adult Health

## 2021-11-01 DIAGNOSIS — M25571 Pain in right ankle and joints of right foot: Secondary | ICD-10-CM

## 2021-11-01 DIAGNOSIS — S82899D Other fracture of unspecified lower leg, subsequent encounter for closed fracture with routine healing: Secondary | ICD-10-CM | POA: Diagnosis not present

## 2021-11-01 DIAGNOSIS — E1140 Type 2 diabetes mellitus with diabetic neuropathy, unspecified: Secondary | ICD-10-CM

## 2021-11-01 DIAGNOSIS — Z794 Long term (current) use of insulin: Secondary | ICD-10-CM | POA: Diagnosis not present

## 2021-11-01 DIAGNOSIS — S82892D Other fracture of left lower leg, subsequent encounter for closed fracture with routine healing: Secondary | ICD-10-CM

## 2021-11-01 DIAGNOSIS — I480 Paroxysmal atrial fibrillation: Secondary | ICD-10-CM

## 2021-11-01 DIAGNOSIS — M25572 Pain in left ankle and joints of left foot: Secondary | ICD-10-CM

## 2021-11-01 DIAGNOSIS — I10 Essential (primary) hypertension: Secondary | ICD-10-CM | POA: Diagnosis not present

## 2021-11-01 DIAGNOSIS — S82891A Other fracture of right lower leg, initial encounter for closed fracture: Secondary | ICD-10-CM

## 2021-11-01 DIAGNOSIS — S82841S Displaced bimalleolar fracture of right lower leg, sequela: Secondary | ICD-10-CM

## 2021-11-01 MED ORDER — OXYCODONE HCL 5 MG PO TABS: 5.0000 mg | ORAL_TABLET | Freq: Three times a day (TID) | ORAL | 0 refills | Status: DC | PRN

## 2021-11-01 NOTE — Progress Notes (Signed)
Office Visit Note   Patient: Sierra Wright           Date of Birth: 01-Mar-1934           MRN: [id number] Visit Date: 11/01/2021              Requested by: [name], MD [address] [place],  [place] [address] PCP: [name], MD  No chief complaint on file.     HPI: The patient is an 87 year old woman who presents in follow-up for bilateral ankle fractures.  Initial injury was November 9.  CT of the left ankle on November 13 showed minimally displaced distal fibula fracture as well as anterior tibiofibular ligament avulsion fracture of the distal tibia  CT of the right ankle on November 10 showed Trimalleolar ankle fracture with minimal 1-2 mm displacement. The posterior malleolar fracture involves approximately 15% of the articular surface. Tiny additional fracture of the anterolateral distal tibia consistent with an anterior inferior tibiofibular ligament avulsion.  Has been having bilateral posterior splints nonweightbearing.  She is a total care patient using Hoyer lift for transfers.  Today she is complaining of discomfort in bilateral feet.  She is also complaining of nausea which appears to be her baseline  Assessment & Plan: Visit Diagnoses:  1. Acute bilateral ankle pain     Plan: Discussed case with Dr. Lajoyce Corners.  We will plan for nonoperative management bilateral ankle fractures.  She will be placed in a cam boot today.  She may be weightbearing as tolerated on bilateral lower extremities.  Up with physical therapy.  We will plan to follow-up in the office in 3 weeks with repeat radiographs.  Follow-Up Instructions: No follow-ups on file.   Ortho Exam  Patient is alert, oriented, no adenopathy, well-dressed, normal affect, normal respiratory effort.  On examination bilateral lower extremities the edema is well controlled.  She does have thin friable skin there is some ecchymosis scattered about the foot and ankle.  There is no skin  breakdown no blistering no erythema Imaging: No results found. No images are attached to the encounter.  Labs: Lab Results  Component Value Date   HGBA1C 6.5 (H) 10/12/2021   HGBA1C 6.5 (H) 07/15/2016   HGBA1C 6.9 (H) 07/26/2015   REPTSTATUS 10/25/2021 FINAL 10/20/2021   CULT  10/20/2021    NO GROWTH 5 DAYS Performed at [facility], [address], [place], [place] [address]    LABORGA KLEBSIELLA PNEUMONIAE (A) 10/20/2021     Lab Results  Component Value Date   ALBUMIN 2.2 (L) 10/24/2021   ALBUMIN 3.4 (L) 10/11/2021   ALBUMIN 3.9 08/11/2018    Lab Results  Component Value Date   MG 1.9 10/21/2021   MG 1.8 10/20/2021   MG 1.6 (L) 10/13/2021   Lab Results  Component Value Date   VD25OH 37.60 10/12/2021    No results found for: PREALBUMIN CBC EXTENDED Latest Ref Rng & Units 10/24/2021 10/24/2021 10/23/2021  WBC 4.0 - 10.5 K/uL - 7.8 11.4(H)  RBC 3.87 - 5.11 MIL/uL - 4.49 3.39(L)  HGB 12.0 - 15.0 g/dL 10.2(L) 14.0 10.7(L)  HCT 36.0 - 46.0 % 30.0(L) 43.3 32.3(L)  PLT 150 - 400 K/uL - 226 263  NEUTROABS 1.7 - 7.7 K/uL - 5.4 8.2(H)  LYMPHSABS 0.7 - 4.0 K/uL - 1.3 1.7     There is no height or weight on file to calculate BMI.  Orders:  Orders Placed This Encounter  Procedures  XR Ankle Complete Right   XR Ankle Complete Left   No orders of the defined types were placed in this encounter.    Procedures: No procedures performed  Clinical Data: No additional findings.  ROS:  All other systems negative, except as noted in the HPI. Review of Systems  All other systems reviewed and are negative.  Objective: Vital Signs: There were no vitals taken for this visit.  Specialty Comments:  No specialty comments available.  PMFS History: Patient Active Problem List   Diagnosis Date Noted   Unspecified protein-calorie malnutrition ([place]) 10/27/2021   Leukocytosis 10/20/2021   Acute lower UTI 10/20/2021   Nausea    PVD (peripheral vascular  disease) ([place])    Ankle fracture 10/12/2021   AKI (acute kidney injury) ([place]) 10/12/2021   Anemia 10/12/2021   Diabetic neuropathy ([place]) 01/25/2021   Diabetic retinopathy ([place]) 01/25/2021   Hyperglycemia due to type 2 diabetes mellitus ([place]) 01/25/2021   Unspecified mononeuropathy of right lower limb 01/25/2021   S/P cardiac cath 08/13/18 stable CAD 08/14/2018   Orthopnea 08/12/2018   Chest pain 09/01/2016   Diabetic polyneuropathy associated with diabetes mellitus due to underlying condition ([place]) 07/26/2016   Chronic daily headache 07/26/2016   RLS (restless legs syndrome) 07/26/2016   Ulnar neuropathy of right upper extremity 07/26/2016   Ischemic stroke ([place]) 07/26/2016   CVA (cerebral infarction): Early subacute 07/14/2016   Back pain 07/14/2016   Essential hypertension    2nd degree AV block    Thyroid activity decreased    Coronary artery disease involving coronary bypass graft of native heart without angina pectoris    Binocular vision disorder with diplopia 07/13/2016   Weakness 07/13/2016   Chronic diastolic CHF (congestive heart failure) ([place]) 04/16/2016   Osteoporosis 07/27/2015    Class: Chronic   Spinal stenosis, lumbar region, with neurogenic claudication 07/26/2015    Class: Chronic   Spondylolisthesis of lumbar region 07/26/2015    Class: Chronic   Hx of CABG 03/15/2014   Osteoarthritis 03/15/2014   DM type 2 goal A1C below 7.5 03/15/2014   Peripheral edema 03/15/2014   Benign hypertensive heart disease without heart failure 03/15/2014   Right bundle branch block 03/15/2014   First degree AV block 03/15/2014   Sinus bradycardia by electrocardiogram 03/15/2014   Dizziness 03/15/2014   Past Medical History:  Diagnosis Date   Arthritis    Coronary artery disease 1999   CABG   Depression    Diabetes mellitus without complication ([place])    type II; metformin and Amaryl   Diabetic neuropathy ([place])    History of anemia as a child    HOH (hard of hearing)     Hypertension    Hypothyroidism    synthroid   PONV (postoperative nausea and vomiting)    Restless leg syndrome    S/P cardiac cath 08/13/18 stable CAD 08/14/2018   Shortness of breath 09/03/12   ECHO- The LA is Moderately Dilated, mild mitral annular calcification. mild pulmonary hypertension, moderate concentric LV hypertrophy. Atrial septum is aneurysmal. Aortic valve appears to be mildly sclerotic.EF 88%   Urinary incontinence    Weakness of both legs     Family History  Problem Relation Age of Onset   Other Mother    Pneumonia Mother    Heart attack Father    Liver cancer Sister    Stroke Sister    Heart attack Brother 42   Stroke Son 86   Healthy Daughter     Past  Surgical History:  Procedure Laterality Date   ABDOMINAL HYSTERECTOMY     APPENDECTOMY     BACK SURGERY     CARDIAC CATHERIZATION  08/20/03   Widely patent bypass grafts. Normal left ventricular systolic function.   CARDIAC CATHETERIZATION     CHOLECYSTECTOMY     COLONOSCOPY     CORONARY ARTERY BYPASS GRAFT     EYE SURGERY     bilateral cataracts   LEFT HEART CATH AND CORONARY ANGIOGRAPHY N/A 08/13/2018   Procedure: LEFT HEART CATH AND CORONARY ANGIOGRAPHY;  Surgeon: Burnell Blanks, MD;  Location: [facility];  Service: Cardiovascular;  Laterality: N/A;   LUMBAR LAMINECTOMY/DECOMPRESSION MICRODISCECTOMY N/A 07/26/2015   Procedure: BILATERAL Lumbar 3-4 SUBTOTAL HEMILAMINECTOMY WITH LATERAL RECESS DECOMPRESSION;  Surgeon: Jessy Oto, MD;  Location: [place] [facility];  Service: Orthopedics;  Laterality: N/A;   SHOULDER SURGERY     TUBAL LIGATION     Social History   Occupational History   Not on file  Tobacco Use   Smoking status: Never    Passive exposure: Never   Smokeless tobacco: Never  Vaping Use   Vaping Use: Never used  Substance and Sexual Activity   Alcohol use: No   Drug use: No   Sexual activity: Not Currently

## 2021-11-01 NOTE — Progress Notes (Signed)
Location:  [facility] Room Number: 306-B Place of Service:  SNF (31) Provider:  Kenard Gower, DNP, FNP-BC  Patient Care Team: [name], MD as PCP - General (Family Medicine) [name], Deloris Ping, MD as PCP - Cardiology (Cardiology) Glendale Chard, DO as Consulting Physician (Neurology)  Extended Emergency Contact Information Primary Emergency Contact: Kai Levins Address: [address]          [place], [place] [address] D[place] of [place] Home Phone: [phone] Relation: Spouse Secondary Emergency Contact: DENNIS,SHERRIE Mobile Phone: [phone] Relation: Daughter  Code Status: Full Code   Goals of care: Advanced Directive information Advanced Directives 11/01/2021  Does Patient Have a Medical Advance Directive? No  Type of Advance Directive -  Would patient like information on creating a medical advance directive? No - Patient declined     Chief Complaint  Patient presents with   Acute Visit    Short term rehabilitation     HPI:  Pt is a 87 y.o. female seen today for an acute visit. She is a short-term care resident of [facility].  CBGs ranging from 165-187.  He takes insulin glargine 100 units/mL 20 units daily for diabetes mellitus. SBPs ranging from 107 to 143. She takes hydroxyzine 25 mg 3 times a day, benazepril 10 mg daily and amlodipine 5 mg daily for hypertension.  CBGs ranging from 107 to 143.  She currently takes Eliquis 5 mg twice a day and metoprolol succinate ER 25 mg daily for atrial fibrillation. She had a mechanical fall and sustained right ankle bimalleolar acute displaced fracture and left ankle indeterminate nondisplaced distal fibular fracture.  She just followed up with orthopedics today who recommended nonoperative management and was placed in bilateral cam boot.   Past Medical History:  Diagnosis Date   Arthritis    Coronary artery disease 1999   CABG   Depression     Diabetes mellitus without complication (HCC)    type II; metformin and Amaryl   Diabetic neuropathy (HCC)    History of anemia as a child    HOH (hard of hearing)    Hypertension    Hypothyroidism    synthroid   PONV (postoperative nausea and vomiting)    Restless leg syndrome    S/P cardiac cath 08/13/18 stable CAD 08/14/2018   Shortness of breath 09/03/12   ECHO- The LA is Moderately Dilated, mild mitral annular calcification. mild pulmonary hypertension, moderate concentric LV hypertrophy. Atrial septum is aneurysmal. Aortic valve appears to be mildly sclerotic.EF 88%   Urinary incontinence    Weakness of both legs    Past Surgical History:  Procedure Laterality Date   ABDOMINAL HYSTERECTOMY     APPENDECTOMY     BACK SURGERY     CARDIAC CATHERIZATION  08/20/03   Widely patent bypass grafts. Normal left ventricular systolic function.   CARDIAC CATHETERIZATION     CHOLECYSTECTOMY     COLONOSCOPY     CORONARY ARTERY BYPASS GRAFT     EYE SURGERY     bilateral cataracts   LEFT HEART CATH AND CORONARY ANGIOGRAPHY N/A 08/13/2018   Procedure: LEFT HEART CATH AND CORONARY ANGIOGRAPHY;  Surgeon: Kathleene Hazel, MD;  Location: [facility] INVASIVE CV LAB;  Service: Cardiovascular;  Laterality: N/A;   LUMBAR LAMINECTOMY/DECOMPRESSION MICRODISCECTOMY N/A 07/26/2015   Procedure: BILATERAL Lumbar 3-4 SUBTOTAL HEMILAMINECTOMY WITH LATERAL RECESS DECOMPRESSION;  Surgeon: Kerrin Champagne, MD;  Location: [facility] OR;  Service: Orthopedics;  Laterality: N/A;   SHOULDER  SURGERY     TUBAL LIGATION      Allergies  Allergen Reactions   Duloxetine Hcl Nausea And Vomiting and Other (See Comments)   Statins Other (See Comments)    Causes myalgias Other reaction(s): Unknown   Amitriptyline Other (See Comments)    Pt. States that it caused it to be suicidal.    Benadryl [Diphenhydramine Hcl (Sleep)] Other (See Comments)    Makes the patient very "hyper"   Ezetimibe     Other reaction(s): Unknown Other  reaction(s): Unknown   Metformin Hcl     Other reaction(s): Unknown Other reaction(s): Unknown   Other     Other reaction(s): Unknown   Sitagliptin     Other reaction(s): Unknown Other reaction(s): Unknown   Tylenol [Acetaminophen] Nausea Only   Colesevelam Hcl Other (See Comments)    Causes dizziness Other reaction(s): Unknown    Outpatient Encounter Medications as of 11/01/2021  Medication Sig   amLODipine (NORVASC) 5 MG tablet Take 1 tablet by mouth once daily   apixaban (ELIQUIS) 5 MG TABS tablet Take 1 tablet (5 mg total) by mouth 2 (two) times daily.   Ascorbic Acid (VITAMIN C) 1000 MG tablet Take 1,000 mg by mouth daily.   benazepril (LOTENSIN) 10 MG tablet Take 1 tablet (10 mg total) by mouth daily.   bisacodyl (DULCOLAX) 10 MG suppository Place 10 mg rectally as needed for moderate constipation.   carbidopa-levodopa (SINEMET IR) 25-100 MG tablet Take 1.5 tablets by mouth at bedtime.   cholecalciferol (VITAMIN D3) 25 MCG (1000 UNIT) tablet Take 1,000 Units by mouth daily.   furosemide (LASIX) 40 MG tablet Take 1 tablet (40 mg total) by mouth daily.   hydrALAZINE (APRESOLINE) 25 MG tablet TAKE 1 TABLET BY MOUTH THREE TIMES DAILY   isosorbide mononitrate (IMDUR) 30 MG 24 hr tablet Take 1 tablet by mouth once daily   LANTUS SOLOSTAR 100 UNIT/ML Solostar Pen Inject 20 Units into the skin daily.   levothyroxine (SYNTHROID, LEVOTHROID) 100 MCG tablet Take 100 mcg by mouth daily before breakfast.   Magnesium Hydroxide (MILK OF MAGNESIA PO) Take 30 mLs by mouth as needed.   methocarbamol (ROBAXIN) 500 MG tablet Take 500 mg by mouth every 8 (eight) hours as needed for muscle spasms. X 3 days and on 10/27/21 change to every 12 hours as needed for spasms x 5 days   metoprolol succinate (TOPROL-XL) 25 MG 24 hr tablet Take 1 tablet by mouth once daily   nitroGLYCERIN (NITROSTAT) 0.4 MG SL tablet DISSOLVE ONE TABLET UNDER THE TONGUE EVERY 5 MINUTES AS NEEDED FOR CHEST PAIN.   nystatin  (NYAMYC) powder APPLY TOPICALLY TO BUTTOCKS AND GROIN FOLLOWED BY A THIN LAYER OF BARRIER CREAM TWICE A DAY   ondansetron (ZOFRAN) 4 MG tablet Take 4 mg by mouth every 6 (six) hours as needed for nausea.   oxyCODONE (ROXICODONE) 5 MG immediate release tablet Take 1 tablet (5 mg total) by mouth every 8 (eight) hours as needed for severe pain.   Oyster Shell Calcium 500 MG TABS TAKE 1 TABLET BY MOUTH ONCE DAILY FOR SUPPLEMENT   polyethylene glycol (MIRALAX / GLYCOLAX) 17 g packet Take 17 g by mouth daily.   pregabalin (LYRICA) 50 MG capsule Take 50 mg by mouth 2 (two) times daily.   senna-docusate (SENOKOT-S) 8.6-50 MG tablet Take 1 tablet by mouth 2 (two) times daily.   Sodium Phosphates (RA SALINE ENEMA RE) Place 1 Dose rectally as needed.   vitamin B-12 (CYANOCOBALAMIN) 1000 MCG  tablet Take 1,000 mcg by mouth daily.   calcium carbonate (OS-CAL - DOSED IN MG OF ELEMENTAL CALCIUM) 1250 (500 Ca) MG tablet Take 1 tablet by mouth daily.   No facility-administered encounter medications on file as of 11/01/2021.    Review of Systems  GENERAL: No change in appetite, no fatigue, no weight changes, no fever or chills  MOUTH and THROAT: Denies oral discomfort, gingival pain or bleeding RESPIRATORY: no cough, SOB, DOE, wheezing, hemoptysis CARDIAC: No chest pain, edema or palpitations GI: No abdominal pain, diarrhea, constipation, heart burn, nausea or vomiting GU: Denies dysuria, frequency, hematuria or discharge NEUROLOGICAL: Denies dizziness, syncope, numbness, or headache PSYCHIATRIC: Denies feelings of depression or anxiety. No report of hallucinations, insomnia, paranoia, or agitation   Immunization History  Administered Date(s) Administered   Influenza Split 09/13/2007, 09/02/2009, 09/02/2010, 09/03/2011, 08/19/2012, 09/22/2013, 08/29/2016, 08/14/2018   Influenza, High Dose Seasonal PF 08/14/2018, 09/30/2019   Influenza,inj,Quad PF,6+ Mos 11/09/2020   Influenza,inj,quad, With Preservative  08/05/2014, 10/18/2015   Janssen (J&J) SARS-COV-2 Vaccination 04/13/2020   PFIZER(Purple Top)SARS-COV-2 Vaccination 01/04/2021   Pneumococcal Conjugate-13 09/08/2008, 01/11/2015   Td 07/04/2007   Pertinent  Health Maintenance Due  Topic Date Due   OPHTHALMOLOGY EXAM  Never done   INFLUENZA VACCINE  07/03/2021   FOOT EXAM  01/25/2022   HEMOGLOBIN A1C  04/11/2022   DEXA SCAN  Completed   Fall Risk 10/21/2021 10/21/2021 10/22/2021 10/22/2021 10/24/2021  Falls in the past year? - - - - -  Was there an injury with Fall? - - - - -  Fall Risk Category Calculator - - - - -  Fall Risk Category - - - - -  Patient Fall Risk Level High fall risk High fall risk High fall risk High fall risk High fall risk  Patient at Risk for Falls Due to - - - - -  Fall risk Follow up - - - - -     Vitals:   11/01/21 1427  BP: (!) 148/72  Pulse: (!) 53  Resp: 20  Temp: (!) 97.3 F (36.3 C)  Weight: 222 lb 6.4 oz (100.9 kg)  Height: 5\' 6"  (1.676 m)   Body mass index is 35.9 kg/m.  Physical Exam  GENERAL APPEARANCE: Well nourished. In no acute distress. Normal body habitus SKIN:  Skin is warm and dry.  MOUTH and THROAT: Lips are without lesions. Oral mucosa is moist and without lesions.  RESPIRATORY: Breathing is even & unlabored, BS CTAB CARDIAC: RRR, no murmur,no extra heart sounds, no edema GI: Abdomen soft, normal BS, no masses, no tenderness, NEUROLOGICAL: Speech is clear. Alert to self. PSYCHIATRIC:  Affect and behavior are appropriate  Labs reviewed: Recent Labs    10/13/21 0324 10/14/21 0339 10/20/21 0216 10/21/21 0156 10/22/21 0309 10/23/21 0551 10/24/21 1045 10/24/21 1111 10/27/21 0000  NA 133*   < > 131* 127* 128* 132* 136 136 137  K 4.4   < > 4.3 4.8 4.8 4.9 4.7 4.5 4.5  CL 106   < > 99 98 99 103 107  --  107  CO2 20*   < > 25 20* 20* 23 19*  --  24*  GLUCOSE 173*   < > 128* 193* 173* 175* 190*  --   --   BUN 23   < > 35* 53* 63* 50* 35*  --  26*  CREATININE 1.14*   <  > 1.45* 2.02* 2.04* 1.51* 1.16*  --  0.9  CALCIUM 8.1*   < >  8.3* 8.0* 7.7* 7.9* 8.3*  --  8.7  MG 1.6*  --  1.8 1.9  --   --   --   --   --    < > = values in this interval not displayed.   Recent Labs    10/11/21 1815 10/24/21 1045  AST 14* 20  ALT 8 20  ALKPHOS 81 89  BILITOT 0.4 0.6  PROT 5.8* 4.9*  ALBUMIN 3.4* 2.2*   Recent Labs    10/22/21 0309 10/23/21 0551 10/24/21 1045 10/24/21 1111 10/27/21 0000  WBC 16.7* 11.4* 7.8  --  8.7  NEUTROABS 13.1* 8.2* 5.4  --   --   HGB 10.0* 10.7* 14.0 10.2* 10.9*  HCT 30.5* 32.3* 43.3 30.0* 34*  MCV 95.9 95.3 96.4  --   --   PLT 232 263 226  --  276   Lab Results  Component Value Date   TSH 3.277 08/11/2018   Lab Results  Component Value Date   HGBA1C 6.5 (H) 10/12/2021   Lab Results  Component Value Date   CHOL 149 08/12/2018   HDL 34 (L) 08/12/2018   LDLCALC 81 08/12/2018   TRIG 171 (H) 08/12/2018   CHOLHDL 4.4 08/12/2018    Significant Diagnostic Results in last 30 days:  DG Chest 2 View  Result Date: 10/24/2021 CLINICAL DATA:  Cough and chest pain beginning this morning. Shortness of breath. EXAM: CHEST - 2 VIEW COMPARISON:  10/20/2021 FINDINGS: Stable cardiomegaly.  Prior CABG. Increased diffuse interstitial prominence, suspicious for interstitial edema. No evidence of pulmonary consolidation or pleural effusion. IMPRESSION: Increased diffuse interstitial prominence, suspicious for interstitial edema. Stable cardiomegaly. Electronically Signed   By: Marlaine Hind M.D.   On: 10/24/2021 11:04   DG Pelvis 1-2 Views  Result Date: 10/11/2021 CLINICAL DATA:  Status post fall.  Pain EXAM: PELVIS - 1-2 VIEW COMPARISON:  None. FINDINGS: Markedly limited evaluation due to overlapping osseous structures and overlying soft tissues. There is no evidence of pelvic fracture or diastasis. No pelvic bone lesions are seen. Severe degenerative changes left hip. Degenerative changes of the sacroiliac joints. No hip dislocation. Vascular  calcification. IMPRESSION: 1. Markedly limited evaluation due to overlapping osseous structures and overlying soft tissues. 2. No definite acute displaced fracture, hip dislocation, or pelvic diastasis. Electronically Signed   By: Iven Finn M.D.   On: 10/11/2021 21:00   DG Ankle Complete Left  Result Date: 10/24/2021 CLINICAL DATA:  Bilateral ankle pain after fractures 3 weeks ago. EXAM: LEFT ANKLE COMPLETE - 3+ VIEW COMPARISON:  October 11, 2021. FINDINGS: Left ankle has been casted and immobilized. Distal left fibular fracture is not well visualized due to overlying cast. No new fracture or dislocation is noted. IMPRESSION: Status post casting and immobilization of distal left fibular fracture. Electronically Signed   By: Marijo Conception M.D.   On: 10/24/2021 12:17   DG Ankle Complete Left  Addendum Date: 10/11/2021   ADDENDUM REPORT: 10/11/2021 20:56 ADDENDUM: Findings should state no evidence of "severe" arthropathy or other focal bone abnormality. Findings should also mention diffuse decreased bone density. Electronically Signed   By: Iven Finn M.D.   On: 10/11/2021 20:56   Result Date: 10/11/2021 CLINICAL DATA:  fall pain EXAM: LEFT ANKLE COMPLETE - 3+ VIEW COMPARISON:  X-ray left tibia fibula 03/02/2005 FINDINGS: Age-indeterminate nondisplaced distal fibular fracture. There is no evidence of definite acute displaced fracture, dislocation, or joint effusion. Degenerative changes of the navicular noted. Plantar calcaneal spur. There is  no evidence of arthropathy or other focal bone abnormality. Mild lateral ankle subcutaneus soft tissue edema. Vascular calcifications. IMPRESSION: indeterminate nondisplaced distal fibular fracture. Associated lateral ankle subcutaneus soft tissue edema. Correlate with point tenderness to evaluate for acuity. Electronically Signed: By: Iven Finn M.D. On: 10/11/2021 20:53   DG Ankle Complete Right  Result Date: 10/24/2021 CLINICAL DATA:  Bilateral  ankle pain after fractures 3 weeks ago. EXAM: RIGHT ANKLE - COMPLETE 3+ VIEW COMPARISON:  October 11, 2021. FINDINGS: Right ankle has been casted and immobilized. Minimally displaced distal fibular and medial malleolar fractures are again noted. No new fracture is noted. IMPRESSION: Status post casting and immobilization of distal right fibular and tibial fractures. Electronically Signed   By: Marijo Conception M.D.   On: 10/24/2021 12:19   DG Ankle Complete Right  Result Date: 10/11/2021 CLINICAL DATA:  Status fall EXAM: RIGHT ANKLE - COMPLETE 3+ VIEW COMPARISON:  None. FINDINGS: Diffusely decreased bone density. Acute inferiorly displaced medial malleolar fracture. Acute displaced distal fibular fracture. No definite posterior malleolar fracture. Vague cortical irregularity the medial talus with no definite acute displaced talar fracture. Degenerative changes of the midfoot. Plantar calcaneal spur. There is no evidence of severe arthropathy or other focal bone abnormality. Subcutaneus soft tissue edema of the ankle. Vascular clips overlie the soft tissues of the right distal leg. Vascular calcifications. IMPRESSION: 1. Bimalleolar acute displaced fracture. 2. Vague cortical irregularity the medial talus with no definite acute displaced talar fracture. 3. Diffusely decreased bone density. Electronically Signed   By: Iven Finn M.D.   On: 10/11/2021 20:57   CT ANKLE RIGHT WO CONTRAST  Result Date: 10/12/2021 CLINICAL DATA:  Fracture, ankle EXAM: CT OF THE RIGHT ANKLE WITHOUT CONTRAST TECHNIQUE: Multidetector CT imaging of the right ankle was performed according to the standard protocol. Multiplanar CT image reconstructions were also generated. COMPARISON:  Ankle radiograph 10/11/2021 FINDINGS: Bones/Joint/Cartilage There is a trimalleolar ankle fracture. Oblique distal fibular fracture has minimal 2 mm posterolateral displacement. Medial malleolar fracture has minimal 1-2 mm placement. Posterior  malleolar fracture has proximally 1 mm posterior displacement and involves approximately 15% of the articular surface. Tiny fracture of the anterolateral distal tibia consistent with anterior inferior tibiofibular ligament avulsion (series 6, image 42). There is a splint in place. The ankle mortise appears well aligned. Osteopenia. There is mild posterior subtalar, talonavicular, calcaneocuboid, navicular-cuneiform and diffuse tarsometatarsal joint osteoarthritis. Ligaments Suboptimally assessed by CT. Muscles and Tendons Generalized muscle atrophy.  No evidence of tendon entrapment. Soft tissues Extensive soft tissue swelling. IMPRESSION: Trimalleolar ankle fracture with minimal 1-2 mm displacement. The posterior malleolar fracture involves approximately 15% of the articular surface. Tiny additional fracture of the anterolateral distal tibia consistent with an anterior inferior tibiofibular ligament avulsion. Diffuse osteopenia. Generalized muscle atrophy. Mild hindfoot and midfoot osteoarthritis. Extensive soft tissue swelling. Electronically Signed   By: Maurine Simmering M.D.   On: 10/12/2021 08:59   US RENAL  Result Date: 10/21/2021 CLINICAL DATA:  AK I EXAM: RENAL / URINARY TRACT ULTRASOUND COMPLETE COMPARISON:  CT abdomen pelvis 01/15/2018 FINDINGS: Right Kidney: Renal measurements: 9.9 x 4.5 x 4.3 cm = volume: 100 mL. Echogenicity within normal limits. There is a cyst measuring 1.3 cm. No solid mass. No hydronephrosis. Left Kidney: Renal measurements: 10.2 x 5.3 x 5.6 cm = volume: 160 mL. Echogenicity within normal limits. No mass or hydronephrosis visualized. Bladder: Appears normal for degree of bladder distention. Other: None. IMPRESSION: No acute finding in the bilateral kidneys. Electronically Signed  By: Audie Pinto M.D.   On: 10/21/2021 15:11   CT ANKLE LEFT WO CONTRAST  Result Date: 10/15/2021 CLINICAL DATA:  Fracture, ankle EXAM: CT OF THE LEFT ANKLE WITHOUT CONTRAST TECHNIQUE:  Multidetector CT imaging of the left ankle was performed according to the standard protocol. Multiplanar CT image reconstructions were also generated. COMPARISON:  Radiograph 10/11/2021 FINDINGS: Bones/Joint/Cartilage There is a minimally displaced distal fibular fracture, with 2 mm posterior displacement. There is an anterolateral distal tibia fracture consistent with an anterior inferior tibiofibular ligament avulsion. Diffuse osteopenia. No other additional fracture. There is no significant tibiotalar osteoarthritis. There is mild talonavicular and calcaneocuboid osteoarthritis. Ligaments Suboptimally assessed by CT. Muscles and Tendons And no evidence of tendon entrapment.  Generalized muscle atrophy. Soft tissues Lateral ankle soft tissue swelling. IMPRESSION: Minimally displaced distal fibula fracture. Anterior inferior tibiofibular ligament avulsion fracture of the distal tibia. Electronically Signed   By: Maurine Simmering M.D.   On: 10/15/2021 17:03   MR Hip Left w/o contrast  Result Date: 10/05/2021 CLINICAL DATA:  Left hip pain EXAM: MR OF THE LEFT HIP WITHOUT CONTRAST TECHNIQUE: Multiplanar, multisequence MR imaging was performed. No intravenous contrast was administered. COMPARISON:  Hip radiograph 09/20/2021 FINDINGS: Bones: There is severe left hip arthritis with subchondral fracture of the femoral head, articular surface flattening, and periarticular bony edema and subchondral cystic change. No focal bone lesion. The visualized sacroiliac joints and symphysis pubis appear normal. Lower lumbar spine degenerative disc disease, partially visualized. Articular cartilage and labrum Articular cartilage: Severe chondrosis with essentially full-thickness cartilage loss. Labrum: Diffuse labral degeneration and tearing. Joint or bursal effusion Joint effusion: Trace joint effusion. Bursae: No evidence of trochanteric bursitis. Muscles and tendons Muscles and tendons: The gluteal tendons are intact. The proximal  hamstrings are intact.Reactive edema within the left hip adductors. There is left greater than right gluteal muscle atrophy. Other findings Miscellaneous: Prior hysterectomy. IMPRESSION: Severe left hip osteoarthritis with subchondral fracture of the femoral head and articular surface flattening/partial collapse. Diffuse labral degeneration and tearing. Electronically Signed   By: Maurine Simmering M.D.   On: 10/05/2021 14:46   DG CHEST PORT 1 VIEW  Result Date: 10/20/2021 CLINICAL DATA:  Leukocytosis, constipation EXAM: PORTABLE CHEST 1 VIEW COMPARISON:  Chest radiograph 01/20/2020 FINDINGS: Median sternotomy wires and mediastinal surgical clips are again seen. The heart is at the upper limits of normal for size. There is calcified atherosclerotic plaque of the aortic arch. The mediastinal contours are normal. Linear opacities in the left mid lung and base likely reflect atelectasis or scar. There is no focal consolidation. There is no pulmonary edema. There is no pleural effusion or pneumothorax. There is degenerative change of the right shoulder. There is no acute osseous abnormality. IMPRESSION: 1. Left basilar atelectasis or scar. No radiographic evidence of acute cardiopulmonary process. 2. Unchanged borderline cardiomegaly. Electronically Signed   By: Valetta Mole M.D.   On: 10/20/2021 10:04   DG Ankle Right Port  Result Date: 10/11/2021 CLINICAL DATA:  Post reduction. EXAM: PORTABLE RIGHT ANKLE - 2 VIEW COMPARISON:  10/11/2021. FINDINGS: Examination is limited due to overlying casting material. There is diffusely decreased mineralization of the bones. There is redemonstration of an oblique fracture of the distal fibular shaft and medial malleolus and possible fracture of the posterior tibia. Alignment is unchanged and there is no dislocation. A moderate calcaneal spurs present. Soft tissue swelling is noted about the ankle. IMPRESSION: Fractures of the distal fibula and medial malleolus, and possible  fracture of the  posterior tibia. Alignment is unchanged. Electronically Signed   By: Brett Fairy M.D.   On: 10/11/2021 23:47   DG Abd Portable 1V  Result Date: 10/20/2021 CLINICAL DATA:  Leukocytosis, constipation EXAM: PORTABLE ABDOMEN - 1 VIEW COMPARISON:  None. FINDINGS: There is a nonobstructive bowel gas pattern. There is no abnormally large colonic stool burden. There is no definite free intraperitoneal air, suboptimally evaluated given supine technique. Right upper quadrant and midline upper abdominal surgical clips are noted. There is no gross organomegaly or abnormal soft tissue calcification. There is multilevel degenerative change of the lumbar spine and marked degenerative change of the left hip. IMPRESSION: Nonobstructive bowel gas pattern. No abnormally large colonic stool burden. Electronically Signed   By: Valetta Mole M.D.   On: 10/20/2021 10:05    Assessment/Plan  1. Type 2 diabetes mellitus with diabetic neuropathy, with long-term current use of insulin (HCC) Lab Results  Component Value Date   HGBA1C 6.5 (H) 10/12/2021   -  BPs stable, continue insulin glargine 20 units daily  2. Essential hypertension -  BPs stable, continue hydralazine, amlodipine and benazepril -    Monitor BPs  3. Closed fracture of ankle with routine healing, unspecified laterality, subsequent encounter -Orthopedics was consulted and recommended none operative management -   Was restarted on bilateral Cam boots today -    WBAT -    Continue PT and OT, for therapeutic and strengthening exercises - oxyCODONE (ROXICODONE) 5 MG immediate release tablet; Take 1 tablet (5 mg total) by mouth every 8 (eight) hours as needed for severe pain.  Dispense: 30 tablet; Refill: 0  4. Paroxysmal atrial fibrillation (HCC) -   Rate controlled, continue Eliquis for anticoagulation and metoprolol succinate ER for rate control   Family/ staff Communication: Discussed plan of care with resident.  Labs/tests  ordered:   None  Goals of care:   Short-term care   Durenda Age, DNP, MSN, FNP-BC [facility] [phone] (Monday-Friday 8:00 a.m. - 5:00 p.m.) [phone] (after hours)

## 2021-11-08 ENCOUNTER — Encounter: Payer: Self-pay | Admitting: Adult Health

## 2021-11-08 ENCOUNTER — Non-Acute Institutional Stay (SKILLED_NURSING_FACILITY): Payer: Medicare Other | Admitting: Adult Health

## 2021-11-08 DIAGNOSIS — I10 Essential (primary) hypertension: Secondary | ICD-10-CM | POA: Diagnosis not present

## 2021-11-08 DIAGNOSIS — E1165 Type 2 diabetes mellitus with hyperglycemia: Secondary | ICD-10-CM

## 2021-11-08 NOTE — Progress Notes (Signed)
Location:  [facility] Room Number: 306-B Place of Service:  SNF (31) Provider:  Kenard Gower, DNP, FNP-BC  Patient Care Team: [name], MD as PCP - General (Family Medicine) [name], Deloris Ping, MD as PCP - Cardiology (Cardiology) Glendale Chard, DO as Consulting Physician (Neurology)  Extended Emergency Contact Information Primary Emergency Contact: Kai Levins Address: [address]          [place], [place] [address] [place] of [place] Home Phone: [phone] Relation: Spouse Secondary Emergency Contact: DENNIS,SHERRIE Mobile Phone: [phone] Relation: Daughter  Code Status:  FULL CODE  Goals of care: Advanced Directive information Advanced Directives 11/08/2021  Does Patient Have a Medical Advance Directive? No  Type of Advance Directive -  Would patient like information on creating a medical advance directive? No - Patient declined     Chief Complaint  Patient presents with   Acute Visit    Elevated CBG's.    HPI:  Pt is a 87 y.o. female seen today for an acute visit.She is a short-term care resident of [facility].  CBGs ranging from 1 87-435, with outlier 165.  She takes insulin glargine 20 units subcutaneously daily for diabetes mellitus. SBPs ranging 150-168, with an outlier 100.  She states amlodipine 5 mg daily, hydralazine 25 mg 3 times a day, benazepril 10 mg daily, metoprolol succinate ER 25 mg daily for hypertension.  She is currently having PT and OT status post mechanical fall sustaining right ankle bimalleolar acute displaced fracture and left ankle indeterminate nondisplaced distal fibular fracture.  Orthopedics recommended nonoperative management and placed on bilateral cam boot.   Past Medical History:  Diagnosis Date   Arthritis    Coronary artery disease 1999   CABG   Depression    Diabetes mellitus without complication (HCC)    type II; metformin and Amaryl   Diabetic  neuropathy (HCC)    History of anemia as a child    HOH (hard of hearing)    Hypertension    Hypothyroidism    synthroid   PONV (postoperative nausea and vomiting)    Restless leg syndrome    S/P cardiac cath 08/13/18 stable CAD 08/14/2018   Shortness of breath 09/03/12   ECHO- The LA is Moderately Dilated, mild mitral annular calcification. mild pulmonary hypertension, moderate concentric LV hypertrophy. Atrial septum is aneurysmal. Aortic valve appears to be mildly sclerotic.EF 88%   Urinary incontinence    Weakness of both legs    Past Surgical History:  Procedure Laterality Date   ABDOMINAL HYSTERECTOMY     APPENDECTOMY     BACK SURGERY     CARDIAC CATHERIZATION  08/20/03   Widely patent bypass grafts. Normal left ventricular systolic function.   CARDIAC CATHETERIZATION     CHOLECYSTECTOMY     COLONOSCOPY     CORONARY ARTERY BYPASS GRAFT     EYE SURGERY     bilateral cataracts   LEFT HEART CATH AND CORONARY ANGIOGRAPHY N/A 08/13/2018   Procedure: LEFT HEART CATH AND CORONARY ANGIOGRAPHY;  Surgeon: Kathleene Hazel, MD;  Location: [facility] INVASIVE CV LAB;  Service: Cardiovascular;  Laterality: N/A;   LUMBAR LAMINECTOMY/DECOMPRESSION MICRODISCECTOMY N/A 07/26/2015   Procedure: BILATERAL Lumbar 3-4 SUBTOTAL HEMILAMINECTOMY WITH LATERAL RECESS DECOMPRESSION;  Surgeon: Kerrin Champagne, MD;  Location: [facility] OR;  Service: Orthopedics;  Laterality: N/A;   SHOULDER SURGERY     TUBAL LIGATION      Allergies  Allergen Reactions   Duloxetine Hcl Nausea And  Vomiting and Other (See Comments)   Statins Other (See Comments)    Causes myalgias Other reaction(s): Unknown   Amitriptyline Other (See Comments)    Pt. States that it caused it to be suicidal.    Benadryl [Diphenhydramine Hcl (Sleep)] Other (See Comments)    Makes the patient very "hyper"   Ezetimibe     Other reaction(s): Unknown Other reaction(s): Unknown   Gabapentin Other (See Comments)   Metformin Hcl     Other  reaction(s): Unknown Other reaction(s): Unknown   Other     Other reaction(s): Unknown   Sitagliptin     Other reaction(s): Unknown Other reaction(s): Unknown   Tylenol [Acetaminophen] Nausea Only   Colesevelam Hcl Other (See Comments)    Causes dizziness Other reaction(s): Unknown    Outpatient Encounter Medications as of 11/08/2021  Medication Sig   amLODipine (NORVASC) 5 MG tablet Take 1 tablet by mouth once daily   apixaban (ELIQUIS) 5 MG TABS tablet Take 1 tablet (5 mg total) by mouth 2 (two) times daily.   Ascorbic Acid (VITAMIN C) 1000 MG tablet Take 1,000 mg by mouth daily.   benazepril (LOTENSIN) 10 MG tablet Take 1 tablet (10 mg total) by mouth daily.   bisacodyl (DULCOLAX) 10 MG suppository Place 10 mg rectally as needed for moderate constipation.   carbidopa-levodopa (SINEMET IR) 25-100 MG tablet Take 1.5 tablets by mouth at bedtime.   cholecalciferol (VITAMIN D3) 25 MCG (1000 UNIT) tablet Take 1,000 Units by mouth daily.   furosemide (LASIX) 40 MG tablet Take 1 tablet (40 mg total) by mouth daily.   hydrALAZINE (APRESOLINE) 25 MG tablet TAKE 1 TABLET BY MOUTH THREE TIMES DAILY   isosorbide mononitrate (IMDUR) 30 MG 24 hr tablet Take 1 tablet by mouth once daily   LANTUS SOLOSTAR 100 UNIT/ML Solostar Pen Inject 20 Units into the skin daily.   levothyroxine (SYNTHROID, LEVOTHROID) 100 MCG tablet Take 100 mcg by mouth daily before breakfast.   Magnesium Hydroxide (MILK OF MAGNESIA PO) Take 30 mLs by mouth as needed.   methocarbamol (ROBAXIN) 500 MG tablet Take 500 mg by mouth every 8 (eight) hours as needed for muscle spasms. X 3 days and on 10/27/21 change to every 12 hours as needed for spasms x 5 days   metoprolol succinate (TOPROL-XL) 25 MG 24 hr tablet Take 1 tablet by mouth once daily   nitroGLYCERIN (NITROSTAT) 0.4 MG SL tablet DISSOLVE ONE TABLET UNDER THE TONGUE EVERY 5 MINUTES AS NEEDED FOR CHEST PAIN.   nystatin (MYCOSTATIN/NYSTOP) powder APPLY TOPICALLY TO BUTTOCKS  AND GROIN FOLLOWED BY A THIN LAYER OF BARRIER CREAM TWICE A DAY   ondansetron (ZOFRAN) 4 MG tablet Take 4 mg by mouth every 6 (six) hours as needed for nausea.   oxyCODONE (ROXICODONE) 5 MG immediate release tablet Take 1 tablet (5 mg total) by mouth every 8 (eight) hours as needed for severe pain.   Oyster Shell Calcium 500 MG TABS TAKE 1 TABLET BY MOUTH ONCE DAILY FOR SUPPLEMENT   polyethylene glycol (MIRALAX / GLYCOLAX) 17 g packet Take 17 g by mouth daily.   pregabalin (LYRICA) 50 MG capsule Take 50 mg by mouth 2 (two) times daily.   senna-docusate (SENOKOT-S) 8.6-50 MG tablet Take 1 tablet by mouth 2 (two) times daily.   Sodium Phosphates (RA SALINE ENEMA RE) Place 1 Dose rectally as needed.   vitamin B-12 (CYANOCOBALAMIN) 1000 MCG tablet Take 1,000 mcg by mouth daily.   [DISCONTINUED] calcium carbonate (OS-CAL - DOSED IN  MG OF ELEMENTAL CALCIUM) 1250 (500 Ca) MG tablet Take 1 tablet by mouth daily.   No facility-administered encounter medications on file as of 11/08/2021.    Review of Systems  GENERAL: No change in appetite, no fatigue, no weight changes, no fever or chills  MOUTH and THROAT: Denies oral discomfort, gingival pain or bleeding RESPIRATORY: no cough, SOB, DOE, wheezing, hemoptysis CARDIAC: No chest pain, edema or palpitations GI: No abdominal pain, diarrhea, constipation, heart burn, nausea or vomiting GU: Denies dysuria, frequency, hematuria or discharge NEUROLOGICAL: Denies dizziness, syncope, numbness, or headache PSYCHIATRIC: Denies feelings of depression or anxiety. No report of hallucinations, insomnia, paranoia, or agitation   Immunization History  Administered Date(s) Administered   Influenza Split 09/13/2007, 09/02/2009, 09/02/2010, 09/03/2011, 08/19/2012, 09/22/2013, 08/29/2016, 08/14/2018   Influenza, High Dose Seasonal PF 08/14/2018, 09/30/2019   Influenza,inj,Quad PF,6+ Mos 11/09/2020   Influenza,inj,quad, With Preservative 08/05/2014, 10/18/2015    Janssen (J&J) SARS-COV-2 Vaccination 04/13/2020   PFIZER(Purple Top)SARS-COV-2 Vaccination 01/04/2021   Pneumococcal Conjugate-13 09/08/2008, 01/11/2015   Td 07/04/2007   Pertinent  Health Maintenance Due  Topic Date Due   OPHTHALMOLOGY EXAM  Never done   INFLUENZA VACCINE  07/03/2021   FOOT EXAM  01/25/2022   HEMOGLOBIN A1C  04/11/2022   DEXA SCAN  Completed   Fall Risk 10/21/2021 10/21/2021 10/22/2021 10/22/2021 10/24/2021  Falls in the past year? - - - - -  Was there an injury with Fall? - - - - -  Fall Risk Category Calculator - - - - -  Fall Risk Category - - - - -  Patient Fall Risk Level High fall risk High fall risk High fall risk High fall risk High fall risk  Patient at Risk for Falls Due to - - - - -  Fall risk Follow up - - - - -     Vitals:   11/08/21 1515  BP: (!) 134/58  Pulse: (!) 52  Resp: 20  Temp: (!) 97.3 F (36.3 C)  Weight: 220 lb 9.6 oz (100.1 kg)  Height: 5\' 6"  (1.676 m)   Body mass index is 35.61 kg/m.  Physical Exam  GENERAL APPEARANCE: Well nourished. In no acute distress. Obese. SKIN:  Skin is warm and dry.  MOUTH and THROAT: Lips are without lesions. Oral mucosa is moist and without lesions.  RESPIRATORY: Breathing is even & unlabored, BS CTAB CARDIAC: RRR, no murmur,no extra heart sounds, no edema GI: Abdomen soft, normal BS, no masses, no tenderness EXTREMITIES: Able to move x4 extremities NEUROLOGICAL: There is no tremor. Speech is clear. Alert and oriented X 3. PSYCHIATRIC:  Affect and behavior are appropriate  Labs reviewed: Recent Labs    10/13/21 0324 10/14/21 0339 10/20/21 0216 10/21/21 0156 10/22/21 0309 10/23/21 0551 10/24/21 1045 10/24/21 1111 10/27/21 0000  NA 133*   < > 131* 127* 128* 132* 136 136 137  K 4.4   < > 4.3 4.8 4.8 4.9 4.7 4.5 4.5  CL 106   < > 99 98 99 103 107  --  107  CO2 20*   < > 25 20* 20* 23 19*  --  24*  GLUCOSE 173*   < > 128* 193* 173* 175* 190*  --   --   BUN 23   < > 35* 53* 63* 50* 35*   --  26*  CREATININE 1.14*   < > 1.45* 2.02* 2.04* 1.51* 1.16*  --  0.9  CALCIUM 8.1*   < > 8.3* 8.0* 7.7* 7.9* 8.3*  --  8.7  MG 1.6*  --  1.8 1.9  --   --   --   --   --    < > = values in this interval not displayed.   Recent Labs    10/11/21 1815 10/24/21 1045  AST 14* 20  ALT 8 20  ALKPHOS 81 89  BILITOT 0.4 0.6  PROT 5.8* 4.9*  ALBUMIN 3.4* 2.2*   Recent Labs    10/22/21 0309 10/23/21 0551 10/24/21 1045 10/24/21 1111 10/27/21 0000  WBC 16.7* 11.4* 7.8  --  8.7  NEUTROABS 13.1* 8.2* 5.4  --   --   HGB 10.0* 10.7* 14.0 10.2* 10.9*  HCT 30.5* 32.3* 43.3 30.0* 34*  MCV 95.9 95.3 96.4  --   --   PLT 232 263 226  --  276   Lab Results  Component Value Date   TSH 3.277 08/11/2018   Lab Results  Component Value Date   HGBA1C 6.5 (H) 10/12/2021   Lab Results  Component Value Date   CHOL 149 08/12/2018   HDL 34 (L) 08/12/2018   LDLCALC 81 08/12/2018   TRIG 171 (H) 08/12/2018   CHOLHDL 4.4 08/12/2018    Significant Diagnostic Results in last 30 days:  DG Chest 2 View  Result Date: 10/24/2021 CLINICAL DATA:  Cough and chest pain beginning this morning. Shortness of breath. EXAM: CHEST - 2 VIEW COMPARISON:  10/20/2021 FINDINGS: Stable cardiomegaly.  Prior CABG. Increased diffuse interstitial prominence, suspicious for interstitial edema. No evidence of pulmonary consolidation or pleural effusion. IMPRESSION: Increased diffuse interstitial prominence, suspicious for interstitial edema. Stable cardiomegaly. Electronically Signed   By: Marlaine Hind M.D.   On: 10/24/2021 11:04   DG Pelvis 1-2 Views  Result Date: 10/11/2021 CLINICAL DATA:  Status post fall.  Pain EXAM: PELVIS - 1-2 VIEW COMPARISON:  None. FINDINGS: Markedly limited evaluation due to overlapping osseous structures and overlying soft tissues. There is no evidence of pelvic fracture or diastasis. No pelvic bone lesions are seen. Severe degenerative changes left hip. Degenerative changes of the sacroiliac  joints. No hip dislocation. Vascular calcification. IMPRESSION: 1. Markedly limited evaluation due to overlapping osseous structures and overlying soft tissues. 2. No definite acute displaced fracture, hip dislocation, or pelvic diastasis. Electronically Signed   By: Iven Finn M.D.   On: 10/11/2021 21:00   DG Ankle Complete Left  Result Date: 10/24/2021 CLINICAL DATA:  Bilateral ankle pain after fractures 3 weeks ago. EXAM: LEFT ANKLE COMPLETE - 3+ VIEW COMPARISON:  October 11, 2021. FINDINGS: Left ankle has been casted and immobilized. Distal left fibular fracture is not well visualized due to overlying cast. No new fracture or dislocation is noted. IMPRESSION: Status post casting and immobilization of distal left fibular fracture. Electronically Signed   By: Marijo Conception M.D.   On: 10/24/2021 12:17   DG Ankle Complete Left  Addendum Date: 10/11/2021   ADDENDUM REPORT: 10/11/2021 20:56 ADDENDUM: Findings should state no evidence of "severe" arthropathy or other focal bone abnormality. Findings should also mention diffuse decreased bone density. Electronically Signed   By: Iven Finn M.D.   On: 10/11/2021 20:56   Result Date: 10/11/2021 CLINICAL DATA:  fall pain EXAM: LEFT ANKLE COMPLETE - 3+ VIEW COMPARISON:  X-ray left tibia fibula 03/02/2005 FINDINGS: Age-indeterminate nondisplaced distal fibular fracture. There is no evidence of definite acute displaced fracture, dislocation, or joint effusion. Degenerative changes of the navicular noted. Plantar calcaneal spur. There is no evidence of arthropathy or other focal bone  abnormality. Mild lateral ankle subcutaneus soft tissue edema. Vascular calcifications. IMPRESSION: indeterminate nondisplaced distal fibular fracture. Associated lateral ankle subcutaneus soft tissue edema. Correlate with point tenderness to evaluate for acuity. Electronically Signed: By: Tish Frederickson M.D. On: 10/11/2021 20:53   DG Ankle Complete Right  Result Date:  10/24/2021 CLINICAL DATA:  Bilateral ankle pain after fractures 3 weeks ago. EXAM: RIGHT ANKLE - COMPLETE 3+ VIEW COMPARISON:  October 11, 2021. FINDINGS: Right ankle has been casted and immobilized. Minimally displaced distal fibular and medial malleolar fractures are again noted. No new fracture is noted. IMPRESSION: Status post casting and immobilization of distal right fibular and tibial fractures. Electronically Signed   By: Lupita Raider M.D.   On: 10/24/2021 12:19   DG Ankle Complete Right  Result Date: 10/11/2021 CLINICAL DATA:  Status fall EXAM: RIGHT ANKLE - COMPLETE 3+ VIEW COMPARISON:  None. FINDINGS: Diffusely decreased bone density. Acute inferiorly displaced medial malleolar fracture. Acute displaced distal fibular fracture. No definite posterior malleolar fracture. Vague cortical irregularity the medial talus with no definite acute displaced talar fracture. Degenerative changes of the midfoot. Plantar calcaneal spur. There is no evidence of severe arthropathy or other focal bone abnormality. Subcutaneus soft tissue edema of the ankle. Vascular clips overlie the soft tissues of the right distal leg. Vascular calcifications. IMPRESSION: 1. Bimalleolar acute displaced fracture. 2. Vague cortical irregularity the medial talus with no definite acute displaced talar fracture. 3. Diffusely decreased bone density. Electronically Signed   By: Tish Frederickson M.D.   On: 10/11/2021 20:57   CT ANKLE RIGHT WO CONTRAST  Result Date: 10/12/2021 CLINICAL DATA:  Fracture, ankle EXAM: CT OF THE RIGHT ANKLE WITHOUT CONTRAST TECHNIQUE: Multidetector CT imaging of the right ankle was performed according to the standard protocol. Multiplanar CT image reconstructions were also generated. COMPARISON:  Ankle radiograph 10/11/2021 FINDINGS: Bones/Joint/Cartilage There is a trimalleolar ankle fracture. Oblique distal fibular fracture has minimal 2 mm posterolateral displacement. Medial malleolar fracture has  minimal 1-2 mm placement. Posterior malleolar fracture has proximally 1 mm posterior displacement and involves approximately 15% of the articular surface. Tiny fracture of the anterolateral distal tibia consistent with anterior inferior tibiofibular ligament avulsion (series 6, image 42). There is a splint in place. The ankle mortise appears well aligned. Osteopenia. There is mild posterior subtalar, talonavicular, calcaneocuboid, navicular-cuneiform and diffuse tarsometatarsal joint osteoarthritis. Ligaments Suboptimally assessed by CT. Muscles and Tendons Generalized muscle atrophy.  No evidence of tendon entrapment. Soft tissues Extensive soft tissue swelling. IMPRESSION: Trimalleolar ankle fracture with minimal 1-2 mm displacement. The posterior malleolar fracture involves approximately 15% of the articular surface. Tiny additional fracture of the anterolateral distal tibia consistent with an anterior inferior tibiofibular ligament avulsion. Diffuse osteopenia. Generalized muscle atrophy. Mild hindfoot and midfoot osteoarthritis. Extensive soft tissue swelling. Electronically Signed   By: Caprice Renshaw M.D.   On: 10/12/2021 08:59   US RENAL  Result Date: 10/21/2021 CLINICAL DATA:  AK I EXAM: RENAL / URINARY TRACT ULTRASOUND COMPLETE COMPARISON:  CT abdomen pelvis 01/15/2018 FINDINGS: Right Kidney: Renal measurements: 9.9 x 4.5 x 4.3 cm = volume: 100 mL. Echogenicity within normal limits. There is a cyst measuring 1.3 cm. No solid mass. No hydronephrosis. Left Kidney: Renal measurements: 10.2 x 5.3 x 5.6 cm = volume: 160 mL. Echogenicity within normal limits. No mass or hydronephrosis visualized. Bladder: Appears normal for degree of bladder distention. Other: None. IMPRESSION: No acute finding in the bilateral kidneys. Electronically Signed   By: Adline Potter.D.  On: 10/21/2021 15:11   CT ANKLE LEFT WO CONTRAST  Result Date: 10/15/2021 CLINICAL DATA:  Fracture, ankle EXAM: CT OF THE LEFT ANKLE  WITHOUT CONTRAST TECHNIQUE: Multidetector CT imaging of the left ankle was performed according to the standard protocol. Multiplanar CT image reconstructions were also generated. COMPARISON:  Radiograph 10/11/2021 FINDINGS: Bones/Joint/Cartilage There is a minimally displaced distal fibular fracture, with 2 mm posterior displacement. There is an anterolateral distal tibia fracture consistent with an anterior inferior tibiofibular ligament avulsion. Diffuse osteopenia. No other additional fracture. There is no significant tibiotalar osteoarthritis. There is mild talonavicular and calcaneocuboid osteoarthritis. Ligaments Suboptimally assessed by CT. Muscles and Tendons And no evidence of tendon entrapment.  Generalized muscle atrophy. Soft tissues Lateral ankle soft tissue swelling. IMPRESSION: Minimally displaced distal fibula fracture. Anterior inferior tibiofibular ligament avulsion fracture of the distal tibia. Electronically Signed   By: Caprice RenshawJacob  Kahn M.D.   On: 10/15/2021 17:03   DG CHEST PORT 1 VIEW  Result Date: 10/20/2021 CLINICAL DATA:  Leukocytosis, constipation EXAM: PORTABLE CHEST 1 VIEW COMPARISON:  Chest radiograph 01/20/2020 FINDINGS: Median sternotomy wires and mediastinal surgical clips are again seen. The heart is at the upper limits of normal for size. There is calcified atherosclerotic plaque of the aortic arch. The mediastinal contours are normal. Linear opacities in the left mid lung and base likely reflect atelectasis or scar. There is no focal consolidation. There is no pulmonary edema. There is no pleural effusion or pneumothorax. There is degenerative change of the right shoulder. There is no acute osseous abnormality. IMPRESSION: 1. Left basilar atelectasis or scar. No radiographic evidence of acute cardiopulmonary process. 2. Unchanged borderline cardiomegaly. Electronically Signed   By: Lesia HausenPeter  Noone M.D.   On: 10/20/2021 10:04   DG Ankle Right Port  Result Date: 10/11/2021 CLINICAL  DATA:  Post reduction. EXAM: PORTABLE RIGHT ANKLE - 2 VIEW COMPARISON:  10/11/2021. FINDINGS: Examination is limited due to overlying casting material. There is diffusely decreased mineralization of the bones. There is redemonstration of an oblique fracture of the distal fibular shaft and medial malleolus and possible fracture of the posterior tibia. Alignment is unchanged and there is no dislocation. A moderate calcaneal spurs present. Soft tissue swelling is noted about the ankle. IMPRESSION: Fractures of the distal fibula and medial malleolus, and possible fracture of the posterior tibia. Alignment is unchanged. Electronically Signed   By: Thornell SartoriusLaura  Taylor M.D.   On: 10/11/2021 23:47   DG Abd Portable 1V  Result Date: 10/20/2021 CLINICAL DATA:  Leukocytosis, constipation EXAM: PORTABLE ABDOMEN - 1 VIEW COMPARISON:  None. FINDINGS: There is a nonobstructive bowel gas pattern. There is no abnormally large colonic stool burden. There is no definite free intraperitoneal air, suboptimally evaluated given supine technique. Right upper quadrant and midline upper abdominal surgical clips are noted. There is no gross organomegaly or abnormal soft tissue calcification. There is multilevel degenerative change of the lumbar spine and marked degenerative change of the left hip. IMPRESSION: Nonobstructive bowel gas pattern. No abnormally large colonic stool burden. Electronically Signed   By: Lesia HausenPeter  Noone M.D.   On: 10/20/2021 10:05    Assessment/Plan  1. Uncontrolled type 2 diabetes mellitus with hyperglycemia (HCC) Lab Results  Component Value Date   HGBA1C 6.5 (H) 10/12/2021   -  CBGs elevated -  will add NovoLog 5 units SQ BID for CBG >= 200 -    Continue insulin glargine 20 units subcutaneously daily -    Monitor CBGs  2. Uncontrolled hypertension -  BPs elevated -    We will increase amlodipine from 5 mg daily to 10 mg daily -   Hydralazine 25 mg 3 times a day, benazepril 10 mg daily metoprolol  succinate ER 25 mg daily     Family/ staff Communication: Discussed plan of care with resident and charge nurse.  Labs/tests ordered:   None  Goals of care:   Short-term care   Durenda Age, DNP, MSN, FNP-BC [facility] [phone] (Monday-Friday 8:00 a.m. - 5:00 p.m.) [phone] (after hours)

## 2021-11-16 ENCOUNTER — Encounter: Payer: Self-pay | Admitting: Adult Health

## 2021-11-16 ENCOUNTER — Non-Acute Institutional Stay (SKILLED_NURSING_FACILITY): Payer: Medicare Other | Admitting: Adult Health

## 2021-11-16 ENCOUNTER — Other Ambulatory Visit: Payer: Self-pay | Admitting: Adult Health

## 2021-11-16 DIAGNOSIS — R051 Acute cough: Secondary | ICD-10-CM | POA: Diagnosis not present

## 2021-11-16 DIAGNOSIS — E1140 Type 2 diabetes mellitus with diabetic neuropathy, unspecified: Secondary | ICD-10-CM

## 2021-11-16 DIAGNOSIS — Z794 Long term (current) use of insulin: Secondary | ICD-10-CM

## 2021-11-16 MED ORDER — PREGABALIN 50 MG PO CAPS: 50.0000 mg | ORAL_CAPSULE | Freq: Two times a day (BID) | ORAL | 0 refills | Status: DC

## 2021-11-16 NOTE — Progress Notes (Signed)
Location:  [facility] Room Number: 306B Place of Service:  SNF (31) Provider:  Kenard Gower, DNP, FNP-BC  Patient Care Team: [name], MD as PCP - General (Family Medicine) [name], Deloris Ping, MD as PCP - Cardiology (Cardiology) Glendale Chard, DO as Consulting Physician (Neurology)  Extended Emergency Contact Information Primary Emergency Contact: Kai Levins Address: [address]          [place], [place] [address] [place] of [place] Home Phone: [phone] Relation: Spouse Secondary Emergency Contact: DENNIS,SHERRIE Mobile Phone: [phone] Relation: Daughter  Code Status:  FULL CODE  Goals of care: Advanced Directive information Advanced Directives 11/16/2021  Does Patient Have a Medical Advance Directive? No  Type of Advance Directive -  Would patient like information on creating a medical advance directive? No - Patient declined     Chief Complaint  Patient presents with   Acute Visit    Acute Visit for Coughing.    HPI:  Pt is a 87 y.o. female seen today for an acute visit. She is a short-term care resident of [facility]. She was reported to have been coughing and nauseous. There was no reported fever nor shortness of breath. She denies having chills. CBGs ranging from 157-278, with outlier 315.  She takes NovoLog 5 units twice daily and insulin glargine 20 units daily for diabetes mellitus.   Past Medical History:  Diagnosis Date   Arthritis    Coronary artery disease 1999   CABG   Depression    Diabetes mellitus without complication (HCC)    type II; metformin and Amaryl   Diabetic neuropathy (HCC)    History of anemia as a child    HOH (hard of hearing)    Hypertension    Hypothyroidism    synthroid   PONV (postoperative nausea and vomiting)    Restless leg syndrome    S/P cardiac cath 08/13/18 stable CAD 08/14/2018   Shortness of breath 09/03/12   ECHO- The LA is Moderately  Dilated, mild mitral annular calcification. mild pulmonary hypertension, moderate concentric LV hypertrophy. Atrial septum is aneurysmal. Aortic valve appears to be mildly sclerotic.EF 88%   Urinary incontinence    Weakness of both legs    Past Surgical History:  Procedure Laterality Date   ABDOMINAL HYSTERECTOMY     APPENDECTOMY     BACK SURGERY     CARDIAC CATHERIZATION  08/20/03   Widely patent bypass grafts. Normal left ventricular systolic function.   CARDIAC CATHETERIZATION     CHOLECYSTECTOMY     COLONOSCOPY     CORONARY ARTERY BYPASS GRAFT     EYE SURGERY     bilateral cataracts   LEFT HEART CATH AND CORONARY ANGIOGRAPHY N/A 08/13/2018   Procedure: LEFT HEART CATH AND CORONARY ANGIOGRAPHY;  Surgeon: Kathleene Hazel, MD;  Location: [facility] INVASIVE CV LAB;  Service: Cardiovascular;  Laterality: N/A;   LUMBAR LAMINECTOMY/DECOMPRESSION MICRODISCECTOMY N/A 07/26/2015   Procedure: BILATERAL Lumbar 3-4 SUBTOTAL HEMILAMINECTOMY WITH LATERAL RECESS DECOMPRESSION;  Surgeon: Kerrin Champagne, MD;  Location: [facility] OR;  Service: Orthopedics;  Laterality: N/A;   SHOULDER SURGERY     TUBAL LIGATION      Allergies  Allergen Reactions   Duloxetine Hcl Nausea And Vomiting and Other (See Comments)   Statins Other (See Comments)    Causes myalgias Other reaction(s): Unknown   Amitriptyline Other (See Comments)    Pt. States that it caused it to be suicidal.    Benadryl [Diphenhydramine Hcl (  Sleep)] Other (See Comments)    Makes the patient very "hyper"   Ezetimibe     Other reaction(s): Unknown Other reaction(s): Unknown   Gabapentin Other (See Comments)   Metformin Hcl     Other reaction(s): Unknown Other reaction(s): Unknown   Other     Other reaction(s): Unknown   Sitagliptin     Other reaction(s): Unknown Other reaction(s): Unknown   Tylenol [Acetaminophen] Nausea Only   Colesevelam Hcl Other (See Comments)    Causes dizziness Other reaction(s): Unknown    Outpatient  Encounter Medications as of 11/16/2021  Medication Sig   amLODipine (NORVASC) 5 MG tablet Take 1 tablet by mouth once daily (Patient taking differently: 10 mg daily.)   apixaban (ELIQUIS) 5 MG TABS tablet Take 1 tablet (5 mg total) by mouth 2 (two) times daily.   Ascorbic Acid (VITAMIN C) 1000 MG tablet Take 1,000 mg by mouth daily.   benazepril (LOTENSIN) 10 MG tablet Take 1 tablet (10 mg total) by mouth daily.   bisacodyl (DULCOLAX) 10 MG suppository Place 10 mg rectally as needed for moderate constipation.   carbidopa-levodopa (SINEMET IR) 25-100 MG tablet Take 1.5 tablets by mouth at bedtime.   cholecalciferol (VITAMIN D3) 25 MCG (1000 UNIT) tablet Take 1,000 Units by mouth daily.   furosemide (LASIX) 40 MG tablet Take 1 tablet (40 mg total) by mouth daily.   hydrALAZINE (APRESOLINE) 25 MG tablet TAKE 1 TABLET BY MOUTH THREE TIMES DAILY   insulin aspart (NOVOLOG) 100 UNIT/ML injection Inject 5 Units into the skin. TWICE DAILY FOR CBG >200mg /dL   isosorbide mononitrate (IMDUR) 30 MG 24 hr tablet Take 1 tablet by mouth once daily   LANTUS SOLOSTAR 100 UNIT/ML Solostar Pen Inject 20 Units into the skin daily. (Patient taking differently: Inject 20 Units into the skin daily. PRIME PEN WITH 2 UNITS PRIOR TO EACH USE - DISCARD 28 DAYS AFTER OPENING) (USE SAFETY PEN NEEDLES))   levothyroxine (SYNTHROID, LEVOTHROID) 100 MCG tablet Take 100 mcg by mouth daily before breakfast.   Magnesium Hydroxide (MILK OF MAGNESIA PO) Take 30 mLs by mouth as needed.   methocarbamol (ROBAXIN) 500 MG tablet Take 500 mg by mouth every 8 (eight) hours as needed for muscle spasms. X 3 days and on 10/27/21 change to every 12 hours as needed for spasms x 5 days   metoprolol succinate (TOPROL-XL) 25 MG 24 hr tablet Take 1 tablet by mouth once daily   nitroGLYCERIN (NITROSTAT) 0.4 MG SL tablet DISSOLVE ONE TABLET UNDER THE TONGUE EVERY 5 MINUTES AS NEEDED FOR CHEST PAIN.   nystatin (MYCOSTATIN/NYSTOP) powder APPLY TOPICALLY  TO BUTTOCKS AND GROIN FOLLOWED BY A THIN LAYER OF BARRIER CREAM TWICE A DAY   ondansetron (ZOFRAN) 4 MG tablet Take 4 mg by mouth every 6 (six) hours as needed for nausea.   oxyCODONE (ROXICODONE) 5 MG immediate release tablet Take 1 tablet (5 mg total) by mouth every 8 (eight) hours as needed for severe pain.   Oyster Shell Calcium 500 MG TABS TAKE 1 TABLET BY MOUTH ONCE DAILY FOR SUPPLEMENT   polyethylene glycol (MIRALAX / GLYCOLAX) 17 g packet Take 17 g by mouth daily.   pregabalin (LYRICA) 50 MG capsule Take 1 capsule (50 mg total) by mouth 2 (two) times daily.   senna-docusate (SENOKOT-S) 8.6-50 MG tablet Take 1 tablet by mouth 2 (two) times daily.   Sodium Phosphates (RA SALINE ENEMA RE) Place 1 Dose rectally as needed.   vitamin B-12 (CYANOCOBALAMIN) 1000 MCG tablet Take  1,000 mcg by mouth daily.   No facility-administered encounter medications on file as of 11/16/2021.    Review of Systems  GENERAL: No change in appetite, no fatigue, no weight changes, no fever or chills  MOUTH and THROAT: Denies oral discomfort, gingival pain or bleeding RESPIRATORY: + cough CARDIAC: No chest pain, edema or palpitations GI: No abdominal pain, diarrhea, constipation, heart burn, nausea or vomiting GU: Denies dysuria, frequency, hematuria or discharge NEUROLOGICAL: Denies dizziness, syncope, numbness, or headache PSYCHIATRIC: Denies feelings of depression or anxiety. No report of hallucinations, insomnia, paranoia, or agitation  Immunization History  Administered Date(s) Administered   Influenza Split 09/13/2007, 09/02/2009, 09/02/2010, 09/03/2011, 08/19/2012, 09/22/2013, 08/29/2016, 08/14/2018   Influenza, High Dose Seasonal PF 08/14/2018, 09/30/2019   Influenza,inj,Quad PF,6+ Mos 11/09/2020   Influenza,inj,quad, With Preservative 08/05/2014, 10/18/2015   Janssen (J&J) SARS-COV-2 Vaccination 04/13/2020   Pfizer Covid-19 Vaccine Bivalent Booster 19yrs & up 01/04/2021   Pneumococcal Conjugate-13  09/08/2008, 01/11/2015   Td 07/04/2007   Pertinent  Health Maintenance Due  Topic Date Due   OPHTHALMOLOGY EXAM  Never done   INFLUENZA VACCINE  07/03/2021   FOOT EXAM  01/25/2022   HEMOGLOBIN A1C  04/11/2022   DEXA SCAN  Completed   Fall Risk 10/21/2021 10/21/2021 10/22/2021 10/22/2021 10/24/2021  Falls in the past year? - - - - -  Was there an injury with Fall? - - - - -  Fall Risk Category Calculator - - - - -  Fall Risk Category - - - - -  Patient Fall Risk Level High fall risk High fall risk High fall risk High fall risk High fall risk  Patient at Risk for Falls Due to - - - - -  Fall risk Follow up - - - - -     Vitals:   11/16/21 1032  BP: 127/67  Pulse: (!) 57  Resp: 16  Temp: (!) 97.4 F (36.3 C)  Weight: 222 lb 6.4 oz (100.9 kg)  Height: 5\' 6"  (1.676 m)   Body mass index is 35.9 kg/m.  Physical Exam  GENERAL APPEARANCE: Well nourished. In no acute distress. Obese. SKIN:  Skin is warm and dry.  MOUTH and THROAT: Lips are without lesions. Oral mucosa is moist and without lesions.  RESPIRATORY: Breathing is even & unlabored, BS CTAB CARDIAC: RRR, no murmur,no extra heart sounds GI: Abdomen soft, normal BS, no masses, no tenderness EXTREMITIES:  Bilateral CAM boots. NEUROLOGICAL: There is no tremor. Speech is clear. Alert and oriented X 3. PSYCHIATRIC:  Affect and behavior are appropriate  Labs reviewed: Recent Labs    10/13/21 0324 10/14/21 0339 10/20/21 0216 10/21/21 0156 10/22/21 0309 10/23/21 0551 10/24/21 1045 10/24/21 1111 10/27/21 0000  NA 133*   < > 131* 127* 128* 132* 136 136 137  K 4.4   < > 4.3 4.8 4.8 4.9 4.7 4.5 4.5  CL 106   < > 99 98 99 103 107  --  107  CO2 20*   < > 25 20* 20* 23 19*  --  24*  GLUCOSE 173*   < > 128* 193* 173* 175* 190*  --   --   BUN 23   < > 35* 53* 63* 50* 35*  --  26*  CREATININE 1.14*   < > 1.45* 2.02* 2.04* 1.51* 1.16*  --  0.9  CALCIUM 8.1*   < > 8.3* 8.0* 7.7* 7.9* 8.3*  --  8.7  MG 1.6*  --  1.8 1.9   --   --   --   --   --    < > =  values in this interval not displayed.   Recent Labs    10/11/21 1815 10/24/21 1045  AST 14* 20  ALT 8 20  ALKPHOS 81 89  BILITOT 0.4 0.6  PROT 5.8* 4.9*  ALBUMIN 3.4* 2.2*   Recent Labs    10/22/21 0309 10/23/21 0551 10/24/21 1045 10/24/21 1111 10/27/21 0000  WBC 16.7* 11.4* 7.8  --  8.7  NEUTROABS 13.1* 8.2* 5.4  --   --   HGB 10.0* 10.7* 14.0 10.2* 10.9*  HCT 30.5* 32.3* 43.3 30.0* 34*  MCV 95.9 95.3 96.4  --   --   PLT 232 263 226  --  276   Lab Results  Component Value Date   TSH 3.277 08/11/2018   Lab Results  Component Value Date   HGBA1C 6.5 (H) 10/12/2021   Lab Results  Component Value Date   CHOL 149 08/12/2018   HDL 34 (L) 08/12/2018   LDLCALC 81 08/12/2018   TRIG 171 (H) 08/12/2018   CHOLHDL 4.4 08/12/2018    Significant Diagnostic Results in last 30 days:  DG Chest 2 View  Result Date: 10/24/2021 CLINICAL DATA:  Cough and chest pain beginning this morning. Shortness of breath. EXAM: CHEST - 2 VIEW COMPARISON:  10/20/2021 FINDINGS: Stable cardiomegaly.  Prior CABG. Increased diffuse interstitial prominence, suspicious for interstitial edema. No evidence of pulmonary consolidation or pleural effusion. IMPRESSION: Increased diffuse interstitial prominence, suspicious for interstitial edema. Stable cardiomegaly. Electronically Signed   By: Marlaine Hind M.D.   On: 10/24/2021 11:04   DG Ankle Complete Left  Result Date: 10/24/2021 CLINICAL DATA:  Bilateral ankle pain after fractures 3 weeks ago. EXAM: LEFT ANKLE COMPLETE - 3+ VIEW COMPARISON:  October 11, 2021. FINDINGS: Left ankle has been casted and immobilized. Distal left fibular fracture is not well visualized due to overlying cast. No new fracture or dislocation is noted. IMPRESSION: Status post casting and immobilization of distal left fibular fracture. Electronically Signed   By: Marijo Conception M.D.   On: 10/24/2021 12:17   DG Ankle Complete Right  Result Date:  10/24/2021 CLINICAL DATA:  Bilateral ankle pain after fractures 3 weeks ago. EXAM: RIGHT ANKLE - COMPLETE 3+ VIEW COMPARISON:  October 11, 2021. FINDINGS: Right ankle has been casted and immobilized. Minimally displaced distal fibular and medial malleolar fractures are again noted. No new fracture is noted. IMPRESSION: Status post casting and immobilization of distal right fibular and tibial fractures. Electronically Signed   By: Marijo Conception M.D.   On: 10/24/2021 12:19   US RENAL  Result Date: 10/21/2021 CLINICAL DATA:  AK I EXAM: RENAL / URINARY TRACT ULTRASOUND COMPLETE COMPARISON:  CT abdomen pelvis 01/15/2018 FINDINGS: Right Kidney: Renal measurements: 9.9 x 4.5 x 4.3 cm = volume: 100 mL. Echogenicity within normal limits. There is a cyst measuring 1.3 cm. No solid mass. No hydronephrosis. Left Kidney: Renal measurements: 10.2 x 5.3 x 5.6 cm = volume: 160 mL. Echogenicity within normal limits. No mass or hydronephrosis visualized. Bladder: Appears normal for degree of bladder distention. Other: None. IMPRESSION: No acute finding in the bilateral kidneys. Electronically Signed   By: Audie Pinto M.D.   On: 10/21/2021 15:11   DG CHEST PORT 1 VIEW  Result Date: 10/20/2021 CLINICAL DATA:  Leukocytosis, constipation EXAM: PORTABLE CHEST 1 VIEW COMPARISON:  Chest radiograph 01/20/2020 FINDINGS: Median sternotomy wires and mediastinal surgical clips are again seen. The heart is at the upper limits of normal for size. There is calcified atherosclerotic plaque of the  aortic arch. The mediastinal contours are normal. Linear opacities in the left mid lung and base likely reflect atelectasis or scar. There is no focal consolidation. There is no pulmonary edema. There is no pleural effusion or pneumothorax. There is degenerative change of the right shoulder. There is no acute osseous abnormality. IMPRESSION: 1. Left basilar atelectasis or scar. No radiographic evidence of acute cardiopulmonary process. 2.  Unchanged borderline cardiomegaly. Electronically Signed   By: Valetta Mole M.D.   On: 10/20/2021 10:04   DG Abd Portable 1V  Result Date: 10/20/2021 CLINICAL DATA:  Leukocytosis, constipation EXAM: PORTABLE ABDOMEN - 1 VIEW COMPARISON:  None. FINDINGS: There is a nonobstructive bowel gas pattern. There is no abnormally large colonic stool burden. There is no definite free intraperitoneal air, suboptimally evaluated given supine technique. Right upper quadrant and midline upper abdominal surgical clips are noted. There is no gross organomegaly or abnormal soft tissue calcification. There is multilevel degenerative change of the lumbar spine and marked degenerative change of the left hip. IMPRESSION: Nonobstructive bowel gas pattern. No abnormally large colonic stool burden. Electronically Signed   By: Valetta Mole M.D.   On: 10/20/2021 10:05    Assessment/Plan  1. Acute cough -   will start guaifenesin 100 mg / 5 mL give 10 mL = 200 mg p.o. every 6 AM, 2 PM and 10 PM x1 week  2. Type 2 diabetes mellitus with diabetic neuropathy, with long-term current use of insulin (HCC) Lab Results  Component Value Date   HGBA1C 6.5 (H) 10/12/2021   -   Continue insulin glargine 20 units daily and NovoLog 5 units SQ -   Continue CBG checks    Family/ staff Communication: Discussed plan of care with resident and charge nurse.  Labs/tests ordered:    COVID-19 test, CBC, BMP and chest x-ray PA and lateral  Goals of care:   Short-term care   Durenda Age, DNP, MSN, FNP-BC [facility] [phone] (Monday-Friday 8:00 a.m. - 5:00 p.m.) [phone] (after hours)

## 2021-11-20 DIAGNOSIS — E1142 Type 2 diabetes mellitus with diabetic polyneuropathy: Secondary | ICD-10-CM | POA: Diagnosis not present

## 2021-11-20 DIAGNOSIS — S82842D Displaced bimalleolar fracture of left lower leg, subsequent encounter for closed fracture with routine healing: Secondary | ICD-10-CM | POA: Diagnosis not present

## 2021-11-20 DIAGNOSIS — Q7291 Unspecified reduction defect of right lower limb: Secondary | ICD-10-CM | POA: Diagnosis not present

## 2021-11-20 DIAGNOSIS — Z741 Need for assistance with personal care: Secondary | ICD-10-CM | POA: Diagnosis not present

## 2021-11-20 DIAGNOSIS — Z9181 History of falling: Secondary | ICD-10-CM | POA: Diagnosis not present

## 2021-11-20 DIAGNOSIS — M6281 Muscle weakness (generalized): Secondary | ICD-10-CM | POA: Diagnosis not present

## 2021-11-20 DIAGNOSIS — S82841D Displaced bimalleolar fracture of right lower leg, subsequent encounter for closed fracture with routine healing: Secondary | ICD-10-CM | POA: Diagnosis not present

## 2021-11-20 DIAGNOSIS — R296 Repeated falls: Secondary | ICD-10-CM | POA: Diagnosis not present

## 2021-11-20 DIAGNOSIS — R2681 Unsteadiness on feet: Secondary | ICD-10-CM | POA: Diagnosis not present

## 2021-11-22 ENCOUNTER — Ambulatory Visit: Payer: Medicare Other | Admitting: Podiatry

## 2021-11-22 DIAGNOSIS — I10 Essential (primary) hypertension: Secondary | ICD-10-CM | POA: Diagnosis not present

## 2021-11-22 DIAGNOSIS — S82841D Displaced bimalleolar fracture of right lower leg, subsequent encounter for closed fracture with routine healing: Secondary | ICD-10-CM | POA: Diagnosis not present

## 2021-11-22 DIAGNOSIS — R296 Repeated falls: Secondary | ICD-10-CM | POA: Diagnosis not present

## 2021-11-22 DIAGNOSIS — E1140 Type 2 diabetes mellitus with diabetic neuropathy, unspecified: Secondary | ICD-10-CM | POA: Diagnosis not present

## 2021-11-22 DIAGNOSIS — M6281 Muscle weakness (generalized): Secondary | ICD-10-CM | POA: Diagnosis not present

## 2021-11-22 DIAGNOSIS — Z9181 History of falling: Secondary | ICD-10-CM | POA: Diagnosis not present

## 2021-11-22 LAB — CBC AND DIFFERENTIAL
HCT: 41 (ref 36–46)
Hemoglobin: 13.4 (ref 12.0–16.0)
Platelets: 236 (ref 150–399)
WBC: 10.9

## 2021-11-22 LAB — BASIC METABOLIC PANEL
BUN: 20 (ref 4–21)
CO2: 25 — AB (ref 13–22)
Chloride: 99 (ref 99–108)
Creatinine: 1.1 (ref 0.5–1.1)
Glucose: 138
Potassium: 3.9 (ref 3.4–5.3)
Sodium: 135 — AB (ref 137–147)

## 2021-11-22 LAB — CBC: RBC: 4.42 (ref 3.87–5.11)

## 2021-11-22 LAB — COMPREHENSIVE METABOLIC PANEL: Calcium: 8.8 (ref 8.7–10.7)

## 2021-11-23 ENCOUNTER — Encounter: Payer: Self-pay | Admitting: Adult Health

## 2021-11-23 ENCOUNTER — Non-Acute Institutional Stay (SKILLED_NURSING_FACILITY): Payer: Medicare Other | Admitting: Adult Health

## 2021-11-23 ENCOUNTER — Ambulatory Visit: Payer: Medicare Other | Admitting: Orthopedic Surgery

## 2021-11-23 DIAGNOSIS — E1140 Type 2 diabetes mellitus with diabetic neuropathy, unspecified: Secondary | ICD-10-CM | POA: Diagnosis not present

## 2021-11-23 DIAGNOSIS — Z794 Long term (current) use of insulin: Secondary | ICD-10-CM | POA: Diagnosis not present

## 2021-11-23 DIAGNOSIS — J189 Pneumonia, unspecified organism: Secondary | ICD-10-CM | POA: Diagnosis not present

## 2021-11-23 DIAGNOSIS — R112 Nausea with vomiting, unspecified: Secondary | ICD-10-CM | POA: Diagnosis not present

## 2021-11-23 MED ORDER — CEFTRIAXONE SODIUM 1 G IJ SOLR: 1.0000 g | Freq: Once | INTRAMUSCULAR | 0 refills | Status: AC

## 2021-11-29 DIAGNOSIS — S82841D Displaced bimalleolar fracture of right lower leg, subsequent encounter for closed fracture with routine healing: Secondary | ICD-10-CM | POA: Diagnosis not present

## 2021-11-29 DIAGNOSIS — E1140 Type 2 diabetes mellitus with diabetic neuropathy, unspecified: Secondary | ICD-10-CM | POA: Diagnosis not present

## 2021-11-29 DIAGNOSIS — Z9181 History of falling: Secondary | ICD-10-CM | POA: Diagnosis not present

## 2021-11-29 DIAGNOSIS — I10 Essential (primary) hypertension: Secondary | ICD-10-CM | POA: Diagnosis not present

## 2021-11-29 DIAGNOSIS — R296 Repeated falls: Secondary | ICD-10-CM | POA: Diagnosis not present

## 2021-11-29 DIAGNOSIS — M6281 Muscle weakness (generalized): Secondary | ICD-10-CM | POA: Diagnosis not present

## 2021-11-30 ENCOUNTER — Other Ambulatory Visit: Payer: Self-pay

## 2021-11-30 ENCOUNTER — Ambulatory Visit: Payer: Self-pay

## 2021-11-30 ENCOUNTER — Other Ambulatory Visit: Payer: Self-pay | Admitting: Adult Health

## 2021-11-30 ENCOUNTER — Ambulatory Visit (INDEPENDENT_AMBULATORY_CARE_PROVIDER_SITE_OTHER): Payer: Medicare Other | Admitting: Orthopedic Surgery

## 2021-11-30 DIAGNOSIS — S82892D Other fracture of left lower leg, subsequent encounter for closed fracture with routine healing: Secondary | ICD-10-CM | POA: Diagnosis not present

## 2021-11-30 DIAGNOSIS — M25571 Pain in right ankle and joints of right foot: Secondary | ICD-10-CM | POA: Diagnosis not present

## 2021-11-30 DIAGNOSIS — M25572 Pain in left ankle and joints of left foot: Secondary | ICD-10-CM | POA: Diagnosis not present

## 2021-11-30 DIAGNOSIS — S82891A Other fracture of right lower leg, initial encounter for closed fracture: Secondary | ICD-10-CM

## 2021-11-30 MED ORDER — PREGABALIN 50 MG PO CAPS: 50.0000 mg | ORAL_CAPSULE | Freq: Two times a day (BID) | ORAL | 0 refills | Status: DC

## 2021-12-01 ENCOUNTER — Other Ambulatory Visit: Payer: Self-pay | Admitting: Adult Health

## 2021-12-01 DIAGNOSIS — I10 Essential (primary) hypertension: Secondary | ICD-10-CM | POA: Diagnosis not present

## 2021-12-01 DIAGNOSIS — Z9181 History of falling: Secondary | ICD-10-CM | POA: Diagnosis not present

## 2021-12-01 DIAGNOSIS — S82899D Other fracture of unspecified lower leg, subsequent encounter for closed fracture with routine healing: Secondary | ICD-10-CM

## 2021-12-01 DIAGNOSIS — M6281 Muscle weakness (generalized): Secondary | ICD-10-CM | POA: Diagnosis not present

## 2021-12-01 DIAGNOSIS — S82841D Displaced bimalleolar fracture of right lower leg, subsequent encounter for closed fracture with routine healing: Secondary | ICD-10-CM | POA: Diagnosis not present

## 2021-12-01 DIAGNOSIS — E1140 Type 2 diabetes mellitus with diabetic neuropathy, unspecified: Secondary | ICD-10-CM | POA: Diagnosis not present

## 2021-12-01 DIAGNOSIS — R296 Repeated falls: Secondary | ICD-10-CM | POA: Diagnosis not present

## 2021-12-01 MED ORDER — OXYCODONE HCL 5 MG PO TABS: 5.0000 mg | ORAL_TABLET | Freq: Three times a day (TID) | ORAL | 0 refills | Status: DC | PRN

## 2021-12-01 NOTE — Progress Notes (Signed)
Location:  [facility] Room Number: 306-A Place of Service:  SNF (31) Provider:  Durenda Age, DNP, FNP-BC  Patient Care Team: [name], MD as PCP - General (Family Medicine) [name], [name], MD as PCP - Cardiology (Cardiology) [name], DO as Consulting Physician (Neurology)  Extended Emergency Contact Information Primary Emergency Contact: [name] Address: [address]          [place], [place] [address] [name] of [place] Phone: [phone] Relation: Spouse Secondary Emergency Contact: [place] Phone: [phone] Relation: Daughter  Code Status:  Full Code  Goals of care: Advanced Directive information Advanced Directives 11/23/2021  Does Patient Have a Medical Advance Directive? No  Type of Advance Directive -  Would patient like information on creating a medical advance directive? No - Patient declined     Chief Complaint  Patient presents with   Acute Visit    Nausea and vomiting    HPI:  Pt is a 87 y.o. female seen today for an acute visit for nausea and vomiting. Charge nurse reported that she has vomited 2X today. Patient stated that she gets nauseated by just looking at the food.She is currently taking Doxycycline for pneumonia. Chest x-ray done on 11/20/21 showed trace left base effusion and/or infiltrate. CBGs ranging from 136 to 292, with outlier 374. She takes NovoLog 5 units SQ twice a day for CBG > 200 and insulin glargine 20 units SQ daily..   Past Medical History:  Diagnosis Date   Arthritis    Coronary artery disease 1999   CABG   Depression    Diabetes mellitus without complication ([place])    type II; metformin and Amaryl   Diabetic neuropathy ([place])    History of anemia as a child    HOH (hard of hearing)    Hypertension    Hypothyroidism    synthroid   PONV (postoperative nausea and vomiting)    Restless leg syndrome    S/P cardiac cath 08/13/18 stable CAD  08/14/2018   Shortness of breath 09/03/12   ECHO- The LA is Moderately Dilated, mild mitral annular calcification. mild pulmonary hypertension, moderate concentric LV hypertrophy. Atrial septum is aneurysmal. Aortic valve appears to be mildly sclerotic.EF 88%   Urinary incontinence    Weakness of both legs    Past Surgical History:  Procedure Laterality Date   ABDOMINAL HYSTERECTOMY     APPENDECTOMY     BACK SURGERY     CARDIAC CATHERIZATION  08/20/03   Widely patent bypass grafts. Normal left ventricular systolic function.   CARDIAC CATHETERIZATION     CHOLECYSTECTOMY     COLONOSCOPY     CORONARY ARTERY BYPASS GRAFT     EYE SURGERY     bilateral cataracts   LEFT HEART CATH AND CORONARY ANGIOGRAPHY N/A 08/13/2018   Procedure: LEFT HEART CATH AND CORONARY ANGIOGRAPHY;  Surgeon: Burnell Blanks, MD;  Location: [facility];  Service: Cardiovascular;  Laterality: N/A;   LUMBAR LAMINECTOMY/DECOMPRESSION MICRODISCECTOMY N/A 07/26/2015   Procedure: BILATERAL Lumbar 3-4 SUBTOTAL HEMILAMINECTOMY WITH LATERAL RECESS DECOMPRESSION;  Surgeon: Jessy Oto, MD;  Location: [facility];  Service: Orthopedics;  Laterality: N/A;   SHOULDER SURGERY     TUBAL LIGATION      Allergies  Allergen Reactions   Duloxetine Hcl Nausea And Vomiting and Other (See Comments)   Statins Other (See Comments)    Causes myalgias Other reaction(s): Unknown   Amitriptyline Other (See Comments)    Pt.  States that it caused it to be suicidal.    Benadryl [Diphenhydramine Hcl (Sleep)] Other (See Comments)    Makes the patient very "hyper"   Ezetimibe     Other reaction(s): Unknown Other reaction(s): Unknown   Gabapentin Other (See Comments)   Metformin Hcl     Other reaction(s): Unknown Other reaction(s): Unknown   Other     Other reaction(s): Unknown   Sitagliptin     Other reaction(s): Unknown Other reaction(s): Unknown   Tylenol [Acetaminophen] Nausea Only   Colesevelam Hcl Other (See Comments)     Causes dizziness Other reaction(s): Unknown    Outpatient Encounter Medications as of 11/23/2021  Medication Sig   amLODipine (NORVASC) 10 MG tablet Take 10 mg by mouth daily.   apixaban (ELIQUIS) 5 MG TABS tablet Take 1 tablet (5 mg total) by mouth 2 (two) times daily.   Ascorbic Acid (VITAMIN C) 1000 MG tablet Take 1,000 mg by mouth daily.   benazepril (LOTENSIN) 10 MG tablet Take 1 tablet (10 mg total) by mouth daily.   bisacodyl (DULCOLAX) 10 MG suppository Place 10 mg rectally as needed for moderate constipation.   carbidopa-levodopa (SINEMET IR) 25-100 MG tablet Take 1.5 tablets by mouth at bedtime.   [EXPIRED] cefTRIAXone (ROCEPHIN) 1 g injection Inject 1 g into the muscle once for 1 dose.   cholecalciferol (VITAMIN D3) 25 MCG (1000 UNIT) tablet Take 1,000 Units by mouth daily.   furosemide (LASIX) 40 MG tablet Take 1 tablet (40 mg total) by mouth daily.   guaiFENesin (ROBITUSSIN) 100 MG/5ML liquid Take 10 mLs by mouth 3 (three) times daily.   hydrALAZINE (APRESOLINE) 25 MG tablet TAKE 1 TABLET BY MOUTH THREE TIMES DAILY   insulin aspart (NOVOLOG) 100 UNIT/ML injection Inject 5 Units into the skin. TWICE DAILY FOR CBG >200mg /dL   isosorbide mononitrate (IMDUR) 30 MG 24 hr tablet Take 1 tablet by mouth once daily   LANTUS SOLOSTAR 100 UNIT/ML Solostar Pen Inject 20 Units into the skin daily.   levothyroxine (SYNTHROID, LEVOTHROID) 100 MCG tablet Take 100 mcg by mouth daily before breakfast.   Magnesium Hydroxide (MILK OF MAGNESIA PO) Take 30 mLs by mouth as needed.   methocarbamol (ROBAXIN) 500 MG tablet Take 500 mg by mouth every 8 (eight) hours as needed for muscle spasms. X 3 days and on 10/27/21 change to every 12 hours as needed for spasms x 5 days   metoprolol succinate (TOPROL-XL) 25 MG 24 hr tablet Take 1 tablet by mouth once daily   nitroGLYCERIN (NITROSTAT) 0.4 MG SL tablet DISSOLVE ONE TABLET UNDER THE TONGUE EVERY 5 MINUTES AS NEEDED FOR CHEST PAIN.   NON FORMULARY  Diet:Reg CCD HH/thin diet consistency   nystatin (MYCOSTATIN/NYSTOP) powder APPLY TOPICALLY TO BUTTOCKS AND GROIN FOLLOWED BY A THIN LAYER OF BARRIER CREAM TWICE A DAY   ondansetron (ZOFRAN) 4 MG tablet Take 4 mg by mouth every 6 (six) hours as needed for nausea.   oxyCODONE (ROXICODONE) 5 MG immediate release tablet Take 1 tablet (5 mg total) by mouth every 8 (eight) hours as needed for severe pain.   Oyster Shell Calcium 500 MG TABS TAKE 1 TABLET BY MOUTH ONCE DAILY FOR SUPPLEMENT   polyethylene glycol (MIRALAX / GLYCOLAX) 17 g packet Take 17 g by mouth daily.   Saccharomyces boulardii (PROBIOTIC) 250 MG CAPS Take 250 mg by mouth 2 (two) times daily. 13 days   senna-docusate (SENOKOT-S) 8.6-50 MG tablet Take 1 tablet by mouth 2 (two) times daily.  Sodium Phosphates (RA SALINE ENEMA RE) Place 1 Dose rectally as needed.   vitamin B-12 (CYANOCOBALAMIN) 1000 MCG tablet Take 1,000 mcg by mouth daily.   [DISCONTINUED] DOXYCYCLINE HYCLATE PO Take 100 mg by mouth 2 (two) times daily. 10 days prophylaxis for Pna   [DISCONTINUED] pregabalin (LYRICA) 50 MG capsule Take 1 capsule (50 mg total) by mouth 2 (two) times daily.   [DISCONTINUED] amLODipine (NORVASC) 5 MG tablet Take 1 tablet by mouth once daily (Patient taking differently: 10 mg daily.)   No facility-administered encounter medications on file as of 11/23/2021.    Review of Systems  Constitutional:  Negative for chills and fever.  HENT:  Negative for congestion, hearing loss, rhinorrhea and sore throat.   Eyes: Negative.   Respiratory:  Positive for cough. Negative for shortness of breath and wheezing.   Cardiovascular:  Positive for leg swelling. Negative for chest pain and palpitations.  Gastrointestinal:  Positive for nausea and vomiting. Negative for abdominal pain, constipation and diarrhea.  Genitourinary:  Negative for difficulty urinating and dysuria.  Musculoskeletal:  Negative for arthralgias, back pain and myalgias.  Skin:   Negative for color change, rash and wound.  Neurological:  Negative for dizziness and headaches.  Psychiatric/Behavioral:  Negative for behavioral problems. The patient is not nervous/anxious.       Immunization History  Administered Date(s) Administered   Influenza Split 09/13/2007, 09/02/2009, 09/02/2010, 09/03/2011, 08/19/2012, 09/22/2013, 08/29/2016, 08/14/2018   Influenza, High Dose Seasonal PF 08/14/2018, 09/30/2019   Influenza,inj,Quad PF,6+ Mos 11/09/2020   Influenza,inj,quad, With Preservative 08/05/2014, 10/18/2015   Janssen (J&J) SARS-COV-2 Vaccination 04/13/2020   Pfizer Covid-19 Vaccine Bivalent Booster 63yrs & up 01/04/2021   Pneumococcal Conjugate-13 09/08/2008, 01/11/2015   Td 07/04/2007   Pertinent  Health Maintenance Due  Topic Date Due   OPHTHALMOLOGY EXAM  Never done   INFLUENZA VACCINE  07/03/2021   FOOT EXAM  01/25/2022   HEMOGLOBIN A1C  04/11/2022   DEXA SCAN  Completed   Fall Risk 10/21/2021 10/21/2021 10/22/2021 10/22/2021 10/24/2021  Falls in the past year? - - - - -  Was there an injury with Fall? - - - - -  Fall Risk Category Calculator - - - - -  Fall Risk Category - - - - -  Patient Fall Risk Level High fall risk High fall risk High fall risk High fall risk High fall risk  Patient at Risk for Falls Due to - - - - -  Fall risk Follow up - - - - -     Vitals:   11/23/21 1101  BP: 138/85  Pulse: 70  Resp: 18  Temp: (!) 97 F (36.1 C)  Weight: 222 lb 6.4 oz (100.9 kg)  Height: 5\' 6"  (1.676 m)   Body mass index is 35.9 kg/m.  Physical Exam Constitutional:      Appearance: Normal appearance.  HENT:     Head: Normocephalic and atraumatic.     Nose: Nose normal.     Mouth/Throat:     Mouth: Mucous membranes are moist.  Eyes:     Conjunctiva/sclera: Conjunctivae normal.  Cardiovascular:     Rate and Rhythm: Normal rate and regular rhythm.  Pulmonary:     Effort: Pulmonary effort is normal.     Breath sounds: Normal breath sounds.   Abdominal:     General: Bowel sounds are normal.     Palpations: Abdomen is soft.  Musculoskeletal:        General: Normal range of motion.  Cervical back: Normal range of motion.     Right lower leg: Edema present.     Left lower leg: Edema present.     Comments: RLE 2+ edema and LLE 1+edema Wears bilateral CAM boot  Skin:    General: Skin is warm and dry.  Neurological:     General: No focal deficit present.     Mental Status: She is alert and oriented to person, place, and time.  Psychiatric:        Mood and Affect: Mood normal.        Behavior: Behavior normal.        Thought Content: Thought content normal.        Judgment: Judgment normal.       Labs reviewed: Recent Labs    10/13/21 0324 10/14/21 0339 10/20/21 0216 10/21/21 0156 10/22/21 0309 10/23/21 0551 10/24/21 1045 10/24/21 1111 10/27/21 0000 11/22/21 0000  NA 133*   < > 131* 127* 128* 132* 136 136 137 135*  K 4.4   < > 4.3 4.8 4.8 4.9 4.7 4.5 4.5 3.9  CL 106   < > 99 98 99 103 107  --  107 99  CO2 20*   < > 25 20* 20* 23 19*  --  24* 25*  GLUCOSE 173*   < > 128* 193* 173* 175* 190*  --   --   --   BUN 23   < > 35* 53* 63* 50* 35*  --  26* 20  CREATININE 1.14*   < > 1.45* 2.02* 2.04* 1.51* 1.16*  --  0.9 1.1  CALCIUM 8.1*   < > 8.3* 8.0* 7.7* 7.9* 8.3*  --  8.7 8.8  MG 1.6*  --  1.8 1.9  --   --   --   --   --   --    < > = values in this interval not displayed.   Recent Labs    10/11/21 1815 10/24/21 1045  AST 14* 20  ALT 8 20  ALKPHOS 81 89  BILITOT 0.4 0.6  PROT 5.8* 4.9*  ALBUMIN 3.4* 2.2*   Recent Labs    10/22/21 0309 10/23/21 0551 10/24/21 1045 10/24/21 1111 10/27/21 0000 11/22/21 0000  WBC 16.7* 11.4* 7.8  --  8.7 10.9  NEUTROABS 13.1* 8.2* 5.4  --   --   --   HGB 10.0* 10.7* 14.0 10.2* 10.9* 13.4  HCT 30.5* 32.3* 43.3 30.0* 34* 41  MCV 95.9 95.3 96.4  --   --   --   PLT 232 263 226  --  276 236   Lab Results  Component Value Date   TSH 3.277 08/11/2018   Lab  Results  Component Value Date   HGBA1C 6.5 (H) 10/12/2021   Lab Results  Component Value Date   CHOL 149 08/12/2018   HDL 34 (L) 08/12/2018   LDLCALC 81 08/12/2018   TRIG 171 (H) 08/12/2018   CHOLHDL 4.4 08/12/2018    Significant Diagnostic Results in last 30 days:  No results found.  Assessment/Plan  1. Nausea and vomiting, unspecified vomiting type -  possibly due to Doxycycline, will change to Rocephin -Continue Zofran PRN  2. HCAP (healthcare-associated pneumonia) -  will discontinue Doxycycline and start Rocephine 1g IM daily X 5 days  3. Type 2 diabetes mellitus with diabetic neuropathy, with long-term current use of insulin (HCC) Lab Results  Component Value Date   HGBA1C 6.5 (H) 10/12/2021   -  CBGs stable,  continue Novolog and insulin glargine    Family/ staff Communication: Discussed plan of care with resident and charge nurse  Labs/tests ordered:  None    Durenda Age, DNP, MSN, FNP-BC [facility] [phone] (Monday-Friday 8:00 a.m. - 5:00 p.m.) [phone] (after hours)

## 2021-12-04 DIAGNOSIS — S82842D Displaced bimalleolar fracture of left lower leg, subsequent encounter for closed fracture with routine healing: Secondary | ICD-10-CM | POA: Diagnosis not present

## 2021-12-04 DIAGNOSIS — M6281 Muscle weakness (generalized): Secondary | ICD-10-CM | POA: Diagnosis not present

## 2021-12-05 ENCOUNTER — Encounter: Payer: Self-pay | Admitting: Orthopedic Surgery

## 2021-12-05 DIAGNOSIS — M6281 Muscle weakness (generalized): Secondary | ICD-10-CM | POA: Diagnosis not present

## 2021-12-05 DIAGNOSIS — S82842D Displaced bimalleolar fracture of left lower leg, subsequent encounter for closed fracture with routine healing: Secondary | ICD-10-CM | POA: Diagnosis not present

## 2021-12-05 NOTE — Progress Notes (Signed)
Office Visit Note   Patient: Sierra Wright           Date of Birth: 05/20/1934           MRN: [id number] Visit Date: 11/30/2021              Requested by: [name], MD [address] [place],  [place] [address] PCP: [name], MD  Chief Complaint  Patient presents with   Left Ankle - Follow-up    DOI 10/11/21 bilateral ankle fractures non operative management    Right Ankle - Follow-up      HPI: Patient is an 88 year old woman who is seen 7 weeks status post bilateral ankle fractures trimalleolar.  Assessment & Plan: Visit Diagnoses:  1. Acute bilateral ankle pain   2. Closed fracture of left ankle with routine healing, subsequent encounter   3. Closed fracture of right ankle, initial encounter     Plan: Patient currently only uses her legs for transfers.  It is okay for her to discontinue the fracture boots and continue therapy weightbearing for transfers only.  Follow-Up Instructions: Return if symptoms worsen or fail to improve.   Ortho Exam  Patient is alert, oriented, no adenopathy, well-dressed, normal affect, normal respiratory effort. Examination patient has a superficial eschar on the heel about 5 mm in diameter.  Patient was given instructions recommendations to float the heels to decrease pressure on the heel.  She has swelling in both lower extremities with venous insufficiency swelling on the right greater than the left.  There are no venous ulcers.  There is no malalignment of the ankles.  She has no pain with passive range of motion of the ankles.  No pain to palpation over the medial or lateral malleoli bilaterally.  Imaging: No results found. No images are attached to the encounter.  Labs: Lab Results  Component Value Date   HGBA1C 6.5 (H) 10/12/2021   HGBA1C 6.5 (H) 07/15/2016   HGBA1C 6.9 (H) 07/26/2015   REPTSTATUS 10/25/2021 FINAL 10/20/2021   CULT  10/20/2021    NO GROWTH 5 DAYS Performed at [facility], [address] [place], [place] [address]    LABORGA KLEBSIELLA PNEUMONIAE (A) 10/20/2021     Lab Results  Component Value Date   ALBUMIN 2.2 (L) 10/24/2021   ALBUMIN 3.4 (L) 10/11/2021   ALBUMIN 3.9 08/11/2018    Lab Results  Component Value Date   MG 1.9 10/21/2021   MG 1.8 10/20/2021   MG 1.6 (L) 10/13/2021   Lab Results  Component Value Date   VD25OH 37.60 10/12/2021    No results found for: PREALBUMIN CBC EXTENDED Latest Ref Rng & Units 11/22/2021 10/27/2021 10/24/2021  WBC - 10.9 8.7 -  RBC 3.87 - 5.11 4.42 3.48(A) -  HGB 12.0 - 16.0 13.4 10.9(A) 10.2(L)  HCT 36 - 46 41 34(A) 30.0(L)  PLT 150 - 399 236 276 -  NEUTROABS 1.7 - 7.7 K/uL - - -  LYMPHSABS 0.7 - 4.0 K/uL - - -     There is no height or weight on file to calculate BMI.  Orders:  Orders Placed This Encounter  Procedures   XR Ankle Complete Left   XR Ankle Complete Right   No orders of the defined types were placed in this encounter.    Procedures: No procedures performed  Clinical Data: No additional findings.  ROS:  All other systems negative, except as noted in the HPI. Review of  Systems  Objective: Vital Signs: There were no vitals taken for this visit.  Specialty Comments:  No specialty comments available.  PMFS History: Patient Active Problem List   Diagnosis Date Noted   Unspecified protein-calorie malnutrition ([place]) 10/27/2021   Leukocytosis 10/20/2021   Acute lower UTI 10/20/2021   Nausea    PVD (peripheral vascular disease) ([place])    Ankle fracture 10/12/2021   AKI (acute kidney injury) ([place]) 10/12/2021   Anemia 10/12/2021   Diabetic neuropathy ([place]) 01/25/2021   Diabetic retinopathy ([place]) 01/25/2021   Hyperglycemia due to type 2 diabetes mellitus ([place]) 01/25/2021   Unspecified mononeuropathy of right lower limb 01/25/2021   S/P cardiac cath 08/13/18 stable CAD 08/14/2018   Orthopnea 08/12/2018   Chest pain 09/01/2016   Diabetic polyneuropathy associated with  diabetes mellitus due to underlying condition ([place]) 07/26/2016   Chronic daily headache 07/26/2016   RLS (restless legs syndrome) 07/26/2016   Ulnar neuropathy of right upper extremity 07/26/2016   Ischemic stroke ([place]) 07/26/2016   CVA (cerebral infarction): Early subacute 07/14/2016   Back pain 07/14/2016   Essential hypertension    2nd degree AV block    Thyroid activity decreased    Coronary artery disease involving coronary bypass graft of native heart without angina pectoris    Binocular vision disorder with diplopia 07/13/2016   Weakness 07/13/2016   Chronic diastolic CHF (congestive heart failure) ([place]) 04/16/2016   Osteoporosis 07/27/2015    Class: Chronic   Spinal stenosis, lumbar region, with neurogenic claudication 07/26/2015    Class: Chronic   Spondylolisthesis of lumbar region 07/26/2015    Class: Chronic   Hx of CABG 03/15/2014   Osteoarthritis 03/15/2014   DM type 2 goal A1C below 7.5 03/15/2014   Peripheral edema 03/15/2014   Benign hypertensive heart disease without heart failure 03/15/2014   Right bundle branch block 03/15/2014   First degree AV block 03/15/2014   Sinus bradycardia by electrocardiogram 03/15/2014   Dizziness 03/15/2014   Past Medical History:  Diagnosis Date   Arthritis    Coronary artery disease 1999   CABG   Depression    Diabetes mellitus without complication ([place])    type II; metformin and Amaryl   Diabetic neuropathy ([place])    History of anemia as a child    HOH (hard of hearing)    Hypertension    Hypothyroidism    synthroid   PONV (postoperative nausea and vomiting)    Restless leg syndrome    S/P cardiac cath 08/13/18 stable CAD 08/14/2018   Shortness of breath 09/03/12   ECHO- The LA is Moderately Dilated, mild mitral annular calcification. mild pulmonary hypertension, moderate concentric LV hypertrophy. Atrial septum is aneurysmal. Aortic valve appears to be mildly sclerotic.EF 88%   Urinary incontinence    Weakness of both  legs     Family History  Problem Relation Age of Onset   Other Mother    Pneumonia Mother    Heart attack Father    Liver cancer Sister    Stroke Sister    Heart attack Brother 60   Stroke Son 32   Healthy Daughter     Past Surgical History:  Procedure Laterality Date   ABDOMINAL HYSTERECTOMY     APPENDECTOMY     BACK SURGERY     CARDIAC CATHERIZATION  08/20/03   Widely patent bypass grafts. Normal left ventricular systolic function.   CARDIAC CATHETERIZATION     CHOLECYSTECTOMY     COLONOSCOPY     CORONARY  ARTERY BYPASS GRAFT     EYE SURGERY     bilateral cataracts   LEFT HEART CATH AND CORONARY ANGIOGRAPHY N/A 08/13/2018   Procedure: LEFT HEART CATH AND CORONARY ANGIOGRAPHY;  Surgeon: Burnell Blanks, MD;  Location: [facility];  Service: Cardiovascular;  Laterality: N/A;   LUMBAR LAMINECTOMY/DECOMPRESSION MICRODISCECTOMY N/A 07/26/2015   Procedure: BILATERAL Lumbar 3-4 SUBTOTAL HEMILAMINECTOMY WITH LATERAL RECESS DECOMPRESSION;  Surgeon: Jessy Oto, MD;  Location: [facility];  Service: Orthopedics;  Laterality: N/A;   SHOULDER SURGERY     TUBAL LIGATION     Social History   Occupational History   Not on file  Tobacco Use   Smoking status: Never    Passive exposure: Never   Smokeless tobacco: Never  Vaping Use   Vaping Use: Never used  Substance and Sexual Activity   Alcohol use: No   Drug use: No   Sexual activity: Not Currently

## 2021-12-06 DIAGNOSIS — S82841D Displaced bimalleolar fracture of right lower leg, subsequent encounter for closed fracture with routine healing: Secondary | ICD-10-CM | POA: Diagnosis not present

## 2021-12-06 DIAGNOSIS — E1140 Type 2 diabetes mellitus with diabetic neuropathy, unspecified: Secondary | ICD-10-CM | POA: Diagnosis not present

## 2021-12-06 DIAGNOSIS — S82842D Displaced bimalleolar fracture of left lower leg, subsequent encounter for closed fracture with routine healing: Secondary | ICD-10-CM | POA: Diagnosis not present

## 2021-12-06 DIAGNOSIS — M6281 Muscle weakness (generalized): Secondary | ICD-10-CM | POA: Diagnosis not present

## 2021-12-06 DIAGNOSIS — R296 Repeated falls: Secondary | ICD-10-CM | POA: Diagnosis not present

## 2021-12-06 DIAGNOSIS — Z9181 History of falling: Secondary | ICD-10-CM | POA: Diagnosis not present

## 2021-12-06 DIAGNOSIS — I10 Essential (primary) hypertension: Secondary | ICD-10-CM | POA: Diagnosis not present

## 2021-12-07 DIAGNOSIS — M6281 Muscle weakness (generalized): Secondary | ICD-10-CM | POA: Diagnosis not present

## 2021-12-07 DIAGNOSIS — S82842D Displaced bimalleolar fracture of left lower leg, subsequent encounter for closed fracture with routine healing: Secondary | ICD-10-CM | POA: Diagnosis not present

## 2021-12-08 DIAGNOSIS — E119 Type 2 diabetes mellitus without complications: Secondary | ICD-10-CM | POA: Diagnosis not present

## 2021-12-08 DIAGNOSIS — S82842D Displaced bimalleolar fracture of left lower leg, subsequent encounter for closed fracture with routine healing: Secondary | ICD-10-CM | POA: Diagnosis not present

## 2021-12-08 DIAGNOSIS — D649 Anemia, unspecified: Secondary | ICD-10-CM | POA: Diagnosis not present

## 2021-12-08 DIAGNOSIS — M6281 Muscle weakness (generalized): Secondary | ICD-10-CM | POA: Diagnosis not present

## 2021-12-08 DIAGNOSIS — R197 Diarrhea, unspecified: Secondary | ICD-10-CM | POA: Diagnosis not present

## 2021-12-08 LAB — CBC AND DIFFERENTIAL
HCT: 40 (ref 36–46)
Hemoglobin: 12.6 (ref 12.0–16.0)
Neutrophils Absolute: 6.6
Platelets: 196 (ref 150–399)
WBC: 9.9

## 2021-12-08 LAB — BASIC METABOLIC PANEL
BUN: 22 — AB (ref 4–21)
CO2: 27 — AB (ref 13–22)
Chloride: 101 (ref 99–108)
Creatinine: 0.8 (ref 0.5–1.1)
Glucose: 152
Potassium: 3.6 (ref 3.4–5.3)
Sodium: 141 (ref 137–147)

## 2021-12-08 LAB — CBC: RBC: 4.22 (ref 3.87–5.11)

## 2021-12-08 LAB — COMPREHENSIVE METABOLIC PANEL: Calcium: 8.7 (ref 8.7–10.7)

## 2021-12-09 DIAGNOSIS — S82842D Displaced bimalleolar fracture of left lower leg, subsequent encounter for closed fracture with routine healing: Secondary | ICD-10-CM | POA: Diagnosis not present

## 2021-12-09 DIAGNOSIS — M6281 Muscle weakness (generalized): Secondary | ICD-10-CM | POA: Diagnosis not present

## 2021-12-11 DIAGNOSIS — S82842D Displaced bimalleolar fracture of left lower leg, subsequent encounter for closed fracture with routine healing: Secondary | ICD-10-CM | POA: Diagnosis not present

## 2021-12-11 DIAGNOSIS — Z9181 History of falling: Secondary | ICD-10-CM | POA: Diagnosis not present

## 2021-12-11 DIAGNOSIS — I10 Essential (primary) hypertension: Secondary | ICD-10-CM | POA: Diagnosis not present

## 2021-12-11 DIAGNOSIS — E1140 Type 2 diabetes mellitus with diabetic neuropathy, unspecified: Secondary | ICD-10-CM | POA: Diagnosis not present

## 2021-12-11 DIAGNOSIS — S82841D Displaced bimalleolar fracture of right lower leg, subsequent encounter for closed fracture with routine healing: Secondary | ICD-10-CM | POA: Diagnosis not present

## 2021-12-11 DIAGNOSIS — M6281 Muscle weakness (generalized): Secondary | ICD-10-CM | POA: Diagnosis not present

## 2021-12-11 DIAGNOSIS — R296 Repeated falls: Secondary | ICD-10-CM | POA: Diagnosis not present

## 2021-12-12 DIAGNOSIS — M6281 Muscle weakness (generalized): Secondary | ICD-10-CM | POA: Diagnosis not present

## 2021-12-12 DIAGNOSIS — S82842D Displaced bimalleolar fracture of left lower leg, subsequent encounter for closed fracture with routine healing: Secondary | ICD-10-CM | POA: Diagnosis not present

## 2021-12-13 DIAGNOSIS — M6281 Muscle weakness (generalized): Secondary | ICD-10-CM | POA: Diagnosis not present

## 2021-12-13 DIAGNOSIS — S82842D Displaced bimalleolar fracture of left lower leg, subsequent encounter for closed fracture with routine healing: Secondary | ICD-10-CM | POA: Diagnosis not present

## 2021-12-14 ENCOUNTER — Encounter: Payer: Self-pay | Admitting: Adult Health

## 2021-12-14 ENCOUNTER — Non-Acute Institutional Stay (SKILLED_NURSING_FACILITY): Payer: Medicare Other | Admitting: Adult Health

## 2021-12-14 DIAGNOSIS — Z23 Encounter for immunization: Secondary | ICD-10-CM

## 2021-12-14 DIAGNOSIS — E1140 Type 2 diabetes mellitus with diabetic neuropathy, unspecified: Secondary | ICD-10-CM | POA: Diagnosis not present

## 2021-12-14 DIAGNOSIS — I25810 Atherosclerosis of coronary artery bypass graft(s) without angina pectoris: Secondary | ICD-10-CM

## 2021-12-14 DIAGNOSIS — S82899D Other fracture of unspecified lower leg, subsequent encounter for closed fracture with routine healing: Secondary | ICD-10-CM | POA: Diagnosis not present

## 2021-12-14 DIAGNOSIS — E039 Hypothyroidism, unspecified: Secondary | ICD-10-CM | POA: Diagnosis not present

## 2021-12-14 DIAGNOSIS — M6281 Muscle weakness (generalized): Secondary | ICD-10-CM | POA: Diagnosis not present

## 2021-12-14 DIAGNOSIS — I10 Essential (primary) hypertension: Secondary | ICD-10-CM

## 2021-12-14 DIAGNOSIS — Z794 Long term (current) use of insulin: Secondary | ICD-10-CM | POA: Diagnosis not present

## 2021-12-14 DIAGNOSIS — S82842D Displaced bimalleolar fracture of left lower leg, subsequent encounter for closed fracture with routine healing: Secondary | ICD-10-CM | POA: Diagnosis not present

## 2021-12-14 NOTE — Progress Notes (Signed)
Location:  [facility] Room Number: 318 B Place of Service:  SNF (31) Provider:  Kenard GowerMedina-Vargas, Yarisbel Miranda, DNP, FNP-BC  Patient Care Team: Mila PalmerWolters, Sharon, MD as PCP - General (Family Medicine) [name], Deloris PingPhilip J, MD as PCP - Cardiology (Cardiology) Glendale ChardPatel, Donika K, DO as Consulting Physician (Neurology)  Extended Emergency Contact Information Primary Emergency Contact: Kai LevinsKendrick,Robert D Address: [address]          [place], [place] [address] [place] Home Phone: [phone] Relation: Spouse Secondary Emergency Contact: DENNIS,SHERRIE Mobile Phone: [phone] Relation: Daughter  Code Status:  Full code  Goals of care: Advanced Directive information Advanced Directives 11/23/2021  Does Patient Have a Medical Advance Directive? No  Type of Advance Directive -  Would patient like information on creating a medical advance directive? No - Patient declined     Chief Complaint  Patient presents with   Medical Management of Chronic Issues    Routine Visit    HPI:  Pt is a 88 y.o. female seen today for medical management of chronic diseases.  She is a long-term care resident of [facility].  She has a PMH of coronary artery disease/CABG, chronic diastolic heart failure, type 2 diabetes mellitus, CVA, hypertension, hypothyroidism and osteoarthritis.  Essential hypertension -  SBPs ranging from 108-138, takes hydralazine 25 mg 1 tab 3 times daily, benazepril 10 mg 1 tab daily, amlodipine 10 mg 1 tab daily and metoprolol succinate ER 25 mg 1 tab daily  Coronary artery disease involving coronary bypass graft of native heart without angina pectoris -   denies chest pains, takes NTG as needed and isosorbide mononitrate ER 30 mg 1 tab daily  Acquired hypothyroidism -takes levothyroxine 100 mcg 1 tab daily  Type 2 diabetes mellitus with diabetic neuropathy, with long-term current use of insulin (HCC) -  CBGs ranging from 167-350,  takes NovoLog 5 units subcutaneously twice daily for CBG > 200 and insulin glargine 20 units subcutaneously daily   Past Medical History:  Diagnosis Date   Arthritis    Coronary artery disease 1999   CABG   Depression    Diabetes mellitus without complication (HCC)    type II; metformin and Amaryl   Diabetic neuropathy (HCC)    History of anemia as a child    HOH (hard of hearing)    Hypertension    Hypothyroidism    synthroid   PONV (postoperative nausea and vomiting)    Restless leg syndrome    S/P cardiac cath 08/13/18 stable CAD 08/14/2018   Shortness of breath 09/03/12   ECHO- The LA is Moderately Dilated, mild mitral annular calcification. mild pulmonary hypertension, moderate concentric LV hypertrophy. Atrial septum is aneurysmal. Aortic valve appears to be mildly sclerotic.EF 88%   Urinary incontinence    Weakness of both legs    Past Surgical History:  Procedure Laterality Date   ABDOMINAL HYSTERECTOMY     APPENDECTOMY     BACK SURGERY     CARDIAC CATHERIZATION  08/20/03   Widely patent bypass grafts. Normal left ventricular systolic function.   CARDIAC CATHETERIZATION     CHOLECYSTECTOMY     COLONOSCOPY     CORONARY ARTERY BYPASS GRAFT     EYE SURGERY     bilateral cataracts   LEFT HEART CATH AND CORONARY ANGIOGRAPHY N/A 08/13/2018   Procedure: LEFT HEART CATH AND CORONARY ANGIOGRAPHY;  Surgeon: Kathleene HazelMcAlhany, Christopher D, MD;  Location: [facility] INVASIVE CV LAB;  Service: Cardiovascular;  Laterality: N/A;  LUMBAR LAMINECTOMY/DECOMPRESSION MICRODISCECTOMY N/A 07/26/2015   Procedure: BILATERAL Lumbar 3-4 SUBTOTAL HEMILAMINECTOMY WITH LATERAL RECESS DECOMPRESSION;  Surgeon: Kerrin Champagne, MD;  Location: [facility] OR;  Service: Orthopedics;  Laterality: N/A;   SHOULDER SURGERY     TUBAL LIGATION      Allergies  Allergen Reactions   Duloxetine Hcl Nausea And Vomiting and Other (See Comments)   Statins Other (See Comments)    Causes myalgias Other reaction(s): Unknown    Amitriptyline Other (See Comments)    Pt. States that it caused it to be suicidal.    Benadryl [Diphenhydramine Hcl (Sleep)] Other (See Comments)    Makes the patient very "hyper"   Ezetimibe     Other reaction(s): Unknown Other reaction(s): Unknown   Gabapentin Other (See Comments)   Metformin Hcl     Other reaction(s): Unknown Other reaction(s): Unknown   Other     Other reaction(s): Unknown   Sitagliptin     Other reaction(s): Unknown Other reaction(s): Unknown   Tylenol [Acetaminophen] Nausea Only   Colesevelam Hcl Other (See Comments)    Causes dizziness Other reaction(s): Unknown    Outpatient Encounter Medications as of 12/14/2021  Medication Sig   amLODipine (NORVASC) 10 MG tablet Take 10 mg by mouth daily.   apixaban (ELIQUIS) 5 MG TABS tablet Take 1 tablet (5 mg total) by mouth 2 (two) times daily.   Ascorbic Acid (VITAMIN C) 1000 MG tablet Take 1,000 mg by mouth daily.   benazepril (LOTENSIN) 10 MG tablet Take 1 tablet (10 mg total) by mouth daily.   bisacodyl (DULCOLAX) 10 MG suppository Place 10 mg rectally as needed for moderate constipation.   carbidopa-levodopa (SINEMET IR) 25-100 MG tablet Take 1.5 tablets by mouth at bedtime.   cholecalciferol (VITAMIN D3) 25 MCG (1000 UNIT) tablet Take 1,000 Units by mouth daily.   furosemide (LASIX) 40 MG tablet Take 1 tablet (40 mg total) by mouth daily.   guaiFENesin (ROBITUSSIN) 100 MG/5ML liquid Take 10 mLs by mouth 3 (three) times daily.   hydrALAZINE (APRESOLINE) 25 MG tablet TAKE 1 TABLET BY MOUTH THREE TIMES DAILY   insulin aspart (NOVOLOG) 100 UNIT/ML injection Inject 5 Units into the skin. TWICE DAILY FOR CBG >200mg /dL   isosorbide mononitrate (IMDUR) 30 MG 24 hr tablet Take 1 tablet by mouth once daily   LANTUS SOLOSTAR 100 UNIT/ML Solostar Pen Inject 20 Units into the skin daily.   levothyroxine (SYNTHROID, LEVOTHROID) 100 MCG tablet Take 100 mcg by mouth daily before breakfast.   Magnesium Hydroxide (MILK OF  MAGNESIA PO) Take 30 mLs by mouth as needed.   methocarbamol (ROBAXIN) 500 MG tablet Take 500 mg by mouth every 8 (eight) hours as needed for muscle spasms. X 3 days and on 10/27/21 change to every 12 hours as needed for spasms x 5 days   metoprolol succinate (TOPROL-XL) 25 MG 24 hr tablet Take 1 tablet by mouth once daily   nitroGLYCERIN (NITROSTAT) 0.4 MG SL tablet DISSOLVE ONE TABLET UNDER THE TONGUE EVERY 5 MINUTES AS NEEDED FOR CHEST PAIN.   NON FORMULARY Diet:Reg [facility] HH/thin diet consistency   nystatin (MYCOSTATIN/NYSTOP) powder APPLY TOPICALLY TO BUTTOCKS AND GROIN FOLLOWED BY A THIN LAYER OF BARRIER CREAM TWICE A DAY   ondansetron (ZOFRAN) 4 MG tablet Take 4 mg by mouth every 6 (six) hours as needed for nausea.   oxyCODONE (ROXICODONE) 5 MG immediate release tablet Take 1 tablet (5 mg total) by mouth every 8 (eight) hours as needed for severe pain.  Oyster Shell Calcium 500 MG TABS TAKE 1 TABLET BY MOUTH ONCE DAILY FOR SUPPLEMENT   polyethylene glycol (MIRALAX / GLYCOLAX) 17 g packet Take 17 g by mouth daily.   pregabalin (LYRICA) 50 MG capsule Take 1 capsule (50 mg total) by mouth 2 (two) times daily.   senna-docusate (SENOKOT-S) 8.6-50 MG tablet Take 1 tablet by mouth 2 (two) times daily.   Sodium Phosphates (RA SALINE ENEMA RE) Place 1 Dose rectally as needed.   vitamin B-12 (CYANOCOBALAMIN) 1000 MCG tablet Take 1,000 mcg by mouth daily.   No facility-administered encounter medications on file as of 12/14/2021.    Review of Systems  Constitutional:  Negative for appetite change, chills, fatigue and fever.  HENT:  Negative for congestion, hearing loss, rhinorrhea and sore throat.   Eyes: Negative.   Respiratory:  Negative for cough, shortness of breath and wheezing.   Cardiovascular:  Negative for chest pain, palpitations and leg swelling.  Gastrointestinal:  Negative for abdominal pain, constipation, diarrhea, nausea and vomiting.  Genitourinary:  Negative for dysuria.   Musculoskeletal:  Negative for arthralgias, back pain and myalgias.  Skin:  Negative for color change, rash and wound.  Neurological:  Negative for dizziness, weakness and headaches.  Psychiatric/Behavioral:  Negative for behavioral problems. The patient is not nervous/anxious.       Immunization History  Administered Date(s) Administered   Influenza Split 09/13/2007, 09/02/2009, 09/02/2010, 09/03/2011, 08/19/2012, 09/22/2013, 08/29/2016, 08/14/2018   Influenza, High Dose Seasonal PF 08/14/2018, 09/30/2019   Influenza,inj,Quad PF,6+ Mos 11/09/2020   Influenza,inj,quad, With Preservative 08/05/2014, 10/18/2015   Janssen (J&J) SARS-COV-2 Vaccination 04/13/2020   Pfizer Covid-19 Vaccine Bivalent Booster 19yrs & up 01/04/2021   Pneumococcal Conjugate-13 09/08/2008, 01/11/2015   Td 07/04/2007   Pertinent  Health Maintenance Due  Topic Date Due   OPHTHALMOLOGY EXAM  Never done   INFLUENZA VACCINE  07/03/2021   FOOT EXAM  01/25/2022   HEMOGLOBIN A1C  04/11/2022   DEXA SCAN  Completed   Fall Risk 10/21/2021 10/21/2021 10/22/2021 10/22/2021 10/24/2021  Falls in the past year? - - - - -  Was there an injury with Fall? - - - - -  Fall Risk Category Calculator - - - - -  Fall Risk Category - - - - -  Patient Fall Risk Level High fall risk High fall risk High fall risk High fall risk High fall risk  Patient at Risk for Falls Due to - - - - -  Fall risk Follow up - - - - -     Vitals:   12/14/21 1000  BP: 130/74  Pulse: 66  Resp: 19  Temp: (!) 97.3 F (36.3 C)  Weight: 212 lb 6.4 oz (96.3 kg)  Height: 5\' 6"  (1.676 m)   Body mass index is 34.28 kg/m.  Physical Exam Constitutional:      Appearance: She is obese.  HENT:     Head: Normocephalic and atraumatic.     Nose: Nose normal.     Mouth/Throat:     Mouth: Mucous membranes are moist.  Eyes:     Conjunctiva/sclera: Conjunctivae normal.  Cardiovascular:     Rate and Rhythm: Normal rate and regular rhythm.  Pulmonary:      Effort: Pulmonary effort is normal.     Breath sounds: Normal breath sounds.  Abdominal:     General: Bowel sounds are normal.     Palpations: Abdomen is soft.  Musculoskeletal:        General: Normal range of motion.  Cervical back: Normal range of motion.  Skin:    General: Skin is warm and dry.  Neurological:     General: No focal deficit present.     Mental Status: She is alert.     Comments: Alert to self and place, disoriented to time.  Psychiatric:        Mood and Affect: Mood normal.        Behavior: Behavior normal.        Thought Content: Thought content normal.        Judgment: Judgment normal.       Labs reviewed: Recent Labs    10/13/21 0324 10/14/21 0339 10/20/21 0216 10/21/21 0156 10/22/21 0309 10/23/21 0551 10/24/21 1045 10/24/21 1111 10/27/21 0000 11/22/21 0000  NA 133*   < > 131* 127* 128* 132* 136 136 137 135*  K 4.4   < > 4.3 4.8 4.8 4.9 4.7 4.5 4.5 3.9  CL 106   < > 99 98 99 103 107  --  107 99  CO2 20*   < > 25 20* 20* 23 19*  --  24* 25*  GLUCOSE 173*   < > 128* 193* 173* 175* 190*  --   --   --   BUN 23   < > 35* 53* 63* 50* 35*  --  26* 20  CREATININE 1.14*   < > 1.45* 2.02* 2.04* 1.51* 1.16*  --  0.9 1.1  CALCIUM 8.1*   < > 8.3* 8.0* 7.7* 7.9* 8.3*  --  8.7 8.8  MG 1.6*  --  1.8 1.9  --   --   --   --   --   --    < > = values in this interval not displayed.   Recent Labs    10/11/21 1815 10/24/21 1045  AST 14* 20  ALT 8 20  ALKPHOS 81 89  BILITOT 0.4 0.6  PROT 5.8* 4.9*  ALBUMIN 3.4* 2.2*   Recent Labs    10/22/21 0309 10/23/21 0551 10/24/21 1045 10/24/21 1111 10/27/21 0000 11/22/21 0000  WBC 16.7* 11.4* 7.8  --  8.7 10.9  NEUTROABS 13.1* 8.2* 5.4  --   --   --   HGB 10.0* 10.7* 14.0 10.2* 10.9* 13.4  HCT 30.5* 32.3* 43.3 30.0* 34* 41  MCV 95.9 95.3 96.4  --   --   --   PLT 232 263 226  --  276 236   Lab Results  Component Value Date   TSH 3.277 08/11/2018   Lab Results  Component Value Date   HGBA1C 6.5  (H) 10/12/2021   Lab Results  Component Value Date   CHOL 149 08/12/2018   HDL 34 (L) 08/12/2018   LDLCALC 81 08/12/2018   TRIG 171 (H) 08/12/2018   CHOLHDL 4.4 08/12/2018    Significant Diagnostic Results in last 30 days:  XR Ankle Complete Left  Result Date: 12/05/2021 2 view radiographs of the left ankle shows a congruent mortise with bimalleolar ankle fracture without displacement.  XR Ankle Complete Right  Result Date: 12/05/2021 2 view radiographs of the right ankle shows a congruent mortise with bimalleolar fracture with shortening of the fibula.   Assessment/Plan  1. Essential hypertension -   Blood pressure well controlled Continue current medications  2. Coronary artery disease involving coronary bypass graft of native heart without angina pectoris -   Stable, continue NTG PRN and isosorbide mononitrate ER  3. Acquired hypothyroidism Lab Results  Component Value Date  TSH 3.277 08/11/2018   -    Continue levothyroxine  4. Type 2 diabetes mellitus with diabetic neuropathy, with long-term current use of insulin (HCC) Lab Results  Component Value Date   HGBA1C 6.5 (H) 10/12/2021   -  CBGs stable, continue insulin glargine and NovoLog -   Ophthalmology consult  5. Closed fracture of ankle with routine healing, unspecified laterality, subsequent encounter -   Bilateral ankle fracture, WBAT  6. Need for vaccination -    PPSV 23 vaccine 0.5 mL IM x1 and influenza vaccine 0.5 mL IM x1   Family/ staff Communication: Discussed plan of care with resident and charge nurse  Labs/tests ordered: None    Kenard GowerMonina Medina-Vargas, DNP, MSN, FNP-BC [facility] [phone] (Monday-Friday 8:00 a.m. - 5:00 p.m.) [phone] (after hours)

## 2021-12-15 DIAGNOSIS — S82842D Displaced bimalleolar fracture of left lower leg, subsequent encounter for closed fracture with routine healing: Secondary | ICD-10-CM | POA: Diagnosis not present

## 2021-12-15 DIAGNOSIS — M6281 Muscle weakness (generalized): Secondary | ICD-10-CM | POA: Diagnosis not present

## 2021-12-16 DIAGNOSIS — S82842D Displaced bimalleolar fracture of left lower leg, subsequent encounter for closed fracture with routine healing: Secondary | ICD-10-CM | POA: Diagnosis not present

## 2021-12-16 DIAGNOSIS — M6281 Muscle weakness (generalized): Secondary | ICD-10-CM | POA: Diagnosis not present

## 2021-12-18 ENCOUNTER — Ambulatory Visit: Payer: Medicare Other | Admitting: Neurology

## 2021-12-18 DIAGNOSIS — M6281 Muscle weakness (generalized): Secondary | ICD-10-CM | POA: Diagnosis not present

## 2021-12-18 DIAGNOSIS — S82842D Displaced bimalleolar fracture of left lower leg, subsequent encounter for closed fracture with routine healing: Secondary | ICD-10-CM | POA: Diagnosis not present

## 2021-12-19 DIAGNOSIS — S82842D Displaced bimalleolar fracture of left lower leg, subsequent encounter for closed fracture with routine healing: Secondary | ICD-10-CM | POA: Diagnosis not present

## 2021-12-19 DIAGNOSIS — M6281 Muscle weakness (generalized): Secondary | ICD-10-CM | POA: Diagnosis not present

## 2021-12-20 DIAGNOSIS — M6281 Muscle weakness (generalized): Secondary | ICD-10-CM | POA: Diagnosis not present

## 2021-12-20 DIAGNOSIS — S82842D Displaced bimalleolar fracture of left lower leg, subsequent encounter for closed fracture with routine healing: Secondary | ICD-10-CM | POA: Diagnosis not present

## 2021-12-21 DIAGNOSIS — M6281 Muscle weakness (generalized): Secondary | ICD-10-CM | POA: Diagnosis not present

## 2021-12-21 DIAGNOSIS — S82842D Displaced bimalleolar fracture of left lower leg, subsequent encounter for closed fracture with routine healing: Secondary | ICD-10-CM | POA: Diagnosis not present

## 2021-12-22 DIAGNOSIS — S82842D Displaced bimalleolar fracture of left lower leg, subsequent encounter for closed fracture with routine healing: Secondary | ICD-10-CM | POA: Diagnosis not present

## 2021-12-22 DIAGNOSIS — M6281 Muscle weakness (generalized): Secondary | ICD-10-CM | POA: Diagnosis not present

## 2021-12-23 DIAGNOSIS — S82842D Displaced bimalleolar fracture of left lower leg, subsequent encounter for closed fracture with routine healing: Secondary | ICD-10-CM | POA: Diagnosis not present

## 2021-12-23 DIAGNOSIS — M6281 Muscle weakness (generalized): Secondary | ICD-10-CM | POA: Diagnosis not present

## 2021-12-25 ENCOUNTER — Other Ambulatory Visit: Payer: Self-pay | Admitting: Adult Health

## 2021-12-25 DIAGNOSIS — S82899D Other fracture of unspecified lower leg, subsequent encounter for closed fracture with routine healing: Secondary | ICD-10-CM

## 2021-12-25 DIAGNOSIS — M6281 Muscle weakness (generalized): Secondary | ICD-10-CM | POA: Diagnosis not present

## 2021-12-25 DIAGNOSIS — S82842D Displaced bimalleolar fracture of left lower leg, subsequent encounter for closed fracture with routine healing: Secondary | ICD-10-CM | POA: Diagnosis not present

## 2021-12-25 MED ORDER — OXYCODONE HCL 5 MG PO TABS: 5.0000 mg | ORAL_TABLET | Freq: Three times a day (TID) | ORAL | 0 refills | Status: DC | PRN

## 2021-12-26 DIAGNOSIS — S82842D Displaced bimalleolar fracture of left lower leg, subsequent encounter for closed fracture with routine healing: Secondary | ICD-10-CM | POA: Diagnosis not present

## 2021-12-26 DIAGNOSIS — M6281 Muscle weakness (generalized): Secondary | ICD-10-CM | POA: Diagnosis not present

## 2021-12-27 DIAGNOSIS — M6281 Muscle weakness (generalized): Secondary | ICD-10-CM | POA: Diagnosis not present

## 2021-12-27 DIAGNOSIS — S82842D Displaced bimalleolar fracture of left lower leg, subsequent encounter for closed fracture with routine healing: Secondary | ICD-10-CM | POA: Diagnosis not present

## 2021-12-28 DIAGNOSIS — R0989 Other specified symptoms and signs involving the circulatory and respiratory systems: Secondary | ICD-10-CM | POA: Diagnosis not present

## 2021-12-28 DIAGNOSIS — M6281 Muscle weakness (generalized): Secondary | ICD-10-CM | POA: Diagnosis not present

## 2021-12-28 DIAGNOSIS — S82842D Displaced bimalleolar fracture of left lower leg, subsequent encounter for closed fracture with routine healing: Secondary | ICD-10-CM | POA: Diagnosis not present

## 2021-12-29 DIAGNOSIS — S82842D Displaced bimalleolar fracture of left lower leg, subsequent encounter for closed fracture with routine healing: Secondary | ICD-10-CM | POA: Diagnosis not present

## 2021-12-29 DIAGNOSIS — M6281 Muscle weakness (generalized): Secondary | ICD-10-CM | POA: Diagnosis not present

## 2021-12-29 DIAGNOSIS — E119 Type 2 diabetes mellitus without complications: Secondary | ICD-10-CM | POA: Diagnosis not present

## 2021-12-29 LAB — CBC: RBC: 4.35 (ref 3.87–5.11)

## 2021-12-29 LAB — CBC AND DIFFERENTIAL
Hemoglobin: 12.8 (ref 12.0–16.0)
Neutrophils Absolute: 7.3
Platelets: 190 (ref 150–399)
WBC: 10.2

## 2021-12-29 LAB — BASIC METABOLIC PANEL
BUN: 13 (ref 4–21)
CO2: 25 — AB (ref 13–22)
Chloride: 99 (ref 99–108)
Creatinine: 0.6 (ref 0.5–1.1)
Glucose: 196
Potassium: 3.3 — AB (ref 3.4–5.3)
Sodium: 138 (ref 137–147)

## 2021-12-29 LAB — COMPREHENSIVE METABOLIC PANEL: Calcium: 8.7 (ref 8.7–10.7)

## 2021-12-30 DIAGNOSIS — S82842D Displaced bimalleolar fracture of left lower leg, subsequent encounter for closed fracture with routine healing: Secondary | ICD-10-CM | POA: Diagnosis not present

## 2021-12-30 DIAGNOSIS — M6281 Muscle weakness (generalized): Secondary | ICD-10-CM | POA: Diagnosis not present

## 2022-01-01 DIAGNOSIS — M6281 Muscle weakness (generalized): Secondary | ICD-10-CM | POA: Diagnosis not present

## 2022-01-01 DIAGNOSIS — S82842D Displaced bimalleolar fracture of left lower leg, subsequent encounter for closed fracture with routine healing: Secondary | ICD-10-CM | POA: Diagnosis not present

## 2022-01-02 DIAGNOSIS — S82842D Displaced bimalleolar fracture of left lower leg, subsequent encounter for closed fracture with routine healing: Secondary | ICD-10-CM | POA: Diagnosis not present

## 2022-01-02 DIAGNOSIS — M6281 Muscle weakness (generalized): Secondary | ICD-10-CM | POA: Diagnosis not present

## 2022-01-03 DIAGNOSIS — M6281 Muscle weakness (generalized): Secondary | ICD-10-CM | POA: Diagnosis not present

## 2022-01-03 DIAGNOSIS — I739 Peripheral vascular disease, unspecified: Secondary | ICD-10-CM | POA: Diagnosis not present

## 2022-01-03 DIAGNOSIS — G2581 Restless legs syndrome: Secondary | ICD-10-CM | POA: Diagnosis not present

## 2022-01-03 DIAGNOSIS — S82842D Displaced bimalleolar fracture of left lower leg, subsequent encounter for closed fracture with routine healing: Secondary | ICD-10-CM | POA: Diagnosis not present

## 2022-01-03 DIAGNOSIS — Z741 Need for assistance with personal care: Secondary | ICD-10-CM | POA: Diagnosis not present

## 2022-01-04 DIAGNOSIS — G2581 Restless legs syndrome: Secondary | ICD-10-CM | POA: Diagnosis not present

## 2022-01-04 DIAGNOSIS — I739 Peripheral vascular disease, unspecified: Secondary | ICD-10-CM | POA: Diagnosis not present

## 2022-01-04 DIAGNOSIS — S82842D Displaced bimalleolar fracture of left lower leg, subsequent encounter for closed fracture with routine healing: Secondary | ICD-10-CM | POA: Diagnosis not present

## 2022-01-04 DIAGNOSIS — M6281 Muscle weakness (generalized): Secondary | ICD-10-CM | POA: Diagnosis not present

## 2022-01-04 DIAGNOSIS — Z741 Need for assistance with personal care: Secondary | ICD-10-CM | POA: Diagnosis not present

## 2022-01-05 DIAGNOSIS — I739 Peripheral vascular disease, unspecified: Secondary | ICD-10-CM | POA: Diagnosis not present

## 2022-01-05 DIAGNOSIS — Z741 Need for assistance with personal care: Secondary | ICD-10-CM | POA: Diagnosis not present

## 2022-01-05 DIAGNOSIS — S82842D Displaced bimalleolar fracture of left lower leg, subsequent encounter for closed fracture with routine healing: Secondary | ICD-10-CM | POA: Diagnosis not present

## 2022-01-05 DIAGNOSIS — G2581 Restless legs syndrome: Secondary | ICD-10-CM | POA: Diagnosis not present

## 2022-01-05 DIAGNOSIS — M6281 Muscle weakness (generalized): Secondary | ICD-10-CM | POA: Diagnosis not present

## 2022-01-08 ENCOUNTER — Non-Acute Institutional Stay (SKILLED_NURSING_FACILITY): Payer: Medicare Other | Admitting: Adult Health

## 2022-01-08 ENCOUNTER — Encounter: Payer: Self-pay | Admitting: Adult Health

## 2022-01-08 DIAGNOSIS — M6281 Muscle weakness (generalized): Secondary | ICD-10-CM | POA: Diagnosis not present

## 2022-01-08 DIAGNOSIS — I739 Peripheral vascular disease, unspecified: Secondary | ICD-10-CM | POA: Diagnosis not present

## 2022-01-08 DIAGNOSIS — E039 Hypothyroidism, unspecified: Secondary | ICD-10-CM

## 2022-01-08 DIAGNOSIS — R197 Diarrhea, unspecified: Secondary | ICD-10-CM | POA: Diagnosis not present

## 2022-01-08 DIAGNOSIS — S82842D Displaced bimalleolar fracture of left lower leg, subsequent encounter for closed fracture with routine healing: Secondary | ICD-10-CM | POA: Diagnosis not present

## 2022-01-08 DIAGNOSIS — Z741 Need for assistance with personal care: Secondary | ICD-10-CM | POA: Diagnosis not present

## 2022-01-08 DIAGNOSIS — G2581 Restless legs syndrome: Secondary | ICD-10-CM | POA: Diagnosis not present

## 2022-01-08 NOTE — Progress Notes (Signed)
Location:  [facility] Room Number: 319 A Place of Service:  SNF (31) Provider:  Kenard Gower, DNP, FNP-BC  Patient Care Team: [name], MD as PCP - General (Family Medicine) [name], Deloris Ping, MD as PCP - Cardiology (Cardiology) Glendale Chard, DO as Consulting Physician (Neurology)  Extended Emergency Contact Information Primary Emergency Contact: Kai Levins Address: [address]          [place], [place] [address] Darden Amber of [place] Home Phone: [phone] Relation: Spouse Secondary Emergency Contact: DENNIS,SHERRIE Mobile Phone: [phone] Relation: Daughter  Code Status:   Full Code  Goals of care: Advanced Directive information Advanced Directives 11/23/2021  Does Patient Have a Medical Advance Directive? No  Type of Advance Directive -  Would patient like information on creating a medical advance directive? No - Patient declined     Chief Complaint  Patient presents with   Acute Visit    Self medication    HPI:  Pt is a 88 y.o. female seen today for an acute visit due to self medication. Staff reported that resident has been taking imodium that was brought by daughter and did not tell staff. Noted Imodium capsule on side table. Resident stated that she has diarrhea. Staff reported that resident has no episode of diarrhea. She takes Levothyroxine 100 mcg daily for hypothyroidism. Last tsh level 3.277, taken 08/11/18. She denies having palpitations.  Lab Results  Component Value Date   TSH 3.277 08/11/2018     Past Medical History:  Diagnosis Date   Arthritis    Coronary artery disease 1999   CABG   Depression    Diabetes mellitus without complication (HCC)    type II; metformin and Amaryl   Diabetic neuropathy (HCC)    History of anemia as a child    HOH (hard of hearing)    Hypertension    Hypothyroidism    synthroid   PONV (postoperative nausea and vomiting)    Restless leg syndrome    S/P cardiac  cath 08/13/18 stable CAD 08/14/2018   Shortness of breath 09/03/12   ECHO- The LA is Moderately Dilated, mild mitral annular calcification. mild pulmonary hypertension, moderate concentric LV hypertrophy. Atrial septum is aneurysmal. Aortic valve appears to be mildly sclerotic.EF 88%   Urinary incontinence    Weakness of both legs    Past Surgical History:  Procedure Laterality Date   ABDOMINAL HYSTERECTOMY     APPENDECTOMY     BACK SURGERY     CARDIAC CATHERIZATION  08/20/03   Widely patent bypass grafts. Normal left ventricular systolic function.   CARDIAC CATHETERIZATION     CHOLECYSTECTOMY     COLONOSCOPY     CORONARY ARTERY BYPASS GRAFT     EYE SURGERY     bilateral cataracts   LEFT HEART CATH AND CORONARY ANGIOGRAPHY N/A 08/13/2018   Procedure: LEFT HEART CATH AND CORONARY ANGIOGRAPHY;  Surgeon: Kathleene Hazel, MD;  Location: [facility] INVASIVE CV [facility];  Service: Cardiovascular;  Laterality: N/A;   LUMBAR LAMINECTOMY/DECOMPRESSION MICRODISCECTOMY N/A 07/26/2015   Procedure: BILATERAL Lumbar 3-4 SUBTOTAL HEMILAMINECTOMY WITH LATERAL RECESS DECOMPRESSION;  Surgeon: Kerrin Champagne, MD;  Location: [facility] OR;  Service: Orthopedics;  Laterality: N/A;   SHOULDER SURGERY     TUBAL LIGATION      Allergies  Allergen Reactions   Duloxetine Hcl Nausea And Vomiting and Other (See Comments)   Statins Other (See Comments)    Causes myalgias Other reaction(s): Unknown   Amitriptyline Other (See Comments)  Pt. States that it caused it to be suicidal.    Benadryl [Diphenhydramine Hcl (Sleep)] Other (See Comments)    Makes the patient very "hyper"   Ezetimibe     Other reaction(s): Unknown Other reaction(s): Unknown   Gabapentin Other (See Comments)   Metformin Hcl     Other reaction(s): Unknown Other reaction(s): Unknown   Other     Other reaction(s): Unknown   Sitagliptin     Other reaction(s): Unknown Other reaction(s): Unknown   Tylenol [Acetaminophen] Nausea Only   Colesevelam Hcl  Other (See Comments)    Causes dizziness Other reaction(s): Unknown    Outpatient Encounter Medications as of 01/08/2022  Medication Sig   amLODipine (NORVASC) 10 MG tablet Take 10 mg by mouth daily.   apixaban (ELIQUIS) 5 MG TABS tablet Take 1 tablet (5 mg total) by mouth 2 (two) times daily.   Ascorbic Acid (VITAMIN C) 1000 MG tablet Take 1,000 mg by mouth daily.   benazepril (LOTENSIN) 10 MG tablet Take 1 tablet (10 mg total) by mouth daily.   bisacodyl (DULCOLAX) 10 MG suppository Place 10 mg rectally as needed for moderate constipation.   carbidopa-levodopa (SINEMET IR) 25-100 MG tablet Take 1.5 tablets by mouth at bedtime.   cholecalciferol (VITAMIN D3) 25 MCG (1000 UNIT) tablet Take 1,000 Units by mouth daily.   furosemide (LASIX) 40 MG tablet Take 1 tablet (40 mg total) by mouth daily.   guaiFENesin (ROBITUSSIN) 100 MG/5ML liquid Take 10 mLs by mouth 3 (three) times daily.   hydrALAZINE (APRESOLINE) 25 MG tablet TAKE 1 TABLET BY MOUTH THREE TIMES DAILY   insulin aspart (NOVOLOG) 100 UNIT/ML injection Inject 5 Units into the skin. TWICE DAILY FOR CBG >200mg /dL   isosorbide mononitrate (IMDUR) 30 MG 24 hr tablet Take 1 tablet by mouth once daily   LANTUS SOLOSTAR 100 UNIT/ML Solostar Pen Inject 20 Units into the skin daily.   levothyroxine (SYNTHROID, LEVOTHROID) 100 MCG tablet Take 100 mcg by mouth daily before breakfast.   Magnesium Hydroxide (MILK OF MAGNESIA PO) Take 30 mLs by mouth as needed.   methocarbamol (ROBAXIN) 500 MG tablet Take 500 mg by mouth every 8 (eight) hours as needed for muscle spasms. X 3 days and on 10/27/21 change to every 12 hours as needed for spasms x 5 days   metoprolol succinate (TOPROL-XL) 25 MG 24 hr tablet Take 1 tablet by mouth once daily   nitroGLYCERIN (NITROSTAT) 0.4 MG SL tablet DISSOLVE ONE TABLET UNDER THE TONGUE EVERY 5 MINUTES AS NEEDED FOR CHEST PAIN.   NON FORMULARY Diet:Reg CCD HH/thin diet consistency   nystatin (MYCOSTATIN/NYSTOP) powder  APPLY TOPICALLY TO BUTTOCKS AND GROIN FOLLOWED BY A THIN LAYER OF BARRIER CREAM TWICE A DAY   ondansetron (ZOFRAN) 4 MG tablet Take 4 mg by mouth every 6 (six) hours as needed for nausea.   oxyCODONE (ROXICODONE) 5 MG immediate release tablet Take 1 tablet (5 mg total) by mouth every 8 (eight) hours as needed for severe pain.   Oyster Shell Calcium 500 MG TABS TAKE 1 TABLET BY MOUTH ONCE DAILY FOR SUPPLEMENT   polyethylene glycol (MIRALAX / GLYCOLAX) 17 g packet Take 17 g by mouth daily.   senna-docusate (SENOKOT-S) 8.6-50 MG tablet Take 1 tablet by mouth 2 (two) times daily.   Sodium Phosphates (RA SALINE ENEMA RE) Place 1 Dose rectally as needed.   vitamin B-12 (CYANOCOBALAMIN) 1000 MCG tablet Take 1,000 mcg by mouth daily.   [DISCONTINUED] pregabalin (LYRICA) 50 MG capsule Take 1  capsule (50 mg total) by mouth 2 (two) times daily.   No facility-administered encounter medications on file as of 01/08/2022.    Review of Systems  Constitutional:  Negative for appetite change, chills, fatigue and fever.  HENT:  Negative for congestion, hearing loss, rhinorrhea and sore throat.   Eyes: Negative.   Respiratory:  Negative for cough, shortness of breath and wheezing.   Cardiovascular:  Negative for chest pain, palpitations and leg swelling.  Gastrointestinal:  Positive for diarrhea. Negative for abdominal pain, constipation, nausea and vomiting.  Genitourinary:  Negative for dysuria.  Skin:  Negative for color change, rash and wound.  Neurological:  Negative for dizziness, weakness and headaches.  Psychiatric/Behavioral:  Negative for behavioral problems. The patient is not nervous/anxious.       Immunization History  Administered Date(s) Administered   Influenza Split 09/13/2007, 09/02/2009, 09/02/2010, 09/03/2011, 08/19/2012, 09/22/2013, 08/29/2016, 08/14/2018   Influenza, High Dose Seasonal PF 08/14/2018, 09/30/2019   Influenza,inj,Quad PF,6+ Mos 11/09/2020   Influenza,inj,quad, With  Preservative 08/05/2014, 10/18/2015   Janssen (J&J) SARS-COV-2 Vaccination 04/13/2020   Pfizer Covid-19 Vaccine Bivalent Booster 16yrs & up 01/04/2021   Pneumococcal Conjugate-13 09/08/2008, 01/11/2015   Td 07/04/2007   Pertinent  Health Maintenance Due  Topic Date Due   OPHTHALMOLOGY EXAM  Never done   INFLUENZA VACCINE  07/03/2021   FOOT EXAM  01/25/2022   HEMOGLOBIN A1C  04/11/2022   DEXA SCAN  Completed   Fall Risk 10/21/2021 10/21/2021 10/22/2021 10/22/2021 10/24/2021  Falls in the past year? - - - - -  Was there an injury with Fall? - - - - -  Fall Risk Category Calculator - - - - -  Fall Risk Category - - - - -  Patient Fall Risk Level High fall risk High fall risk High fall risk High fall risk High fall risk  Patient at Risk for Falls Due to - - - - -  Fall risk Follow up - - - - -     Vitals:   01/08/22 1535  BP: (!) 121/54  Pulse: 64  Resp: 19  Temp: (!) 97.5 F (36.4 C)  Weight: 218 lb 3.2 oz (99 kg)  Height: 5\' 6"  (1.676 m)   Body mass index is 35.22 kg/m.  Physical Exam Constitutional:      Appearance: Normal appearance.  HENT:     Head: Normocephalic and atraumatic.     Nose: Nose normal.     Mouth/Throat:     Mouth: Mucous membranes are moist.  Eyes:     Conjunctiva/sclera: Conjunctivae normal.  Cardiovascular:     Rate and Rhythm: Normal rate and regular rhythm.  Pulmonary:     Effort: Pulmonary effort is normal.     Breath sounds: Normal breath sounds.  Abdominal:     General: Bowel sounds are normal.     Palpations: Abdomen is soft.  Musculoskeletal:        General: Normal range of motion.     Cervical back: Normal range of motion.  Skin:    General: Skin is warm and dry.  Neurological:     Mental Status: She is alert. Mental status is at baseline.  Psychiatric:        Mood and Affect: Mood normal.        Behavior: Behavior normal.        Thought Content: Thought content normal.       Labs reviewed: Recent Labs     10/13/21 0324 10/14/21 0339 10/20/21 0216  10/21/21 0156 10/22/21 0309 10/23/21 0551 10/24/21 1045 10/24/21 1111 10/27/21 0000 11/22/21 0000  NA 133*   < > 131* 127* 128* 132* 136 136 137 135*  K 4.4   < > 4.3 4.8 4.8 4.9 4.7 4.5 4.5 3.9  CL 106   < > 99 98 99 103 107  --  107 99  CO2 20*   < > 25 20* 20* 23 19*  --  24* 25*  GLUCOSE 173*   < > 128* 193* 173* 175* 190*  --   --   --   BUN 23   < > 35* 53* 63* 50* 35*  --  26* 20  CREATININE 1.14*   < > 1.45* 2.02* 2.04* 1.51* 1.16*  --  0.9 1.1  CALCIUM 8.1*   < > 8.3* 8.0* 7.7* 7.9* 8.3*  --  8.7 8.8  MG 1.6*  --  1.8 1.9  --   --   --   --   --   --    < > = values in this interval not displayed.   Recent Labs    10/11/21 1815 10/24/21 1045  AST 14* 20  ALT 8 20  ALKPHOS 81 89  BILITOT 0.4 0.6  PROT 5.8* 4.9*  ALBUMIN 3.4* 2.2*   Recent Labs    10/22/21 0309 10/23/21 0551 10/24/21 1045 10/24/21 1111 10/27/21 0000 11/22/21 0000  WBC 16.7* 11.4* 7.8  --  8.7 10.9  NEUTROABS 13.1* 8.2* 5.4  --   --   --   HGB 10.0* 10.7* 14.0 10.2* 10.9* 13.4  HCT 30.5* 32.3* 43.3 30.0* 34* 41  MCV 95.9 95.3 96.4  --   --   --   PLT 232 263 226  --  276 236   Lab Results  Component Value Date   TSH 3.277 08/11/2018   Lab Results  Component Value Date   HGBA1C 6.5 (H) 10/12/2021   Lab Results  Component Value Date   CHOL 149 08/12/2018   HDL 34 (L) 08/12/2018   LDLCALC 81 08/12/2018   TRIG 171 (H) 08/12/2018   CHOLHDL 4.4 08/12/2018    Significant Diagnostic Results in last 30 days:  No results found.  Assessment/Plan  1. Diarrhea, unspecified type -  staff to monitor diarrhe -  will get stool culture for c-difficile, CBC and CMP  2. Acquired hypothyroidism -  continue Levothyroxine -  check for tsh    Family/ staff Communication: Discussed plan of care with resident and charge nurse  Labs/tests ordered:  tsh, stool culture for c-difficile, CBC and CMP    Kenard GowerMonina Medina-Vargas, DNP, MSN,  FNP-BC [facility] [phone] (Monday-Friday 8:00 a.m. - 5:00 p.m.) [phone] (after hours)

## 2022-01-09 DIAGNOSIS — R197 Diarrhea, unspecified: Secondary | ICD-10-CM | POA: Diagnosis not present

## 2022-01-09 DIAGNOSIS — I5032 Chronic diastolic (congestive) heart failure: Secondary | ICD-10-CM | POA: Diagnosis not present

## 2022-01-09 LAB — CBC AND DIFFERENTIAL
HCT: 41 (ref 36–46)
Hemoglobin: 13.4 (ref 12.0–16.0)
Neutrophils Absolute: 5.9
Platelets: 163 (ref 150–399)
WBC: 8.2

## 2022-01-09 LAB — HEPATIC FUNCTION PANEL
AST: 7 — AB (ref 13–35)
Bilirubin, Total: 1

## 2022-01-09 LAB — BASIC METABOLIC PANEL
BUN: 17 (ref 4–21)
CO2: 29 — AB (ref 13–22)
Chloride: 97 — AB (ref 99–108)
Creatinine: 0.8 (ref 0.5–1.1)
Glucose: 271
Potassium: 3.2 — AB (ref 3.4–5.3)
Sodium: 137 (ref 137–147)

## 2022-01-09 LAB — COMPREHENSIVE METABOLIC PANEL
Albumin: 3.4 — AB (ref 3.5–5.0)
Calcium: 8.8 (ref 8.7–10.7)
Globulin: 1.6

## 2022-01-09 LAB — CBC: RBC: 4.49 (ref 3.87–5.11)

## 2022-01-10 DIAGNOSIS — M6281 Muscle weakness (generalized): Secondary | ICD-10-CM | POA: Diagnosis not present

## 2022-01-10 DIAGNOSIS — I739 Peripheral vascular disease, unspecified: Secondary | ICD-10-CM | POA: Diagnosis not present

## 2022-01-10 DIAGNOSIS — G2581 Restless legs syndrome: Secondary | ICD-10-CM | POA: Diagnosis not present

## 2022-01-10 DIAGNOSIS — Z741 Need for assistance with personal care: Secondary | ICD-10-CM | POA: Diagnosis not present

## 2022-01-10 DIAGNOSIS — S82842D Displaced bimalleolar fracture of left lower leg, subsequent encounter for closed fracture with routine healing: Secondary | ICD-10-CM | POA: Diagnosis not present

## 2022-01-11 ENCOUNTER — Other Ambulatory Visit: Payer: Self-pay | Admitting: Adult Health

## 2022-01-11 DIAGNOSIS — S82842D Displaced bimalleolar fracture of left lower leg, subsequent encounter for closed fracture with routine healing: Secondary | ICD-10-CM | POA: Diagnosis not present

## 2022-01-11 DIAGNOSIS — I739 Peripheral vascular disease, unspecified: Secondary | ICD-10-CM | POA: Diagnosis not present

## 2022-01-11 DIAGNOSIS — G2581 Restless legs syndrome: Secondary | ICD-10-CM | POA: Diagnosis not present

## 2022-01-11 DIAGNOSIS — Z741 Need for assistance with personal care: Secondary | ICD-10-CM | POA: Diagnosis not present

## 2022-01-11 DIAGNOSIS — M6281 Muscle weakness (generalized): Secondary | ICD-10-CM | POA: Diagnosis not present

## 2022-01-11 MED ORDER — PREGABALIN 50 MG PO CAPS: 50.0000 mg | ORAL_CAPSULE | Freq: Two times a day (BID) | ORAL | 0 refills | Status: DC

## 2022-01-12 DIAGNOSIS — S82842D Displaced bimalleolar fracture of left lower leg, subsequent encounter for closed fracture with routine healing: Secondary | ICD-10-CM | POA: Diagnosis not present

## 2022-01-12 DIAGNOSIS — M6281 Muscle weakness (generalized): Secondary | ICD-10-CM | POA: Diagnosis not present

## 2022-01-12 DIAGNOSIS — I739 Peripheral vascular disease, unspecified: Secondary | ICD-10-CM | POA: Diagnosis not present

## 2022-01-12 DIAGNOSIS — G2581 Restless legs syndrome: Secondary | ICD-10-CM | POA: Diagnosis not present

## 2022-01-12 DIAGNOSIS — Z741 Need for assistance with personal care: Secondary | ICD-10-CM | POA: Diagnosis not present

## 2022-01-13 DIAGNOSIS — I739 Peripheral vascular disease, unspecified: Secondary | ICD-10-CM | POA: Diagnosis not present

## 2022-01-13 DIAGNOSIS — G2581 Restless legs syndrome: Secondary | ICD-10-CM | POA: Diagnosis not present

## 2022-01-13 DIAGNOSIS — Z741 Need for assistance with personal care: Secondary | ICD-10-CM | POA: Diagnosis not present

## 2022-01-13 DIAGNOSIS — M6281 Muscle weakness (generalized): Secondary | ICD-10-CM | POA: Diagnosis not present

## 2022-01-13 DIAGNOSIS — S82842D Displaced bimalleolar fracture of left lower leg, subsequent encounter for closed fracture with routine healing: Secondary | ICD-10-CM | POA: Diagnosis not present

## 2022-01-15 DIAGNOSIS — M6281 Muscle weakness (generalized): Secondary | ICD-10-CM | POA: Diagnosis not present

## 2022-01-15 DIAGNOSIS — I739 Peripheral vascular disease, unspecified: Secondary | ICD-10-CM | POA: Diagnosis not present

## 2022-01-15 DIAGNOSIS — Z741 Need for assistance with personal care: Secondary | ICD-10-CM | POA: Diagnosis not present

## 2022-01-15 DIAGNOSIS — G2581 Restless legs syndrome: Secondary | ICD-10-CM | POA: Diagnosis not present

## 2022-01-15 DIAGNOSIS — S82842D Displaced bimalleolar fracture of left lower leg, subsequent encounter for closed fracture with routine healing: Secondary | ICD-10-CM | POA: Diagnosis not present

## 2022-01-16 DIAGNOSIS — Z741 Need for assistance with personal care: Secondary | ICD-10-CM | POA: Diagnosis not present

## 2022-01-16 DIAGNOSIS — M6281 Muscle weakness (generalized): Secondary | ICD-10-CM | POA: Diagnosis not present

## 2022-01-16 DIAGNOSIS — S82842D Displaced bimalleolar fracture of left lower leg, subsequent encounter for closed fracture with routine healing: Secondary | ICD-10-CM | POA: Diagnosis not present

## 2022-01-16 DIAGNOSIS — I739 Peripheral vascular disease, unspecified: Secondary | ICD-10-CM | POA: Diagnosis not present

## 2022-01-16 DIAGNOSIS — G2581 Restless legs syndrome: Secondary | ICD-10-CM | POA: Diagnosis not present

## 2022-01-17 ENCOUNTER — Non-Acute Institutional Stay (SKILLED_NURSING_FACILITY): Payer: Medicare Other | Admitting: Adult Health

## 2022-01-17 ENCOUNTER — Encounter: Payer: Self-pay | Admitting: Adult Health

## 2022-01-17 DIAGNOSIS — I480 Paroxysmal atrial fibrillation: Secondary | ICD-10-CM | POA: Diagnosis not present

## 2022-01-17 DIAGNOSIS — I739 Peripheral vascular disease, unspecified: Secondary | ICD-10-CM | POA: Diagnosis not present

## 2022-01-17 DIAGNOSIS — S82842D Displaced bimalleolar fracture of left lower leg, subsequent encounter for closed fracture with routine healing: Secondary | ICD-10-CM | POA: Diagnosis not present

## 2022-01-17 DIAGNOSIS — R197 Diarrhea, unspecified: Secondary | ICD-10-CM

## 2022-01-17 DIAGNOSIS — M6281 Muscle weakness (generalized): Secondary | ICD-10-CM | POA: Diagnosis not present

## 2022-01-17 DIAGNOSIS — G2581 Restless legs syndrome: Secondary | ICD-10-CM | POA: Diagnosis not present

## 2022-01-17 DIAGNOSIS — R600 Localized edema: Secondary | ICD-10-CM

## 2022-01-17 DIAGNOSIS — Z741 Need for assistance with personal care: Secondary | ICD-10-CM | POA: Diagnosis not present

## 2022-01-17 NOTE — Progress Notes (Signed)
Location:  [name] Room Number: [id number] Place of Service:  SNF (31) Provider:  Durenda Age, DNP, FNP-BC  Patient Care Team: [name], MD as PCP - General (Family Medicine) [name], [name], MD as PCP - Cardiology (Cardiology) [name], DO as Consulting Physician (Neurology)  Extended Emergency Contact Information Primary Emergency Contact: [name] Address: [address]          [place], [place] [address] [name] of [place] Phone: [phone] Relation: Spouse Secondary Emergency Contact: [place] Mobile Phone: [phone] Relation: Daughter  Code Status:  DNR  Goals of care: Advanced Directive information Advanced Directives 11/23/2021  Does Patient Have a Medical Advance Directive? No  Type of Advance Directive -  Would patient like information on creating a medical advance directive? No - Patient declined     Chief Complaint  Patient presents with   Acute Visit    Swelling right foot    HPI:  Pt is a 88 y.o. [place] seen today for an acute visit. She is a long-term care resident of [facility]. Noted to have right foot 2+edema. She denies pain on right foot. No erythema noted. She takes furosemide 40 mg 1 tab daily for CHF.  She was hospitalized 10/11/21 to 10/23/21 S/P fall sustaining bilateral ankle fracture.  Orthopedic was consulted and recommended nonoperative management.  She was placed on cam boot and was eventually discontinued on 11/30/21.   Resident complained of having diarrhea. Noted resident to have formed soft stool. She currently takes Senexon-S PRN and Miralax PRN for constipation. She takes Eliquis 5 mg 1 tab twice a day for paroxysmal atrial fibrillation.   Past Medical History:  Diagnosis Date   Arthritis    Coronary artery disease 1999   CABG   Depression    Diabetes mellitus without complication ([place])    type II; metformin and Amaryl   Diabetic  neuropathy ([place])    History of anemia as a child    HOH (hard of hearing)    Hypertension    Hypothyroidism    synthroid   PONV (postoperative nausea and vomiting)    Restless leg syndrome    S/P cardiac cath 08/13/18 stable CAD 08/14/2018   Shortness of breath 09/03/12   ECHO- The LA is Moderately Dilated, mild mitral annular calcification. mild pulmonary hypertension, moderate concentric LV hypertrophy. Atrial septum is aneurysmal. Aortic valve appears to be mildly sclerotic.EF 88%   Urinary incontinence    Weakness of both legs    Past Surgical History:  Procedure Laterality Date   ABDOMINAL HYSTERECTOMY     APPENDECTOMY     BACK SURGERY     CARDIAC CATHERIZATION  08/20/03   Widely patent bypass grafts. Normal left ventricular systolic function.   CARDIAC CATHETERIZATION     CHOLECYSTECTOMY     COLONOSCOPY     CORONARY ARTERY BYPASS GRAFT     EYE SURGERY     bilateral cataracts   LEFT HEART CATH AND CORONARY ANGIOGRAPHY N/A 08/13/2018   Procedure: LEFT HEART CATH AND CORONARY ANGIOGRAPHY;  Surgeon: Burnell Blanks, MD;  Location: [facility] CV LAB;  Service: Cardiovascular;  Laterality: N/A;   LUMBAR LAMINECTOMY/DECOMPRESSION MICRODISCECTOMY N/A 07/26/2015   Procedure: BILATERAL Lumbar 3-4 SUBTOTAL HEMILAMINECTOMY WITH LATERAL RECESS DECOMPRESSION;  Surgeon: Jessy Oto, MD;  Location: [facility];  Service: Orthopedics;  Laterality: N/A;   SHOULDER SURGERY     TUBAL LIGATION      Allergies  Allergen  Reactions   Duloxetine Hcl Nausea And Vomiting and Other (See Comments)   Statins Other (See Comments)    Causes myalgias Other reaction(s): Unknown   Amitriptyline Other (See Comments)    Pt. States that it caused it to be suicidal.    Benadryl [Diphenhydramine Hcl (Sleep)] Other (See Comments)    Makes the patient very "hyper"   Ezetimibe     Other reaction(s): Unknown Other reaction(s): Unknown   Gabapentin Other (See Comments)   Metformin Hcl     Other  reaction(s): Unknown Other reaction(s): Unknown   Other     Other reaction(s): Unknown   Sitagliptin     Other reaction(s): Unknown Other reaction(s): Unknown   Tylenol [Acetaminophen] Nausea Only   Colesevelam Hcl Other (See Comments)    Causes dizziness Other reaction(s): Unknown    Outpatient Encounter Medications as of 01/17/2022  Medication Sig   albuterol (PROVENTIL) (2.5 MG/3ML) 0.083% nebulizer solution Take by nebulization.   amLODipine (NORVASC) 10 MG tablet Take 10 mg by mouth daily.   apixaban (ELIQUIS) 5 MG TABS tablet Take 1 tablet (5 mg total) by mouth 2 (two) times daily.   Ascorbic Acid (VITAMIN C) 1000 MG tablet Take 1,000 mg by mouth daily.   benazepril (LOTENSIN) 10 MG tablet Take 1 tablet (10 mg total) by mouth daily.   bisacodyl (DULCOLAX) 10 MG suppository Place 10 mg rectally as needed for moderate constipation.   carbidopa-levodopa (SINEMET IR) 25-100 MG tablet Take 1.5 tablets by mouth at bedtime.   cholecalciferol (VITAMIN D3) 25 MCG (1000 UNIT) tablet Take 1,000 Units by mouth daily.   furosemide (LASIX) 40 MG tablet Take 1 tablet (40 mg total) by mouth daily.   hydrALAZINE (APRESOLINE) 25 MG tablet TAKE 1 TABLET BY MOUTH THREE TIMES DAILY   insulin aspart (NOVOLOG) 100 UNIT/ML injection Inject 5 Units into the skin. TWICE DAILY FOR CBG >200mg /dL   isosorbide mononitrate (IMDUR) 30 MG 24 hr tablet Take 1 tablet by mouth once daily   LANTUS SOLOSTAR 100 UNIT/ML Solostar Pen Inject 20 Units into the skin daily.   levothyroxine (SYNTHROID, LEVOTHROID) 100 MCG tablet Take 100 mcg by mouth daily before breakfast.   Magnesium Hydroxide (MILK OF MAGNESIA PO) Take 30 mLs by mouth as needed.   meclizine (ANTIVERT) 25 MG tablet Take 25 mg by mouth daily.   metoprolol succinate (TOPROL-XL) 25 MG 24 hr tablet Take 1 tablet by mouth once daily   nitroGLYCERIN (NITROSTAT) 0.4 MG SL tablet DISSOLVE ONE TABLET UNDER THE TONGUE EVERY 5 MINUTES AS NEEDED FOR CHEST PAIN.    NON FORMULARY Diet:Reg CCD HH/thin diet consistency   nystatin (MYCOSTATIN/NYSTOP) powder APPLY TOPICALLY TO BUTTOCKS AND GROIN FOLLOWED BY A THIN LAYER OF BARRIER CREAM TWICE A DAY   ondansetron (ZOFRAN) 4 MG tablet Take 4 mg by mouth every 6 (six) hours as needed for nausea.   oxyCODONE (ROXICODONE) 5 MG immediate release tablet Take 1 tablet (5 mg total) by mouth every 8 (eight) hours as needed for severe pain.   Oyster Shell Calcium 500 MG TABS TAKE 1 TABLET BY MOUTH ONCE DAILY FOR SUPPLEMENT   polyethylene glycol (MIRALAX / GLYCOLAX) 17 g packet Take 17 g by mouth daily.   pregabalin (LYRICA) 50 MG capsule Take 1 capsule (50 mg total) by mouth 2 (two) times daily.   senna-docusate (SENOKOT-S) 8.6-50 MG tablet Take 1 tablet by mouth 2 (two) times daily.   Sodium Phosphates (RA SALINE ENEMA RE) Place 1 Dose rectally as needed.  vitamin B-12 (CYANOCOBALAMIN) 1000 MCG tablet Take 1,000 mcg by mouth daily.   [DISCONTINUED] guaiFENesin (ROBITUSSIN) 100 MG/5ML liquid Take 10 mLs by mouth 3 (three) times daily.   [DISCONTINUED] methocarbamol (ROBAXIN) 500 MG tablet Take 500 mg by mouth every 8 (eight) hours as needed for muscle spasms. X 3 days and on 10/27/21 change to every 12 hours as needed for spasms x 5 days   No facility-administered encounter medications on file as of 01/17/2022.    Review of Systems  Constitutional:  Negative for appetite change, chills, fatigue and fever.  HENT:  Negative for congestion, hearing loss, rhinorrhea and sore throat.   Eyes: Negative.   Respiratory:  Negative for cough, shortness of breath and wheezing.   Cardiovascular:  Positive for leg swelling. Negative for chest pain and palpitations.  Gastrointestinal:  Positive for diarrhea. Negative for abdominal pain, constipation, nausea and vomiting.  Genitourinary:  Negative for dysuria.  Musculoskeletal:  Negative for arthralgias, back pain and myalgias.  Skin:  Negative for color change, rash and wound.   Neurological:  Negative for dizziness, weakness and headaches.  Psychiatric/Behavioral:  Negative for behavioral problems. The patient is not nervous/anxious.       Immunization History  Administered Date(s) Administered   Influenza Split 09/13/2007, 09/02/2009, 09/02/2010, 09/03/2011, 08/19/2012, 09/22/2013, 08/29/2016, 08/14/2018   Influenza, High Dose Seasonal PF 08/14/2018, 09/30/2019   Influenza,inj,Quad PF,6+ Mos 11/09/2020   Influenza,inj,quad, With Preservative 08/05/2014, 10/18/2015   Janssen (J&J) SARS-COV-2 Vaccination 04/13/2020   Pfizer Covid-19 Vaccine Bivalent Booster 65yrs & up 01/04/2021   Pneumococcal Conjugate-13 09/08/2008, 01/11/2015   Td 07/04/2007   Pertinent  Health Maintenance Due  Topic Date Due   OPHTHALMOLOGY EXAM  Never done   INFLUENZA VACCINE  07/03/2021   FOOT EXAM  01/25/2022   HEMOGLOBIN A1C  04/11/2022   DEXA SCAN  Completed   Fall Risk 10/21/2021 10/21/2021 10/22/2021 10/22/2021 10/24/2021  Falls in the past year? - - - - -  Was there an injury with Fall? - - - - -  Fall Risk Category Calculator - - - - -  Fall Risk Category - - - - -  Patient Fall Risk Level High fall risk High fall risk High fall risk High fall risk High fall risk  Patient at Risk for Falls Due to - - - - -  Fall risk Follow up - - - - -     Vitals:   01/17/22 1418  BP: (!) 144/58  Pulse: 65  Resp: 18  Temp: (!) 97.2 F (36.2 C)  Weight: 221 lb 6.4 oz (100.4 kg)  Height: 5\' 6"  (1.676 m)   Body mass index is 35.73 kg/m.  Physical Exam Constitutional:      General: She is not in acute distress.    Appearance: She is obese.  HENT:     Head: Normocephalic and atraumatic.     Nose: Nose normal.     Mouth/Throat:     Mouth: Mucous membranes are moist.  Eyes:     Conjunctiva/sclera: Conjunctivae normal.  Cardiovascular:     Rate and Rhythm: Normal rate and regular rhythm.  Pulmonary:     Effort: Pulmonary effort is normal.     Breath sounds: Normal breath  sounds.  Abdominal:     General: Bowel sounds are normal.     Palpations: Abdomen is soft.  Musculoskeletal:        General: Swelling present. No tenderness.     Cervical back: Normal range of motion.  Right lower leg: Edema present.     Comments: Right foot 2+edema  Skin:    General: Skin is warm and dry.  Neurological:     Mental Status: She is alert and oriented to person, place, and time. Mental status is at baseline.  Psychiatric:        Mood and Affect: Mood normal.        Behavior: Behavior normal.        Thought Content: Thought content normal.        Judgment: Judgment normal.       Labs reviewed: Recent Labs    10/13/21 0324 10/14/21 0339 10/20/21 0216 10/21/21 0156 10/22/21 0309 10/23/21 0551 10/24/21 1045 10/24/21 1111 10/27/21 0000 11/22/21 0000  NA 133*   < > 131* 127* 128* 132* 136 136 137 135*  K 4.4   < > 4.3 4.8 4.8 4.9 4.7 4.5 4.5 3.9  CL 106   < > 99 98 99 103 107  --  107 99  CO2 20*   < > 25 20* 20* 23 19*  --  24* 25*  GLUCOSE 173*   < > 128* 193* 173* 175* 190*  --   --   --   BUN 23   < > 35* 53* 63* 50* 35*  --  26* 20  CREATININE 1.14*   < > 1.45* 2.02* 2.04* 1.51* 1.16*  --  0.9 1.1  CALCIUM 8.1*   < > 8.3* 8.0* 7.7* 7.9* 8.3*  --  8.7 8.8  MG 1.6*  --  1.8 1.9  --   --   --   --   --   --    < > = values in this interval not displayed.   Recent Labs    10/11/21 1815 10/24/21 1045  AST 14* 20  ALT 8 20  ALKPHOS 81 89  BILITOT 0.4 0.6  PROT 5.8* 4.9*  ALBUMIN 3.4* 2.2*   Recent Labs    10/22/21 0309 10/23/21 0551 10/24/21 1045 10/24/21 1111 10/27/21 0000 11/22/21 0000  WBC 16.7* 11.4* 7.8  --  8.7 10.9  NEUTROABS 13.1* 8.2* 5.4  --   --   --   HGB 10.0* 10.7* 14.0 10.2* 10.9* 13.4  HCT 30.5* 32.3* 43.3 30.0* 34* 41  MCV 95.9 95.3 96.4  --   --   --   PLT 232 263 226  --  276 236   Lab Results  Component Value Date   TSH 3.277 08/11/2018   Lab Results  Component Value Date   HGBA1C 6.5 (H) 10/12/2021   Lab  Results  Component Value Date   CHOL 149 08/12/2018   HDL 34 (L) 08/12/2018   LDLCALC 81 08/12/2018   TRIG 171 (H) 08/12/2018   CHOLHDL 4.4 08/12/2018    Significant Diagnostic Results in last 30 days:  No results found.  Assessment/Plan  1. Edema of right foot -Apply TED hose stockings, knee-high, on in a.m. and off at at bedtime  2. Paroxysmal atrial fibrillation (HCC) -   Rate controlled, continue Eliquis for anticoagulation and metoprolol succinate ER for rate control  3. Diarrhea, unspecified type -   Discontinue senexon-S PRN and MiraLAX PRN     Family/ staff Communication: Discussed plan of care with resident and charge nurse.  Labs/tests ordered: None    Durenda Age, DNP, MSN, FNP-BC [facility] [phone] (Monday-Friday 8:00 a.m. - 5:00 p.m.) [phone] (after hours)

## 2022-01-18 DIAGNOSIS — S82841D Displaced bimalleolar fracture of right lower leg, subsequent encounter for closed fracture with routine healing: Secondary | ICD-10-CM | POA: Diagnosis not present

## 2022-01-18 DIAGNOSIS — M6281 Muscle weakness (generalized): Secondary | ICD-10-CM | POA: Diagnosis not present

## 2022-01-18 DIAGNOSIS — I739 Peripheral vascular disease, unspecified: Secondary | ICD-10-CM | POA: Diagnosis not present

## 2022-01-18 DIAGNOSIS — S82842D Displaced bimalleolar fracture of left lower leg, subsequent encounter for closed fracture with routine healing: Secondary | ICD-10-CM | POA: Diagnosis not present

## 2022-01-18 DIAGNOSIS — G2581 Restless legs syndrome: Secondary | ICD-10-CM | POA: Diagnosis not present

## 2022-01-18 DIAGNOSIS — Z741 Need for assistance with personal care: Secondary | ICD-10-CM | POA: Diagnosis not present

## 2022-01-18 LAB — BASIC METABOLIC PANEL
BUN: 15 (ref 4–21)
CO2: 30 — AB (ref 13–22)
Chloride: 100 (ref 99–108)
Creatinine: 0.8 (ref 0.5–1.1)
Glucose: 233
Potassium: 3.4 (ref 3.4–5.3)
Sodium: 138 (ref 137–147)

## 2022-01-18 LAB — COMPREHENSIVE METABOLIC PANEL: Calcium: 8.7 (ref 8.7–10.7)

## 2022-01-19 DIAGNOSIS — E1140 Type 2 diabetes mellitus with diabetic neuropathy, unspecified: Secondary | ICD-10-CM | POA: Diagnosis not present

## 2022-01-19 LAB — HEMOGLOBIN A1C: Hemoglobin A1C: 8.5

## 2022-01-19 LAB — TSH: TSH: 5.98 — AB (ref 0.41–5.90)

## 2022-01-20 DIAGNOSIS — G2581 Restless legs syndrome: Secondary | ICD-10-CM | POA: Diagnosis not present

## 2022-01-20 DIAGNOSIS — S82842D Displaced bimalleolar fracture of left lower leg, subsequent encounter for closed fracture with routine healing: Secondary | ICD-10-CM | POA: Diagnosis not present

## 2022-01-20 DIAGNOSIS — M6281 Muscle weakness (generalized): Secondary | ICD-10-CM | POA: Diagnosis not present

## 2022-01-20 DIAGNOSIS — I739 Peripheral vascular disease, unspecified: Secondary | ICD-10-CM | POA: Diagnosis not present

## 2022-01-20 DIAGNOSIS — Z741 Need for assistance with personal care: Secondary | ICD-10-CM | POA: Diagnosis not present

## 2022-01-22 ENCOUNTER — Non-Acute Institutional Stay (SKILLED_NURSING_FACILITY): Payer: Medicare Other | Admitting: Adult Health

## 2022-01-22 ENCOUNTER — Encounter: Payer: Self-pay | Admitting: Adult Health

## 2022-01-22 DIAGNOSIS — D72829 Elevated white blood cell count, unspecified: Secondary | ICD-10-CM | POA: Diagnosis not present

## 2022-01-22 DIAGNOSIS — J811 Chronic pulmonary edema: Secondary | ICD-10-CM | POA: Diagnosis not present

## 2022-01-22 DIAGNOSIS — E1140 Type 2 diabetes mellitus with diabetic neuropathy, unspecified: Secondary | ICD-10-CM | POA: Diagnosis not present

## 2022-01-22 DIAGNOSIS — N179 Acute kidney failure, unspecified: Secondary | ICD-10-CM | POA: Diagnosis not present

## 2022-01-22 DIAGNOSIS — I517 Cardiomegaly: Secondary | ICD-10-CM | POA: Diagnosis not present

## 2022-01-22 DIAGNOSIS — R059 Cough, unspecified: Secondary | ICD-10-CM | POA: Diagnosis not present

## 2022-01-22 DIAGNOSIS — Z794 Long term (current) use of insulin: Secondary | ICD-10-CM | POA: Diagnosis not present

## 2022-01-22 DIAGNOSIS — R1112 Projectile vomiting: Secondary | ICD-10-CM | POA: Diagnosis not present

## 2022-01-22 LAB — BASIC METABOLIC PANEL
BUN: 14 (ref 4–21)
CO2: 28 — AB (ref 13–22)
Chloride: 98 — AB (ref 99–108)
Creatinine: 0.8 (ref 0.5–1.1)
Glucose: 296
Potassium: 3.8 mEq/L (ref 3.5–5.1)
Sodium: 139 (ref 137–147)

## 2022-01-22 LAB — CBC: RBC: 4.5 (ref 3.87–5.11)

## 2022-01-22 LAB — CBC AND DIFFERENTIAL
HCT: 41 (ref 36–46)
Hemoglobin: 12.7 (ref 12.0–16.0)
Platelets: 187 10*3/uL (ref 150–400)
WBC: 15.1

## 2022-01-22 LAB — COMPREHENSIVE METABOLIC PANEL: Calcium: 8.7 (ref 8.7–10.7)

## 2022-01-22 NOTE — Progress Notes (Signed)
Location:  [facility] Room Number: 319-A Place of Service:  SNF (31) Provider:  Kenard Gower, DNP, FNP-BC  Patient Care Team: [name], MD as PCP - General (Family Medicine) [name], Deloris Ping, MD as PCP - Cardiology (Cardiology) Glendale Chard, DO as Consulting Physician (Neurology)  Extended Emergency Contact Information Primary Emergency Contact: Kai Levins Address: [address]          [place], [place] [address] [place] of [place] Home Phone: [phone] Relation: Spouse Secondary Emergency Contact: DENNIS,SHERRIE Mobile Phone: [phone] Relation: Daughter  Code Status:  Full Code  Goals of care: Advanced Directive information Advanced Directives 01/22/2022  Does Patient Have a Medical Advance Directive? No  Type of Advance Directive -  Would patient like information on creating a medical advance directive? No - Patient declined     Chief Complaint  Patient presents with   Acute Visit    HPI:  Pt is a 88 y.o. female seen today for an acute visit regarding nausea and vomiting. She was noted to have clear sputums in a cup on her bedside table. She complains of nausea. She has has productive cough. No reported fever nor chills. Labs showed wbc 15.1, hgb 12.7, negative COVID-19 test. CBGs ranging from  124 to 325. She takes insulin glargine 20 units subcutaneously daily and NovoLog 5 units subcutaneously twice a day for CBG >200 for diabetes mellitus.                       Past Medical History:  Diagnosis Date   Arthritis    Coronary artery disease 1999   CABG   Depression    Diabetes mellitus without complication (HCC)    type II; metformin and Amaryl   Diabetic neuropathy (HCC)    History of anemia as a child    HOH (hard of hearing)    Hypertension    Hypothyroidism    synthroid   PONV (postoperative nausea and vomiting)    Restless leg syndrome    S/P cardiac cath 08/13/18 stable CAD 08/14/2018   Shortness  of breath 09/03/12   ECHO- The LA is Moderately Dilated, mild mitral annular calcification. mild pulmonary hypertension, moderate concentric LV hypertrophy. Atrial septum is aneurysmal. Aortic valve appears to be mildly sclerotic.EF 88%   Urinary incontinence    Weakness of both legs    Past Surgical History:  Procedure Laterality Date   ABDOMINAL HYSTERECTOMY     APPENDECTOMY     BACK SURGERY     CARDIAC CATHERIZATION  08/20/03   Widely patent bypass grafts. Normal left ventricular systolic function.   CARDIAC CATHETERIZATION     CHOLECYSTECTOMY     COLONOSCOPY     CORONARY ARTERY BYPASS GRAFT     EYE SURGERY     bilateral cataracts   LEFT HEART CATH AND CORONARY ANGIOGRAPHY N/A 08/13/2018   Procedure: LEFT HEART CATH AND CORONARY ANGIOGRAPHY;  Surgeon: Kathleene Hazel, MD;  Location: [facility] INVASIVE CV LAB;  Service: Cardiovascular;  Laterality: N/A;   LUMBAR LAMINECTOMY/DECOMPRESSION MICRODISCECTOMY N/A 07/26/2015   Procedure: BILATERAL Lumbar 3-4 SUBTOTAL HEMILAMINECTOMY WITH LATERAL RECESS DECOMPRESSION;  Surgeon: Kerrin Champagne, MD;  Location: [facility] OR;  Service: Orthopedics;  Laterality: N/A;   SHOULDER SURGERY     TUBAL LIGATION      Allergies  Allergen Reactions   Duloxetine Hcl Nausea And Vomiting and Other (See Comments)   Statins Other (See Comments)    Causes myalgias Other  reaction(s): Unknown   Amitriptyline Other (See Comments)    Pt. States that it caused it to be suicidal.    Benadryl [Diphenhydramine Hcl (Sleep)] Other (See Comments)    Makes the patient very "hyper"   Ezetimibe     Other reaction(s): Unknown Other reaction(s): Unknown   Gabapentin Other (See Comments)   Metformin Hcl     Other reaction(s): Unknown Other reaction(s): Unknown   Other     Other reaction(s): Unknown   Sitagliptin     Other reaction(s): Unknown Other reaction(s): Unknown   Tylenol [Acetaminophen] Nausea Only   Colesevelam Hcl Other (See Comments)    Causes  dizziness Other reaction(s): Unknown    Outpatient Encounter Medications as of 01/22/2022  Medication Sig   albuterol (PROVENTIL) (2.5 MG/3ML) 0.083% nebulizer solution Take by nebulization.   amLODipine (NORVASC) 10 MG tablet Take 10 mg by mouth daily.   apixaban (ELIQUIS) 5 MG TABS tablet Take 1 tablet (5 mg total) by mouth 2 (two) times daily.   Ascorbic Acid (VITAMIN C) 1000 MG tablet Take 1,000 mg by mouth daily.   benazepril (LOTENSIN) 10 MG tablet Take 1 tablet (10 mg total) by mouth daily.   bisacodyl (DULCOLAX) 10 MG suppository Place 10 mg rectally as needed for moderate constipation.   carbidopa-levodopa (SINEMET IR) 25-100 MG tablet Take 1.5 tablets by mouth at bedtime.   cholecalciferol (VITAMIN D3) 25 MCG (1000 UNIT) tablet Take 1,000 Units by mouth daily.   furosemide (LASIX) 40 MG tablet Take 1 tablet (40 mg total) by mouth daily.   hydrALAZINE (APRESOLINE) 25 MG tablet TAKE 1 TABLET BY MOUTH THREE TIMES DAILY   insulin aspart (NOVOLOG) 100 UNIT/ML injection Inject 5 Units into the skin. TWICE DAILY FOR CBG >200mg /dL   isosorbide mononitrate (IMDUR) 30 MG 24 hr tablet Take 1 tablet by mouth once daily   LANTUS SOLOSTAR 100 UNIT/ML Solostar Pen Inject 20 Units into the skin daily.   levothyroxine (SYNTHROID, LEVOTHROID) 100 MCG tablet Take 100 mcg by mouth daily before breakfast.   Magnesium Hydroxide (MILK OF MAGNESIA PO) Take 30 mLs by mouth as needed.   meclizine (ANTIVERT) 25 MG tablet Take 25 mg by mouth daily.   metoprolol succinate (TOPROL-XL) 25 MG 24 hr tablet Take 1 tablet by mouth once daily   nitroGLYCERIN (NITROSTAT) 0.4 MG SL tablet DISSOLVE ONE TABLET UNDER THE TONGUE EVERY 5 MINUTES AS NEEDED FOR CHEST PAIN.   NON FORMULARY Diet:Reg [facility]/thin diet consistency   nystatin (MYCOSTATIN/NYSTOP) powder APPLY TOPICALLY TO BUTTOCKS AND GROIN FOLLOWED BY A THIN LAYER OF BARRIER CREAM TWICE A DAY   ondansetron (ZOFRAN) 4 MG tablet Take 4 mg by mouth every 6 (six) hours  as needed for nausea.   oxyCODONE (ROXICODONE) 5 MG immediate release tablet Take 1 tablet (5 mg total) by mouth every 8 (eight) hours as needed for severe pain.   Oyster Shell Calcium 500 MG TABS TAKE 1 TABLET BY MOUTH ONCE DAILY FOR SUPPLEMENT   polyethylene glycol (MIRALAX / GLYCOLAX) 17 g packet Take 17 g by mouth daily.   pregabalin (LYRICA) 50 MG capsule Take 1 capsule (50 mg total) by mouth 2 (two) times daily.   senna-docusate (SENOKOT-S) 8.6-50 MG tablet Take 1 tablet by mouth 2 (two) times daily.   Sodium Phosphates (RA SALINE ENEMA RE) Place 1 Dose rectally as needed.   vitamin B-12 (CYANOCOBALAMIN) 1000 MCG tablet Take 1,000 mcg by mouth daily.   No facility-administered encounter medications on file as of  01/22/2022.    Review of Systems  Constitutional:  Negative for appetite change, chills, fatigue and fever.  HENT:  Negative for congestion, hearing loss, rhinorrhea and sore throat.   Eyes: Negative.   Respiratory:  Positive for cough. Negative for shortness of breath and wheezing.   Cardiovascular:  Positive for leg swelling. Negative for chest pain and palpitations.  Gastrointestinal:  Negative for abdominal pain, constipation, diarrhea, nausea and vomiting.  Genitourinary:  Negative for dysuria.  Skin:  Negative for color change, rash and wound.  Neurological:  Negative for dizziness, weakness and headaches.  Psychiatric/Behavioral:  Negative for behavioral problems. The patient is not nervous/anxious.       Immunization History  Administered Date(s) Administered   Influenza Split 09/13/2007, 09/02/2009, 09/02/2010, 09/03/2011, 08/19/2012, 09/22/2013, 08/29/2016, 08/14/2018   Influenza, High Dose Seasonal PF 08/14/2018, 09/30/2019   Influenza,inj,Quad PF,6+ Mos 11/09/2020   Influenza,inj,quad, With Preservative 08/05/2014, 10/18/2015   Janssen (J&J) SARS-COV-2 Vaccination 04/13/2020   Pfizer Covid-19 Vaccine Bivalent Booster 67yrs & up 01/04/2021   Pneumococcal  Conjugate-13 09/08/2008, 01/11/2015   Td 07/04/2007   Pertinent  Health Maintenance Due  Topic Date Due   OPHTHALMOLOGY EXAM  Never done   INFLUENZA VACCINE  07/03/2021   FOOT EXAM  01/25/2022   HEMOGLOBIN A1C  07/19/2022   DEXA SCAN  Completed   Fall Risk 10/21/2021 10/21/2021 10/22/2021 10/22/2021 10/24/2021  Falls in the past year? - - - - -  Was there an injury with Fall? - - - - -  Fall Risk Category Calculator - - - - -  Fall Risk Category - - - - -  Patient Fall Risk Level High fall risk High fall risk High fall risk High fall risk High fall risk  Patient at Risk for Falls Due to - - - - -  Fall risk Follow up - - - - -     Vitals:   01/22/22 1100  BP: (!) 135/56  Pulse: 65  Resp: 20  Temp: 98.6 F (37 C)  SpO2: 91%  Weight: 222 lb 12.8 oz (101.1 kg)  Height: 5\' 6"  (1.676 m)   Body mass index is 35.96 kg/m.  Physical Exam Constitutional:      Appearance: She is obese.  HENT:     Head: Normocephalic and atraumatic.     Nose: Nose normal.     Mouth/Throat:     Mouth: Mucous membranes are moist.  Eyes:     Conjunctiva/sclera: Conjunctivae normal.  Cardiovascular:     Rate and Rhythm: Normal rate and regular rhythm.  Pulmonary:     Effort: Pulmonary effort is normal.     Breath sounds: Normal breath sounds.  Abdominal:     General: Bowel sounds are normal.     Palpations: Abdomen is soft.  Musculoskeletal:     Cervical back: Normal range of motion.     Right lower leg: Edema present.     Comments: Right foot 2+edema  Skin:    General: Skin is warm and dry.  Neurological:     General: No focal deficit present.     Mental Status: She is alert and oriented to person, place, and time.  Psychiatric:        Mood and Affect: Mood normal.        Behavior: Behavior normal.        Thought Content: Thought content normal.        Judgment: Judgment normal.       Labs reviewed: Recent  Labs    10/13/21 0324 10/14/21 0339 10/20/21 0216 10/21/21 0156  10/22/21 0309 10/23/21 0551 10/24/21 1045 10/24/21 1111 12/29/21 0000 01/09/22 0000 01/18/22 0000  NA 133*   < > 131* 127* 128* 132* 136   < > 138 137 138  K 4.4   < > 4.3 4.8 4.8 4.9 4.7   < > 3.3* 3.2* 3.4  CL 106   < > 99 98 99 103 107   < > 99 97* 100  CO2 20*   < > 25 20* 20* 23 19*   < > 25* 29* 30*  GLUCOSE 173*   < > 128* 193* 173* 175* 190*  --   --   --   --   BUN 23   < > 35* 53* 63* 50* 35*   < > 13 17 15   CREATININE 1.14*   < > 1.45* 2.02* 2.04* 1.51* 1.16*   < > 0.6 0.8 0.8  CALCIUM 8.1*   < > 8.3* 8.0* 7.7* 7.9* 8.3*   < > 8.7 8.8 8.7  MG 1.6*  --  1.8 1.9  --   --   --   --   --   --   --    < > = values in this interval not displayed.   Recent Labs    10/11/21 1815 10/24/21 1045 01/09/22 0000  AST 14* 20 7*  ALT 8 20  --   ALKPHOS 81 89  --   BILITOT 0.4 0.6  --   PROT 5.8* 4.9*  --   ALBUMIN 3.4* 2.2* 3.4*   Recent Labs    10/22/21 0309 10/23/21 0551 10/24/21 1045 10/24/21 1111 11/22/21 0000 12/08/21 0000 12/29/21 0000 01/09/22 0000  WBC 16.7* 11.4* 7.8   < > 10.9 9.9 10.2 8.2  NEUTROABS 13.1* 8.2* 5.4  --   --  6.60 7.30 5.90  HGB 10.0* 10.7* 14.0   < > 13.4 12.6 12.8 13.4  HCT 30.5* 32.3* 43.3   < > 41 40  --  41  MCV 95.9 95.3 96.4  --   --   --   --   --   PLT 232 263 226   < > 236 196 190 163   < > = values in this interval not displayed.   Lab Results  Component Value Date   TSH 5.98 (A) 01/19/2022   Lab Results  Component Value Date   HGBA1C 8.5 01/19/2022   Lab Results  Component Value Date   CHOL 149 08/12/2018   HDL 34 (L) 08/12/2018   LDLCALC 81 08/12/2018   TRIG 171 (H) 08/12/2018   CHOLHDL 4.4 08/12/2018    Significant Diagnostic Results in last 30 days:  No results found.  Assessment/Plan  1. Leukocytosis, unspecified type -  wbc 15.1, elevated -  has been coughing with clear sputum -  will start with Guaifenesin 100 mg/5 ml give 10 ml = 200 mg PO Q 6AM, 2PM and 10 PM X 2 days -  will start on Rocephin 1 gm IM  daily X 7 days and Florastor 250 mg 1 capsule PO BID X 10 days  2. Type 2 diabetes mellitus with diabetic neuropathy, with long-term current use of insulin (HCC) Lab Results  Component Value Date   HGBA1C 8.5 01/19/2022   - CBGs stable, continue Novolog and Insulin Glargine -  monitor CBGs   Family/ staff Communication: Discussed plan of care with resident and charge nurse  Labs/tests  ordered:  CBC with differentials, BMP with GFR, chest x-ray, UA with CS    Kenard GowerMonina Medina-Vargas, DNP, MSN, FNP-BC [facility] [phone] (Monday-Friday 8:00 a.m. - 5:00 p.m.) [phone] (after hours)

## 2022-01-23 DIAGNOSIS — Z741 Need for assistance with personal care: Secondary | ICD-10-CM | POA: Diagnosis not present

## 2022-01-23 DIAGNOSIS — S82842D Displaced bimalleolar fracture of left lower leg, subsequent encounter for closed fracture with routine healing: Secondary | ICD-10-CM | POA: Diagnosis not present

## 2022-01-23 DIAGNOSIS — I739 Peripheral vascular disease, unspecified: Secondary | ICD-10-CM | POA: Diagnosis not present

## 2022-01-23 DIAGNOSIS — G2581 Restless legs syndrome: Secondary | ICD-10-CM | POA: Diagnosis not present

## 2022-01-23 DIAGNOSIS — M6281 Muscle weakness (generalized): Secondary | ICD-10-CM | POA: Diagnosis not present

## 2022-01-24 ENCOUNTER — Non-Acute Institutional Stay (SKILLED_NURSING_FACILITY): Payer: Medicare Other | Admitting: Internal Medicine

## 2022-01-24 ENCOUNTER — Encounter: Payer: Self-pay | Admitting: Internal Medicine

## 2022-01-24 ENCOUNTER — Other Ambulatory Visit: Payer: Self-pay | Admitting: Adult Health

## 2022-01-24 DIAGNOSIS — I5032 Chronic diastolic (congestive) heart failure: Secondary | ICD-10-CM

## 2022-01-24 DIAGNOSIS — I25709 Atherosclerosis of coronary artery bypass graft(s), unspecified, with unspecified angina pectoris: Secondary | ICD-10-CM | POA: Diagnosis not present

## 2022-01-24 DIAGNOSIS — I739 Peripheral vascular disease, unspecified: Secondary | ICD-10-CM

## 2022-01-24 DIAGNOSIS — E43 Unspecified severe protein-calorie malnutrition: Secondary | ICD-10-CM | POA: Diagnosis not present

## 2022-01-24 DIAGNOSIS — D631 Anemia in chronic kidney disease: Secondary | ICD-10-CM

## 2022-01-24 DIAGNOSIS — N1831 Chronic kidney disease, stage 3a: Secondary | ICD-10-CM | POA: Diagnosis not present

## 2022-01-24 DIAGNOSIS — I480 Paroxysmal atrial fibrillation: Secondary | ICD-10-CM | POA: Insufficient documentation

## 2022-01-24 MED ORDER — CIPROFLOXACIN HCL 500 MG PO TABS: 500.0000 mg | ORAL_TABLET | Freq: Two times a day (BID) | ORAL | 0 refills | Status: AC

## 2022-01-24 NOTE — Assessment & Plan Note (Addendum)
She does have persistent peripheral edema but no neck vein distention or hepatojugular reflux.  Rhonchi are present on exam.  Mobile imaging was reviewed and is worrisome for patchy infiltrates in the right upper lobe medially, right middle lobe, and possibly in the right lower lobe retrocardiac area.  The latter could also suggest some bronchiectasis. In context of the leukocytosis IM Rocephin x5 days with azithromycin orally was recommended.  Patient initially refusing Rocephin.  I have discussed the situation with her daughter outlining the potential risk of progression of pneumonia and rehospitalization.

## 2022-01-24 NOTE — Patient Instructions (Signed)
See assessment and plan under each diagnosis in the problem list and acutely for this visit 

## 2022-01-24 NOTE — Assessment & Plan Note (Signed)
She denies active anginal symptoms at this time but is on long-acting nitrate.

## 2022-01-24 NOTE — Assessment & Plan Note (Signed)
Rhythm is regular and rate well controlled.  Clinically there is no recurrence of A-fib.

## 2022-01-24 NOTE — Progress Notes (Signed)
NURSING HOME LOCATION:  [facility] ROOM NUMBER:  [id number]  CODE STATUS:  Full Code  PCP:  [name] MD  This is a nursing facility follow up visit of chronic medical diagnoses & to document compliance with Regulation 483.30 (c) in The [place] Manual Phase 2 which mandates caregiver visit ( visits can alternate among physician, PA or NP as per statutes) within 10 days of 30 days / 60 days/ 90 days post admission to SNF date    Interim medical record and care since last SNF visit was updated with review of diagnostic studies and change in clinical status since last visit were documented.  HPI: The patient has resided in this facility since she was hospitalized 11/9-11/21/2022 for right ankle bimalleolar acute displaced fracture left ankle indeterminate nondisplaced distal fibular fracture with lateral ankle subcutaneous soft tissue edema following mechanical fall.  Manual reduction was unsuccessful in the ED.  Comorbidities of PVD and severe osteoporosis were contraindications to surgical intervention as per Dr. Sharol Given.  He recommended nonweightbearing and nonsurgical management for 3 weeks. She did not receive Eliquis for a flutter/A-fib in the context of elevated CHA2DS 2-VASc.  She had AKI superimposed on CKD at the time of admission with stage IV renal disease.  At discharge creatinine was 1.51 and GFR 34 indicating CKD stage IIIb. She was actually originally discharged on 11/21 to the SNF but had to be rehospitalized with dyspnea and hypoxia.  Imaging suggested possible interstitial edema without frank heart failure and atelectasis.  BNP was 291. Serial labs have demonstrated improvement in the anemia to H/H of 10.9/33.7.  The CKD improved to stage IIIa with a creatinine of 0.94 and GFR 55.31.  Past medical history includes CAD with CABG, history of chronic depression, essential hyper tension, RLS, and hypothyroidism.  Most current labs were completed  2/7 and 16/2023 and reveal creatinine of 0.8 and GFR 55.31 indicating stable stage IIIa CKD.  She is on low-dose benazepril and low-dose pregabalin. Total protein is 4.9 and albumin 3.4 indicating protein/caloric malnutrition. Anemia had resolved with H/H of 13.4/41 with normal indices. On 2/7 white count was normal at 8200 with a normal differential.  On 2/20 white count was 15,100 with a left shift.The CBC was performed because of increased nausea as well as some cough.  Chest x-ray was personally reviewed.  This suggest cardiomegaly and calcium in the aortic knob.  There is suggestion of possible ill-defined right upper lobe honeycomb infiltrate medially as well as ill-defined tissue densities in the right mid field.  Also the increased markings are noted in the right lower lobe in the retrocardiac area.  The right hemidiaphragm is elevated.  She was placed on Rocephin IM but is now refusing this medicine because of pain at the injection site after the initial injection. TSH was 5.98, minimally in the hypothyroid range.  She is on L-thyroxine 100 mcg daily.Most current A1c was on 01/19/2022 with a value of 8.5%.  Review of systems: She denies any infectious symptoms at this time.  She has no GU symptoms other than incontinence.  She does have intermittent aching discomfort in the rectal area every few days.  This is relieved if she believes she has had a bowel movement.  She does not visualize the stool.  She does have a history of colonoscopy remotely which was negative. Appetite is impacted here as she does not like her dietary options. She denies active cardiovascular symptoms except for peripheral  edema.  Constitutional: No fever, significant weight change, fatigue  Eyes: No redness, discharge, pain, vision change ENT/mouth: No nasal congestion,  purulent discharge, earache, change in hearing, sore throat  Cardiovascular: No chest pain, palpitations, paroxysmal nocturnal dyspnea Respiratory: No  cough, sputum production, hemoptysis, significant snoring, apnea   Gastrointestinal: No heartburn, dysphagia, abdominal pain, nausea /vomiting, rectal bleeding, melena, change in bowels Genitourinary: No dysuria, hematuria, pyuria,nocturia Dermatologic: No rash, pruritus, change in appearance of skin Neurologic: No dizziness, headache, syncope, seizures, numbness, tingling Psychiatric: No significant anxiety, depression, insomnia, anorexia Endocrine: No change in hair/skin/nails, excessive thirst, excessive hunger, excessive urination  Hematologic/lymphatic: No significant  lymphadenopathy, abnormal bleeding Allergy/immunology: No itchy/watery eyes, significant sneezing, urticaria, angioedema  Physical exam:  Pertinent or positive findings: She is markedly hard of hearing.  Anisocoria is present with the right pupil greater than the left & distorted.  She is edentulous but wearing only the upper plate.  She has slight rhonchorous character to respirations.  Heart rate is slow and regular.  Abdomen is protuberant.  She has 1/2+ edema at the sock line and 1+ edema of the feet.  There is some weeping over the posterior calf on the right.  Pedal pulses are not palpable.  She has stasis hyperpigmentation changes on the right shin greater than left.  Superiorly on the right shin there is a small ecchymotic area with slight vesicular character.  She has slight isolated mixed DIP and PIP arthritic changes.  Interosseous wasting of the hands is present.  General appearance: no acute distress, increased work of breathing is present.   Lymphatic: No lymphadenopathy about the head, neck, axilla. Eyes: No conjunctival inflammation or lid edema is present. There is no scleral icterus. Ears:  External ear exam shows no significant lesions or deformities.   Nose:  External nasal examination shows no deformity or inflammation. Nasal mucosa are pink and moist without lesions, exudates Neck:  No thyromegaly, masses,  tenderness noted.    Heart:  No gallop, murmur, click, rub .  Lungs:  without wheezes, rales, rubs. Abdomen: Bowel sounds are normal. Abdomen is soft and nontender with no organomegaly, hernias, masses. GU: Deferred  Extremities:  No cyanosis, clubbing  Neurologic exam :Balance, Rhomberg, finger to nose testing could not be completed due to clinical state Skin: Warm & dry w/o tenting.  See summary under each active problem in the Problem List with associated updated therapeutic plan

## 2022-01-24 NOTE — Assessment & Plan Note (Addendum)
Albumin is minimally reduced at 3.4 but total protein significantly reduced to 4.9.  The low protein is contributing to the peripheral edema.  Nutritionist to consult at SNF.

## 2022-01-24 NOTE — Assessment & Plan Note (Signed)
Current H/H of 13.4/41 with normal indices.  Anemia has resolved.

## 2022-01-25 DIAGNOSIS — I739 Peripheral vascular disease, unspecified: Secondary | ICD-10-CM | POA: Diagnosis not present

## 2022-01-25 DIAGNOSIS — M6281 Muscle weakness (generalized): Secondary | ICD-10-CM | POA: Diagnosis not present

## 2022-01-25 DIAGNOSIS — Z741 Need for assistance with personal care: Secondary | ICD-10-CM | POA: Diagnosis not present

## 2022-01-25 DIAGNOSIS — G2581 Restless legs syndrome: Secondary | ICD-10-CM | POA: Diagnosis not present

## 2022-01-25 DIAGNOSIS — S82842D Displaced bimalleolar fracture of left lower leg, subsequent encounter for closed fracture with routine healing: Secondary | ICD-10-CM | POA: Diagnosis not present

## 2022-01-28 ENCOUNTER — Other Ambulatory Visit: Payer: Self-pay | Admitting: Adult Health

## 2022-01-28 DIAGNOSIS — S82899D Other fracture of unspecified lower leg, subsequent encounter for closed fracture with routine healing: Secondary | ICD-10-CM

## 2022-01-28 MED ORDER — OXYCODONE HCL 5 MG PO TABS: 5.0000 mg | ORAL_TABLET | Freq: Three times a day (TID) | ORAL | 0 refills | Status: DC | PRN

## 2022-01-29 DIAGNOSIS — S82842D Displaced bimalleolar fracture of left lower leg, subsequent encounter for closed fracture with routine healing: Secondary | ICD-10-CM | POA: Diagnosis not present

## 2022-01-29 DIAGNOSIS — Z741 Need for assistance with personal care: Secondary | ICD-10-CM | POA: Diagnosis not present

## 2022-01-29 DIAGNOSIS — M6281 Muscle weakness (generalized): Secondary | ICD-10-CM | POA: Diagnosis not present

## 2022-01-29 DIAGNOSIS — I739 Peripheral vascular disease, unspecified: Secondary | ICD-10-CM | POA: Diagnosis not present

## 2022-01-29 DIAGNOSIS — I10 Essential (primary) hypertension: Secondary | ICD-10-CM | POA: Diagnosis not present

## 2022-01-29 DIAGNOSIS — G2581 Restless legs syndrome: Secondary | ICD-10-CM | POA: Diagnosis not present

## 2022-01-29 LAB — BASIC METABOLIC PANEL
BUN: 11 (ref 4–21)
CO2: 29 — AB (ref 13–22)
Chloride: 96 — AB (ref 99–108)
Creatinine: 0.7 (ref 0.5–1.1)
Glucose: 250
Potassium: 3.4 mEq/L — AB (ref 3.5–5.1)
Sodium: 136 — AB (ref 137–147)

## 2022-01-29 LAB — CBC AND DIFFERENTIAL
HCT: 41 (ref 36–46)
Hemoglobin: 13.6 (ref 12.0–16.0)
Platelets: 186 10*3/uL (ref 150–400)
WBC: 8.2

## 2022-01-29 LAB — COMPREHENSIVE METABOLIC PANEL: Calcium: 8.6 — AB (ref 8.7–10.7)

## 2022-01-29 LAB — CBC: RBC: 4.6 (ref 3.87–5.11)

## 2022-01-30 DIAGNOSIS — S82842D Displaced bimalleolar fracture of left lower leg, subsequent encounter for closed fracture with routine healing: Secondary | ICD-10-CM | POA: Diagnosis not present

## 2022-01-30 DIAGNOSIS — I739 Peripheral vascular disease, unspecified: Secondary | ICD-10-CM | POA: Diagnosis not present

## 2022-01-30 DIAGNOSIS — Z741 Need for assistance with personal care: Secondary | ICD-10-CM | POA: Diagnosis not present

## 2022-01-30 DIAGNOSIS — M6281 Muscle weakness (generalized): Secondary | ICD-10-CM | POA: Diagnosis not present

## 2022-01-30 DIAGNOSIS — E1140 Type 2 diabetes mellitus with diabetic neuropathy, unspecified: Secondary | ICD-10-CM | POA: Diagnosis not present

## 2022-01-30 DIAGNOSIS — G2581 Restless legs syndrome: Secondary | ICD-10-CM | POA: Diagnosis not present

## 2022-01-30 LAB — CBC AND DIFFERENTIAL
HCT: 39 (ref 36–46)
Hemoglobin: 12.5 (ref 12.0–16.0)
Platelets: 180 10*3/uL (ref 150–400)
WBC: 8

## 2022-01-30 LAB — CBC: RBC: 4.38 (ref 3.87–5.11)

## 2022-02-01 DIAGNOSIS — M6281 Muscle weakness (generalized): Secondary | ICD-10-CM | POA: Diagnosis not present

## 2022-02-01 DIAGNOSIS — I5032 Chronic diastolic (congestive) heart failure: Secondary | ICD-10-CM | POA: Diagnosis not present

## 2022-02-01 DIAGNOSIS — R2681 Unsteadiness on feet: Secondary | ICD-10-CM | POA: Diagnosis not present

## 2022-02-01 DIAGNOSIS — S82842D Displaced bimalleolar fracture of left lower leg, subsequent encounter for closed fracture with routine healing: Secondary | ICD-10-CM | POA: Diagnosis not present

## 2022-02-02 DIAGNOSIS — R2681 Unsteadiness on feet: Secondary | ICD-10-CM | POA: Diagnosis not present

## 2022-02-02 DIAGNOSIS — I5032 Chronic diastolic (congestive) heart failure: Secondary | ICD-10-CM | POA: Diagnosis not present

## 2022-02-02 DIAGNOSIS — S82842D Displaced bimalleolar fracture of left lower leg, subsequent encounter for closed fracture with routine healing: Secondary | ICD-10-CM | POA: Diagnosis not present

## 2022-02-02 DIAGNOSIS — I5022 Chronic systolic (congestive) heart failure: Secondary | ICD-10-CM | POA: Diagnosis not present

## 2022-02-02 DIAGNOSIS — M6281 Muscle weakness (generalized): Secondary | ICD-10-CM | POA: Diagnosis not present

## 2022-02-05 DIAGNOSIS — S82842D Displaced bimalleolar fracture of left lower leg, subsequent encounter for closed fracture with routine healing: Secondary | ICD-10-CM | POA: Diagnosis not present

## 2022-02-05 DIAGNOSIS — I5032 Chronic diastolic (congestive) heart failure: Secondary | ICD-10-CM | POA: Diagnosis not present

## 2022-02-05 DIAGNOSIS — R2681 Unsteadiness on feet: Secondary | ICD-10-CM | POA: Diagnosis not present

## 2022-02-05 DIAGNOSIS — M6281 Muscle weakness (generalized): Secondary | ICD-10-CM | POA: Diagnosis not present

## 2022-02-07 DIAGNOSIS — M6281 Muscle weakness (generalized): Secondary | ICD-10-CM | POA: Diagnosis not present

## 2022-02-07 DIAGNOSIS — I5032 Chronic diastolic (congestive) heart failure: Secondary | ICD-10-CM | POA: Diagnosis not present

## 2022-02-07 DIAGNOSIS — S82842D Displaced bimalleolar fracture of left lower leg, subsequent encounter for closed fracture with routine healing: Secondary | ICD-10-CM | POA: Diagnosis not present

## 2022-02-07 DIAGNOSIS — R2681 Unsteadiness on feet: Secondary | ICD-10-CM | POA: Diagnosis not present

## 2022-02-08 ENCOUNTER — Encounter: Payer: Self-pay | Admitting: Adult Health

## 2022-02-08 ENCOUNTER — Non-Acute Institutional Stay (SKILLED_NURSING_FACILITY): Payer: Medicare Other | Admitting: Adult Health

## 2022-02-08 DIAGNOSIS — I5032 Chronic diastolic (congestive) heart failure: Secondary | ICD-10-CM

## 2022-02-08 NOTE — Progress Notes (Unsigned)
Location:  [facility] Room Number: 319-A Place of Service:  SNF (31) Provider:  Kenard Gower, DNP, FNP-BC  Patient Care Team: [name], MD as PCP - General (Family Medicine) [name], Deloris Ping, MD as PCP - Cardiology (Cardiology) Glendale Chard, DO as Consulting Physician (Neurology)  Extended Emergency Contact Information Primary Emergency Contact: Kai Levins Address: [address]          [place], [place] [address] D[place] of [place] Home Phone: [phone] Relation: Spouse Secondary Emergency Contact: DENNIS,SHERRIE Mobile Phone: [phone] Relation: Daughter  Code Status:  FULL CODE  Goals of care: Advanced Directive information Advanced Directives 02/08/2022  Does Patient Have a Medical Advance Directive? No  Type of Advance Directive -  Would patient like information on creating a medical advance directive? No - Patient declined     Chief Complaint  Patient presents with   Acute Visit    Weight Gain.    HPI:  Pt is a 88 y.o. female seen today for medical management of chronic diseases.  ***   Past Medical History:  Diagnosis Date   Arthritis    Coronary artery disease 1999   CABG   Depression    Diabetes mellitus with vascular disease    type II; metformin and Amaryl   Diabetic neuropathy (HCC)    History of anemia as a child    HOH (hard of hearing)    Hypertension    Hypothyroidism    synthroid   PONV (postoperative nausea and vomiting)    Restless leg syndrome    S/P cardiac cath 08/13/18 stable CAD 08/14/2018   Shortness of breath 09/03/2012   ECHO- The LA is Moderately Dilated, mild mitral annular calcification. mild pulmonary hypertension, moderate concentric LV hypertrophy. Atrial septum is aneurysmal. Aortic valve appears to be mildly sclerotic.EF 88%   Urinary incontinence    Weakness of both legs    Past Surgical History:  Procedure Laterality Date   ABDOMINAL HYSTERECTOMY      APPENDECTOMY     BACK SURGERY     CARDIAC CATHERIZATION  08/20/03   Widely patent bypass grafts. Normal left ventricular systolic function.   CARDIAC CATHETERIZATION     CHOLECYSTECTOMY     COLONOSCOPY     CORONARY ARTERY BYPASS GRAFT     EYE SURGERY     bilateral cataracts   LEFT HEART CATH AND CORONARY ANGIOGRAPHY N/A 08/13/2018   Procedure: LEFT HEART CATH AND CORONARY ANGIOGRAPHY;  Surgeon: Kathleene Hazel, MD;  Location: [facility];  Service: Cardiovascular;  Laterality: N/A;   LUMBAR LAMINECTOMY/DECOMPRESSION MICRODISCECTOMY N/A 07/26/2015   Procedure: BILATERAL Lumbar 3-4 SUBTOTAL HEMILAMINECTOMY WITH LATERAL RECESS DECOMPRESSION;  Surgeon: Kerrin Champagne, MD;  Location: [facility] OR;  Service: Orthopedics;  Laterality: N/A;   SHOULDER SURGERY     TUBAL LIGATION      Allergies  Allergen Reactions   Duloxetine Hcl Nausea And Vomiting and Other (See Comments)   Statins Other (See Comments)    Causes myalgias Other reaction(s): Unknown   Amitriptyline Other (See Comments)    Pt. States that it caused it to be suicidal.    Benadryl [Diphenhydramine Hcl (Sleep)] Other (See Comments)    Makes the patient very "hyper"   Ezetimibe     Other reaction(s): Unknown Other reaction(s): Unknown   Gabapentin Other (See Comments)   Metformin Hcl     Other reaction(s): Unknown Other reaction(s): Unknown   Other     Other reaction(s): Unknown  Sitagliptin     Other reaction(s): Unknown Other reaction(s): Unknown   Tylenol [Acetaminophen] Nausea Only   Colesevelam Hcl Other (See Comments)    Causes dizziness Other reaction(s): Unknown    Outpatient Encounter Medications as of 02/08/2022  Medication Sig   albuterol (PROVENTIL) (2.5 MG/3ML) 0.083% nebulizer solution Take by nebulization.   amLODipine (NORVASC) 10 MG tablet Take 10 mg by mouth daily.   apixaban (ELIQUIS) 5 MG TABS tablet Take 1 tablet (5 mg total) by mouth 2 (two) times daily.   Ascorbic Acid (VITAMIN C) 1000  MG tablet Take 1,000 mg by mouth daily.   benazepril (LOTENSIN) 10 MG tablet Take 1 tablet (10 mg total) by mouth daily.   bisacodyl (DULCOLAX) 10 MG suppository Place 10 mg rectally as needed for moderate constipation.   carbidopa-levodopa (SINEMET IR) 25-100 MG tablet Take 1.5 tablets by mouth at bedtime.   cholecalciferol (VITAMIN D3) 25 MCG (1000 UNIT) tablet Take 1,000 Units by mouth daily.   furosemide (LASIX) 40 MG tablet Take 1 tablet (40 mg total) by mouth daily.   hydrALAZINE (APRESOLINE) 25 MG tablet TAKE 1 TABLET BY MOUTH THREE TIMES DAILY   insulin aspart (NOVOLOG) 100 UNIT/ML injection Inject 5 Units into the skin. TWICE DAILY FOR CBG >200mg /dL   isosorbide mononitrate (IMDUR) 30 MG 24 hr tablet Take 1 tablet by mouth once daily   LANTUS SOLOSTAR 100 UNIT/ML Solostar Pen Inject 20 Units into the skin daily.   levothyroxine (SYNTHROID, LEVOTHROID) 100 MCG tablet Take 100 mcg by mouth daily before breakfast.   Magnesium Hydroxide (MILK OF MAGNESIA PO) Take 30 mLs by mouth as needed.   meclizine (ANTIVERT) 25 MG tablet Take 25 mg by mouth daily.   metoprolol succinate (TOPROL-XL) 25 MG 24 hr tablet Take 1 tablet by mouth once daily   nitroGLYCERIN (NITROSTAT) 0.4 MG SL tablet DISSOLVE ONE TABLET UNDER THE TONGUE EVERY 5 MINUTES AS NEEDED FOR CHEST PAIN.   NON FORMULARY Diet:Reg CCD HH/thin diet consistency   nystatin (MYCOSTATIN/NYSTOP) powder APPLY TOPICALLY TO BUTTOCKS AND GROIN FOLLOWED BY A THIN LAYER OF BARRIER CREAM TWICE A DAY   ondansetron (ZOFRAN) 4 MG tablet Take 4 mg by mouth every 6 (six) hours as needed for nausea.   oxyCODONE (ROXICODONE) 5 MG immediate release tablet Take 1 tablet (5 mg total) by mouth every 8 (eight) hours as needed for severe pain.   Oyster Shell Calcium 500 MG TABS TAKE 1 TABLET BY MOUTH ONCE DAILY FOR SUPPLEMENT   pregabalin (LYRICA) 50 MG capsule Take 1 capsule (50 mg total) by mouth 2 (two) times daily.   Sodium Phosphates (RA SALINE ENEMA RE)  Place 1 Dose rectally as needed.   vitamin B-12 (CYANOCOBALAMIN) 1000 MCG tablet Take 1,000 mcg by mouth daily.   [DISCONTINUED] polyethylene glycol (MIRALAX / GLYCOLAX) 17 g packet Take 17 g by mouth daily.   [DISCONTINUED] senna-docusate (SENOKOT-S) 8.6-50 MG tablet Take 1 tablet by mouth 2 (two) times daily.   No facility-administered encounter medications on file as of 02/08/2022.    Review of Systems ***    Immunization History  Administered Date(s) Administered   Influenza Split 09/13/2007, 09/02/2009, 09/02/2010, 09/03/2011, 08/19/2012, 09/22/2013, 08/29/2016, 08/14/2018   Influenza, High Dose Seasonal PF 08/14/2018, 09/30/2019   Influenza, Quadrivalent, Recombinant, Inj, Pf 08/27/2017, 08/30/2021   Influenza,inj,Quad PF,6+ Mos 11/09/2020   Influenza,inj,quad, With Preservative 08/05/2014, 10/18/2015   Janssen (J&J) SARS-COV-2 Vaccination 04/13/2020   Pfizer Covid-19 Vaccine Bivalent Booster 81yrs & up 01/04/2021   Pneumococcal  Conjugate-13 09/08/2008, 01/11/2015   Td 07/04/2007   Pertinent  Health Maintenance Due  Topic Date Due   OPHTHALMOLOGY EXAM  Never done   FOOT EXAM  01/25/2022   HEMOGLOBIN A1C  07/19/2022   INFLUENZA VACCINE  Completed   DEXA SCAN  Completed   Fall Risk 10/21/2021 10/21/2021 10/22/2021 10/22/2021 10/24/2021  Falls in the past year? - - - - -  Was there an injury with Fall? - - - - -  Fall Risk Category Calculator - - - - -  Fall Risk Category - - - - -  Patient Fall Risk Level High fall risk High fall risk High fall risk High fall risk High fall risk  Patient at Risk for Falls Due to - - - - -  Fall risk Follow up - - - - -     Vitals:   02/08/22 1609  BP: 131/61  Pulse: 60  Resp: 20  Temp: (!) 97.5 F (36.4 C)  Weight: 234 lb 3.2 oz (106.2 kg)  Height: 5\' 6"  (1.676 m)   Body mass index is 37.8 kg/m.  Physical Exam     Labs reviewed: Recent Labs    10/13/21 0324 10/14/21 0339 10/20/21 0216 10/21/21 0156 10/22/21 0309  10/23/21 0551 10/24/21 1045 10/24/21 1111 01/18/22 0000 01/22/22 0000 01/29/22 0000  NA 133*   < > 131* 127* 128* 132* 136   < > 138 139 136*  K 4.4   < > 4.3 4.8 4.8 4.9 4.7   < > 3.4 3.8 3.4*  CL 106   < > 99 98 99 103 107   < > 100 98* 96*  CO2 20*   < > 25 20* 20* 23 19*   < > 30* 28* 29*  GLUCOSE 173*   < > 128* 193* 173* 175* 190*  --   --   --   --   BUN 23   < > 35* 53* 63* 50* 35*   < > 15 14 11   CREATININE 1.14*   < > 1.45* 2.02* 2.04* 1.51* 1.16*   < > 0.8 0.8 0.7  CALCIUM 8.1*   < > 8.3* 8.0* 7.7* 7.9* 8.3*   < > 8.7 8.7 8.6*  MG 1.6*  --  1.8 1.9  --   --   --   --   --   --   --    < > = values in this interval not displayed.   Recent Labs    10/11/21 1815 10/24/21 1045 01/09/22 0000  AST 14* 20 7*  ALT 8 20  --   ALKPHOS 81 89  --   BILITOT 0.4 0.6  --   PROT 5.8* 4.9*  --   ALBUMIN 3.4* 2.2* 3.4*   Recent Labs    10/22/21 0309 10/23/21 0551 10/24/21 1045 10/24/21 1111 12/08/21 0000 12/29/21 0000 01/09/22 0000 01/22/22 0000 01/29/22 0000 01/30/22 0000  WBC 16.7* 11.4* 7.8   < > 9.9 10.2 8.2 15.1 8.2 8.0  NEUTROABS 13.1* 8.2* 5.4  --  6.60 7.30 5.90  --   --   --   HGB 10.0* 10.7* 14.0   < > 12.6 12.8 13.4 12.7 13.6 12.5  HCT 30.5* 32.3* 43.3   < > 40  --  41 41 41 39  MCV 95.9 95.3 96.4  --   --   --   --   --   --   --   PLT 232 263 226   < >  196 190 163 187 186 180   < > = values in this interval not displayed.   Lab Results  Component Value Date   TSH 5.98 (A) 01/19/2022   Lab Results  Component Value Date   HGBA1C 8.5 01/19/2022   Lab Results  Component Value Date   CHOL 149 08/12/2018   HDL 34 (L) 08/12/2018   LDLCALC 81 08/12/2018   TRIG 171 (H) 08/12/2018   CHOLHDL 4.4 08/12/2018    Significant Diagnostic Results in last 30 days:  No results found.  Assessment/Plan ***   Family/ staff Communication: Discussed plan of care with resident and charge nurse  Labs/tests ordered:     Kenard GowerMonina Medina-Vargas, DNP, MSN,  FNP-BC [facility] [phone] (Monday-Friday 8:00 a.m. - 5:00 p.m.) [phone] (after hours)

## 2022-02-09 DIAGNOSIS — I0981 Rheumatic heart failure: Secondary | ICD-10-CM | POA: Diagnosis not present

## 2022-02-09 DIAGNOSIS — R059 Cough, unspecified: Secondary | ICD-10-CM | POA: Diagnosis not present

## 2022-02-12 DIAGNOSIS — I10 Essential (primary) hypertension: Secondary | ICD-10-CM | POA: Diagnosis not present

## 2022-02-12 DIAGNOSIS — M6281 Muscle weakness (generalized): Secondary | ICD-10-CM | POA: Diagnosis not present

## 2022-02-12 DIAGNOSIS — S82842D Displaced bimalleolar fracture of left lower leg, subsequent encounter for closed fracture with routine healing: Secondary | ICD-10-CM | POA: Diagnosis not present

## 2022-02-12 DIAGNOSIS — I5032 Chronic diastolic (congestive) heart failure: Secondary | ICD-10-CM | POA: Diagnosis not present

## 2022-02-12 DIAGNOSIS — R2681 Unsteadiness on feet: Secondary | ICD-10-CM | POA: Diagnosis not present

## 2022-02-12 LAB — BASIC METABOLIC PANEL
BUN: 13 (ref 4–21)
CO2: 30 — AB (ref 13–22)
Chloride: 96 — AB (ref 99–108)
Creatinine: 0.8 (ref 0.5–1.1)
Glucose: 299
Potassium: 3.4 mEq/L — AB (ref 3.5–5.1)
Sodium: 138 (ref 137–147)

## 2022-02-12 LAB — COMPREHENSIVE METABOLIC PANEL: Calcium: 8.8 (ref 8.7–10.7)

## 2022-02-13 ENCOUNTER — Other Ambulatory Visit: Payer: Self-pay

## 2022-02-13 ENCOUNTER — Emergency Department (HOSPITAL_COMMUNITY): Payer: Medicare Other

## 2022-02-13 ENCOUNTER — Other Ambulatory Visit: Payer: Self-pay | Admitting: Adult Health

## 2022-02-13 ENCOUNTER — Encounter (HOSPITAL_COMMUNITY): Payer: Self-pay

## 2022-02-13 ENCOUNTER — Emergency Department (HOSPITAL_COMMUNITY)
Admission: EM | Admit: 2022-02-13 | Discharge: 2022-02-14 | Disposition: A | Discharge status: Skilled Nursing Facility | Payer: Medicare Other | Attending: Emergency Medicine | Admitting: Emergency Medicine

## 2022-02-13 DIAGNOSIS — I4891 Unspecified atrial fibrillation: Secondary | ICD-10-CM | POA: Diagnosis not present

## 2022-02-13 DIAGNOSIS — Z794 Long term (current) use of insulin: Secondary | ICD-10-CM | POA: Insufficient documentation

## 2022-02-13 DIAGNOSIS — I509 Heart failure, unspecified: Secondary | ICD-10-CM | POA: Diagnosis not present

## 2022-02-13 DIAGNOSIS — Z20822 Contact with and (suspected) exposure to covid-19: Secondary | ICD-10-CM | POA: Insufficient documentation

## 2022-02-13 DIAGNOSIS — R739 Hyperglycemia, unspecified: Secondary | ICD-10-CM | POA: Diagnosis not present

## 2022-02-13 DIAGNOSIS — Z743 Need for continuous supervision: Secondary | ICD-10-CM | POA: Diagnosis not present

## 2022-02-13 DIAGNOSIS — J90 Pleural effusion, not elsewhere classified: Secondary | ICD-10-CM | POA: Diagnosis not present

## 2022-02-13 DIAGNOSIS — R0602 Shortness of breath: Secondary | ICD-10-CM | POA: Diagnosis not present

## 2022-02-13 DIAGNOSIS — I110 Hypertensive heart disease with heart failure: Secondary | ICD-10-CM | POA: Insufficient documentation

## 2022-02-13 DIAGNOSIS — J9811 Atelectasis: Secondary | ICD-10-CM | POA: Diagnosis not present

## 2022-02-13 DIAGNOSIS — Z79899 Other long term (current) drug therapy: Secondary | ICD-10-CM | POA: Insufficient documentation

## 2022-02-13 DIAGNOSIS — Z7901 Long term (current) use of anticoagulants: Secondary | ICD-10-CM | POA: Insufficient documentation

## 2022-02-13 DIAGNOSIS — I5032 Chronic diastolic (congestive) heart failure: Secondary | ICD-10-CM | POA: Insufficient documentation

## 2022-02-13 DIAGNOSIS — E039 Hypothyroidism, unspecified: Secondary | ICD-10-CM | POA: Insufficient documentation

## 2022-02-13 DIAGNOSIS — Z955 Presence of coronary angioplasty implant and graft: Secondary | ICD-10-CM | POA: Diagnosis not present

## 2022-02-13 DIAGNOSIS — I2510 Atherosclerotic heart disease of native coronary artery without angina pectoris: Secondary | ICD-10-CM | POA: Insufficient documentation

## 2022-02-13 DIAGNOSIS — E1159 Type 2 diabetes mellitus with other circulatory complications: Secondary | ICD-10-CM | POA: Diagnosis not present

## 2022-02-13 DIAGNOSIS — R6889 Other general symptoms and signs: Secondary | ICD-10-CM | POA: Diagnosis not present

## 2022-02-13 DIAGNOSIS — E1140 Type 2 diabetes mellitus with diabetic neuropathy, unspecified: Secondary | ICD-10-CM | POA: Insufficient documentation

## 2022-02-13 DIAGNOSIS — R059 Cough, unspecified: Secondary | ICD-10-CM | POA: Diagnosis not present

## 2022-02-13 LAB — TROPONIN I (HIGH SENSITIVITY)
Troponin I (High Sensitivity): 28 ng/L — ABNORMAL HIGH (ref <18)
Troponin I (High Sensitivity): 25 ng/L — ABNORMAL HIGH (ref <18)

## 2022-02-13 LAB — COMPREHENSIVE METABOLIC PANEL
ALT: 11 U/L (ref 0–44)
AST: 15 U/L (ref 15–41)
Albumin: 3.2 g/dL — ABNORMAL LOW (ref 3.5–5.0)
Alkaline Phosphatase: 72 U/L (ref 38–126)
Anion gap: 9 (ref 5–15)
BUN: 9 mg/dL (ref 8–23)
CO2: 30 mmol/L (ref 22–32)
Calcium: 8.9 mg/dL (ref 8.9–10.3)
Chloride: 97 mmol/L — ABNORMAL LOW (ref 98–111)
Creatinine, Ser: 0.94 mg/dL (ref 0.44–1.00)
GFR, Estimated: 59 mL/min — ABNORMAL LOW (ref >60)
Glucose, Bld: 318 mg/dL — ABNORMAL HIGH (ref 70–99)
Potassium: 3.1 mmol/L — ABNORMAL LOW (ref 3.5–5.1)
Sodium: 136 mmol/L (ref 135–145)
Total Bilirubin: 1.1 mg/dL (ref 0.3–1.2)
Total Protein: 5.9 g/dL — ABNORMAL LOW (ref 6.5–8.1)

## 2022-02-13 LAB — CBC WITH DIFFERENTIAL/PLATELET
Abs Immature Granulocytes: 0.04 10*3/uL (ref 0.00–0.07)
Basophils Absolute: 0.1 10*3/uL (ref 0.0–0.1)
Basophils Relative: 1 %
Eosinophils Absolute: 0.2 10*3/uL (ref 0.0–0.5)
Eosinophils Relative: 3 %
HCT: 43.2 % (ref 36.0–46.0)
Hemoglobin: 14 g/dL (ref 12.0–15.0)
Immature Granulocytes: 1 %
Lymphocytes Relative: 18 %
Lymphs Abs: 1.4 10*3/uL (ref 0.7–4.0)
MCH: 28.8 pg (ref 26.0–34.0)
MCHC: 32.4 g/dL (ref 30.0–36.0)
MCV: 88.9 fL (ref 80.0–100.0)
Monocytes Absolute: 0.5 10*3/uL (ref 0.1–1.0)
Monocytes Relative: 6 %
Neutro Abs: 5.9 10*3/uL (ref 1.7–7.7)
Neutrophils Relative %: 71 %
Platelets: 233 10*3/uL (ref 150–400)
RBC: 4.86 MIL/uL (ref 3.87–5.11)
RDW: 14.7 % (ref 11.5–15.5)
WBC: 8.1 10*3/uL (ref 4.0–10.5)
nRBC: 0 % (ref 0.0–0.2)

## 2022-02-13 LAB — LACTIC ACID, PLASMA: Lactic Acid, Venous: 2.3 mmol/L (ref 0.5–1.9)

## 2022-02-13 LAB — RESP PANEL BY RT-PCR (FLU A&B, COVID) ARPGX2
Influenza A by PCR: NEGATIVE
Influenza B by PCR: NEGATIVE
SARS Coronavirus 2 by RT PCR: NEGATIVE

## 2022-02-13 LAB — BRAIN NATRIURETIC PEPTIDE: B Natriuretic Peptide: 150.2 pg/mL — ABNORMAL HIGH (ref 0.0–100.0)

## 2022-02-13 MED ORDER — POTASSIUM CHLORIDE CRYS ER 20 MEQ PO TBCR
40.0000 meq | EXTENDED_RELEASE_TABLET | Freq: Once | ORAL | Status: AC
  Administered 2022-02-13: 40 meq via ORAL
  Filled 2022-02-13: qty 2

## 2022-02-13 MED ORDER — FUROSEMIDE 20 MG PO TABS: 60.0000 mg | ORAL_TABLET | Freq: Every day | ORAL | 0 refills | Status: DC

## 2022-02-13 MED ORDER — FUROSEMIDE 10 MG/ML IJ SOLN
60.0000 mg | Freq: Once | INTRAMUSCULAR | Status: AC
Start: 2022-02-13 — End: 2022-02-13
  Administered 2022-02-13: 60 mg via INTRAVENOUS
  Filled 2022-02-13: qty 6

## 2022-02-13 MED ORDER — PREGABALIN 50 MG PO CAPS: 50.0000 mg | ORAL_CAPSULE | Freq: Two times a day (BID) | ORAL | 0 refills | Status: DC

## 2022-02-13 MED ORDER — POTASSIUM CHLORIDE CRYS ER 20 MEQ PO TBCR: 20.0000 meq | EXTENDED_RELEASE_TABLET | Freq: Two times a day (BID) | ORAL | 0 refills | Status: DC

## 2022-02-13 NOTE — ED Notes (Signed)
Dinner tray delivered. Daughter at bedside. ?

## 2022-02-13 NOTE — ED Triage Notes (Signed)
Pt BIB [facility] x1 week. Chest xray showed CHF. Pt was on 40 mg of lasix and was increased to 80 with some improvement but not much. 91-93% RA. EMS placed pt on 2L [place]. Pt has a nasty cough present. Pt denies any pain but states she is freezing. ?

## 2022-02-13 NOTE — ED Provider Notes (Signed)
?[facility] ?Provider Note ? ? ?CSN: [id number] ?Arrival date & time: 02/13/22  0850 ? ?  ? ?History ? ?No chief complaint on file. ? ? ?Sierra Wright is a 88 y.o. female. ? ?HPI ? ?  ? ?88 year old female with a history of coronary artery disease status post CABG, chronic diastolic heart failure, paroxysmal atrial fibrillation on Eliquis diabetes, hypertension, hypothyroidism, who presents from [facility] rehab (where she has resided since she was hospitalized for bilateral ankle fractures in November) with concern for shortness of breath. ? ?Her chest x-ray showed CHF and she was put on 40 mg of Lasix and was increased to 80 mg without much improvement.  Her oxygen saturations were 91 to 93% on room air and EMS placed her on 2 L nasal cannula. ? ?2/20 had elevated WBC, was initially placed on rocephin IM but declined because of pain, azithromycin x5 days ? ?Daughter reports that she has had shortness of breath waxing and waning over the last week, but has increased over the last few days.  Patient reports significant orthopnea, with inability to lay flat due to shortness of breath.  Reports that they check her weights every other day at [facility] and that her weight has been increasing.  The lower extremity edema has also been increasing.  They increased her Lasix at the facility from 40 to 80 mg on Friday and have not noticed a significant change.  She still having dyspnea at rest.  Reports that she will have a "twinge of chest pain" every once in a while, with last twinge of chest pain yesterday.  Denies asymmetric leg pain or swelling.  Reports that she had nausea vomiting and loose stool on Friday, however those symptoms have improved.  She denies fever, chills ? ?Past Medical History:  ?Diagnosis Date  ? Arthritis   ? Coronary artery disease 1999  ? CABG  ? Depression   ? Diabetes mellitus with vascular disease   ? type II; metformin and Amaryl  ? Diabetic neuropathy  ([place])   ? History of anemia as a child   ? HOH (hard of hearing)   ? Hypertension   ? Hypothyroidism   ? synthroid  ? PONV (postoperative nausea and vomiting)   ? Restless leg syndrome   ? S/P cardiac cath 08/13/18 stable CAD 08/14/2018  ? Shortness of breath 09/03/2012  ? ECHO- The LA is Moderately Dilated, mild mitral annular calcification. mild pulmonary hypertension, moderate concentric LV hypertrophy. Atrial septum is aneurysmal. Aortic valve appears to be mildly sclerotic.EF 88%  ? Urinary incontinence   ? Weakness of both legs   ?  ?Past Surgical History:  ?Procedure Laterality Date  ? ABDOMINAL HYSTERECTOMY    ? APPENDECTOMY    ? BACK SURGERY    ? CARDIAC CATHERIZATION  08/20/03  ? Widely patent bypass grafts. Normal left ventricular systolic function.  ? CARDIAC CATHETERIZATION    ? CHOLECYSTECTOMY    ? COLONOSCOPY    ? CORONARY ARTERY BYPASS GRAFT    ? EYE SURGERY    ? bilateral cataracts  ? LEFT HEART CATH AND CORONARY ANGIOGRAPHY N/A 08/13/2018  ? Procedure: LEFT HEART CATH AND CORONARY ANGIOGRAPHY;  Surgeon: Burnell Blanks, MD;  Location: [facility];  Service: Cardiovascular;  Laterality: N/A;  ? LUMBAR LAMINECTOMY/DECOMPRESSION MICRODISCECTOMY N/A 07/26/2015  ? Procedure: BILATERAL Lumbar 3-4 SUBTOTAL HEMILAMINECTOMY WITH LATERAL RECESS DECOMPRESSION;  Surgeon: Jessy Oto, MD;  Location: [place];  Service: Orthopedics;  Laterality: N/A;  ? SHOULDER SURGERY    ? TUBAL LIGATION    ?  ? ?Home Medications ?Prior to Admission medications   ?Medication Sig Start Date End Date Taking? Authorizing Provider  ?furosemide (LASIX) 20 MG tablet Take 3 tablets (60 mg total) by mouth daily for 3 days. 02/13/22 02/16/22 Yes Gareth Morgan, MD  ?potassium chloride SA (KLOR-CON M) 20 MEQ tablet Take 1 tablet (20 mEq total) by mouth 2 (two) times daily for 3 days. 02/13/22 02/16/22 Yes Gareth Morgan, MD  ?albuterol (PROVENTIL) (2.5 MG/3ML) 0.083% nebulizer solution Take by nebulization. 12/28/21    [provider]  ?amLODipine (NORVASC) 10 MG tablet Take 10 mg by mouth daily.    [provider]  ?apixaban (ELIQUIS) 5 MG TABS tablet Take 1 tablet (5 mg total) by mouth 2 (two) times daily. 10/18/21   Domenic Polite, MD  ?Ascorbic Acid (VITAMIN C) 1000 MG tablet Take 1,000 mg by mouth daily.    [provider]  ?benazepril (LOTENSIN) 10 MG tablet Take 1 tablet (10 mg total) by mouth daily. 10/18/21   Domenic Polite, MD  ?bisacodyl (DULCOLAX) 10 MG suppository Place 10 mg rectally as needed for moderate constipation.    [provider]  ?carbidopa-levodopa (SINEMET IR) 25-100 MG tablet Take 1.5 tablets by mouth at bedtime. 12/16/20   Narda Amber K, DO  ?cholecalciferol (VITAMIN D3) 25 MCG (1000 UNIT) tablet Take 1,000 Units by mouth daily.    [provider]  ?furosemide (LASIX) 40 MG tablet Take 1 tablet (40 mg total) by mouth daily. 10/18/21   Domenic Polite, MD  ?hydrALAZINE (APRESOLINE) 25 MG tablet TAKE 1 TABLET BY MOUTH THREE TIMES DAILY 11/22/20   [name], [name], MD  ?insulin aspart (NOVOLOG) 100 UNIT/ML injection Inject 5 Units into the skin. TWICE DAILY FOR CBG >200mg /dL    [provider]  ?isosorbide mononitrate (IMDUR) 30 MG 24 hr tablet Take 1 tablet by mouth once daily 07/10/21   [name], [name], MD  ?LANTUS SOLOSTAR 100 UNIT/ML Solostar Pen Inject 20 Units into the skin daily. 10/18/21   Domenic Polite, MD  ?levothyroxine (SYNTHROID, LEVOTHROID) 100 MCG tablet Take 100 mcg by mouth daily before breakfast.    [provider]  ?Magnesium Hydroxide (MILK OF MAGNESIA PO) Take 30 mLs by mouth as needed.    [provider]  ?meclizine (ANTIVERT) 25 MG tablet Take 25 mg by mouth daily.    [provider]  ?metoprolol succinate (TOPROL-XL) 25 MG 24 hr tablet Take 1 tablet by mouth once daily 06/07/21   [name], [name], MD  ?nitroGLYCERIN (NITROSTAT) 0.4 MG SL tablet DISSOLVE ONE TABLET UNDER THE TONGUE EVERY 5 MINUTES AS  NEEDED FOR CHEST PAIN. 04/19/21   [name], [name], MD  ?NON FORMULARY Diet:Reg CCD HH/thin diet consistency    [provider]  ?nystatin (MYCOSTATIN/NYSTOP) powder APPLY TOPICALLY TO BUTTOCKS AND GROIN FOLLOWED BY A THIN LAYER OF BARRIER CREAM TWICE A DAY    [provider]  ?ondansetron (ZOFRAN) 4 MG tablet Take 4 mg by mouth every 6 (six) hours as needed for nausea. 08/20/20   [provider]  ?oxyCODONE (ROXICODONE) 5 MG immediate release tablet Take 1 tablet (5 mg total) by mouth every 8 (eight) hours as needed for severe pain. 01/28/22   Medina-Vargas, Senaida Lange, NP  ?Oyster Shell Calcium 500 MG TABS TAKE 1 TABLET BY MOUTH ONCE DAILY FOR SUPPLEMENT    [provider]  ?pregabalin (LYRICA) 50 MG capsule  Take 1 capsule (50 mg total) by mouth 2 (two) times daily. 02/13/22   Medina-Vargas, Senaida Lange, NP  ?Sodium Phosphates (RA SALINE ENEMA RE) Place 1 Dose rectally as needed.    [provider]  ?vitamin B-12 (CYANOCOBALAMIN) 1000 MCG tablet Take 1,000 mcg by mouth daily.    [provider]  ?   ? ?Allergies    ?Duloxetine hcl, Statins, Amitriptyline, Benadryl [diphenhydramine hcl (sleep)], Ezetimibe, Gabapentin, Metformin hcl, Other, Sitagliptin, Tylenol [acetaminophen], and Colesevelam hcl   ? ?Review of Systems   ?Review of Systems ?See above ?Physical Exam ?Updated Vital Signs ?BP (!) 160/99   Pulse 77   Temp 98.4 ?F (36.9 ?C) (Oral)   Resp 16   SpO2 97%  ?Physical Exam ?Vitals and nursing note reviewed.  ?Constitutional:   ?   General: She is not in acute distress. ?   Appearance: She is well-developed. She is not diaphoretic.  ?HENT:  ?   Head: Normocephalic and atraumatic.  ?Eyes:  ?   Conjunctiva/sclera: Conjunctivae normal.  ?Cardiovascular:  ?   Rate and Rhythm: Normal rate and regular rhythm.  ?   Heart sounds: Normal heart sounds. No murmur heard. ?  No friction rub. No gallop.  ?Pulmonary:  ?   Effort: Pulmonary effort is normal. No respiratory  distress.  ?   Breath sounds: Rales present. No wheezing.  ?Abdominal:  ?   General: There is no distension.  ?   Palpations: Abdomen is soft.  ?   Tenderness: There is no abdominal tenderness. There is no guarding.  ?M

## 2022-02-13 NOTE — ED Notes (Addendum)
Attempted to call [facility] with no answer x2.  ?

## 2022-02-13 NOTE — Discharge Instructions (Addendum)
Take the potassium. After you take 80mg  of lasix tomorrow as previously prescribed, continue to take 60mg  for 3 days prior to going to your usual dosing and following up with your physician.  ?

## 2022-02-13 NOTE — ED Notes (Signed)
Dinner tray ordered.

## 2022-02-14 DIAGNOSIS — Z7401 Bed confinement status: Secondary | ICD-10-CM | POA: Diagnosis not present

## 2022-02-14 DIAGNOSIS — R404 Transient alteration of awareness: Secondary | ICD-10-CM | POA: Diagnosis not present

## 2022-02-15 ENCOUNTER — Encounter: Payer: Self-pay | Admitting: Adult Health

## 2022-02-15 ENCOUNTER — Non-Acute Institutional Stay (SKILLED_NURSING_FACILITY): Payer: Medicare Other | Admitting: Adult Health

## 2022-02-15 DIAGNOSIS — I480 Paroxysmal atrial fibrillation: Secondary | ICD-10-CM | POA: Diagnosis not present

## 2022-02-15 DIAGNOSIS — I5032 Chronic diastolic (congestive) heart failure: Secondary | ICD-10-CM

## 2022-02-15 DIAGNOSIS — I25709 Atherosclerosis of coronary artery bypass graft(s), unspecified, with unspecified angina pectoris: Secondary | ICD-10-CM

## 2022-02-15 NOTE — Progress Notes (Signed)
Location:  [facility] Room Number: 319-A Place of Service:  SNF (31) Provider:  Kenard Gower, DNP, FNP-BC  Patient Care Team: [name], MD as PCP - General (Family Medicine) [name], Deloris Ping, MD as PCP - Cardiology (Cardiology) Glendale Chard, DO as Consulting Physician (Neurology)  Extended Emergency Contact Information Primary Emergency Contact: Kai Levins Address: [address]          [place], [place] [address] Darden [place] of [place] Home Phone: [phone] Relation: Spouse Secondary Emergency Contact: DENNIS,SHERRIE Mobile Phone: [phone] Relation: Daughter  Code Status:  Full Code  Goals of care: Advanced Directive information Advanced Directives 02/15/2022  Does Patient Have a Medical Advance Directive? No  Type of Advance Directive -  Would patient like information on creating a medical advance directive? No - Patient declined     Chief Complaint  Patient presents with   Acute Visit    ED follow-up    HPI:  Pt is a 88 y.o. female who was re-admitted to [facility] on 02/14/22.  She has a PMH of CAD S/p chronic diastolic heart failure, paroxysmal atrial fibrillation on Eliquis, diabetes mellitus, hypertension and hypothyroidism.  She is a long-term care resident of [facility] who was transferred to ED on 02/13/22 due to shortness of breath and "twinge of chest pain".9 chest x-ray showed pleural effusion.  EKG showed no changes in comparison to prior studies.  BNP was 115.  Troponin 35 and 28, was 33 and 31 previously.  She was diagnosed with CHF.  She was given dose of IV Lasix and increased Lasix dose for 3 days.  She was seen in her room today.  She denies shortness of breath nor chest pains.   Past Medical History:  Diagnosis Date   Arthritis    Coronary artery disease 1999   CABG   Depression    Diabetes mellitus with vascular disease    type II; metformin and Amaryl   Diabetic  neuropathy (HCC)    History of anemia as a child    HOH (hard of hearing)    Hypertension    Hypothyroidism    synthroid   PONV (postoperative nausea and vomiting)    Restless leg syndrome    S/P cardiac cath 08/13/18 stable CAD 08/14/2018   Shortness of breath 09/03/2012   ECHO- The LA is Moderately Dilated, mild mitral annular calcification. mild pulmonary hypertension, moderate concentric LV hypertrophy. Atrial septum is aneurysmal. Aortic valve appears to be mildly sclerotic.EF 88%   Urinary incontinence    Weakness of both legs    Past Surgical History:  Procedure Laterality Date   ABDOMINAL HYSTERECTOMY     APPENDECTOMY     BACK SURGERY     CARDIAC CATHERIZATION  08/20/03   Widely patent bypass grafts. Normal left ventricular systolic function.   CARDIAC CATHETERIZATION     CHOLECYSTECTOMY     COLONOSCOPY     CORONARY ARTERY BYPASS GRAFT     EYE SURGERY     bilateral cataracts   LEFT HEART CATH AND CORONARY ANGIOGRAPHY N/A 08/13/2018   Procedure: LEFT HEART CATH AND CORONARY ANGIOGRAPHY;  Surgeon: Kathleene Hazel, MD;  Location: [facility] INVASIVE CV LAB;  Service: Cardiovascular;  Laterality: N/A;   LUMBAR LAMINECTOMY/DECOMPRESSION MICRODISCECTOMY N/A 07/26/2015   Procedure: BILATERAL Lumbar 3-4 SUBTOTAL HEMILAMINECTOMY WITH LATERAL RECESS DECOMPRESSION;  Surgeon: Kerrin Champagne, MD;  Location: [facility] OR;  Service: Orthopedics;  Laterality: N/A;   SHOULDER SURGERY  TUBAL LIGATION      Allergies  Allergen Reactions   Duloxetine Hcl Nausea And Vomiting and Other (See Comments)   Statins Other (See Comments)    Causes myalgias Other reaction(s): Unknown   Amitriptyline Other (See Comments)    Pt. States that it caused it to be suicidal.    Benadryl [Diphenhydramine Hcl (Sleep)] Other (See Comments)    Makes the patient very "hyper"   Ezetimibe     Other reaction(s): Unknown Other reaction(s): Unknown   Gabapentin Other (See Comments)   Metformin Hcl     Other  reaction(s): Unknown Other reaction(s): Unknown   Other     Other reaction(s): Unknown   Sitagliptin     Other reaction(s): Unknown Other reaction(s): Unknown   Tylenol [Acetaminophen] Nausea Only   Colesevelam Hcl Other (See Comments)    Causes dizziness Other reaction(s): Unknown    Outpatient Encounter Medications as of 02/15/2022  Medication Sig   albuterol (PROVENTIL) (2.5 MG/3ML) 0.083% nebulizer solution Take by nebulization.   amLODipine (NORVASC) 10 MG tablet Take 10 mg by mouth daily.   apixaban (ELIQUIS) 5 MG TABS tablet Take 1 tablet (5 mg total) by mouth 2 (two) times daily.   Ascorbic Acid (VITAMIN C) 1000 MG tablet Take 1,000 mg by mouth daily.   benazepril (LOTENSIN) 10 MG tablet Take 1 tablet (10 mg total) by mouth daily.   bisacodyl (DULCOLAX) 10 MG suppository Place 10 mg rectally as needed for moderate constipation.   carbidopa-levodopa (SINEMET IR) 25-100 MG tablet Take 1.5 tablets by mouth at bedtime.   cholecalciferol (VITAMIN D3) 25 MCG (1000 UNIT) tablet Take 1,000 Units by mouth daily.   furosemide (LASIX) 20 MG tablet Take 3 tablets (60 mg total) by mouth daily for 3 days.   furosemide (LASIX) 40 MG tablet Take 1 tablet (40 mg total) by mouth daily.   hydrALAZINE (APRESOLINE) 25 MG tablet TAKE 1 TABLET BY MOUTH THREE TIMES DAILY   insulin aspart (NOVOLOG) 100 UNIT/ML injection Inject 5 Units into the skin. TWICE DAILY FOR CBG >200mg /dL   isosorbide mononitrate (IMDUR) 30 MG 24 hr tablet Take 1 tablet by mouth once daily   LANTUS SOLOSTAR 100 UNIT/ML Solostar Pen Inject 20 Units into the skin daily.   levothyroxine (SYNTHROID, LEVOTHROID) 100 MCG tablet Take 100 mcg by mouth daily before breakfast.   Magnesium Hydroxide (MILK OF MAGNESIA PO) Take 30 mLs by mouth as needed.   meclizine (ANTIVERT) 25 MG tablet Take 25 mg by mouth daily.   metoprolol succinate (TOPROL-XL) 25 MG 24 hr tablet Take 1 tablet by mouth once daily   nitroGLYCERIN (NITROSTAT) 0.4 MG SL  tablet DISSOLVE ONE TABLET UNDER THE TONGUE EVERY 5 MINUTES AS NEEDED FOR CHEST PAIN.   NON FORMULARY Diet:Reg [facility]/thin diet consistency   nystatin (MYCOSTATIN/NYSTOP) powder APPLY TOPICALLY TO BUTTOCKS AND GROIN FOLLOWED BY A THIN LAYER OF BARRIER CREAM TWICE A DAY   ondansetron (ZOFRAN) 4 MG tablet Take 4 mg by mouth every 6 (six) hours as needed for nausea.   oxyCODONE (ROXICODONE) 5 MG immediate release tablet Take 1 tablet (5 mg total) by mouth every 8 (eight) hours as needed for severe pain.   Oyster Shell Calcium 500 MG TABS TAKE 1 TABLET BY MOUTH ONCE DAILY FOR SUPPLEMENT   potassium chloride SA (KLOR-CON M) 20 MEQ tablet Take 1 tablet (20 mEq total) by mouth 2 (two) times daily for 3 days.   pregabalin (LYRICA) 50 MG capsule Take 1 capsule (50  mg total) by mouth 2 (two) times daily.   Sodium Phosphates (RA SALINE ENEMA RE) Place 1 Dose rectally as needed.   vitamin B-12 (CYANOCOBALAMIN) 1000 MCG tablet Take 1,000 mcg by mouth daily.   No facility-administered encounter medications on file as of 02/15/2022.    Review of Systems  Constitutional:  Negative for appetite change, chills, fatigue and fever.  HENT:  Negative for congestion, hearing loss, rhinorrhea and sore throat.   Eyes: Negative.   Respiratory:  Negative for cough, shortness of breath and wheezing.   Cardiovascular:  Positive for leg swelling. Negative for chest pain and palpitations.  Gastrointestinal:  Negative for abdominal pain, constipation, diarrhea, nausea and vomiting.  Genitourinary:  Negative for dysuria.  Musculoskeletal:  Negative for arthralgias, back pain and myalgias.  Skin:  Negative for color change, rash and wound.  Neurological:  Negative for dizziness, weakness and headaches.  Psychiatric/Behavioral:  Negative for behavioral problems. The patient is not nervous/anxious.       Immunization History  Administered Date(s) Administered   Influenza Split 09/13/2007, 09/02/2009, 09/02/2010,  09/03/2011, 08/19/2012, 09/22/2013, 08/29/2016, 08/14/2018   Influenza, High Dose Seasonal PF 08/14/2018, 09/30/2019   Influenza, Quadrivalent, Recombinant, Inj, Pf 08/27/2017, 08/30/2021   Influenza,inj,Quad PF,6+ Mos 11/09/2020   Influenza,inj,quad, With Preservative 08/05/2014, 10/18/2015   Janssen (J&J) SARS-COV-2 Vaccination 04/13/2020   Pfizer Covid-19 Vaccine Bivalent Booster 34yrs & up 01/04/2021   Pneumococcal Conjugate-13 09/08/2008, 01/11/2015   Td 07/04/2007   Pertinent  Health Maintenance Due  Topic Date Due   OPHTHALMOLOGY EXAM  Never done   FOOT EXAM  01/25/2022   HEMOGLOBIN A1C  07/19/2022   INFLUENZA VACCINE  Completed   DEXA SCAN  Completed   Fall Risk 10/22/2021 10/22/2021 10/24/2021 02/13/2022 02/13/2022  Falls in the past year? - - - - -  Was there an injury with Fall? - - - - -  Fall Risk Category Calculator - - - - -  Fall Risk Category - - - - -  Patient Fall Risk Level High fall risk High fall risk High fall risk Low fall risk Low fall risk  Patient at Risk for Falls Due to - - - - -  Fall risk Follow up - - - - -     Vitals:   02/15/22 1103  BP: 139/66  Pulse: (!) 59  Temp: (!) 97.3 F (36.3 C)  Weight: 211 lb 6.4 oz (95.9 kg)  Height: 5\' 6"  (1.676 m)   Body mass index is 34.12 kg/m.  Physical Exam Constitutional:      General: She is not in acute distress.    Appearance: Normal appearance. She is obese.  HENT:     Head: Normocephalic and atraumatic.     Nose: Nose normal.     Mouth/Throat:     Mouth: Mucous membranes are moist.  Eyes:     Conjunctiva/sclera: Conjunctivae normal.  Cardiovascular:     Rate and Rhythm: Normal rate and regular rhythm.  Pulmonary:     Effort: Pulmonary effort is normal.     Breath sounds: Normal breath sounds.  Abdominal:     General: Bowel sounds are normal.     Palpations: Abdomen is soft.  Musculoskeletal:        General: Swelling present. Normal range of motion.     Cervical back: Normal range of  motion.     Right lower leg: Edema present.     Left lower leg: Edema present.     Comments: Right foot  2+edema. Left foot 1+edema.  Skin:    General: Skin is warm and dry.  Neurological:     General: No focal deficit present.     Mental Status: She is alert and oriented to person, place, and time. Mental status is at baseline.  Psychiatric:        Mood and Affect: Mood normal.        Behavior: Behavior normal.        Thought Content: Thought content normal.        Judgment: Judgment normal.       Labs reviewed: Recent Labs    10/13/21 0324 10/14/21 0339 10/20/21 0216 10/21/21 0156 10/22/21 0309 10/23/21 0551 10/24/21 1045 10/24/21 1111 01/22/22 0000 01/29/22 0000 02/13/22 0947  NA 133*   < > 131* 127*   < > 132* 136   < > 139 136* 136  K 4.4   < > 4.3 4.8   < > 4.9 4.7   < > 3.8 3.4* 3.1*  CL 106   < > 99 98   < > 103 107   < > 98* 96* 97*  CO2 20*   < > 25 20*   < > 23 19*   < > 28* 29* 30  GLUCOSE 173*   < > 128* 193*   < > 175* 190*  --   --   --  318*  BUN 23   < > 35* 53*   < > 50* 35*   < > 14 11 9   CREATININE 1.14*   < > 1.45* 2.02*   < > 1.51* 1.16*   < > 0.8 0.7 0.94  CALCIUM 8.1*   < > 8.3* 8.0*   < > 7.9* 8.3*   < > 8.7 8.6* 8.9  MG 1.6*  --  1.8 1.9  --   --   --   --   --   --   --    < > = values in this interval not displayed.   Recent Labs    10/11/21 1815 10/24/21 1045 01/09/22 0000 02/13/22 0947  AST 14* 20 7* 15  ALT 8 20  --  11  ALKPHOS 81 89  --  72  BILITOT 0.4 0.6  --  1.1  PROT 5.8* 4.9*  --  5.9*  ALBUMIN 3.4* 2.2* 3.4* 3.2*   Recent Labs    10/23/21 0551 10/24/21 1045 10/24/21 1111 12/29/21 0000 01/09/22 0000 01/22/22 0000 01/29/22 0000 01/30/22 0000 02/13/22 0947  WBC 11.4* 7.8   < > 10.2 8.2   < > 8.2 8.0 8.1  NEUTROABS 8.2* 5.4   < > 7.30 5.90  --   --   --  5.9  HGB 10.7* 14.0   < > 12.8 13.4   < > 13.6 12.5 14.0  HCT 32.3* 43.3   < >  --  41   < > 41 39 43.2  MCV 95.3 96.4  --   --   --   --   --   --  88.9  PLT  263 226   < > 190 163   < > 186 180 233   < > = values in this interval not displayed.   Lab Results  Component Value Date   TSH 5.98 (A) 01/19/2022   Lab Results  Component Value Date   HGBA1C 8.5 01/19/2022   Lab Results  Component Value Date   CHOL 149 08/12/2018   HDL 34 (  L) 08/12/2018   LDLCALC 81 08/12/2018   TRIG 171 (H) 08/12/2018   CHOLHDL 4.4 08/12/2018    Significant Diagnostic Results in last 30 days:  DG Chest Portable 1 View  Result Date: 02/13/2022 CLINICAL DATA:  Shortness of breath EXAM: PORTABLE CHEST 1 VIEW COMPARISON:  Chest x-ray dated October 24, 2021 FINDINGS: Cardiac and mediastinal contours are unchanged post median sternotomy and CABG. Small left greater than right pleural effusions and atelectasis, new compared to prior exam. No evidence of pneumothorax. IMPRESSION: Small left greater than right pleural effusions and atelectasis. Electronically Signed   By: Allegra Lai M.D.   On: 02/13/2022 09:33    Assessment/Plan  1. Chronic diastolic CHF (congestive heart failure) (HCC) -  no SOB noted, continue Lasix at 60 mg x 3 days then 40 mg daily -  continue KCL ER 20 meq 1 tab twice a day X 3 days  2. Coronary artery disease involving coronary bypass graft of native heart with angina pectoris (HCC) Denies chest pain, continue isosorbide MN ER 30 mg 1 tab daily, NTG PRN  3. Paroxysmal atrial fibrillation (HCC) -  rate-controlled, continue Eliquis 5 mg 1 tab twice a day for anticoagulation and metoprolol succinate ER 25 mg 1 tab daily for rate control    Family/ staff Communication: Discussed plan of care with resident and charge nurse  Labs/tests ordered:   None    Kenard Gower, DNP, MSN, FNP-BC [facility] [phone] (Monday-Friday 8:00 a.m. - 5:00 p.m.) [phone] (after hours)

## 2022-02-21 ENCOUNTER — Encounter: Payer: Self-pay | Admitting: Podiatry

## 2022-02-21 ENCOUNTER — Other Ambulatory Visit: Payer: Self-pay

## 2022-02-21 ENCOUNTER — Ambulatory Visit (INDEPENDENT_AMBULATORY_CARE_PROVIDER_SITE_OTHER): Payer: Medicare Other | Admitting: Podiatry

## 2022-02-21 DIAGNOSIS — E119 Type 2 diabetes mellitus without complications: Secondary | ICD-10-CM | POA: Diagnosis not present

## 2022-02-21 DIAGNOSIS — L84 Corns and callosities: Secondary | ICD-10-CM | POA: Diagnosis not present

## 2022-02-21 DIAGNOSIS — M2011 Hallux valgus (acquired), right foot: Secondary | ICD-10-CM | POA: Diagnosis not present

## 2022-02-21 DIAGNOSIS — M2042 Other hammer toe(s) (acquired), left foot: Secondary | ICD-10-CM | POA: Diagnosis not present

## 2022-02-21 DIAGNOSIS — B351 Tinea unguium: Secondary | ICD-10-CM | POA: Diagnosis not present

## 2022-02-21 DIAGNOSIS — E0842 Diabetes mellitus due to underlying condition with diabetic polyneuropathy: Secondary | ICD-10-CM

## 2022-02-21 DIAGNOSIS — M2041 Other hammer toe(s) (acquired), right foot: Secondary | ICD-10-CM | POA: Diagnosis not present

## 2022-02-21 DIAGNOSIS — M2012 Hallux valgus (acquired), left foot: Secondary | ICD-10-CM | POA: Diagnosis not present

## 2022-02-21 DIAGNOSIS — M79675 Pain in left toe(s): Secondary | ICD-10-CM | POA: Diagnosis not present

## 2022-02-21 DIAGNOSIS — M79674 Pain in right toe(s): Secondary | ICD-10-CM

## 2022-02-27 DIAGNOSIS — I5032 Chronic diastolic (congestive) heart failure: Secondary | ICD-10-CM | POA: Diagnosis not present

## 2022-02-28 DIAGNOSIS — E1165 Type 2 diabetes mellitus with hyperglycemia: Secondary | ICD-10-CM | POA: Diagnosis not present

## 2022-02-28 DIAGNOSIS — I5032 Chronic diastolic (congestive) heart failure: Secondary | ICD-10-CM | POA: Diagnosis not present

## 2022-02-28 DIAGNOSIS — E039 Hypothyroidism, unspecified: Secondary | ICD-10-CM | POA: Diagnosis not present

## 2022-02-28 DIAGNOSIS — R2681 Unsteadiness on feet: Secondary | ICD-10-CM | POA: Diagnosis not present

## 2022-02-28 DIAGNOSIS — M6281 Muscle weakness (generalized): Secondary | ICD-10-CM | POA: Diagnosis not present

## 2022-02-28 DIAGNOSIS — G5791 Unspecified mononeuropathy of right lower limb: Secondary | ICD-10-CM | POA: Diagnosis not present

## 2022-02-28 DIAGNOSIS — E1140 Type 2 diabetes mellitus with diabetic neuropathy, unspecified: Secondary | ICD-10-CM | POA: Diagnosis not present

## 2022-02-28 DIAGNOSIS — I10 Essential (primary) hypertension: Secondary | ICD-10-CM | POA: Diagnosis not present

## 2022-02-28 DIAGNOSIS — S82842D Displaced bimalleolar fracture of left lower leg, subsequent encounter for closed fracture with routine healing: Secondary | ICD-10-CM | POA: Diagnosis not present

## 2022-02-28 DIAGNOSIS — E11319 Type 2 diabetes mellitus with unspecified diabetic retinopathy without macular edema: Secondary | ICD-10-CM | POA: Diagnosis not present

## 2022-03-01 NOTE — Progress Notes (Signed)
?Subjective:  ?Patient ID: Sierra Wright, female    DOB: March 25, 1934,  MRN: [id number] ? ?Sierra Wright presents to clinic today for at risk foot care with history of diabetic neuropathy and painful elongated mycotic toenails 1-5 bilaterally which are tender when wearing enclosed shoe gear. Pain is relieved with periodic professional debridement. ? ?Patient is a resident of [facility] and [place]. She presents alone for appointment on today. ? ?Patient doesn't remember blood glucose reading today. Not aware of last A1c. ? ?New problem(s): None.  ? ?PCP is [name], MD , and last visit was October 11, 2021. ? ?Allergies  ?Allergen Reactions  ? Duloxetine Hcl Nausea And Vomiting and Other (See Comments)  ? Statins Other (See Comments)  ?  Causes myalgias ?Other reaction(s): Unknown  ? Amitriptyline Other (See Comments)  ?  Pt. States that it caused it to be suicidal.   ? Benadryl [Diphenhydramine Hcl (Sleep)] Other (See Comments)  ?  Makes the patient very "hyper"  ? Ezetimibe   ?  Other reaction(s): Unknown ?Other reaction(s): Unknown  ? Gabapentin Other (See Comments)  ? Metformin Hcl   ?  Other reaction(s): Unknown ?Other reaction(s): Unknown  ? Other   ?  Other reaction(s): Unknown  ? Sitagliptin   ?  Other reaction(s): Unknown ?Other reaction(s): Unknown  ? Tylenol [Acetaminophen] Nausea Only  ? Colesevelam Hcl Other (See Comments)  ?  Causes dizziness ?Other reaction(s): Unknown  ? ? ?Review of Systems: Negative except as noted in the HPI ? ?Objective:  ?There were no vitals filed for this visit. ?Constitutional Patient is a pleasant 88 y.o. Caucasian female obese in NAD. AAO x 3.  ?Vascular Capillary fill time to digits <3 seconds b/l lower extremities. Faintly palpable DP pulse(s) b/l lower extremities. Faintly palpable PT pulse(s) b/l lower extremities. Pedal hair absent. Lower extremity skin temperature gradient within normal limits. No pain with calf compression b/l. No edema noted  b/l lower extremities. No cyanosis or clubbing noted.  ?Neurologic Normal speech. Pt has subjective symptoms of neuropathy. Protective sensation decreased with 10 gram monofilament b/l.  ?Dermatologic Pedal skin with normal turgor, texture and tone bilaterally. No open wounds bilaterally. No interdigital macerations bilaterally. Toenails 1-5 b/l elongated, discolored, dystrophic, thickened, crumbly with subungual debris and tenderness to dorsal palpation.   ?Orthopedic: Normal muscle strength 5/5 to all lower extremity muscle groups bilaterally. No pain crepitus or joint limitation noted with ROM b/l. Hallux valgus with bunion deformity noted b/l lower extremities. Patient presents in wheelchair.  ?  ?Assessment:  ? ?1. Pain due to onychomycosis of toenails of both feet   ?2. Callus   ?3. Hallux valgus, acquired, bilateral   ?4. Acquired hammertoes of both feet   ?5. Diabetic polyneuropathy associated with diabetes mellitus due to underlying condition ([place])   ?6. Encounter for diabetic foot exam ([place])   ? ?Plan:  ?-Examined patient. ?-Diabetic foot examination performed today. ?-Continue foot and shoe inspections daily. Monitor blood glucose per PCP/Endocrinologist's recommendations. ?-Callus(es) L hallux pared utilizing sterile scalpel blade without complication or incident. Total number debrided =1.  ?-Toenails 1-5 bilaterally were debrided in length and girth with sterile nail nippers and dremel. Pinpoint bleeding of right great toe addressed with Lumicain Hemostatic Solution, cleansed with alcohol. triple antibiotic ointment applied. Order written for facility to apply triple antibiotic ointment once daily for 7 days. ?-Continue fall/pressure precautions daily. ?-Patient/POA to call should there be question/concern in the interim.   ? ?Return in about 3  months (around 05/24/2022). ? ?Marzetta Board, DPM ?

## 2022-03-02 DIAGNOSIS — M6281 Muscle weakness (generalized): Secondary | ICD-10-CM | POA: Diagnosis not present

## 2022-03-02 DIAGNOSIS — S82842D Displaced bimalleolar fracture of left lower leg, subsequent encounter for closed fracture with routine healing: Secondary | ICD-10-CM | POA: Diagnosis not present

## 2022-03-02 DIAGNOSIS — R2681 Unsteadiness on feet: Secondary | ICD-10-CM | POA: Diagnosis not present

## 2022-03-02 DIAGNOSIS — I5032 Chronic diastolic (congestive) heart failure: Secondary | ICD-10-CM | POA: Diagnosis not present

## 2022-03-03 DIAGNOSIS — I5032 Chronic diastolic (congestive) heart failure: Secondary | ICD-10-CM | POA: Diagnosis not present

## 2022-03-03 DIAGNOSIS — M6281 Muscle weakness (generalized): Secondary | ICD-10-CM | POA: Diagnosis not present

## 2022-03-03 DIAGNOSIS — R2681 Unsteadiness on feet: Secondary | ICD-10-CM | POA: Diagnosis not present

## 2022-03-05 DIAGNOSIS — I5022 Chronic systolic (congestive) heart failure: Secondary | ICD-10-CM | POA: Diagnosis not present

## 2022-03-07 DIAGNOSIS — I5032 Chronic diastolic (congestive) heart failure: Secondary | ICD-10-CM | POA: Diagnosis not present

## 2022-03-07 DIAGNOSIS — R2681 Unsteadiness on feet: Secondary | ICD-10-CM | POA: Diagnosis not present

## 2022-03-07 DIAGNOSIS — M6281 Muscle weakness (generalized): Secondary | ICD-10-CM | POA: Diagnosis not present

## 2022-03-08 ENCOUNTER — Other Ambulatory Visit: Payer: Self-pay | Admitting: Adult Health

## 2022-03-08 DIAGNOSIS — S82899D Other fracture of unspecified lower leg, subsequent encounter for closed fracture with routine healing: Secondary | ICD-10-CM

## 2022-03-08 MED ORDER — OXYCODONE HCL 5 MG PO TABS: 5.0000 mg | ORAL_TABLET | Freq: Three times a day (TID) | ORAL | 0 refills | Status: DC | PRN

## 2022-03-09 DIAGNOSIS — M6281 Muscle weakness (generalized): Secondary | ICD-10-CM | POA: Diagnosis not present

## 2022-03-09 DIAGNOSIS — R2681 Unsteadiness on feet: Secondary | ICD-10-CM | POA: Diagnosis not present

## 2022-03-09 DIAGNOSIS — I5032 Chronic diastolic (congestive) heart failure: Secondary | ICD-10-CM | POA: Diagnosis not present

## 2022-03-12 DIAGNOSIS — I5032 Chronic diastolic (congestive) heart failure: Secondary | ICD-10-CM | POA: Diagnosis not present

## 2022-03-12 DIAGNOSIS — M6281 Muscle weakness (generalized): Secondary | ICD-10-CM | POA: Diagnosis not present

## 2022-03-12 DIAGNOSIS — R2681 Unsteadiness on feet: Secondary | ICD-10-CM | POA: Diagnosis not present

## 2022-03-14 ENCOUNTER — Non-Acute Institutional Stay (SKILLED_NURSING_FACILITY): Payer: Medicare Other | Admitting: Adult Health

## 2022-03-14 ENCOUNTER — Encounter: Payer: Self-pay | Admitting: Adult Health

## 2022-03-14 DIAGNOSIS — Z794 Long term (current) use of insulin: Secondary | ICD-10-CM | POA: Diagnosis not present

## 2022-03-14 DIAGNOSIS — E1140 Type 2 diabetes mellitus with diabetic neuropathy, unspecified: Secondary | ICD-10-CM

## 2022-03-14 DIAGNOSIS — M6281 Muscle weakness (generalized): Secondary | ICD-10-CM | POA: Diagnosis not present

## 2022-03-14 DIAGNOSIS — I10 Essential (primary) hypertension: Secondary | ICD-10-CM

## 2022-03-14 DIAGNOSIS — I5032 Chronic diastolic (congestive) heart failure: Secondary | ICD-10-CM

## 2022-03-14 DIAGNOSIS — R2681 Unsteadiness on feet: Secondary | ICD-10-CM | POA: Diagnosis not present

## 2022-03-14 DIAGNOSIS — E039 Hypothyroidism, unspecified: Secondary | ICD-10-CM

## 2022-03-14 NOTE — Progress Notes (Signed)
? ?Location:  [facility] ?Nursing Home Room Number: 319-A ?Place of Service:  SNF (31) ?Provider:  Kenard GowerMedina-Vargas, Tamy Accardo, DNP, FNP-BC ? ?Patient Care Team: ?Mila PalmerWolters, Sharon, MD as PCP - General (Family Medicine) ?[name], Deloris PingPhilip J, MD as PCP - Cardiology (Cardiology) ?Glendale ChardPatel, Donika K, DO as Consulting Physician (Neurology) ? ?Extended Emergency Contact Information ?Primary Emergency Contact: Claud KelpKendrick,Robert D ?Address: [address] ?         [place] [address] [place] ?Home Phone: [phone] ?Relation: Spouse ?Secondary Emergency Contact: DENNIS,SHERRIE ?Mobile Phone: [phone] ?Relation: Daughter ? ?Code Status:  Full Code  ? ?Goals of care: Advanced Directive information ? ?  03/14/2022  ? 11:25 AM  ?Advanced Directives  ?Does Patient Have a Medical Advance Directive? No  ?Would patient like information on creating a medical advance directive? No - Patient declined  ? ? ? ?Chief Complaint  ?Patient presents with  ? Medical Management of Chronic Issues  ?  Routine Visit  ? ? ?HPI:  ?Pt is a 88 y.o. female seen today for medical management of chronic diseases. She is a short-term care resident of [facility]. She has a PMH of CAD, chronic diastolic heart failure, paroxysmal atrial fibrillation on Eliquis, diabetes mellitus, hypertension and hypothyroidism. ? ?Essential hypertension  -  SBPs ranging from 121 to 149, takes hydralazine 25 mg 1 tab 3 times a day, metoprolol succinate ER 25 mg 1 tab daily, benazepril 10 mg 1 tab daily and amlodipine 10 mg 1 tab daily ? ?Acquired hypothyroidism  -   takes levothyroxine 100 mcg 1 tab daily ? ?Type 2 diabetes mellitus with diabetic neuropathy, with long-term current use of insulin (HCC)  -  CBGs ranging from 107-304, with outlier 372.  She takes insulin glargine 24 units subcutaneously daily ? ?Chronic diastolic CHF (congestive heart failure) (HCC) -  denies SOB, takes furosemide 40 mg 1 tab daily ? ? ?Past Medical  History:  ?Diagnosis Date  ? Arthritis   ? Coronary artery disease 1999  ? CABG  ? Depression   ? Diabetes mellitus with vascular disease   ? type II; metformin and Amaryl  ? Diabetic neuropathy (HCC)   ? History of anemia as a child   ? HOH (hard of hearing)   ? Hypertension   ? Hypothyroidism   ? synthroid  ? PONV (postoperative nausea and vomiting)   ? Restless leg syndrome   ? S/P cardiac cath 08/13/18 stable CAD 08/14/2018  ? Shortness of breath 09/03/2012  ? ECHO- The LA is Moderately Dilated, mild mitral annular calcification. mild pulmonary hypertension, moderate concentric LV hypertrophy. Atrial septum is aneurysmal. Aortic valve appears to be mildly sclerotic.EF 88%  ? Urinary incontinence   ? Weakness of both legs   ? ?Past Surgical History:  ?Procedure Laterality Date  ? ABDOMINAL HYSTERECTOMY    ? APPENDECTOMY    ? BACK SURGERY    ? CARDIAC CATHERIZATION  08/20/03  ? Widely patent bypass grafts. Normal left ventricular systolic function.  ? CARDIAC CATHETERIZATION    ? CHOLECYSTECTOMY    ? COLONOSCOPY    ? CORONARY ARTERY BYPASS GRAFT    ? EYE SURGERY    ? bilateral cataracts  ? LEFT HEART CATH AND CORONARY ANGIOGRAPHY N/A 08/13/2018  ? Procedure: LEFT HEART CATH AND CORONARY ANGIOGRAPHY;  Surgeon: Kathleene HazelMcAlhany, Christopher D, MD;  Location: [facility] INVASIVE [facility];  Service: Cardiovascular;  Laterality: N/A;  ? LUMBAR LAMINECTOMY/DECOMPRESSION MICRODISCECTOMY N/A 07/26/2015  ? Procedure: BILATERAL Lumbar 3-4  SUBTOTAL HEMILAMINECTOMY WITH LATERAL RECESS DECOMPRESSION;  Surgeon: Kerrin Champagne, MD;  Location: [facility] OR;  Service: Orthopedics;  Laterality: N/A;  ? SHOULDER SURGERY    ? TUBAL LIGATION    ? ? ?Allergies  ?Allergen Reactions  ? Duloxetine Hcl Nausea And Vomiting and Other (See Comments)  ? Statins Other (See Comments)  ?  Causes myalgias ?Other reaction(s): Unknown  ? Amitriptyline Other (See Comments)  ?  Pt. States that it caused it to be suicidal.   ? Benadryl [Diphenhydramine Hcl (Sleep)] Other (See  Comments)  ?  Makes the patient very "hyper"  ? Ezetimibe   ?  Other reaction(s): Unknown ?Other reaction(s): Unknown  ? Gabapentin Other (See Comments)  ? Metformin Hcl   ?  Other reaction(s): Unknown ?Other reaction(s): Unknown  ? Other   ?  Other reaction(s): Unknown  ? Sitagliptin   ?  Other reaction(s): Unknown ?Other reaction(s): Unknown  ? Tylenol [Acetaminophen] Nausea Only  ? Colesevelam Hcl Other (See Comments)  ?  Causes dizziness ?Other reaction(s): Unknown  ? ? ?Outpatient Encounter Medications as of 03/14/2022  ?Medication Sig  ? albuterol (PROVENTIL) (2.5 MG/3ML) 0.083% nebulizer solution Take 2.5 mg by nebulization every 6 (six) hours as needed.  ? amLODipine (NORVASC) 10 MG tablet Take 10 mg by mouth daily.  ? apixaban (ELIQUIS) 5 MG TABS tablet Take 1 tablet (5 mg total) by mouth 2 (two) times daily.  ? Ascorbic Acid (VITAMIN C) 1000 MG tablet Take 1,000 mg by mouth daily.  ? benazepril (LOTENSIN) 10 MG tablet Take 1 tablet (10 mg total) by mouth daily.  ? bisacodyl (DULCOLAX) 10 MG suppository Place 10 mg rectally as needed for moderate constipation.  ? carbidopa-levodopa (SINEMET IR) 25-100 MG tablet Take 1.5 tablets by mouth at bedtime.  ? cholecalciferol (VITAMIN D3) 25 MCG (1000 UNIT) tablet Take 1,000 Units by mouth daily.  ? furosemide (LASIX) 40 MG tablet Take 1 tablet (40 mg total) by mouth daily.  ? hydrALAZINE (APRESOLINE) 25 MG tablet TAKE 1 TABLET BY MOUTH THREE TIMES DAILY  ? insulin aspart (NOVOLOG) 100 UNIT/ML injection Inject into the skin as directed. 100-150=4UNITS 151-200=6UNITS 201-250=8UNITS GREATER THAN 250=10UNITS  ? insulin glargine (LANTUS SOLOSTAR) 100 UNIT/ML Solostar Pen Inject 24 Units into the skin daily.  ? isosorbide mononitrate (IMDUR) 30 MG 24 hr tablet Take 1 tablet by mouth once daily  ? levothyroxine (SYNTHROID, LEVOTHROID) 100 MCG tablet Take 100 mcg by mouth daily before breakfast.  ? Magnesium Hydroxide (MILK OF MAGNESIA PO) Take 30 mLs by mouth as needed.  ?  meclizine (ANTIVERT) 25 MG tablet Take 25 mg by mouth daily.  ? metoprolol succinate (TOPROL-XL) 25 MG 24 hr tablet Take 1 tablet by mouth once daily  ? nitroGLYCERIN (NITROSTAT) 0.4 MG SL tablet DISSOLVE ONE TABLET UNDER THE TONGUE EVERY 5 MINUTES AS NEEDED FOR CHEST PAIN.  ? NON FORMULARY Diet:Reg [facility] HH/thin diet consistency  ? nystatin (MYCOSTATIN/NYSTOP) powder APPLY TOPICALLY TO BUTTOCKS AND GROIN FOLLOWED BY A THIN LAYER OF BARRIER CREAM TWICE A DAY  ? ondansetron (ZOFRAN) 4 MG tablet Take 4 mg by mouth every 6 (six) hours as needed for nausea.  ? Oyster Shell Calcium 500 MG TABS TAKE 1 TABLET BY MOUTH ONCE DAILY FOR SUPPLEMENT  ? Sodium Phosphates (RA SALINE ENEMA RE) Place 1 Dose rectally as needed.  ? vitamin B-12 (CYANOCOBALAMIN) 1000 MCG tablet Take 1,000 mcg by mouth daily.  ? [DISCONTINUED] LANTUS SOLOSTAR 100 UNIT/ML Solostar Pen Inject 20 Units  into the skin daily. (Patient taking differently: Inject 24 Units into the skin daily.)  ? [DISCONTINUED] oxyCODONE (ROXICODONE) 5 MG immediate release tablet Take 1 tablet (5 mg total) by mouth every 8 (eight) hours as needed for severe pain.  ? [DISCONTINUED] pregabalin (LYRICA) 50 MG capsule Take 1 capsule (50 mg total) by mouth 2 (two) times daily.  ? [DISCONTINUED] furosemide (LASIX) 20 MG tablet Take 3 tablets (60 mg total) by mouth daily for 3 days.  ? [DISCONTINUED] potassium chloride SA (KLOR-CON M) 20 MEQ tablet Take 1 tablet (20 mEq total) by mouth 2 (two) times daily for 3 days.  ? ?No facility-administered encounter medications on file as of 03/14/2022.  ? ? ?Review of Systems  ?Constitutional:  Negative for appetite change, chills, fatigue and fever.  ?HENT:  Negative for congestion, hearing loss, rhinorrhea and sore throat.   ?Eyes: Negative.   ?Respiratory:  Negative for cough, shortness of breath and wheezing.   ?Cardiovascular:  Positive for leg swelling. Negative for chest pain and palpitations.  ?Gastrointestinal:  Negative for abdominal  pain, constipation, diarrhea, nausea and vomiting.  ?Genitourinary:  Negative for dysuria.  ?Musculoskeletal:  Negative for arthralgias, back pain and myalgias.  ?Skin:  Negative for color change, rash and wo

## 2022-03-15 ENCOUNTER — Other Ambulatory Visit: Payer: Self-pay | Admitting: Adult Health

## 2022-03-15 DIAGNOSIS — M6281 Muscle weakness (generalized): Secondary | ICD-10-CM | POA: Diagnosis not present

## 2022-03-15 DIAGNOSIS — I5032 Chronic diastolic (congestive) heart failure: Secondary | ICD-10-CM | POA: Diagnosis not present

## 2022-03-15 DIAGNOSIS — R2681 Unsteadiness on feet: Secondary | ICD-10-CM | POA: Diagnosis not present

## 2022-03-15 DIAGNOSIS — S82899D Other fracture of unspecified lower leg, subsequent encounter for closed fracture with routine healing: Secondary | ICD-10-CM

## 2022-03-15 MED ORDER — OXYCODONE HCL 5 MG PO TABS: 5.0000 mg | ORAL_TABLET | Freq: Three times a day (TID) | ORAL | 0 refills | Status: DC | PRN

## 2022-03-17 ENCOUNTER — Other Ambulatory Visit: Payer: Self-pay | Admitting: Adult Health

## 2022-03-17 MED ORDER — PREGABALIN 50 MG PO CAPS: 50.0000 mg | ORAL_CAPSULE | Freq: Two times a day (BID) | ORAL | 0 refills | Status: DC

## 2022-03-18 ENCOUNTER — Other Ambulatory Visit: Payer: Self-pay | Admitting: Adult Health

## 2022-03-18 MED ORDER — PREGABALIN 50 MG PO CAPS: 50.0000 mg | ORAL_CAPSULE | Freq: Two times a day (BID) | ORAL | 0 refills | Status: DC

## 2022-03-19 DIAGNOSIS — M6281 Muscle weakness (generalized): Secondary | ICD-10-CM | POA: Diagnosis not present

## 2022-03-19 DIAGNOSIS — I5032 Chronic diastolic (congestive) heart failure: Secondary | ICD-10-CM | POA: Diagnosis not present

## 2022-03-19 DIAGNOSIS — R2681 Unsteadiness on feet: Secondary | ICD-10-CM | POA: Diagnosis not present

## 2022-04-08 NOTE — Progress Notes (Signed)
? ? ?Office Visit  ?  ?Patient Name: Sierra Wright ?Date of Encounter: 04/09/2022 ? ?Primary Care Provider:  [name], MD ?Primary Cardiologist:  Mertie Moores, MD ?Primary Electrophysiologist: None ? ?Patient Profile  ?  ?Chief Complaint: 1 year follow-up for CHF ? ? Notable History: ?CAD s/p CABG 1999, left heart cath 9/19 with2/3 grafts patent (S-OM occluded), OM fills from L-L collaterals -no targets for PCI, medical therapy ?HFpEF: EF 60-65% by echo 08/2018 (NYHA III) ?HTN ?HLD (statin intolerant) ?DM ?Hypothyroidism ?Paroxysmal atrial fibrillation ? ? Recent Studies: ?2D echo 08/2018:Mild LVH, EF 60-65, normal wall motion, mild MAC ?LHC 08/2018: LAD ostial 99, proximal 100-CTO; D1 ostial 99 LCx proximal 100-CTO; OM2 ostial 99, RCA ostial 99, distal 100-CTO, SVG-RCA patent, SVG-OM2 occluded, LIMA-LAD patent ? ?History of Present Illness  ?  ?Sierra Wright is a 88 y.o. female with PMH of CAD s/p CABG 1999x3, LHC 2019 with 20% CTO of SVG-RCA, SVG-OM 2 occluded, LIMA-LAD patent, HTN, DM, hypothyroidism, HFrEF path (EF 60-65%). Patient was last seen 03/2021 by Dr. Acie Fredrickson for annual follow-up.  During visit patient denied any anginal symptoms, however she did endorse some shortness of breath on occasion.  She endorses intermittent ankle edema that responds well to Lasix.  She also has occasional dizziness but no syncope noted.  She is sedentary due to osteoarthritis of knees and uses a wheelchair. Beta-blocker was decreased last visit due to bradycardia and hydralazine was increased due to increased systolic blood pressure. ? ?She was admitted 10/2021 due to bilateral ankle fracture suffered after fall.  Felt to be nonsurgical candidate due to osteoporosis and peripheral vascular disease.  Patient developed new onset A-fib and was started on Eliquis.  She was seen in ED on 01/2022 for CHF exacerbation.  Her BNP was 150 and she was given potassium and Lasix p.o. and discharged. ? ?Since last being seen in the  office patient reports she is doing well and did have a recent exacerbation that required ED visit for IV Lasix.  Since then patient reports that her fluid level has been well managed and she is tolerating her daily Lasix. She  has been maintaining sinus rhythm with metoprolol and is asymptomatic when she is in atrial fibrillation.  She is still in [facility] due to bilateral fractures in her ankles.  She is hoping to get better so that she can get back home. Patient denies chest pain, palpitations, dyspnea, PND, orthopnea, nausea, vomiting, dizziness, syncope, edema, weight gain, or early satiety. ? ?Past Medical History  ?  ?Past Medical History:  ?Diagnosis Date  ? Arthritis   ? Coronary artery disease 1999  ? CABG  ? Depression   ? Diabetes mellitus with vascular disease   ? type II; metformin and Amaryl  ? Diabetic neuropathy ([facility] [place])   ? History of anemia as a child   ? HOH (hard of hearing)   ? Hypertension   ? Hypothyroidism   ? synthroid  ? PONV (postoperative nausea and vomiting)   ? Restless leg syndrome   ? S/P cardiac cath 08/13/18 stable CAD 08/14/2018  ? Shortness of breath 09/03/2012  ? ECHO- The LA is Moderately Dilated, mild mitral annular calcification. mild pulmonary hypertension, moderate concentric LV hypertrophy. Atrial septum is aneurysmal. Aortic valve appears to be mildly sclerotic.EF 88%  ? Urinary incontinence   ? Weakness of both legs   ? ?Past Surgical History:  ?Procedure Laterality Date  ? ABDOMINAL HYSTERECTOMY    ? APPENDECTOMY    ?  BACK SURGERY    ? CARDIAC CATHERIZATION  08/20/03  ? Widely patent bypass grafts. Normal left ventricular systolic function.  ? CARDIAC CATHETERIZATION    ? CHOLECYSTECTOMY    ? COLONOSCOPY    ? CORONARY ARTERY BYPASS GRAFT    ? EYE SURGERY    ? bilateral cataracts  ? LEFT HEART CATH AND CORONARY ANGIOGRAPHY N/A 08/13/2018  ? Procedure: LEFT HEART CATH AND CORONARY ANGIOGRAPHY;  Surgeon: Burnell Blanks, MD;  Location: [facility];   Service: Cardiovascular;  Laterality: N/A;  ? LUMBAR LAMINECTOMY/DECOMPRESSION MICRODISCECTOMY N/A 07/26/2015  ? Procedure: BILATERAL Lumbar 3-4 SUBTOTAL HEMILAMINECTOMY WITH LATERAL RECESS DECOMPRESSION;  Surgeon: Jessy Oto, MD;  Location: [facility];  Service: Orthopedics;  Laterality: N/A;  ? SHOULDER SURGERY    ? TUBAL LIGATION    ? ? ?Allergies ? ?Allergies  ?Allergen Reactions  ? Duloxetine Hcl Nausea And Vomiting and Other (See Comments)  ? Statins Other (See Comments)  ?  Causes myalgias ?Other reaction(s): Unknown  ? Amitriptyline Other (See Comments)  ?  Pt. States that it caused it to be suicidal.   ? Benadryl [Diphenhydramine Hcl (Sleep)] Other (See Comments)  ?  Makes the patient very "hyper"  ? Ezetimibe   ?  Other reaction(s): Unknown ?Other reaction(s): Unknown  ? Gabapentin Other (See Comments)  ? Metformin Hcl   ?  Other reaction(s): Unknown ?Other reaction(s): Unknown  ? Other   ?  Other reaction(s): Unknown  ? Sitagliptin   ?  Other reaction(s): Unknown ?Other reaction(s): Unknown  ? Tylenol [Acetaminophen] Nausea Only  ? Colesevelam Hcl Other (See Comments)  ?  Causes dizziness ?Other reaction(s): Unknown  ? ? ?Home Medications  ?  ?Current Outpatient Medications  ?Medication Sig Dispense Refill  ? albuterol (PROVENTIL) (2.5 MG/3ML) 0.083% nebulizer solution Take 2.5 mg by nebulization every 6 (six) hours as needed.    ? amLODipine (NORVASC) 10 MG tablet Take 10 mg by mouth daily.    ? apixaban (ELIQUIS) 5 MG TABS tablet Take 1 tablet (5 mg total) by mouth 2 (two) times daily.    ? Ascorbic Acid (VITAMIN C) 1000 MG tablet Take 1,000 mg by mouth daily.    ? benazepril (LOTENSIN) 10 MG tablet Take 1 tablet (10 mg total) by mouth daily.    ? bisacodyl (DULCOLAX) 10 MG suppository Place 10 mg rectally as needed for moderate constipation.    ? carbidopa-levodopa (SINEMET IR) 25-100 MG tablet Take 1.5 tablets by mouth at bedtime. 135 tablet 3  ? cholecalciferol (VITAMIN D3) 25 MCG (1000 UNIT) tablet  Take 1,000 Units by mouth daily.    ? furosemide (LASIX) 40 MG tablet Take 1 tablet (40 mg total) by mouth daily.    ? hydrALAZINE (APRESOLINE) 25 MG tablet TAKE 1 TABLET BY MOUTH THREE TIMES DAILY 270 tablet 2  ? insulin aspart (NOVOLOG) 100 UNIT/ML injection Inject into the skin as directed. 100-150=4UNITS 151-200=6UNITS 201-250=8UNITS GREATER THAN 250=10UNITS    ? insulin glargine (LANTUS SOLOSTAR) 100 UNIT/ML Solostar Pen Inject 24 Units into the skin daily.    ? isosorbide mononitrate (IMDUR) 30 MG 24 hr tablet Take 1 tablet by mouth once daily 90 tablet 2  ? levothyroxine (SYNTHROID, LEVOTHROID) 100 MCG tablet Take 100 mcg by mouth daily before breakfast.    ? Magnesium Hydroxide (MILK OF MAGNESIA PO) Take 30 mLs by mouth as needed.    ? meclizine (ANTIVERT) 25 MG tablet Take 25 mg by mouth daily.    ?  metoprolol succinate (TOPROL-XL) 25 MG 24 hr tablet Take 1 tablet by mouth once daily 90 tablet 3  ? nitroGLYCERIN (NITROSTAT) 0.4 MG SL tablet DISSOLVE ONE TABLET UNDER THE TONGUE EVERY 5 MINUTES AS NEEDED FOR CHEST PAIN. 25 tablet 5  ? NON FORMULARY Diet:Reg [facility] HH/thin diet consistency    ? nystatin (MYCOSTATIN/NYSTOP) powder APPLY TOPICALLY TO BUTTOCKS AND GROIN FOLLOWED BY A THIN LAYER OF BARRIER CREAM TWICE A DAY    ? ondansetron (ZOFRAN) 4 MG tablet Take 4 mg by mouth every 6 (six) hours as needed for nausea.    ? oxyCODONE (ROXICODONE) 5 MG immediate release tablet Take 1 tablet (5 mg total) by mouth every 8 (eight) hours as needed for severe pain. 60 tablet 0  ? Oyster Shell Calcium 500 MG TABS TAKE 1 TABLET BY MOUTH ONCE DAILY FOR SUPPLEMENT    ? pregabalin (LYRICA) 50 MG capsule Take 1 capsule (50 mg total) by mouth 2 (two) times daily. 60 capsule 0  ? Sodium Phosphates (RA SALINE ENEMA RE) Place 1 Dose rectally as needed.    ? vitamin B-12 (CYANOCOBALAMIN) 1000 MCG tablet Take 1,000 mcg by mouth daily.    ? ?No current facility-administered medications for this visit.  ?  ? ?Review of Systems   ?Please see the history of present illness.    ? ?All other systems reviewed and are otherwise negative except as noted above. ? ?Physical Exam  ?  ?Wt Readings from Last 3 Encounters:  ?04/09/22 219 lb (99.3

## 2022-04-09 ENCOUNTER — Encounter: Payer: Self-pay | Admitting: Nurse Practitioner

## 2022-04-09 ENCOUNTER — Ambulatory Visit (INDEPENDENT_AMBULATORY_CARE_PROVIDER_SITE_OTHER): Payer: Medicare Other | Admitting: Nurse Practitioner

## 2022-04-09 VITALS — BP 108/40 | HR 60 | Ht 66.0 in | Wt 219.0 lb

## 2022-04-09 DIAGNOSIS — I25810 Atherosclerosis of coronary artery bypass graft(s) without angina pectoris: Secondary | ICD-10-CM

## 2022-04-09 DIAGNOSIS — I480 Paroxysmal atrial fibrillation: Secondary | ICD-10-CM

## 2022-04-09 DIAGNOSIS — I5032 Chronic diastolic (congestive) heart failure: Secondary | ICD-10-CM

## 2022-04-09 DIAGNOSIS — I10 Essential (primary) hypertension: Secondary | ICD-10-CM | POA: Diagnosis not present

## 2022-04-09 DIAGNOSIS — E782 Mixed hyperlipidemia: Secondary | ICD-10-CM | POA: Diagnosis not present

## 2022-04-09 NOTE — Patient Instructions (Signed)
Medication Instructions:  ? ?*If you need a refill on your cardiac medications before your next appointment, please call your pharmacy* ? ? ?Lab Work: ?None ordered ? ?If you have labs (blood work) drawn today and your tests are completely normal, you will receive your results only by: ?MyChart Message (if you have MyChart) OR ?A paper copy in the mail ?If you have any lab test that is abnormal or we need to change your treatment, we will call you to review the results. ? ? ?Testing/Procedures: ?None ordered ? ? ?Follow-Up: ?At [facility], you and your health needs are our priority.  As part of our continuing mission to provide you with exceptional heart care, we have created designated Provider Care Teams.  These Care Teams include your primary Cardiologist (physician) and Advanced Practice Providers (APPs -  Physician Assistants and Nurse Practitioners) who all work together to provide you with the care you need, when you need it. ? ?We recommend signing up for the patient portal called "MyChart".  Sign up information is provided on this After Visit Summary.  MyChart is used to connect with patients for Virtual Visits (Telemedicine).  Patients are able to view lab/test results, encounter notes, upcoming appointments, etc.  Non-urgent messages can be sent to your provider as well.   ?To learn more about what you can do with MyChart, go to [email or web address].   ? ?Your next appointment:   ?12 month(s) ? ?The format for your next appointment:   ?In Person ? ?Provider:   ?Kristeen Miss, MD  ? ? ?Other Instructions ? ? ?Important Information About Sugar ? ? ? ? ?  ?

## 2022-04-10 ENCOUNTER — Other Ambulatory Visit: Payer: Self-pay | Admitting: Adult Health

## 2022-04-10 DIAGNOSIS — S82899D Other fracture of unspecified lower leg, subsequent encounter for closed fracture with routine healing: Secondary | ICD-10-CM

## 2022-04-10 MED ORDER — OXYCODONE HCL 5 MG PO TABS: 5.0000 mg | ORAL_TABLET | Freq: Three times a day (TID) | ORAL | 0 refills | Status: DC | PRN

## 2022-04-13 ENCOUNTER — Non-Acute Institutional Stay (SKILLED_NURSING_FACILITY): Payer: Medicare Other | Admitting: Internal Medicine

## 2022-04-13 ENCOUNTER — Encounter: Payer: Self-pay | Admitting: Internal Medicine

## 2022-04-13 DIAGNOSIS — E1151 Type 2 diabetes mellitus with diabetic peripheral angiopathy without gangrene: Secondary | ICD-10-CM | POA: Diagnosis not present

## 2022-04-13 DIAGNOSIS — I5032 Chronic diastolic (congestive) heart failure: Secondary | ICD-10-CM | POA: Diagnosis not present

## 2022-04-13 DIAGNOSIS — L989 Disorder of the skin and subcutaneous tissue, unspecified: Secondary | ICD-10-CM | POA: Diagnosis not present

## 2022-04-13 DIAGNOSIS — E039 Hypothyroidism, unspecified: Secondary | ICD-10-CM

## 2022-04-13 NOTE — Progress Notes (Signed)
? ?NURSING HOME LOCATION:  [facility] ?ROOM NUMBER:  319 ? ?CODE STATUS:  Full Code ? ?PCP:  Alessandra Grout MD ? ?This is a nursing facility follow up visit of chronic medical diagnoses & to document compliance with Regulation 483.30 (c) in [employer] [place] Phase [employer] which mandates caregiver visit ( visits can alternate among physician, PA or NP as per statutes) within 10 days of 30 days / 60 days/ 90 days post admission to SNF date   ? ?Interim medical record and care since last SNF visit was updated with review of diagnostic studies and change in clinical status since last visit were documented. ? ?HPI: The patient has been a resident of this facility since 10/23/2021 following a 12-day hospitalization for bimalleolar acute displaced fracture of the left ankle and nondisplaced distal fibular fracture sustained in a mechanical fall.  Manual reduction in the ED was unsuccessful.  Orthopedist Dr. Sharol Given felt that her PVD and severe osteoporosis were contraindication to surgical intervention.  He recommended nonsurgical management. ?Course was complicated by atrial flutter/fib for which Eliquis was initiated prophylactically.  Stage IV AKI was present but this improved with final GFR 34 indicating CKD stage IIIb at discharge. ?She was seen 11/22 in the ED with dyspnea & occasional wheezing. O2 sat was 96%.Possible interstitial edema was present without frank heart failure. She was treated with bronchodilator & discharged back to SNF. ?CKD in the context of poorly controlled diabetes has improved ; current GFR 55.31 indicating stage IIIa.  Most recent A1c on record was 8.5% on 01/19/2022.  The patient has been on sliding scale insulin of 4-10 units before meals.  Pharmacy consultant recommended increasing the basal Lantus dose which is presently 24 units & stopping SSI.   ?Glucose records were reviewed.  The lowest glucose was 118 fasting.  There are a few glucoses over 300 with a  high of 359.  All are reported with lunch and evening meal. ?TSH on 2/17 was slightly elevated at 5.98. ? ?Review of systems: She states that she is doing "okay".  Her main concern is a lesion over the dorsum of the right hand that has been present for several weeks.  There was no associated injury. ?She states that she takes a "slew of medicines in the morning" and subsequently will have numbness and tingling in the right hand and right foot. ?She states that sometimes she is receiving the sliding scale insulin after meals as opposed to prior to the meals.  She does not know any of her glucoses.  She has no related diabetic symptoms. ? ?Constitutional: No fever, significant weight change, fatigue  ?Eyes: No redness, discharge, pain, vision change ?ENT/mouth: No nasal congestion,  purulent discharge, earache, change in hearing, sore throat  ?Cardiovascular: No chest pain, palpitations, paroxysmal nocturnal dyspnea, claudication, edema  ?Respiratory: No cough, sputum production, hemoptysis, DOE, significant snoring, apnea   ?Gastrointestinal: No heartburn, dysphagia, abdominal pain, nausea /vomiting, rectal bleeding, melena, change in bowels ?Genitourinary: No dysuria, hematuria, pyuria, incontinence, nocturia ?Musculoskeletal: No joint stiffness, joint swelling, weakness, pain ?Dermatologic: No rash, pruritus ?Neurologic: No dizziness, headache, syncope, seizures ?Psychiatric: No significant anxiety, depression, insomnia, anorexia ?Endocrine: No change in hair/skin/nails, excessive thirst, excessive hunger, excessive urination  ?Hematologic/lymphatic: No significant bruising, lymphadenopathy, abnormal bleeding ?Allergy/immunology: No itchy/watery eyes, significant sneezing, urticaria, angioedema ? ?Physical exam:  ?Pertinent or positive findings: She appears her stated age.  Eyebrows are decreased laterally.  The right pupil is ectopic and distorted.  The left pupil is small.  The left corner of the mouth tends to  sag.  She has complete dentures.  Heart sounds are distant; second heart sound is slightly split.  Abdomen is protuberant.  She has 1/2+ edema at the right sock line and trace on the left.  Pedal pulses are decreased but the posterior tibial pulses stronger than the dorsalis pedis pulses.  She has mixed PIP and DIP arthritic changes with decreased flexion of the fingers of the left hand. Interosseous wasting is present.  She has irregular splotchy hyperpigmentation over the shins.  There is a 0.85 x 0.85 round cystic-like papule on the dorsum of the hand.  This is tender to touch ,minimally fluctuant and has minimal bland erythema around it.  It is not attached to the subcutaneous structures. ? ?General appearance: Adequately nourished; no acute distress, increased work of breathing is present.   ?Lymphatic: No lymphadenopathy about the head, neck, axilla. ?Eyes: No conjunctival inflammation or lid edema is present. There is no scleral icterus. ?Ears:  External ear exam shows no significant lesions or deformities.   ?Nose:  External nasal examination shows no deformity or inflammation. Nasal mucosa are pink and moist without lesions, exudates ?Neck:  No thyromegaly, masses, tenderness noted.    ?Heart:  No gallop, murmur, click, rub .  ?Lungs: Chest clear to auscultation without wheezes, rhonchi, rales, rubs. ?Abdomen: Bowel sounds are normal. Abdomen is soft and nontender with no organomegaly, hernias, masses. ?GU: Deferred  ?Extremities:  No cyanosis, clubbing  ?Skin: Warm & dry w/o tenting. ?No significant rash. ? ?See summary under each active problem in the Problem List with associated updated therapeutic plan ? ? ?

## 2022-04-13 NOTE — Assessment & Plan Note (Signed)
CHF clinically compensated; no NVD or peripheral edema. No change in present cardiac regimen indicated.  

## 2022-04-13 NOTE — Assessment & Plan Note (Signed)
01/19/2022 A1c 8.5% indicating poorly controlled diabetes.  A1c will be updated.  Basal insulin will be increased to 28 units and sliding scale changed to 5 units before lunch and the evening meal. ? ?

## 2022-04-13 NOTE — Assessment & Plan Note (Signed)
01/19/2022 TSH 5.98.  TSH will be updated. ?

## 2022-04-13 NOTE — Assessment & Plan Note (Addendum)
Keflex orally x5 days along with topical mupirocin.  If persistentor progressive refer to Dermatology. ?

## 2022-04-13 NOTE — Patient Instructions (Signed)
See assessment and plan under each diagnosis in the problem list and acutely for this visit 

## 2022-04-16 DIAGNOSIS — I5032 Chronic diastolic (congestive) heart failure: Secondary | ICD-10-CM | POA: Diagnosis not present

## 2022-04-16 DIAGNOSIS — E119 Type 2 diabetes mellitus without complications: Secondary | ICD-10-CM | POA: Diagnosis not present

## 2022-04-16 DIAGNOSIS — M6259 Muscle wasting and atrophy, not elsewhere classified, multiple sites: Secondary | ICD-10-CM | POA: Diagnosis not present

## 2022-04-16 LAB — COMPREHENSIVE METABOLIC PANEL
Calcium: 9.1 (ref 8.7–10.7)
eGFR: 55

## 2022-04-16 LAB — BASIC METABOLIC PANEL
BUN: 25 — AB (ref 4–21)
CO2: 25 — AB (ref 13–22)
Chloride: 101 (ref 99–108)
Creatinine: 1 (ref 0.5–1.1)
Glucose: 204
Potassium: 4.1 mEq/L (ref 3.5–5.1)
Sodium: 140 (ref 137–147)

## 2022-04-16 LAB — TSH: TSH: 7.58 — AB (ref 0.41–5.90)

## 2022-04-16 LAB — HEMOGLOBIN A1C: Hemoglobin A1C: 8

## 2022-04-17 ENCOUNTER — Encounter: Payer: Self-pay | Admitting: Adult Health

## 2022-04-17 ENCOUNTER — Other Ambulatory Visit: Payer: Self-pay | Admitting: Adult Health

## 2022-04-17 ENCOUNTER — Non-Acute Institutional Stay (SKILLED_NURSING_FACILITY): Payer: Medicare Other | Admitting: Adult Health

## 2022-04-17 DIAGNOSIS — E039 Hypothyroidism, unspecified: Secondary | ICD-10-CM

## 2022-04-17 DIAGNOSIS — E1151 Type 2 diabetes mellitus with diabetic peripheral angiopathy without gangrene: Secondary | ICD-10-CM | POA: Diagnosis not present

## 2022-04-17 MED ORDER — PREGABALIN 50 MG PO CAPS: 50.0000 mg | ORAL_CAPSULE | Freq: Two times a day (BID) | ORAL | 0 refills | Status: AC

## 2022-04-17 NOTE — Progress Notes (Signed)
Location:  [facility] Room Number: NH319/A Place of Service:  SNF (31) Provider:  Kenard GowerMedina-Vargas, Sully Manzi, DNP, FNP-BC  Patient Care Team: Mila PalmerWolters, Sharon, MD as PCP - General (Family Medicine) [name], Deloris PingPhilip J, MD as PCP - Cardiology (Cardiology) Glendale ChardPatel, Donika K, DO as Consulting Physician (Neurology)  Extended Emergency Contact Information Primary Emergency Contact: Kai LevinsKendrick,Robert D Address: [address]          [place], [place] [address] [place] Home Phone: [phone] Relation: Spouse Secondary Emergency Contact: DENNIS,SHERRIE Mobile Phone: [phone] Relation: Daughter  Code Status:  FULL  Goals of care: Advanced Directive information    04/17/2022   11:45 AM  Advanced Directives  Does Patient Have a Medical Advance Directive? No  Would patient like information on creating a medical advance directive? No - Patient declined     Chief Complaint  Patient presents with   Acute Visit    Patient is being seen for elevated TSH    HPI:  Pt is a 88 y.o. female seen today for an acute visit regarding elevated TSH she takes levothyroxine 100 mcg 1 tab daily for hypothyroidism. On 01/17/22, tsh was 5.98. CBGs ranging from 154-255, 303.  She takes insulin glargine 28 units subcutaneously every morning and insulin aspart 5 units before lunch and dinner, for diabetes mellitus.  Latest A1c 8.0, down from 8.5.  Insulin aspart not covered by insurance and will need to be changed.  Past Medical History:  Diagnosis Date   Arthritis    Coronary artery disease 1999   CABG   Depression    Diabetes mellitus with vascular disease    type II; metformin and Amaryl   Diabetic neuropathy (HCC)    History of anemia as a child    HOH (hard of hearing)    Hypertension    Hypothyroidism    synthroid   PONV (postoperative nausea and vomiting)    Restless leg syndrome    S/P cardiac cath 08/13/18 stable CAD 08/14/2018   Shortness of breath 09/03/2012    ECHO- The LA is Moderately Dilated, mild mitral annular calcification. mild pulmonary hypertension, moderate concentric LV hypertrophy. Atrial septum is aneurysmal. Aortic valve appears to be mildly sclerotic.EF 88%   Urinary incontinence    Weakness of both legs    Past Surgical History:  Procedure Laterality Date   ABDOMINAL HYSTERECTOMY     APPENDECTOMY     BACK SURGERY     CARDIAC CATHERIZATION  08/20/03   Widely patent bypass grafts. Normal left ventricular systolic function.   CARDIAC CATHETERIZATION     CHOLECYSTECTOMY     COLONOSCOPY     CORONARY ARTERY BYPASS GRAFT     EYE SURGERY     bilateral cataracts   LEFT HEART CATH AND CORONARY ANGIOGRAPHY N/A 08/13/2018   Procedure: LEFT HEART CATH AND CORONARY ANGIOGRAPHY;  Surgeon: Kathleene HazelMcAlhany, Christopher D, MD;  Location: [facility] INVASIVE CV [facility];  Service: Cardiovascular;  Laterality: N/A;   LUMBAR LAMINECTOMY/DECOMPRESSION MICRODISCECTOMY N/A 07/26/2015   Procedure: BILATERAL Lumbar 3-4 SUBTOTAL HEMILAMINECTOMY WITH LATERAL RECESS DECOMPRESSION;  Surgeon: Kerrin ChampagneJames E Nitka, MD;  Location: [facility] OR;  Service: Orthopedics;  Laterality: N/A;   SHOULDER SURGERY     TUBAL LIGATION      Allergies  Allergen Reactions   Duloxetine Hcl Nausea And Vomiting and Other (See Comments)   Statins Other (See Comments)    Causes myalgias Other reaction(s): Unknown   Amitriptyline Other (See Comments)    Pt. States that it  caused it to be suicidal.    Benadryl [Diphenhydramine Hcl (Sleep)] Other (See Comments)    Makes the patient very "hyper"   Ezetimibe     Other reaction(s): Unknown Other reaction(s): Unknown   Gabapentin Other (See Comments)   Metformin Hcl     Other reaction(s): Unknown Other reaction(s): Unknown   Other     Other reaction(s): Unknown   Sitagliptin     Other reaction(s): Unknown Other reaction(s): Unknown   Tylenol [Acetaminophen] Nausea Only   Colesevelam Hcl Other (See Comments)    Causes dizziness Other reaction(s):  Unknown    Outpatient Encounter Medications as of 04/17/2022  Medication Sig   albuterol (PROVENTIL) (2.5 MG/3ML) 0.083% nebulizer solution Take 2.5 mg by nebulization every 6 (six) hours as needed.   amLODipine (NORVASC) 10 MG tablet Take 10 mg by mouth daily.   apixaban (ELIQUIS) 5 MG TABS tablet Take 1 tablet (5 mg total) by mouth 2 (two) times daily.   Ascorbic Acid (VITAMIN C) 1000 MG tablet Take 1,000 mg by mouth daily.   benazepril (LOTENSIN) 10 MG tablet Take 1 tablet (10 mg total) by mouth daily.   bisacodyl (DULCOLAX) 10 MG suppository Place 10 mg rectally as needed for moderate constipation.   carbidopa-levodopa (SINEMET IR) 25-100 MG tablet Take 1.5 tablets by mouth at bedtime.   cholecalciferol (VITAMIN D3) 25 MCG (1000 UNIT) tablet Take 1,000 Units by mouth daily.   furosemide (LASIX) 40 MG tablet Take 1 tablet (40 mg total) by mouth daily.   hydrALAZINE (APRESOLINE) 25 MG tablet TAKE 1 TABLET BY MOUTH THREE TIMES DAILY   insulin aspart (NOVOLOG) 100 UNIT/ML injection Inject into the skin as directed. 100-150=4UNITS 151-200=6UNITS 201-250=8UNITS GREATER THAN 250=10UNITS   insulin glargine (LANTUS SOLOSTAR) 100 UNIT/ML Solostar Pen Inject 24 Units into the skin daily.   isosorbide mononitrate (IMDUR) 30 MG 24 hr tablet Take 1 tablet by mouth once daily   levothyroxine (SYNTHROID, LEVOTHROID) 100 MCG tablet Take 100 mcg by mouth daily before breakfast.   Magnesium Hydroxide (MILK OF MAGNESIA PO) Take 30 mLs by mouth as needed.   meclizine (ANTIVERT) 25 MG tablet Take 25 mg by mouth daily.   metoprolol succinate (TOPROL-XL) 25 MG 24 hr tablet Take 1 tablet by mouth once daily   nitroGLYCERIN (NITROSTAT) 0.4 MG SL tablet DISSOLVE ONE TABLET UNDER THE TONGUE EVERY 5 MINUTES AS NEEDED FOR CHEST PAIN.   NON FORMULARY Diet:Reg [facility] HH/thin diet consistency   nystatin (MYCOSTATIN/NYSTOP) powder APPLY TOPICALLY TO BUTTOCKS AND GROIN FOLLOWED BY A THIN LAYER OF BARRIER CREAM TWICE A DAY    ondansetron (ZOFRAN) 4 MG tablet Take 4 mg by mouth every 6 (six) hours as needed for nausea.   oxyCODONE (ROXICODONE) 5 MG immediate release tablet Take 1 tablet (5 mg total) by mouth every 8 (eight) hours as needed for severe pain.   Oyster Shell Calcium 500 MG TABS TAKE 1 TABLET BY MOUTH ONCE DAILY FOR SUPPLEMENT   pregabalin (LYRICA) 50 MG capsule Take 1 capsule (50 mg total) by mouth 2 (two) times daily.   Sodium Phosphates (RA SALINE ENEMA RE) Place 1 Dose rectally as needed.   vitamin B-12 (CYANOCOBALAMIN) 1000 MCG tablet Take 1,000 mcg by mouth daily.   No facility-administered encounter medications on file as of 04/17/2022.    Review of Systems  Constitutional:  Negative for appetite change, chills, fatigue and fever.  HENT:  Negative for congestion, hearing loss, rhinorrhea and sore throat.   Eyes: Negative.  Respiratory:  Negative for cough, shortness of breath and wheezing.   Cardiovascular:  Negative for chest pain, palpitations and leg swelling.  Gastrointestinal:  Negative for abdominal pain, constipation, diarrhea, nausea and vomiting.  Genitourinary:  Negative for dysuria.  Musculoskeletal:  Negative for arthralgias, back pain and myalgias.  Skin:  Negative for color change, rash and wound.  Neurological:  Negative for dizziness, weakness and headaches.  Psychiatric/Behavioral:  Negative for behavioral problems. The patient is not nervous/anxious.       Immunization History  Administered Date(s) Administered   Influenza Split 09/13/2007, 09/02/2009, 09/02/2010, 09/03/2011, 08/19/2012, 09/22/2013, 08/29/2016, 08/14/2018   Influenza, High Dose Seasonal PF 08/14/2018, 09/30/2019   Influenza, Quadrivalent, Recombinant, Inj, Pf 08/27/2017, 08/30/2021   Influenza,inj,Quad PF,6+ Mos 11/09/2020   Influenza,inj,quad, With Preservative 08/05/2014, 10/18/2015   Janssen (J&J) SARS-COV-2 Vaccination 04/13/2020   Pfizer Covid-19 Vaccine Bivalent Booster 79yrs & up 01/04/2021    Pneumococcal Conjugate-13 09/08/2008, 01/11/2015   Td 07/04/2007   Unspecified SARS-COV-2 Vaccination 03/03/2020   Pertinent  Health Maintenance Due  Topic Date Due   OPHTHALMOLOGY EXAM  Never done   INFLUENZA VACCINE  07/03/2022   HEMOGLOBIN A1C  07/19/2022   FOOT EXAM  02/22/2023   DEXA SCAN  Completed      10/22/2021    8:00 AM 10/22/2021    7:15 PM 10/24/2021   10:08 AM 02/13/2022    8:58 AM 02/13/2022    9:36 PM  Fall Risk  Patient Fall Risk Level High fall risk High fall risk High fall risk Low fall risk Low fall risk     Vitals:   04/17/22 1145  BP: 104/64  Pulse: 62  Resp: 20  Temp: 97.7 F (36.5 C)  Weight: 220 lb 12.8 oz (100.2 kg)  Height: 5\' 6"  (1.676 m)   Body mass index is 35.64 kg/m.  Physical Exam Constitutional:      Appearance: Normal appearance. She is obese.  HENT:     Head: Normocephalic and atraumatic.     Nose: Nose normal.     Mouth/Throat:     Mouth: Mucous membranes are moist.  Eyes:     Conjunctiva/sclera: Conjunctivae normal.  Cardiovascular:     Rate and Rhythm: Normal rate and regular rhythm.  Pulmonary:     Effort: Pulmonary effort is normal.     Breath sounds: Normal breath sounds.  Abdominal:     General: Bowel sounds are normal.     Palpations: Abdomen is soft.  Musculoskeletal:        General: Normal range of motion.     Cervical back: Normal range of motion.  Skin:    General: Skin is warm and dry.  Neurological:     General: No focal deficit present.     Mental Status: She is alert and oriented to person, place, and time.  Psychiatric:        Mood and Affect: Mood normal.        Behavior: Behavior normal.        Thought Content: Thought content normal.        Judgment: Judgment normal.       Labs reviewed: Recent Labs    10/13/21 0324 10/14/21 0339 10/20/21 0216 10/21/21 0156 10/22/21 0309 10/23/21 0551 10/24/21 1045 10/24/21 1111 01/29/22 0000 02/12/22 0000 02/13/22 0947  NA 133*   < > 131* 127*    < > 132* 136   < > 136* 138 136  K 4.4   < > 4.3 4.8   < >  4.9 4.7   < > 3.4* 3.4* 3.1*  CL 106   < > 99 98   < > 103 107   < > 96* 96* 97*  CO2 20*   < > 25 20*   < > 23 19*   < > 29* 30* 30  GLUCOSE 173*   < > 128* 193*   < > 175* 190*  --   --   --  318*  BUN 23   < > 35* 53*   < > 50* 35*   < > 11 13 9   CREATININE 1.14*   < > 1.45* 2.02*   < > 1.51* 1.16*   < > 0.7 0.8 0.94  CALCIUM 8.1*   < > 8.3* 8.0*   < > 7.9* 8.3*   < > 8.6* 8.8 8.9  MG 1.6*  --  1.8 1.9  --   --   --   --   --   --   --    < > = values in this interval not displayed.   Recent Labs    10/11/21 1815 10/24/21 1045 01/09/22 0000 02/13/22 0947  AST 14* 20 7* 15  ALT 8 20  --  11  ALKPHOS 81 89  --  72  BILITOT 0.4 0.6  --  1.1  PROT 5.8* 4.9*  --  5.9*  ALBUMIN 3.4* 2.2* 3.4* 3.2*   Recent Labs    10/23/21 0551 10/24/21 1045 10/24/21 1111 12/29/21 0000 01/09/22 0000 01/22/22 0000 01/29/22 0000 01/30/22 0000 02/13/22 0947  WBC 11.4* 7.8   < > 10.2 8.2   < > 8.2 8.0 8.1  NEUTROABS 8.2* 5.4   < > 7.30 5.90  --   --   --  5.9  HGB 10.7* 14.0   < > 12.8 13.4   < > 13.6 12.5 14.0  HCT 32.3* 43.3   < >  --  41   < > 41 39 43.2  MCV 95.3 96.4  --   --   --   --   --   --  88.9  PLT 263 226   < > 190 163   < > 186 180 233   < > = values in this interval not displayed.   Lab Results  Component Value Date   TSH 5.98 (A) 01/19/2022   Lab Results  Component Value Date   HGBA1C 8.5 01/19/2022   Lab Results  Component Value Date   CHOL 149 08/12/2018   HDL 34 (L) 08/12/2018   LDLCALC 81 08/12/2018   TRIG 171 (H) 08/12/2018   CHOLHDL 4.4 08/12/2018    Significant Diagnostic Results in last 30 days:  No results found.  Assessment/Plan  1. Hypothyroidism, unspecified type Lab Results  Component Value Date   TSH 7.58 (A) 04/16/2022   -  will increase levothyroxine from 100 mcg to 112 mcg daily -Repeat tsh in 6 weeks  2. DM (diabetes mellitus), type 2 with peripheral vascular complications  (HCC) Lab Results  Component Value Date   HGBA1C 8.0 04/16/2022   -  will change insulin aspart to Humalog 5 units subcutaneously before lunch and dinner -  monitor CBGs   Family/ staff Communication: Discussed plan of care with resident and charge nurse.  Labs/tests ordered:   TSH in 6 weeks    04/18/2022, DNP, MSN, FNP-BC [facility] [phone] (Monday-Friday 8:00 a.m. - 5:00 p.m.) [phone] (after hours)

## 2022-04-24 ENCOUNTER — Non-Acute Institutional Stay (SKILLED_NURSING_FACILITY): Payer: Medicare Other | Admitting: Adult Health

## 2022-04-24 ENCOUNTER — Encounter: Payer: Self-pay | Admitting: Adult Health

## 2022-04-24 DIAGNOSIS — I5032 Chronic diastolic (congestive) heart failure: Secondary | ICD-10-CM

## 2022-04-24 DIAGNOSIS — Z7189 Other specified counseling: Secondary | ICD-10-CM

## 2022-04-24 DIAGNOSIS — E1151 Type 2 diabetes mellitus with diabetic peripheral angiopathy without gangrene: Secondary | ICD-10-CM

## 2022-04-24 DIAGNOSIS — E1142 Type 2 diabetes mellitus with diabetic polyneuropathy: Secondary | ICD-10-CM | POA: Diagnosis not present

## 2022-04-24 DIAGNOSIS — I25709 Atherosclerosis of coronary artery bypass graft(s), unspecified, with unspecified angina pectoris: Secondary | ICD-10-CM | POA: Diagnosis not present

## 2022-04-24 DIAGNOSIS — I10 Essential (primary) hypertension: Secondary | ICD-10-CM

## 2022-04-24 NOTE — Progress Notes (Addendum)
Location:  [facility] Room Number: 319A Place of Service:  SNF (31) Provider:  Kenard Gower, DNP, FNP-BC  Patient Care Team: Sierra Palmer, MD as PCP - General (Family Medicine) [name], Deloris Ping, MD as PCP - Cardiology (Cardiology) Glendale Chard, DO as Consulting Physician (Neurology)  Extended Emergency Contact Information Primary Emergency Contact: Kai Levins Address: [address]          [place], [place] [address] D[place] of [place] Home Phone: [phone] Relation: Spouse Secondary Emergency Contact: DENNIS,SHERRIE Mobile Phone: [phone] Relation: Daughter  Code Status:  FULL CODE  Goals of care: Advanced Directive information    04/24/2022   11:57 AM  Advanced Directives  Does Patient Have a Medical Advance Directive? No     Chief Complaint  Patient presents with   Acute Visit    Care plan meeting    HPI:  Pt is a 88 y.o. female who had a care plan meeting today attended by resident, social worker, NP, dietitian and daughter, who attended via telephone conference. Daughter was briefly updated with care plan since she was at work. Resident remains to be full code. Discussed medications, vital signs and weights. Latest weight is 220 lbs, stable. She attends Therapist, music and crafts. She reads books in her room. Discussed medications, vital signs and weights. The meeting lasted for 20 minutes.   Past Medical History:  Diagnosis Date   Arthritis    Coronary artery disease 1999   CABG   Depression    Diabetes mellitus with vascular disease    type II; metformin and Amaryl   Diabetic neuropathy (HCC)    History of anemia as a child    HOH (hard of hearing)    Hypertension    Hypothyroidism    synthroid   PONV (postoperative nausea and vomiting)    Restless leg syndrome    S/P cardiac cath 08/13/18 stable CAD 08/14/2018   Shortness of breath 09/03/2012   ECHO- The LA is Moderately Dilated, mild mitral annular  calcification. mild pulmonary hypertension, moderate concentric LV hypertrophy. Atrial septum is aneurysmal. Aortic valve appears to be mildly sclerotic.EF 88%   Urinary incontinence    Weakness of both legs    Past Surgical History:  Procedure Laterality Date   ABDOMINAL HYSTERECTOMY     APPENDECTOMY     BACK SURGERY     CARDIAC CATHERIZATION  08/20/03   Widely patent bypass grafts. Normal left ventricular systolic function.   CARDIAC CATHETERIZATION     CHOLECYSTECTOMY     COLONOSCOPY     CORONARY ARTERY BYPASS GRAFT     EYE SURGERY     bilateral cataracts   LEFT HEART CATH AND CORONARY ANGIOGRAPHY N/A 08/13/2018   Procedure: LEFT HEART CATH AND CORONARY ANGIOGRAPHY;  Surgeon: Kathleene Hazel, MD;  Location: [facility] INVASIVE CV LAB;  Service: Cardiovascular;  Laterality: N/A;   LUMBAR LAMINECTOMY/DECOMPRESSION MICRODISCECTOMY N/A 07/26/2015   Procedure: BILATERAL Lumbar 3-4 SUBTOTAL HEMILAMINECTOMY WITH LATERAL RECESS DECOMPRESSION;  Surgeon: Kerrin Champagne, MD;  Location: [facility] OR;  Service: Orthopedics;  Laterality: N/A;   SHOULDER SURGERY     TUBAL LIGATION      Allergies  Allergen Reactions   Duloxetine Hcl Nausea And Vomiting and Other (See Comments)   Statins Other (See Comments)    Causes myalgias Other reaction(s): Unknown   Amitriptyline Other (See Comments)    Pt. States that it caused it to be suicidal.    Benadryl [Diphenhydramine Hcl (Sleep)] Other (See  Comments)    Makes the patient very "hyper"   Ezetimibe     Other reaction(s): Unknown Other reaction(s): Unknown   Gabapentin Other (See Comments)   Metformin Hcl     Other reaction(s): Unknown Other reaction(s): Unknown   Other     Other reaction(s): Unknown   Sitagliptin     Other reaction(s): Unknown Other reaction(s): Unknown   Tylenol [Acetaminophen] Nausea Only   Colesevelam Hcl Other (See Comments)    Causes dizziness Other reaction(s): Unknown    Outpatient Encounter Medications as of 04/24/2022   Medication Sig   albuterol (PROVENTIL) (2.5 MG/3ML) 0.083% nebulizer solution Take 2.5 mg by nebulization every 6 (six) hours as needed.   amLODipine (NORVASC) 10 MG tablet Take 10 mg by mouth daily.   apixaban (ELIQUIS) 5 MG TABS tablet Take 1 tablet (5 mg total) by mouth 2 (two) times daily.   Ascorbic Acid (VITAMIN C) 1000 MG tablet Take 1,000 mg by mouth daily.   benazepril (LOTENSIN) 10 MG tablet Take 1 tablet (10 mg total) by mouth daily.   bisacodyl (DULCOLAX) 10 MG suppository Place 10 mg rectally as needed for moderate constipation.   carbidopa-levodopa (SINEMET IR) 25-100 MG tablet Take 1.5 tablets by mouth at bedtime.   cholecalciferol (VITAMIN D3) 25 MCG (1000 UNIT) tablet Take 1,000 Units by mouth daily.   furosemide (LASIX) 40 MG tablet Take 1 tablet (40 mg total) by mouth daily.   hydrALAZINE (APRESOLINE) 25 MG tablet TAKE 1 TABLET BY MOUTH THREE TIMES DAILY   insulin glargine (LANTUS SOLOSTAR) 100 UNIT/ML Solostar Pen Inject 28 Units into the skin daily.   insulin lispro (HUMALOG) 100 UNIT/ML KwikPen Inject into the skin. INJECT 5 UNITS SUBCUTANEOUSLY BEFORE LUNCH AND BEFORE DINNER (PRIME PEN WITH 2 UNITS PRIOR TO EACH USE - PEN   isosorbide mononitrate (IMDUR) 30 MG 24 hr tablet Take 1 tablet by mouth once daily   levothyroxine (SYNTHROID) 112 MCG tablet Take 112 mcg by mouth daily before breakfast.   Magnesium Hydroxide (MILK OF MAGNESIA PO) Take 30 mLs by mouth as needed.   meclizine (ANTIVERT) 25 MG tablet Take 25 mg by mouth daily.   metoprolol succinate (TOPROL-XL) 25 MG 24 hr tablet Take 1 tablet by mouth once daily   mupirocin ointment (BACTROBAN) 2 % 2 (two) times daily. TO LESION ON DORSAL RIGHT HAND   nitroGLYCERIN (NITROSTAT) 0.4 MG SL tablet DISSOLVE ONE TABLET UNDER THE TONGUE EVERY 5 MINUTES AS NEEDED FOR CHEST PAIN.   NON FORMULARY Diet:Reg [facility] HH/thin diet consistency   nystatin (MYCOSTATIN/NYSTOP) powder APPLY TOPICALLY TO BUTTOCKS AND GROIN FOLLOWED BY A THIN  LAYER OF BARRIER CREAM TWICE A DAY   ondansetron (ZOFRAN) 4 MG tablet Take 4 mg by mouth every 6 (six) hours as needed for nausea.   oxyCODONE (ROXICODONE) 5 MG immediate release tablet Take 1 tablet (5 mg total) by mouth every 8 (eight) hours as needed for severe pain.   Oyster Shell Calcium 500 MG TABS TAKE 1 TABLET BY MOUTH ONCE DAILY FOR SUPPLEMENT   pregabalin (LYRICA) 50 MG capsule Take 1 capsule (50 mg total) by mouth 2 (two) times daily.   Sodium Phosphates (RA SALINE ENEMA RE) Place 1 Dose rectally as needed.   vitamin B-12 (CYANOCOBALAMIN) 1000 MCG tablet Take 1,000 mcg by mouth daily.   [DISCONTINUED] insulin aspart (NOVOLOG) 100 UNIT/ML injection Inject into the skin as directed. 100-150=4UNITS 151-200=6UNITS 201-250=8UNITS GREATER THAN 250=10UNITS   No facility-administered encounter medications on file as of 04/24/2022.  Review of Systems  Constitutional:  Negative for appetite change, chills, fatigue and fever.  HENT:  Negative for congestion, hearing loss, rhinorrhea and sore throat.   Eyes: Negative.   Respiratory:  Negative for cough, shortness of breath and wheezing.   Cardiovascular:  Negative for chest pain, palpitations and leg swelling.  Gastrointestinal:  Negative for abdominal pain, constipation, diarrhea, nausea and vomiting.  Genitourinary:  Negative for dysuria.  Musculoskeletal:  Negative for arthralgias, back pain and myalgias.  Skin:  Negative for color change, rash and wound.  Neurological:  Negative for dizziness, weakness and headaches.  Psychiatric/Behavioral:  Negative for behavioral problems. The patient is not nervous/anxious.       Immunization History  Administered Date(s) Administered   Influenza Split 09/13/2007, 09/02/2009, 09/02/2010, 09/03/2011, 08/19/2012, 09/22/2013, 08/29/2016, 08/14/2018   Influenza, High Dose Seasonal PF 08/14/2018, 09/30/2019   Influenza, Quadrivalent, Recombinant, Inj, Pf 08/27/2017, 08/30/2021   Influenza,inj,Quad  PF,6+ Mos 11/09/2020   Influenza,inj,quad, With Preservative 08/05/2014, 10/18/2015   Janssen (J&J) SARS-COV-2 Vaccination 04/13/2020   Pfizer Covid-19 Vaccine Bivalent Booster 24yrs & up 01/04/2021   Pneumococcal Conjugate-13 09/08/2008, 01/11/2015   Td 07/04/2007   Unspecified SARS-COV-2 Vaccination 03/03/2020   Pertinent  Health Maintenance Due  Topic Date Due   OPHTHALMOLOGY EXAM  Never done   INFLUENZA VACCINE  07/03/2022   HEMOGLOBIN A1C  07/19/2022   FOOT EXAM  02/22/2023   DEXA SCAN  Completed      10/22/2021    8:00 AM 10/22/2021    7:15 PM 10/24/2021   10:08 AM 02/13/2022    8:58 AM 02/13/2022    9:36 PM  Fall Risk  Patient Fall Risk Level High fall risk High fall risk High fall risk Low fall risk Low fall risk     Vitals:   04/24/22 1337  BP: (!) 117/51  Pulse: 66  Resp: 20  Temp: 97.7 F (36.5 C)  Weight: 222 lb (100.7 kg)  Height: 5\' 6"  (1.676 m)   Body mass index is 35.83 kg/m.  Physical Exam Constitutional:      General: She is not in acute distress.    Appearance: She is obese.  HENT:     Head: Normocephalic and atraumatic.     Nose: Nose normal.     Mouth/Throat:     Mouth: Mucous membranes are moist.  Eyes:     Conjunctiva/sclera: Conjunctivae normal.  Cardiovascular:     Rate and Rhythm: Normal rate and regular rhythm.  Pulmonary:     Effort: Pulmonary effort is normal.     Breath sounds: Normal breath sounds.  Abdominal:     General: Bowel sounds are normal.     Palpations: Abdomen is soft.  Musculoskeletal:        General: Normal range of motion.     Cervical back: Normal range of motion.  Skin:    General: Skin is warm and dry.  Neurological:     General: No focal deficit present.     Mental Status: She is alert and oriented to person, place, and time.  Psychiatric:        Mood and Affect: Mood normal.        Behavior: Behavior normal.        Thought Content: Thought content normal.        Judgment: Judgment normal.        Labs reviewed: Recent Labs    10/13/21 0324 10/14/21 0339 10/20/21 0216 10/21/21 0156 10/22/21 0309 10/23/21 0551 10/24/21 1045 10/24/21  1111 01/29/22 0000 02/12/22 0000 02/13/22 0947  NA 133*   < > 131* 127*   < > 132* 136   < > 136* 138 136  K 4.4   < > 4.3 4.8   < > 4.9 4.7   < > 3.4* 3.4* 3.1*  CL 106   < > 99 98   < > 103 107   < > 96* 96* 97*  CO2 20*   < > 25 20*   < > 23 19*   < > 29* 30* 30  GLUCOSE 173*   < > 128* 193*   < > 175* 190*  --   --   --  318*  BUN 23   < > 35* 53*   < > 50* 35*   < > CREATININE 1.14*   < > 1.45* 2.02*   < > 1.51* 1.16*   < > 0.7 0.8 0.94  CALCIUM 8.1*   < > 8.3* 8.0*   < > 7.9* 8.3*   < > 8.6* 8.8 8.9  MG 1.6*  --  1.8 1.9  --   --   --   --   --   --   --    < > = values in this interval not displayed.   Recent Labs    10/11/21 1815 10/24/21 1045 01/09/22 0000 02/13/22 0947  AST 14* 20 7* 15  ALT 8 20  --  11  ALKPHOS 81 89  --  72  BILITOT 0.4 0.6  --  1.1  PROT 5.8* 4.9*  --  5.9*  ALBUMIN 3.4* 2.2* 3.4* 3.2*   Recent Labs    10/23/21 0551 10/24/21 1045 10/24/21 1111 12/29/21 0000 01/09/22 0000 01/22/22 0000 01/29/22 0000 01/30/22 0000 02/13/22 0947  WBC 11.4* 7.8   < > 10.2 8.2   < > 8.2 8.0 8.1  NEUTROABS 8.2* 5.4   < > 7.30 5.90  --   --   --  5.9  HGB 10.7* 14.0   < > 12.8 13.4   < > 13.6 12.5 14.0  HCT 32.3* 43.3   < >  --  41   < > 41 39 43.2  MCV 95.3 96.4  --   --   --   --   --   --  88.9  PLT 263 226   < > 190 163   < > 186 180 233   < > = values in this interval not displayed.   Lab Results  Component Value Date   TSH 5.98 (A) 01/19/2022   Lab Results  Component Value Date   HGBA1C 8.5 01/19/2022   Lab Results  Component Value Date   CHOL 149 08/12/2018   HDL 34 (L) 08/12/2018   LDLCALC 81 08/12/2018   TRIG 171 (H) 08/12/2018   CHOLHDL 4.4 08/12/2018    Significant Diagnostic Results in last 30 days:  No results found.  Assessment/Plan  1. ACP (advance care planning) -   remains to be full code -   discussed medications, vital signs and weights  2. Essential hypertension -Blood pressure well controlled Continue current medications  3. Coronary artery disease involving coronary bypass graft of native heart with angina pectoris (HCC) -  denies chest pains, continue NTG PRN and Isosorbide [place] ER  4. DM (diabetes mellitus), type 2 with peripheral vascular complications [facility]) Lab Results  Component Value Date   HGBA1C 8.0 04/16/2022   -  CBGs stable, continue  Humalog Insulin and Insulin Glargine -  monitor CBGs  5. Diabetic polyneuropathy associated with type 2 diabetes mellitus (HCC) -  stable, continue Lyrica  6. Chronic diastolic CHF (congestive heart failure) (HCC) -  no SOB, continue Furosemide   Family/ staff Communication: Discussed plan of care with resident, daughter and IDT.  Labs/tests ordered:   None    Kenard Gower, DNP, MSN, FNP-BC [facility] [phone] (Monday-Friday 8:00 a.m. - 5:00 p.m.) [phone] (after hours)

## 2022-05-11 DIAGNOSIS — L299 Pruritus, unspecified: Secondary | ICD-10-CM | POA: Diagnosis not present

## 2022-05-11 DIAGNOSIS — L989 Disorder of the skin and subcutaneous tissue, unspecified: Secondary | ICD-10-CM | POA: Diagnosis not present

## 2022-05-17 DIAGNOSIS — I10 Essential (primary) hypertension: Secondary | ICD-10-CM | POA: Diagnosis not present

## 2022-05-17 DIAGNOSIS — I739 Peripheral vascular disease, unspecified: Secondary | ICD-10-CM | POA: Diagnosis not present

## 2022-05-17 DIAGNOSIS — S82842D Displaced bimalleolar fracture of left lower leg, subsequent encounter for closed fracture with routine healing: Secondary | ICD-10-CM | POA: Diagnosis not present

## 2022-05-25 DIAGNOSIS — E039 Hypothyroidism, unspecified: Secondary | ICD-10-CM | POA: Diagnosis not present

## 2022-05-30 ENCOUNTER — Ambulatory Visit (INDEPENDENT_AMBULATORY_CARE_PROVIDER_SITE_OTHER): Payer: Medicare Other | Admitting: Podiatry

## 2022-05-30 DIAGNOSIS — B351 Tinea unguium: Secondary | ICD-10-CM | POA: Diagnosis not present

## 2022-05-30 DIAGNOSIS — E0842 Diabetes mellitus due to underlying condition with diabetic polyneuropathy: Secondary | ICD-10-CM | POA: Diagnosis not present

## 2022-05-30 DIAGNOSIS — M79674 Pain in right toe(s): Secondary | ICD-10-CM | POA: Diagnosis not present

## 2022-05-30 DIAGNOSIS — M79675 Pain in left toe(s): Secondary | ICD-10-CM | POA: Diagnosis not present

## 2022-06-03 DIAGNOSIS — M6281 Muscle weakness (generalized): Secondary | ICD-10-CM | POA: Diagnosis not present

## 2022-06-03 DIAGNOSIS — R279 Unspecified lack of coordination: Secondary | ICD-10-CM | POA: Diagnosis not present

## 2022-06-03 DIAGNOSIS — M1990 Unspecified osteoarthritis, unspecified site: Secondary | ICD-10-CM | POA: Diagnosis not present

## 2022-06-03 DIAGNOSIS — M6259 Muscle wasting and atrophy, not elsewhere classified, multiple sites: Secondary | ICD-10-CM | POA: Diagnosis not present

## 2022-06-03 DIAGNOSIS — I5032 Chronic diastolic (congestive) heart failure: Secondary | ICD-10-CM | POA: Diagnosis not present

## 2022-06-03 DIAGNOSIS — R2689 Other abnormalities of gait and mobility: Secondary | ICD-10-CM | POA: Diagnosis not present

## 2022-06-03 DIAGNOSIS — Z741 Need for assistance with personal care: Secondary | ICD-10-CM | POA: Diagnosis not present

## 2022-06-04 DIAGNOSIS — R2689 Other abnormalities of gait and mobility: Secondary | ICD-10-CM | POA: Diagnosis not present

## 2022-06-04 DIAGNOSIS — M1990 Unspecified osteoarthritis, unspecified site: Secondary | ICD-10-CM | POA: Diagnosis not present

## 2022-06-04 DIAGNOSIS — R279 Unspecified lack of coordination: Secondary | ICD-10-CM | POA: Diagnosis not present

## 2022-06-04 DIAGNOSIS — I5032 Chronic diastolic (congestive) heart failure: Secondary | ICD-10-CM | POA: Diagnosis not present

## 2022-06-04 DIAGNOSIS — Z741 Need for assistance with personal care: Secondary | ICD-10-CM | POA: Diagnosis not present

## 2022-06-04 DIAGNOSIS — M6281 Muscle weakness (generalized): Secondary | ICD-10-CM | POA: Diagnosis not present

## 2022-06-04 DIAGNOSIS — M6259 Muscle wasting and atrophy, not elsewhere classified, multiple sites: Secondary | ICD-10-CM | POA: Diagnosis not present

## 2022-06-05 DIAGNOSIS — R279 Unspecified lack of coordination: Secondary | ICD-10-CM | POA: Diagnosis not present

## 2022-06-05 DIAGNOSIS — M6281 Muscle weakness (generalized): Secondary | ICD-10-CM | POA: Diagnosis not present

## 2022-06-05 DIAGNOSIS — I5032 Chronic diastolic (congestive) heart failure: Secondary | ICD-10-CM | POA: Diagnosis not present

## 2022-06-05 DIAGNOSIS — M1990 Unspecified osteoarthritis, unspecified site: Secondary | ICD-10-CM | POA: Diagnosis not present

## 2022-06-05 DIAGNOSIS — Z741 Need for assistance with personal care: Secondary | ICD-10-CM | POA: Diagnosis not present

## 2022-06-05 DIAGNOSIS — M6259 Muscle wasting and atrophy, not elsewhere classified, multiple sites: Secondary | ICD-10-CM | POA: Diagnosis not present

## 2022-06-05 DIAGNOSIS — R2689 Other abnormalities of gait and mobility: Secondary | ICD-10-CM | POA: Diagnosis not present

## 2022-06-06 ENCOUNTER — Encounter: Payer: Self-pay | Admitting: Podiatry

## 2022-06-06 DIAGNOSIS — R2689 Other abnormalities of gait and mobility: Secondary | ICD-10-CM | POA: Diagnosis not present

## 2022-06-06 DIAGNOSIS — I5032 Chronic diastolic (congestive) heart failure: Secondary | ICD-10-CM | POA: Diagnosis not present

## 2022-06-06 DIAGNOSIS — R279 Unspecified lack of coordination: Secondary | ICD-10-CM | POA: Diagnosis not present

## 2022-06-06 DIAGNOSIS — M6281 Muscle weakness (generalized): Secondary | ICD-10-CM | POA: Diagnosis not present

## 2022-06-06 DIAGNOSIS — M1990 Unspecified osteoarthritis, unspecified site: Secondary | ICD-10-CM | POA: Diagnosis not present

## 2022-06-06 DIAGNOSIS — Z741 Need for assistance with personal care: Secondary | ICD-10-CM | POA: Diagnosis not present

## 2022-06-06 DIAGNOSIS — M6259 Muscle wasting and atrophy, not elsewhere classified, multiple sites: Secondary | ICD-10-CM | POA: Diagnosis not present

## 2022-06-06 NOTE — Progress Notes (Signed)
  Subjective:  Patient ID: Dudley Major, female    DOB: 1934/08/01,  MRN: [id number]  Dudley Major presents to clinic today for at risk foot care with history of diabetic neuropathy and thick, elongated toenails b/l lower extremities which are tender when wearing enclosed shoe gear.  Patient  is a resident of [place] and [address].  Last known HgA1c was unknown.  Patient not aware of blood glucose reading on today's visit.  New problem(s): None.   PCP is Medina-Vargas, Monina C, NP , and last visit was Apr 24, 2021.  Allergies  Allergen Reactions   Duloxetine Hcl Nausea And Vomiting and Other (See Comments)   Statins Other (See Comments)    Causes myalgias Other reaction(s): Unknown   Amitriptyline Other (See Comments)    Pt. States that it caused it to be suicidal.    Benadryl [Diphenhydramine Hcl (Sleep)] Other (See Comments)    Makes the patient very "hyper"   Ezetimibe     Other reaction(s): Unknown Other reaction(s): Unknown   Gabapentin Other (See Comments)   Metformin Hcl     Other reaction(s): Unknown Other reaction(s): Unknown   Other     Other reaction(s): Unknown   Sitagliptin     Other reaction(s): Unknown Other reaction(s): Unknown   Tylenol [Acetaminophen] Nausea Only   Colesevelam Hcl Other (See Comments)    Causes dizziness Other reaction(s): Unknown    Review of Systems: Negative except as noted in the HPI.  Objective: No changes noted in today's physical examination. Constitutional Patient is a pleasant 88 y.o. Caucasian female obese in NAD. AAO x 3.  Vascular Capillary fill time to digits <3 seconds b/l lower extremities. Faintly palpable DP pulse(s) b/l lower extremities. Faintly palpable PT pulse(s) b/l lower extremities. Pedal hair absent. Lower extremity skin temperature gradient within normal limits. No pain with calf compression b/l. No edema noted b/l lower extremities. No cyanosis or clubbing noted.  Neurologic Normal speech. Pt has  subjective symptoms of neuropathy. Protective sensation decreased with 10 gram monofilament b/l.  Dermatologic Pedal skin with normal turgor, texture and tone bilaterally. No open wounds bilaterally. No interdigital macerations bilaterally. Toenails 1-5 b/l elongated, discolored, dystrophic, thickened, crumbly with subungual debris and tenderness to dorsal palpation.   Orthopedic: Normal muscle strength 5/5 to all lower extremity muscle groups bilaterally. No pain crepitus or joint limitation noted with ROM b/l. Hallux valgus with bunion deformity noted b/l lower extremities. Patient presents in wheelchair.      04/16/2022   12:00 AM 01/19/2022   12:00 AM 10/12/2021    4:13 AM  Hemoglobin A1C  Hemoglobin-A1c 8.0     8.5     6.5      This result is from an external source.   Assessment/Plan: 1. Pain due to onychomycosis of toenails of both feet   2. Diabetic polyneuropathy associated with diabetes mellitus due to underlying condition [facility])   -Patient was evaluated and treated. All patient's and/or POA's questions/concerns answered on today's visit. -Patient wheelchair bound, nonambulatory. Continue pressure precautions daily. -Patient to continue soft, supportive shoe gear daily. -Mycotic toenails 1-5 bilaterally were debrided in length and girth with sterile nail nippers and dremel without incident. -Patient/POA to call should there be question/concern in the interim.   Return in about 3 months (around 08/30/2022).  Freddie Breech, DPM

## 2022-06-07 DIAGNOSIS — M6281 Muscle weakness (generalized): Secondary | ICD-10-CM | POA: Diagnosis not present

## 2022-06-07 DIAGNOSIS — M6259 Muscle wasting and atrophy, not elsewhere classified, multiple sites: Secondary | ICD-10-CM | POA: Diagnosis not present

## 2022-06-07 DIAGNOSIS — Z741 Need for assistance with personal care: Secondary | ICD-10-CM | POA: Diagnosis not present

## 2022-06-07 DIAGNOSIS — I5032 Chronic diastolic (congestive) heart failure: Secondary | ICD-10-CM | POA: Diagnosis not present

## 2022-06-07 DIAGNOSIS — R2689 Other abnormalities of gait and mobility: Secondary | ICD-10-CM | POA: Diagnosis not present

## 2022-06-07 DIAGNOSIS — M1990 Unspecified osteoarthritis, unspecified site: Secondary | ICD-10-CM | POA: Diagnosis not present

## 2022-06-07 DIAGNOSIS — R279 Unspecified lack of coordination: Secondary | ICD-10-CM | POA: Diagnosis not present

## 2022-06-08 DIAGNOSIS — I5032 Chronic diastolic (congestive) heart failure: Secondary | ICD-10-CM | POA: Diagnosis not present

## 2022-06-08 DIAGNOSIS — R2689 Other abnormalities of gait and mobility: Secondary | ICD-10-CM | POA: Diagnosis not present

## 2022-06-08 DIAGNOSIS — M1990 Unspecified osteoarthritis, unspecified site: Secondary | ICD-10-CM | POA: Diagnosis not present

## 2022-06-08 DIAGNOSIS — R279 Unspecified lack of coordination: Secondary | ICD-10-CM | POA: Diagnosis not present

## 2022-06-08 DIAGNOSIS — M6259 Muscle wasting and atrophy, not elsewhere classified, multiple sites: Secondary | ICD-10-CM | POA: Diagnosis not present

## 2022-06-08 DIAGNOSIS — M6281 Muscle weakness (generalized): Secondary | ICD-10-CM | POA: Diagnosis not present

## 2022-06-08 DIAGNOSIS — Z741 Need for assistance with personal care: Secondary | ICD-10-CM | POA: Diagnosis not present

## 2022-06-09 ENCOUNTER — Encounter (HOSPITAL_COMMUNITY): Payer: Self-pay | Admitting: Internal Medicine

## 2022-06-09 ENCOUNTER — Other Ambulatory Visit: Payer: Self-pay

## 2022-06-09 ENCOUNTER — Inpatient Hospital Stay (HOSPITAL_COMMUNITY)
Admission: EM | Admit: 2022-06-09 | Discharge: 2022-06-12 | DRG: 309 | Disposition: A | Discharge status: Skilled Nursing Facility | Payer: Medicare Other | Source: Skilled Nursing Facility | Attending: Internal Medicine | Admitting: Internal Medicine

## 2022-06-09 ENCOUNTER — Emergency Department (HOSPITAL_COMMUNITY): Payer: Medicare Other

## 2022-06-09 DIAGNOSIS — I130 Hypertensive heart and chronic kidney disease with heart failure and stage 1 through stage 4 chronic kidney disease, or unspecified chronic kidney disease: Secondary | ICD-10-CM | POA: Diagnosis not present

## 2022-06-09 DIAGNOSIS — I5032 Chronic diastolic (congestive) heart failure: Secondary | ICD-10-CM | POA: Diagnosis not present

## 2022-06-09 DIAGNOSIS — N179 Acute kidney failure, unspecified: Secondary | ICD-10-CM | POA: Diagnosis present

## 2022-06-09 DIAGNOSIS — I480 Paroxysmal atrial fibrillation: Secondary | ICD-10-CM | POA: Diagnosis present

## 2022-06-09 DIAGNOSIS — Z8249 Family history of ischemic heart disease and other diseases of the circulatory system: Secondary | ICD-10-CM | POA: Diagnosis not present

## 2022-06-09 DIAGNOSIS — Z1611 Resistance to penicillins: Secondary | ICD-10-CM | POA: Diagnosis not present

## 2022-06-09 DIAGNOSIS — E119 Type 2 diabetes mellitus without complications: Secondary | ICD-10-CM | POA: Diagnosis not present

## 2022-06-09 DIAGNOSIS — E1165 Type 2 diabetes mellitus with hyperglycemia: Secondary | ICD-10-CM | POA: Diagnosis present

## 2022-06-09 DIAGNOSIS — N390 Urinary tract infection, site not specified: Secondary | ICD-10-CM | POA: Diagnosis not present

## 2022-06-09 DIAGNOSIS — E1140 Type 2 diabetes mellitus with diabetic neuropathy, unspecified: Secondary | ICD-10-CM | POA: Diagnosis not present

## 2022-06-09 DIAGNOSIS — K5900 Constipation, unspecified: Secondary | ICD-10-CM | POA: Diagnosis not present

## 2022-06-09 DIAGNOSIS — I441 Atrioventricular block, second degree: Principal | ICD-10-CM | POA: Diagnosis present

## 2022-06-09 DIAGNOSIS — Z7401 Bed confinement status: Secondary | ICD-10-CM

## 2022-06-09 DIAGNOSIS — Z79899 Other long term (current) drug therapy: Secondary | ICD-10-CM | POA: Diagnosis not present

## 2022-06-09 DIAGNOSIS — B961 Klebsiella pneumoniae [K. pneumoniae] as the cause of diseases classified elsewhere: Secondary | ICD-10-CM | POA: Diagnosis present

## 2022-06-09 DIAGNOSIS — Z794 Long term (current) use of insulin: Secondary | ICD-10-CM

## 2022-06-09 DIAGNOSIS — I4510 Unspecified right bundle-branch block: Secondary | ICD-10-CM | POA: Diagnosis present

## 2022-06-09 DIAGNOSIS — I491 Atrial premature depolarization: Secondary | ICD-10-CM | POA: Diagnosis not present

## 2022-06-09 DIAGNOSIS — E039 Hypothyroidism, unspecified: Secondary | ICD-10-CM | POA: Diagnosis present

## 2022-06-09 DIAGNOSIS — Z8673 Personal history of transient ischemic attack (TIA), and cerebral infarction without residual deficits: Secondary | ICD-10-CM

## 2022-06-09 DIAGNOSIS — Z7901 Long term (current) use of anticoagulants: Secondary | ICD-10-CM

## 2022-06-09 DIAGNOSIS — I10 Essential (primary) hypertension: Secondary | ICD-10-CM | POA: Diagnosis not present

## 2022-06-09 DIAGNOSIS — N1831 Chronic kidney disease, stage 3a: Secondary | ICD-10-CM | POA: Diagnosis present

## 2022-06-09 DIAGNOSIS — Z993 Dependence on wheelchair: Secondary | ICD-10-CM

## 2022-06-09 DIAGNOSIS — E1122 Type 2 diabetes mellitus with diabetic chronic kidney disease: Secondary | ICD-10-CM | POA: Diagnosis not present

## 2022-06-09 DIAGNOSIS — Z951 Presence of aortocoronary bypass graft: Secondary | ICD-10-CM | POA: Diagnosis not present

## 2022-06-09 DIAGNOSIS — Z79890 Hormone replacement therapy (postmenopausal): Secondary | ICD-10-CM

## 2022-06-09 DIAGNOSIS — Z886 Allergy status to analgesic agent status: Secondary | ICD-10-CM | POA: Diagnosis not present

## 2022-06-09 DIAGNOSIS — R627 Adult failure to thrive: Secondary | ICD-10-CM | POA: Diagnosis present

## 2022-06-09 DIAGNOSIS — R6889 Other general symptoms and signs: Secondary | ICD-10-CM | POA: Diagnosis not present

## 2022-06-09 DIAGNOSIS — I499 Cardiac arrhythmia, unspecified: Secondary | ICD-10-CM | POA: Diagnosis not present

## 2022-06-09 DIAGNOSIS — Z6838 Body mass index (BMI) 38.0-38.9, adult: Secondary | ICD-10-CM | POA: Diagnosis not present

## 2022-06-09 DIAGNOSIS — Z888 Allergy status to other drugs, medicaments and biological substances status: Secondary | ICD-10-CM | POA: Diagnosis not present

## 2022-06-09 DIAGNOSIS — R531 Weakness: Secondary | ICD-10-CM | POA: Diagnosis not present

## 2022-06-09 DIAGNOSIS — R778 Other specified abnormalities of plasma proteins: Secondary | ICD-10-CM | POA: Diagnosis not present

## 2022-06-09 DIAGNOSIS — E669 Obesity, unspecified: Secondary | ICD-10-CM | POA: Diagnosis present

## 2022-06-09 DIAGNOSIS — R001 Bradycardia, unspecified: Secondary | ICD-10-CM | POA: Diagnosis not present

## 2022-06-09 DIAGNOSIS — I2510 Atherosclerotic heart disease of native coronary artery without angina pectoris: Secondary | ICD-10-CM | POA: Diagnosis not present

## 2022-06-09 DIAGNOSIS — N189 Chronic kidney disease, unspecified: Secondary | ICD-10-CM | POA: Diagnosis not present

## 2022-06-09 DIAGNOSIS — Z743 Need for continuous supervision: Secondary | ICD-10-CM | POA: Diagnosis not present

## 2022-06-09 DIAGNOSIS — R0600 Dyspnea, unspecified: Secondary | ICD-10-CM | POA: Diagnosis not present

## 2022-06-09 DIAGNOSIS — R1084 Generalized abdominal pain: Secondary | ICD-10-CM | POA: Diagnosis not present

## 2022-06-09 LAB — CBC WITH DIFFERENTIAL/PLATELET
Abs Immature Granulocytes: 0.06 10*3/uL (ref 0.00–0.07)
Basophils Absolute: 0.1 10*3/uL (ref 0.0–0.1)
Basophils Relative: 1 %
Eosinophils Absolute: 0.4 10*3/uL (ref 0.0–0.5)
Eosinophils Relative: 4 %
HCT: 40.4 % (ref 36.0–46.0)
Hemoglobin: 13 g/dL (ref 12.0–15.0)
Immature Granulocytes: 1 %
Lymphocytes Relative: 28 %
Lymphs Abs: 2.8 10*3/uL (ref 0.7–4.0)
MCH: 30.2 pg (ref 26.0–34.0)
MCHC: 32.2 g/dL (ref 30.0–36.0)
MCV: 94 fL (ref 80.0–100.0)
Monocytes Absolute: 0.8 10*3/uL (ref 0.1–1.0)
Monocytes Relative: 8 %
Neutro Abs: 5.9 10*3/uL (ref 1.7–7.7)
Neutrophils Relative %: 58 %
Platelets: 200 10*3/uL (ref 150–400)
RBC: 4.3 MIL/uL (ref 3.87–5.11)
RDW: 14.1 % (ref 11.5–15.5)
WBC: 10.1 10*3/uL (ref 4.0–10.5)
nRBC: 0 % (ref 0.0–0.2)

## 2022-06-09 LAB — URINALYSIS, ROUTINE W REFLEX MICROSCOPIC
Bilirubin Urine: NEGATIVE
Glucose, UA: NEGATIVE mg/dL
Hgb urine dipstick: NEGATIVE
Ketones, ur: NEGATIVE mg/dL
Nitrite: NEGATIVE
Protein, ur: NEGATIVE mg/dL
Specific Gravity, Urine: 1.013 (ref 1.005–1.030)
WBC, UA: >50 WBC/hpf — ABNORMAL HIGH (ref 0–5)
pH: 5 (ref 5.0–8.0)

## 2022-06-09 LAB — COMPREHENSIVE METABOLIC PANEL
ALT: 7 U/L (ref 0–44)
AST: 20 U/L (ref 15–41)
Albumin: 3.2 g/dL — ABNORMAL LOW (ref 3.5–5.0)
Alkaline Phosphatase: 80 U/L (ref 38–126)
Anion gap: 11 (ref 5–15)
BUN: 33 mg/dL — ABNORMAL HIGH (ref 8–23)
CO2: 24 mmol/L (ref 22–32)
Calcium: 9 mg/dL (ref 8.9–10.3)
Chloride: 104 mmol/L (ref 98–111)
Creatinine, Ser: 1.35 mg/dL — ABNORMAL HIGH (ref 0.44–1.00)
GFR, Estimated: 38 mL/min — ABNORMAL LOW (ref >60)
Glucose, Bld: 118 mg/dL — ABNORMAL HIGH (ref 70–99)
Potassium: 3.8 mmol/L (ref 3.5–5.1)
Sodium: 139 mmol/L (ref 135–145)
Total Bilirubin: 0.6 mg/dL (ref 0.3–1.2)
Total Protein: 5.8 g/dL — ABNORMAL LOW (ref 6.5–8.1)

## 2022-06-09 LAB — GLUCOSE, CAPILLARY
Glucose-Capillary: 141 mg/dL — ABNORMAL HIGH (ref 70–99)
Glucose-Capillary: 206 mg/dL — ABNORMAL HIGH (ref 70–99)
Glucose-Capillary: 149 mg/dL — ABNORMAL HIGH (ref 70–99)
Glucose-Capillary: 153 mg/dL — ABNORMAL HIGH (ref 70–99)

## 2022-06-09 LAB — MAGNESIUM: Magnesium: 1.9 mg/dL (ref 1.7–2.4)

## 2022-06-09 LAB — TROPONIN I (HIGH SENSITIVITY)
Troponin I (High Sensitivity): 31 ng/L — ABNORMAL HIGH (ref <18)
Troponin I (High Sensitivity): 31 ng/L — ABNORMAL HIGH (ref <18)

## 2022-06-09 LAB — TSH: TSH: 4.104 u[IU]/mL (ref 0.350–4.500)

## 2022-06-09 LAB — MRSA NEXT GEN BY PCR, NASAL: MRSA by PCR Next Gen: NOT DETECTED

## 2022-06-09 MED ORDER — INSULIN ASPART 100 UNIT/ML IJ SOLN
5.0000 [IU] | Freq: Two times a day (BID) | INTRAMUSCULAR | Status: DC
  Administered 2022-06-09 – 2022-06-12 (×7): 5 [IU] via SUBCUTANEOUS

## 2022-06-09 MED ORDER — LEVOTHYROXINE SODIUM 112 MCG PO TABS
112.0000 ug | ORAL_TABLET | Freq: Every day | ORAL | Status: DC
  Administered 2022-06-10 – 2022-06-12 (×3): 112 ug via ORAL
  Filled 2022-06-09 (×3): qty 1

## 2022-06-09 MED ORDER — PNEUMOCOCCAL 20-VAL CONJ VACC 0.5 ML IM SUSY
0.5000 mL | PREFILLED_SYRINGE | INTRAMUSCULAR | Status: DC
  Filled 2022-06-09: qty 0.5

## 2022-06-09 MED ORDER — ACETAMINOPHEN 650 MG RE SUPP: 650.0000 mg | Freq: Four times a day (QID) | RECTAL | Status: DC | PRN

## 2022-06-09 MED ORDER — OXYCODONE HCL 5 MG PO TABS
5.0000 mg | ORAL_TABLET | Freq: Three times a day (TID) | ORAL | Status: DC | PRN
  Administered 2022-06-09 – 2022-06-10 (×3): 5 mg via ORAL
  Filled 2022-06-09 (×3): qty 1

## 2022-06-09 MED ORDER — NYSTATIN 100000 UNIT/GM EX POWD
1.0000 | Freq: Two times a day (BID) | CUTANEOUS | Status: DC
  Administered 2022-06-09 – 2022-06-12 (×5): 1 via TOPICAL
  Filled 2022-06-09: qty 15

## 2022-06-09 MED ORDER — SODIUM CHLORIDE 0.9% FLUSH
3.0000 mL | Freq: Two times a day (BID) | INTRAVENOUS | Status: DC
  Administered 2022-06-09 – 2022-06-12 (×7): 3 mL via INTRAVENOUS

## 2022-06-09 MED ORDER — ACETAMINOPHEN 325 MG PO TABS
650.0000 mg | ORAL_TABLET | Freq: Four times a day (QID) | ORAL | Status: DC | PRN
  Filled 2022-06-09: qty 2

## 2022-06-09 MED ORDER — MUPIROCIN 2 % EX OINT
1.0000 | TOPICAL_OINTMENT | Freq: Two times a day (BID) | CUTANEOUS | Status: DC
  Administered 2022-06-09 – 2022-06-12 (×5): 1 via TOPICAL
  Filled 2022-06-09: qty 22

## 2022-06-09 MED ORDER — SODIUM CHLORIDE 0.9 % IV SOLN
1.0000 g | INTRAVENOUS | Status: DC
  Administered 2022-06-09 – 2022-06-11 (×3): 1 g via INTRAVENOUS
  Filled 2022-06-09 (×3): qty 10

## 2022-06-09 MED ORDER — PREGABALIN 25 MG PO CAPS
50.0000 mg | ORAL_CAPSULE | Freq: Two times a day (BID) | ORAL | Status: DC
  Administered 2022-06-09 – 2022-06-12 (×6): 50 mg via ORAL
  Filled 2022-06-09 (×6): qty 2

## 2022-06-09 MED ORDER — INSULIN GLARGINE-YFGN 100 UNIT/ML ~~LOC~~ SOLN
20.0000 [IU] | Freq: Every day | SUBCUTANEOUS | Status: DC
  Administered 2022-06-09 – 2022-06-12 (×4): 20 [IU] via SUBCUTANEOUS
  Filled 2022-06-09 (×4): qty 0.2

## 2022-06-09 MED ORDER — AMLODIPINE BESYLATE 10 MG PO TABS
10.0000 mg | ORAL_TABLET | Freq: Every day | ORAL | Status: DC
  Administered 2022-06-10 – 2022-06-12 (×3): 10 mg via ORAL
  Filled 2022-06-09 (×3): qty 1

## 2022-06-09 MED ORDER — INSULIN ASPART 100 UNIT/ML IJ SOLN
0.0000 [IU] | Freq: Three times a day (TID) | INTRAMUSCULAR | Status: DC
  Administered 2022-06-09: 3 [IU] via SUBCUTANEOUS
  Administered 2022-06-09: 1 [IU] via SUBCUTANEOUS
  Administered 2022-06-10: 2 [IU] via SUBCUTANEOUS
  Administered 2022-06-10 (×2): 3 [IU] via SUBCUTANEOUS
  Administered 2022-06-11: 2 [IU] via SUBCUTANEOUS
  Administered 2022-06-11: 1 [IU] via SUBCUTANEOUS
  Administered 2022-06-11: 2 [IU] via SUBCUTANEOUS
  Administered 2022-06-12: 3 [IU] via SUBCUTANEOUS
  Administered 2022-06-12: 2 [IU] via SUBCUTANEOUS

## 2022-06-09 MED ORDER — CARBIDOPA-LEVODOPA 25-100 MG PO TABS
1.5000 | ORAL_TABLET | Freq: Every day | ORAL | Status: DC
  Administered 2022-06-09 – 2022-06-11 (×3): 1.5 via ORAL
  Filled 2022-06-09 (×4): qty 1.5

## 2022-06-09 MED ORDER — ONDANSETRON HCL 4 MG PO TABS: 4.0000 mg | ORAL_TABLET | Freq: Four times a day (QID) | ORAL | Status: DC | PRN

## 2022-06-09 MED ORDER — ISOSORBIDE MONONITRATE ER 30 MG PO TB24
30.0000 mg | ORAL_TABLET | Freq: Every day | ORAL | Status: DC
  Administered 2022-06-10 – 2022-06-12 (×3): 30 mg via ORAL
  Filled 2022-06-09 (×3): qty 1

## 2022-06-09 MED ORDER — SACCHAROMYCES BOULARDII 250 MG PO CAPS
250.0000 mg | ORAL_CAPSULE | Freq: Every day | ORAL | Status: DC
  Administered 2022-06-10 – 2022-06-12 (×3): 250 mg via ORAL
  Filled 2022-06-09 (×3): qty 1

## 2022-06-09 MED ORDER — HYDRALAZINE HCL 25 MG PO TABS
25.0000 mg | ORAL_TABLET | Freq: Three times a day (TID) | ORAL | Status: DC
  Administered 2022-06-10 – 2022-06-12 (×6): 25 mg via ORAL
  Filled 2022-06-09 (×6): qty 1

## 2022-06-09 MED ORDER — ONDANSETRON HCL 4 MG/2ML IJ SOLN
4.0000 mg | Freq: Four times a day (QID) | INTRAMUSCULAR | Status: DC | PRN
  Administered 2022-06-09: 4 mg via INTRAVENOUS
  Filled 2022-06-09: qty 2

## 2022-06-09 MED ORDER — MECLIZINE HCL 25 MG PO TABS
25.0000 mg | ORAL_TABLET | Freq: Every day | ORAL | Status: DC
  Administered 2022-06-10 – 2022-06-12 (×3): 25 mg via ORAL
  Filled 2022-06-09 (×3): qty 1

## 2022-06-09 MED ORDER — APIXABAN 5 MG PO TABS
5.0000 mg | ORAL_TABLET | Freq: Two times a day (BID) | ORAL | Status: DC
  Administered 2022-06-10 – 2022-06-12 (×5): 5 mg via ORAL
  Filled 2022-06-09 (×5): qty 1

## 2022-06-09 MED ORDER — ALBUTEROL SULFATE (2.5 MG/3ML) 0.083% IN NEBU
2.5000 mg | INHALATION_SOLUTION | Freq: Four times a day (QID) | RESPIRATORY_TRACT | Status: DC | PRN
Start: 2022-06-09 — End: 2022-06-12

## 2022-06-09 MED ORDER — BISACODYL 10 MG RE SUPP: 10.0000 mg | Freq: Every day | RECTAL | Status: DC | PRN

## 2022-06-09 NOTE — ED Notes (Signed)
Sherrie daughter [phone] requesting an update on the patient

## 2022-06-09 NOTE — Care Management Obs Status (Signed)
MEDICARE OBSERVATION STATUS NOTIFICATION   Patient Details  Name: Sierra Wright MRN: [id number] Date of Birth: Jul 26, 1934   Medicare Observation Status Notification Given:  Yes    Edgerrin Correia G., RN 06/09/2022, 2:42 PM

## 2022-06-09 NOTE — H&P (Addendum)
History and Physical    Patient: Sierra Wright DOB: 29-Mar-1934 DOA: 06/09/2022 DOS: the patient was seen and examined on 06/09/2022 PCP: Gillis Santa, NP  Patient coming from: [facility] via EMS  Chief Complaint:  Chief Complaint  Patient presents with   Bradycardia    Patient arrives from [place] due to bradycardia. Per staff at [facility] patient's HR usually in the 60s and tonight it was in the 30's and 40s. Patient arrives alert, oriented and answering all questions appropriately.   HPI: Sierra Wright is a 88 y.o. female with medical history significant of paroxysmal atrial fibrillation on Eliquis, CAD s/p CABG, HFpEF, diabetes mellitus type 2, hypothyroidism, RLS who presents due to slow heart rates.  She had been at the rehab after suffering ankle fractures last year.  Denies having any chest pain, palpitations, or shortness of breath.  She spends most of her time in a wheelchair due to issues with her ankles as well as knees.  The main thing that she reported was burning discomfort with urination over the last 2 days that previously had not been present and feeling cold for which she wanted the door to her room closed so that the heat could stay in.  On admission into the emergency department patient was seen to be afebrile, pulse 25-88, respirations 14-23, blood pressures 116/75 to 158/41, and O2 saturation maintained on room air.  Labs significant for creatinine 33, BUN 1.35, and high-sensitivity troponin 31->31.  Chest x-ray noted mild scarring of the left base.  Cardiology had been consulted and recommended discontinuation of beta-blocker.   Review of Systems: As mentioned in the history of present illness. All other systems reviewed and are negative. Past Medical History:  Diagnosis Date   Arthritis    Coronary artery disease 1999   CABG   Depression    Diabetes mellitus with vascular disease    type II; metformin and Amaryl   Diabetic neuropathy  (HCC)    History of anemia as a child    HOH (hard of hearing)    Hypertension    Hypothyroidism    synthroid   PONV (postoperative nausea and vomiting)    Restless leg syndrome    S/P cardiac cath 08/13/18 stable CAD 08/14/2018   Shortness of breath 09/03/2012   ECHO- The LA is Moderately Dilated, mild mitral annular calcification. mild pulmonary hypertension, moderate concentric LV hypertrophy. Atrial septum is aneurysmal. Aortic valve appears to be mildly sclerotic.EF 88%   Urinary incontinence    Weakness of both legs    Past Surgical History:  Procedure Laterality Date   ABDOMINAL HYSTERECTOMY     APPENDECTOMY     BACK SURGERY     CARDIAC CATHERIZATION  08/20/03   Widely patent bypass grafts. Normal left ventricular systolic function.   CARDIAC CATHETERIZATION     CHOLECYSTECTOMY     COLONOSCOPY     CORONARY ARTERY BYPASS GRAFT     EYE SURGERY     bilateral cataracts   LEFT HEART CATH AND CORONARY ANGIOGRAPHY N/A 08/13/2018   Procedure: LEFT HEART CATH AND CORONARY ANGIOGRAPHY;  Surgeon: Kathleene Hazel, MD;  Location: [facility] INVASIVE CV LAB;  Service: Cardiovascular;  Laterality: N/A;   LUMBAR LAMINECTOMY/DECOMPRESSION MICRODISCECTOMY N/A 07/26/2015   Procedure: BILATERAL Lumbar 3-4 SUBTOTAL HEMILAMINECTOMY WITH LATERAL RECESS DECOMPRESSION;  Surgeon: Kerrin Champagne, MD;  Location: [facility] OR;  Service: Orthopedics;  Laterality: N/A;   SHOULDER SURGERY     TUBAL LIGATION  Social History:  reports that she has never smoked. She has never been exposed to tobacco smoke. She has never used smokeless tobacco. She reports that she does not drink alcohol and does not use drugs.  Allergies  Allergen Reactions   Duloxetine Hcl Nausea And Vomiting and Other (See Comments)   Statins Other (See Comments)    Causes myalgias Other reaction(s): Unknown   Amitriptyline Other (See Comments)    Pt. States that it caused it to be suicidal.    Benadryl [Diphenhydramine Hcl (Sleep)] Other  (See Comments)    Makes the patient very "hyper"   Ezetimibe     Other reaction(s): Unknown Other reaction(s): Unknown   Gabapentin Other (See Comments)   Metformin Hcl     Other reaction(s): Unknown Other reaction(s): Unknown   Other     Other reaction(s): Unknown   Sitagliptin     Other reaction(s): Unknown Other reaction(s): Unknown   Tylenol [Acetaminophen] Nausea Only   Colesevelam Hcl Other (See Comments)    Causes dizziness Other reaction(s): Unknown    Family History  Problem Relation Age of Onset   Other Mother    Pneumonia Mother    Heart attack Father    Liver cancer Sister    Stroke Sister    Heart attack Brother 38   Stroke Son 11   Healthy Daughter     Prior to Admission medications   Medication Sig Start Date End Date Taking? Authorizing Provider  albuterol (PROVENTIL) (2.5 MG/3ML) 0.083% nebulizer solution Take 2.5 mg by nebulization every 6 (six) hours as needed. 12/28/21   [provider]  amLODipine (NORVASC) 10 MG tablet Take 10 mg by mouth daily.    [provider]  apixaban (ELIQUIS) 5 MG TABS tablet Take 1 tablet (5 mg total) by mouth 2 (two) times daily. 10/18/21   Zannie Cove, MD  Ascorbic Acid (VITAMIN C) 1000 MG tablet Take 1,000 mg by mouth daily.    [provider]  benazepril (LOTENSIN) 10 MG tablet Take 1 tablet (10 mg total) by mouth daily. 10/18/21   Zannie Cove, MD  bisacodyl (DULCOLAX) 10 MG suppository Place 10 mg rectally as needed for moderate constipation.    [provider]  carbidopa-levodopa (SINEMET IR) 25-100 MG tablet Take 1.5 tablets by mouth at bedtime. 12/16/20   Nita Sickle K, DO  cholecalciferol (VITAMIN D3) 25 MCG (1000 UNIT) tablet Take 1,000 Units by mouth daily.    [provider]  Continuous Blood Gluc Receiver (FREESTYLE LIBRE 2 READER) DEVI  03/02/22   [provider]  Continuous Blood Gluc Sensor (FREESTYLE LIBRE 2 SENSOR) MISC  03/01/22   [provider]  furosemide (LASIX) 40 MG tablet Take 1 tablet (40 mg total) by mouth daily. 10/18/21   Zannie Cove, MD  hydrALAZINE (APRESOLINE) 25 MG tablet TAKE 1 TABLET BY MOUTH THREE TIMES DAILY 11/22/20   [name], Deloris Ping, MD  insulin glargine (LANTUS SOLOSTAR) 100 UNIT/ML Solostar Pen Inject 28 Units into the skin daily.    [provider]  insulin lispro (HUMALOG) 100 UNIT/ML KwikPen Inject into the skin. INJECT 5 UNITS SUBCUTANEOUSLY BEFORE LUNCH AND BEFORE DINNER (PRIME PEN WITH 2 UNITS PRIOR TO EACH USE - PEN    [provider]  isosorbide mononitrate (IMDUR) 30 MG 24 hr tablet Take 1 tablet by mouth once daily 07/10/21   [name], Deloris Ping, MD  levothyroxine (SYNTHROID) 100 MCG tablet Take 100 mcg by mouth daily. 03/13/22   [provider]  levothyroxine (SYNTHROID) 112 MCG tablet Take 112 mcg by mouth daily before breakfast.    [provider]  Magnesium Hydroxide (MILK OF MAGNESIA PO) Take 30 mLs by mouth as needed.    [provider]  meclizine (ANTIVERT) 25 MG tablet Take 25 mg by mouth daily.    [provider]  metoprolol succinate (TOPROL-XL) 25 MG 24 hr tablet Take 1 tablet by mouth once daily 06/07/21   [name], Deloris Ping, MD  mupirocin ointment (BACTROBAN) 2 % 2 (two) times daily. TO LESION ON DORSAL RIGHT HAND 04/13/22   [provider]  nitroGLYCERIN (NITROSTAT) 0.4 MG SL tablet DISSOLVE ONE TABLET UNDER THE TONGUE EVERY 5 MINUTES AS NEEDED FOR CHEST PAIN. 04/19/21   [name], Deloris Ping, MD  NON FORMULARY Diet:Reg CCD HH/thin diet consistency    [provider]  NOVOLOG FLEXPEN 100 UNIT/ML FlexPen Inject into the skin. 03/01/22   [provider]  nystatin (MYCOSTATIN/NYSTOP) powder APPLY TOPICALLY TO BUTTOCKS AND GROIN FOLLOWED BY A THIN LAYER OF BARRIER CREAM TWICE A DAY    [provider]  ondansetron (ZOFRAN) 4 MG tablet Take 4 mg by mouth every 6 (six) hours as needed for nausea. 08/20/20    [provider]  oxyCODONE (ROXICODONE) 5 MG immediate release tablet Take 1 tablet (5 mg total) by mouth every 8 (eight) hours as needed for severe pain. 04/10/22   Medina-Vargas, Monina C, NP  Oyster Shell Calcium 500 MG TABS TAKE 1 TABLET BY MOUTH ONCE DAILY FOR SUPPLEMENT    [provider]  pregabalin (LYRICA) 50 MG capsule Take 1 capsule (50 mg total) by mouth 2 (two) times daily. 04/17/22   Medina-Vargas, Monina C, NP  Sodium Phosphates (RA SALINE ENEMA RE) Place 1 Dose rectally as needed.    [provider]  vitamin B-12 (CYANOCOBALAMIN) 1000 MCG tablet Take 1,000 mcg by mouth daily.    [provider]    Physical Exam: Vitals:   06/09/22 0615 06/09/22 0645 06/09/22 0715 06/09/22 0730  BP: (!) 147/64 (!) 138/49 (!) 156/51 (!) 142/49  Pulse: (!) 31 (!) 49 61 (!) 33  Resp: 14 17 15 17   Temp:      TempSrc:      SpO2: 98% 99% 98% 96%  Weight:      Height:       Constitutional: Elderly female currently no acute distress Eyes: PERRL, lids and conjunctivae normal ENMT: Mucous membranes are moist.  Hard of hearing. Neck: normal, supple, JVD Respiratory: Normal respiratory effort without wheezes or rhonchi appreciated.  Patient on room air able to talk in complete sentences. Cardiovascular: Irregular, irregular, and bradycardic. No significant lower extremity edema.  Abdomen: no tenderness, no masses palpated. No hepatosplenomegaly. Bowel sounds positive.  Musculoskeletal: no clubbing / cyanosis. No joint deformity upper and lower extremities. Good ROM, no contractures. Normal muscle tone.  Skin: no rashes, lesions, ulcers. No induration Neurologic: CN 2-12 grossly intact.  Able to move all extremities Psychiatric: Normal judgment and insight. Alert and oriented x 3. Normal mood.   Data Reviewed:   EKG concerning for atrial fibrillation at 52 bpm with RBBB and repeat check sinus bradycardia at 34 bpm with significantly prolonged PR interval.   Reviewed labs imaging and pertinent records as noted above in HPI.  Assessment and Plan: Bradycardia first-degree heart block RBBB Patient presented with bradycardia with heart rates documented as low as 25, first-degree heart block with PR interval , right bundle branch block, and blood  pressures at least once show MAP less than 65.  Cardiology consulted and evaluated patient and recommended discontinuation of metoprolol succinate 25 mg daily. -Admit to a telemetry bed -Discontinue metoprolol succinate -Appreciate cardiology consultative services  Paroxysmal atrial fibrillation on chronic anticoagulation Previously noted in December after ankle fracture. -Continue Eliquis  Acute kidney injury superimposed on chronic kidney disease 3a Creatinine 1.35 with BUN 33 on admission.  Baseline creatinine previously 1.  This is greater than 0.3 increased from baseline to suggest acute kidney injury.  Question if this is related to hypoperfusion in setting of patient's bradycardia. -Check urinalysis -Hold nephrotoxic agents -Recheck creatinine level in a.m.  Urinary tract infection Acute.  Patient reported having burning discomfort with urination over the last 2 days.  Urinalysis revealed large leukocytes, rare bacteria, and greater than 50 WBCs. -Check urine culture -Rocephin IV  Elevated troponin Chronic.  High-sensitivity troponins noted to be otherwise flat at 31. -Continue to monitor  Essential hypertension Diastolics of intermittently been low.  Home blood pressure regimen included amlodipine 10 mg daily, benazepril 10 mg daily, furosemide 40 mg daily, hydralazine 25 mg 3 times daily, and isosorbide mononitrate 30 mg daily -Hold benazepril and furosemide due to AKI -Continue amlodipine, hydralazine, and isosorbide mononitrate  Uncontrolled diabetes mellitus type 2, with long-term use of insulin Last hemoglobin A1c 8 on 04/16/2022.  Home insulin regimen includes NovoLog 5 units 2  times daily with meals and glargine 28 units daily. -Hypoglycemic protocols -Continue Humalog 5 units with lunch and dinner -CBGs before every meal with sensitive SSI -Adjust regimen as needed  Hypothyroidism Review of records notes that TSH previously had been 7.58 on 04/16/2022.  Question if patient hypothyroidism is not adequately controlled leading to current presentation. -Check TSH -Continue levothyroxine  DVT prophylaxis: Eliquis Advance Care Planning:   Code Status: Full Code   Consults: Cardiology  Family Communication: Daughter updated over the phone  Severity of Illness: The appropriate patient status for this patient is OBSERVATION. Observation status is judged to be reasonable and necessary in order to provide the required intensity of service to ensure the patient's safety. The patient's presenting symptoms, physical exam findings, and initial radiographic and laboratory data in the context of their medical condition is felt to place them at decreased risk for further clinical deterioration. Furthermore, it is anticipated that the patient will be medically stable for discharge from the hospital within 2 midnights of admission.   Author: Clydie Braun, MD 06/09/2022 7:56 AM  For on call review [email or web address].

## 2022-06-09 NOTE — Consult Note (Signed)
Cardiology Consultation:   Patient ID: Sierra Wright MRN: [id number]; DOB: 07-07-1934  Admit date: 06/09/2022 Date of Consult: 06/09/2022  PCP:  Gillis Santa, NP   [facility] Providers Cardiologist:  Kristeen Miss, MD        Patient Profile:   Sierra Wright is a 88 y.o. female with a hx of paroxysmal afib on eliquis, CAD s/p CABG 2006, HTN, HFpEF who is being seen 06/09/2022 for the evaluation of bradycardia at the request of Dr Marily Memos.  History of Present Illness:   [name] was brought from her nursing home to the ED due to bradycardia. She was asymptomatic at the time. Had been bradycardic in the past and metoprolol cut from 50 to 25 mg PO daily. No syncope, no significant lightheadedness. She has been wheelchair bound for months to years due to ankle and knee issues. No chest pain, dyspnea, palpitations. Review of EKG and telemetry here shows sinus rhythm in the 60s with very prolonged PR interval ~400 ms and RBBB. There are intermittent PACs with blocked QRS complexes because they fall just after the T wave. Sometimes these are occurring in a pattern of bigeminy, leading to profound bradycardia with a rate in the 30s. It is difficult to make out P waves on previous EKGs, but suspect was markedly prolonged in the past as well.   Past Medical History:  Diagnosis Date   Arthritis    Coronary artery disease 1999   CABG   Depression    Diabetes mellitus with vascular disease    type II; metformin and Amaryl   Diabetic neuropathy (HCC)    History of anemia as a child    HOH (hard of hearing)    Hypertension    Hypothyroidism    synthroid   PONV (postoperative nausea and vomiting)    Restless leg syndrome    S/P cardiac cath 08/13/18 stable CAD 08/14/2018   Shortness of breath 09/03/2012   ECHO- The LA is Moderately Dilated, mild mitral annular calcification. mild pulmonary hypertension, moderate concentric LV hypertrophy. Atrial septum is aneurysmal.  Aortic valve appears to be mildly sclerotic.EF 88%   Urinary incontinence    Weakness of both legs     Past Surgical History:  Procedure Laterality Date   ABDOMINAL HYSTERECTOMY     APPENDECTOMY     BACK SURGERY     CARDIAC CATHERIZATION  08/20/03   Widely patent bypass grafts. Normal left ventricular systolic function.   CARDIAC CATHETERIZATION     CHOLECYSTECTOMY     COLONOSCOPY     CORONARY ARTERY BYPASS GRAFT     EYE SURGERY     bilateral cataracts   LEFT HEART CATH AND CORONARY ANGIOGRAPHY N/A 08/13/2018   Procedure: LEFT HEART CATH AND CORONARY ANGIOGRAPHY;  Surgeon: Kathleene Hazel, MD;  Location: [facility] INVASIVE CV LAB;  Service: Cardiovascular;  Laterality: N/A;   LUMBAR LAMINECTOMY/DECOMPRESSION MICRODISCECTOMY N/A 07/26/2015   Procedure: BILATERAL Lumbar 3-4 SUBTOTAL HEMILAMINECTOMY WITH LATERAL RECESS DECOMPRESSION;  Surgeon: Kerrin Champagne, MD;  Location: [facility] OR;  Service: Orthopedics;  Laterality: N/A;   SHOULDER SURGERY     TUBAL LIGATION         Inpatient Medications: Scheduled Meds:  Continuous Infusions:  PRN Meds:   Allergies:    Allergies  Allergen Reactions   Duloxetine Hcl Nausea And Vomiting and Other (See Comments)   Statins Other (See Comments)    Causes myalgias Other reaction(s): Unknown   Amitriptyline Other (See Comments)  Pt. States that it caused it to be suicidal.    Benadryl [Diphenhydramine Hcl (Sleep)] Other (See Comments)    Makes the patient very "hyper"   Ezetimibe     Other reaction(s): Unknown Other reaction(s): Unknown   Gabapentin Other (See Comments)   Metformin Hcl     Other reaction(s): Unknown Other reaction(s): Unknown   Other     Other reaction(s): Unknown   Sitagliptin     Other reaction(s): Unknown Other reaction(s): Unknown   Tylenol [Acetaminophen] Nausea Only   Colesevelam Hcl Other (See Comments)    Causes dizziness Other reaction(s): Unknown    Social History:   Social History   Socioeconomic  History   Marital status: Married    Spouse name: Not on file   Number of children: Not on file   Years of education: Not on file   Highest education level: Not on file  Occupational History   Not on file  Tobacco Use   Smoking status: Never    Passive exposure: Never   Smokeless tobacco: Never  Vaping Use   Vaping Use: Never used  Substance and Sexual Activity   Alcohol use: No   Drug use: No   Sexual activity: Not Currently  Other Topics Concern   Not on file  Social History Narrative   Lives with husband in a 1 story home.  Has 1 daughter.   Education: high school.     Worked as a Futures trader.    Right Handed   Social Determinants of Health   Financial Resource Strain: Not on file  Food Insecurity: Not on file  Transportation Needs: Not on file  Physical Activity: Not on file  Stress: Not on file  Social Connections: Not on file  Intimate Partner Violence: Not on file    Family History:    Family History  Problem Relation Age of Onset   Other Mother    Pneumonia Mother    Heart attack Father    Liver cancer Sister    Stroke Sister    Heart attack Brother 65   Stroke Son 9   Healthy Daughter      ROS:  Please see the history of present illness.   All other ROS reviewed and negative.     Physical Exam/Data:   Vitals:   06/09/22 0245 06/09/22 0300 06/09/22 0315 06/09/22 0407  BP: 116/75 (!) 123/40 (!) 130/37 (!) 158/41  Pulse: (!) 38 (!) 25 88 (!) 54  Resp: (!) 22 (!) 22 16 19   Temp:      TempSrc:      SpO2: 98% 96% 97% 100%  Weight:      Height:       No intake or output data in the 24 hours ending 06/09/22 0441    06/09/2022    2:26 AM 04/24/2022    1:37 PM 04/17/2022   11:45 AM  Last 3 Weights  Weight (lbs) 227 lb 222 lb 220 lb 12.8 oz  Weight (kg) 102.967 kg 100.699 kg 100.154 kg     Body mass index is 37.77 kg/m.  General:  Well nourished, well developed, in no acute distress HEENT: normal Neck: no JVD Vascular: No carotid bruits;  Distal pulses 2+ bilaterally Cardiac:  normal S1, S2; Marked bradycardia, irregular. No murmur Lungs:  clear to auscultation bilaterally, no wheezing, rhonchi or rales  Abd: soft, nontender, no hepatomegaly  Ext: no edema Musculoskeletal:  No deformities, BUE and BLE strength normal and equal Skin: warm and  dry  Neuro:  CNs 2-12 intact, no focal abnormalities noted Psych:  Normal affect   Laboratory Data:  High Sensitivity Troponin:   Recent Labs  Lab 06/09/22 0249  TROPONINIHS 31*     Chemistry Recent Labs  Lab 06/09/22 0249  NA 139  K 3.8  CL 104  CO2 24  GLUCOSE 118*  BUN 33*  CREATININE 1.35*  CALCIUM 9.0  MG 1.9  GFRNONAA 38*  ANIONGAP 11    Recent Labs  Lab 06/09/22 0249  PROT 5.8*  ALBUMIN 3.2*  AST 20  ALT 7  ALKPHOS 80  BILITOT 0.6   Lipids No results for input(s): "CHOL", "TRIG", "HDL", "LABVLDL", "LDLCALC", "CHOLHDL" in the last 168 hours.  Hematology Recent Labs  Lab 06/09/22 0249  WBC 10.1  RBC 4.30  HGB 13.0  HCT 40.4  MCV 94.0  MCH 30.2  MCHC 32.2  RDW 14.1  PLT 200   Thyroid No results for input(s): "TSH", "FREET4" in the last 168 hours.  BNPNo results for input(s): "BNP", "PROBNP" in the last 168 hours.  DDimer No results for input(s): "DDIMER" in the last 168 hours.   Radiology/Studies:  DG Chest Portable 1 View  Result Date: 06/09/2022 CLINICAL DATA:  Difficulty breathing EXAM: PORTABLE CHEST 1 VIEW COMPARISON:  02/13/2022 FINDINGS: Cardiac shadow is enlarged but stable. Postsurgical changes are again seen. Aortic calcifications are noted. The lungs are well aerated bilaterally. Minimal scarring is again seen in the left base. No bony abnormality is seen. IMPRESSION: Mild scarring in the left base. Electronically Signed   By: Alcide Clever M.D.   On: 06/09/2022 02:55     Assessment and Plan:   Frequent dropped PACs resulting in marked bradycardia, markedly prolonged first degree AV block: This rhythm is not life threatening and  does not necessitate pacemaker placement. The patient has limited exertional capacity at baseline and is asymptomatic. Please stop home metoprolol. Okay to discharge from cardiac perspective Paroxysmal Afib: Hold metoprolol, continue eliquis. No RVR since 2016. Will need to monitor for recurrence of RVR off of BB CAD: Continue home cardiac medications with the exception of metoprolol   Risk Assessment/Risk Scores:                For questions or updates, please contact [facility] Please consult [email or web address] for contact info under    Signed, [place] Nikky Duba, MD  06/09/2022 4:41 AM

## 2022-06-09 NOTE — Progress Notes (Addendum)
Progress Note  Patient Name: Sierra Wright Date of Encounter: 06/09/2022  [facility] Cardiologist: Kristeen Miss, MD   Subjective   Currently resides in rehab since November for ankle fracture.  Admitted with low heart rate.  No symptoms from this.  Does have impressive first-degree block of over 400 ms.  Right bundle branch block.  No symptoms.  Heart rates are improved off metoprolol.  Inpatient Medications    Scheduled Meds:  insulin aspart  0-9 Units Subcutaneous TID WC   insulin aspart  5 Units Subcutaneous BID WC   [START ON 06/10/2022] pneumococcal 20-valent conjugate vaccine  0.5 mL Intramuscular Tomorrow-1000   sodium chloride flush  3 mL Intravenous Q12H   Continuous Infusions:  PRN Meds: acetaminophen **OR** acetaminophen, albuterol, ondansetron **OR** ondansetron (ZOFRAN) IV   Vital Signs    Vitals:   06/09/22 1130 06/09/22 1200 06/09/22 1235 06/09/22 1308  BP: (!) 160/66 (!) 156/56 (!) 175/58 (!) 150/58  Pulse: 62 62 66 66  Resp: 15 16 12    Temp:   98 F (36.7 C)   TempSrc:   Oral   SpO2: 99% 98% 99% 100%  Weight:      Height:       No intake or output data in the 24 hours ending 06/09/22 1526    06/09/2022    2:26 AM 04/24/2022    1:37 PM 04/17/2022   11:45 AM  Last 3 Weights  Weight (lbs) 227 lb 222 lb 220 lb 12.8 oz  Weight (kg) 102.967 kg 100.699 kg 100.154 kg      Telemetry    Sinus bradycardia with marked prolonged first degree AVB and frequent dropped PACs, HR improved to 60s   - Personally Reviewed  ECG    Sinus bradycardia with marked first degree AVB  - Personally Reviewed  Physical Exam   GEN: No acute distress.   Neck: No JVD Cardiac: RRR, no murmurs, rubs, or gallops.  Respiratory: Clear to auscultation bilaterally.on room air.  GI: Soft, nontender, non-distended  MS: No leg edema; No deformity. Neuro:  Nonfocal  Psych: Normal affect   Labs    High Sensitivity Troponin:   Recent Labs  Lab 06/09/22 0249  06/09/22 0602  TROPONINIHS 31* 31*     Chemistry Recent Labs  Lab 06/09/22 0249  NA 139  K 3.8  CL 104  CO2 24  GLUCOSE 118*  BUN 33*  CREATININE 1.35*  CALCIUM 9.0  MG 1.9  PROT 5.8*  ALBUMIN 3.2*  AST 20  ALT 7  ALKPHOS 80  BILITOT 0.6  GFRNONAA 38*  ANIONGAP 11    Lipids No results for input(s): "CHOL", "TRIG", "HDL", "LABVLDL", "LDLCALC", "CHOLHDL" in the last 168 hours.  Hematology Recent Labs  Lab 06/09/22 0249  WBC 10.1  RBC 4.30  HGB 13.0  HCT 40.4  MCV 94.0  MCH 30.2  MCHC 32.2  RDW 14.1  PLT 200   Thyroid  Recent Labs  Lab 06/09/22 0810  TSH 4.104    BNPNo results for input(s): "BNP", "PROBNP" in the last 168 hours.  DDimer No results for input(s): "DDIMER" in the last 168 hours.   Radiology    DG Chest Portable 1 View  Result Date: 06/09/2022 CLINICAL DATA:  Difficulty breathing EXAM: PORTABLE CHEST 1 VIEW COMPARISON:  02/13/2022 FINDINGS: Cardiac shadow is enlarged but stable. Postsurgical changes are again seen. Aortic calcifications are noted. The lungs are well aerated bilaterally. Minimal scarring is again seen in the left base.  No bony abnormality is seen. IMPRESSION: Mild scarring in the left base. Electronically Signed   By: Alcide Clever M.D.   On: 06/09/2022 02:55    Cardiac Studies   LHC from 08/13/18:  Ost RCA to Prox RCA lesion is 99% stenosed. Dist RCA lesion is 100% stenosed. SVG graft was visualized by angiography and is normal in caliber. Prox Cx to Mid Cx lesion is 100% stenosed. Ost 2nd Mrg to 2nd Mrg lesion is 99% stenosed. SVG graft was visualized by angiography. Origin lesion is 100% stenosed. Ost LAD to Prox LAD lesion is 99% stenosed. Prox LAD lesion is 100% stenosed. Ost 1st Diag to 1st Diag lesion is 99% stenosed. LIMA graft was visualized by angiography and is normal in caliber.   1. Severe triple vessel CAD s/p 3V CABG with 2/3 patent bypass grafts 2. Severe stenosis in the proximal LAD followed by total  occlusion in the mid LAD. The LIMA is patent to mid LAD.  3. 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 The obtuse marginal branch fills from left to left collaterals. The vein graft to the obtuse marginal branch is chronically occluded.  4. The RCA has a severe ostial stenosis followed by total occlusion of the mid vessel. The vein graft to the distal RCA is patent.  5. Elevated left ventricular filling pressure   Recommendations: No targets for PCI. The native vessels are chronically occluded. The grafts to the LAD and RCA are patent. The native circumflex and OM branch are small and fill from left to left collaterals. Continue medical management of CAD. Findings discussed with Dr. Rennis Golden who is her rounding cardiologist.     Patient Profile     88 y.o. female with PMH of CAD s/p CABG (1999), paroxysmal A-fib, HTN, DMT2, RBBB, chronic diastolic CHF, hypothyroidism, CVA (07/2016) who was admitted on 06/09/2022 for bradycardia from facility.  Assessment & Plan    #Sinus bradycardia #First-degree AV block, greater than 400 ms #Right bundle branch block -Admitted with asymptomatic bradycardia.  Clearly has evidence of conduction disease with right bundle branch block and profound first-degree AV block.  Was on metoprolol.  Heart rates are much improved. -She currently lives in a facility.  She apparently had ankle fracture surgery and has really never recovered.  She is now living in a skilled nursing facility as she failed to progress with rehab.  Overall suspect she is not a great candidate for pacemaker.  She is really asymptomatic.  Given her poor level of activity would recommend to monitor for now. -TSH is normal.  We will recheck an echo tomorrow.  No infectious symptoms. -Suspect discharge tomorrow with monitor.  #Paroxysmal atrial fibrillation -Diagnosed with this in the emergency room in November 2022 at the time of her fracture surgery.  No recurrence.  Continue Eliquis.  No  bleeding issues.  For questions or updates, please contact [facility] Please consult [email or web address] for contact info under   [employer] Flora Lipps, MD, [facility]  [facility]  [address] [place], [place] [address] [phone]  3:58 PM

## 2022-06-10 ENCOUNTER — Observation Stay (HOSPITAL_COMMUNITY): Payer: Medicare Other

## 2022-06-10 DIAGNOSIS — R1084 Generalized abdominal pain: Secondary | ICD-10-CM | POA: Diagnosis not present

## 2022-06-10 DIAGNOSIS — E1165 Type 2 diabetes mellitus with hyperglycemia: Secondary | ICD-10-CM | POA: Diagnosis present

## 2022-06-10 DIAGNOSIS — Z951 Presence of aortocoronary bypass graft: Secondary | ICD-10-CM | POA: Diagnosis not present

## 2022-06-10 DIAGNOSIS — E1122 Type 2 diabetes mellitus with diabetic chronic kidney disease: Secondary | ICD-10-CM | POA: Diagnosis present

## 2022-06-10 DIAGNOSIS — Z79899 Other long term (current) drug therapy: Secondary | ICD-10-CM | POA: Diagnosis not present

## 2022-06-10 DIAGNOSIS — I5032 Chronic diastolic (congestive) heart failure: Secondary | ICD-10-CM | POA: Diagnosis present

## 2022-06-10 DIAGNOSIS — N179 Acute kidney failure, unspecified: Secondary | ICD-10-CM | POA: Diagnosis present

## 2022-06-10 DIAGNOSIS — Z8249 Family history of ischemic heart disease and other diseases of the circulatory system: Secondary | ICD-10-CM | POA: Diagnosis not present

## 2022-06-10 DIAGNOSIS — N390 Urinary tract infection, site not specified: Secondary | ICD-10-CM | POA: Diagnosis present

## 2022-06-10 DIAGNOSIS — I10 Essential (primary) hypertension: Secondary | ICD-10-CM | POA: Diagnosis not present

## 2022-06-10 DIAGNOSIS — I480 Paroxysmal atrial fibrillation: Secondary | ICD-10-CM | POA: Diagnosis present

## 2022-06-10 DIAGNOSIS — Z886 Allergy status to analgesic agent status: Secondary | ICD-10-CM | POA: Diagnosis not present

## 2022-06-10 DIAGNOSIS — N1831 Chronic kidney disease, stage 3a: Secondary | ICD-10-CM | POA: Diagnosis present

## 2022-06-10 DIAGNOSIS — Z6838 Body mass index (BMI) 38.0-38.9, adult: Secondary | ICD-10-CM | POA: Diagnosis not present

## 2022-06-10 DIAGNOSIS — Z888 Allergy status to other drugs, medicaments and biological substances status: Secondary | ICD-10-CM | POA: Diagnosis not present

## 2022-06-10 DIAGNOSIS — R001 Bradycardia, unspecified: Secondary | ICD-10-CM | POA: Diagnosis present

## 2022-06-10 DIAGNOSIS — I441 Atrioventricular block, second degree: Secondary | ICD-10-CM | POA: Diagnosis present

## 2022-06-10 DIAGNOSIS — I130 Hypertensive heart and chronic kidney disease with heart failure and stage 1 through stage 4 chronic kidney disease, or unspecified chronic kidney disease: Secondary | ICD-10-CM | POA: Diagnosis present

## 2022-06-10 DIAGNOSIS — R627 Adult failure to thrive: Secondary | ICD-10-CM | POA: Diagnosis present

## 2022-06-10 DIAGNOSIS — I2510 Atherosclerotic heart disease of native coronary artery without angina pectoris: Secondary | ICD-10-CM | POA: Diagnosis present

## 2022-06-10 DIAGNOSIS — Z993 Dependence on wheelchair: Secondary | ICD-10-CM | POA: Diagnosis not present

## 2022-06-10 DIAGNOSIS — B961 Klebsiella pneumoniae [K. pneumoniae] as the cause of diseases classified elsewhere: Secondary | ICD-10-CM | POA: Diagnosis present

## 2022-06-10 DIAGNOSIS — E1140 Type 2 diabetes mellitus with diabetic neuropathy, unspecified: Secondary | ICD-10-CM | POA: Diagnosis present

## 2022-06-10 DIAGNOSIS — E039 Hypothyroidism, unspecified: Secondary | ICD-10-CM | POA: Diagnosis present

## 2022-06-10 DIAGNOSIS — E669 Obesity, unspecified: Secondary | ICD-10-CM | POA: Diagnosis present

## 2022-06-10 DIAGNOSIS — Z1611 Resistance to penicillins: Secondary | ICD-10-CM | POA: Diagnosis present

## 2022-06-10 DIAGNOSIS — K5900 Constipation, unspecified: Secondary | ICD-10-CM | POA: Diagnosis present

## 2022-06-10 LAB — ECHOCARDIOGRAM COMPLETE
Area-P 1/2: 4.72 cm2
Height: 65 in
S' Lateral: 3 cm
Weight: 3615.54 oz

## 2022-06-10 LAB — CBC
HCT: 38.3 % (ref 36.0–46.0)
Hemoglobin: 12.6 g/dL (ref 12.0–15.0)
MCH: 30.1 pg (ref 26.0–34.0)
MCHC: 32.9 g/dL (ref 30.0–36.0)
MCV: 91.6 fL (ref 80.0–100.0)
Platelets: 189 10*3/uL (ref 150–400)
RBC: 4.18 MIL/uL (ref 3.87–5.11)
RDW: 13.8 % (ref 11.5–15.5)
WBC: 9.5 10*3/uL (ref 4.0–10.5)
nRBC: 0 % (ref 0.0–0.2)

## 2022-06-10 LAB — BASIC METABOLIC PANEL
Anion gap: 10 (ref 5–15)
BUN: 25 mg/dL — ABNORMAL HIGH (ref 8–23)
CO2: 25 mmol/L (ref 22–32)
Calcium: 8.5 mg/dL — ABNORMAL LOW (ref 8.9–10.3)
Chloride: 102 mmol/L (ref 98–111)
Creatinine, Ser: 1.14 mg/dL — ABNORMAL HIGH (ref 0.44–1.00)
GFR, Estimated: 47 mL/min — ABNORMAL LOW (ref >60)
Glucose, Bld: 157 mg/dL — ABNORMAL HIGH (ref 70–99)
Potassium: 4 mmol/L (ref 3.5–5.1)
Sodium: 137 mmol/L (ref 135–145)

## 2022-06-10 LAB — GLUCOSE, CAPILLARY
Glucose-Capillary: 167 mg/dL — ABNORMAL HIGH (ref 70–99)
Glucose-Capillary: 216 mg/dL — ABNORMAL HIGH (ref 70–99)
Glucose-Capillary: 203 mg/dL — ABNORMAL HIGH (ref 70–99)
Glucose-Capillary: 103 mg/dL — ABNORMAL HIGH (ref 70–99)

## 2022-06-10 LAB — MAGNESIUM: Magnesium: 1.9 mg/dL (ref 1.7–2.4)

## 2022-06-10 MED ORDER — PERFLUTREN LIPID MICROSPHERE
1.0000 mL | INTRAVENOUS | Status: AC | PRN
  Administered 2022-06-10: 2 mL via INTRAVENOUS

## 2022-06-10 MED ORDER — SENNOSIDES-DOCUSATE SODIUM 8.6-50 MG PO TABS
1.0000 | ORAL_TABLET | Freq: Two times a day (BID) | ORAL | Status: DC
  Filled 2022-06-10: qty 1

## 2022-06-10 MED ORDER — POLYETHYLENE GLYCOL 3350 17 G PO PACK
17.0000 g | PACK | Freq: Every day | ORAL | Status: DC
  Filled 2022-06-10 (×2): qty 1

## 2022-06-10 NOTE — Progress Notes (Signed)
Cardiology Progress Note  Patient ID: Sierra Wright MRN: [id number] DOB: Apr 30, 1934 Date of Encounter: 06/10/2022  Primary Cardiologist: Kristeen Miss, MD  Subjective   Chief Complaint: none.   HPI: Evidence of 2-1 block overnight.  EP evaluation pending.  No symptoms.  ROS:  All other ROS reviewed and negative. Pertinent positives noted in the HPI.     Inpatient Medications  Scheduled Meds:  amLODipine  10 mg Oral Daily   apixaban  5 mg Oral BID   carbidopa-levodopa  1.5 tablet Oral QHS   hydrALAZINE  25 mg Oral TID   insulin aspart  0-9 Units Subcutaneous TID WC   insulin aspart  5 Units Subcutaneous BID WC   insulin glargine-yfgn  20 Units Subcutaneous Daily   isosorbide mononitrate  30 mg Oral Daily   levothyroxine  112 mcg Oral QAC breakfast   meclizine  25 mg Oral Daily   mupirocin ointment  1 Application Topical BID   nystatin  1 Application Topical BID   pneumococcal 20-valent conjugate vaccine  0.5 mL Intramuscular Tomorrow-1000   pregabalin  50 mg Oral BID   saccharomyces boulardii  250 mg Oral Daily   sodium chloride flush  3 mL Intravenous Q12H   Continuous Infusions:  cefTRIAXone (ROCEPHIN)  IV 1 g (06/09/22 1954)   PRN Meds: acetaminophen **OR** acetaminophen, albuterol, bisacodyl, ondansetron **OR** ondansetron (ZOFRAN) IV, oxyCODONE   Vital Signs   Vitals:   06/10/22 0443 06/10/22 0446 06/10/22 0700 06/10/22 0751  BP: (!) 153/53  (!) 133/55 (!) 133/55  Pulse:   (!) 48 79  Resp: 16  18   Temp: 98.3 F (36.8 C)  97.7 F (36.5 C)   TempSrc: Oral  Oral   SpO2: 95%  98% 97%  Weight:  102.5 kg    Height:        Intake/Output Summary (Last 24 hours) at 06/10/2022 1119 Last data filed at 06/10/2022 0916 Gross per 24 hour  Intake 463 ml  Output 650 ml  Net -187 ml      06/10/2022    4:46 AM 06/09/2022    2:26 AM 04/24/2022    1:37 PM  Last 3 Weights  Weight (lbs) 225 lb 15.5 oz 227 lb 222 lb  Weight (kg) 102.5 kg 102.967 kg 100.699 kg       Telemetry  Overnight telemetry shows sinus bradycardia with prolonged first-degree block, evidence of 2-1 block overnight, which I personally reviewed.   Physical Exam   Vitals:   06/10/22 0443 06/10/22 0446 06/10/22 0700 06/10/22 0751  BP: (!) 153/53  (!) 133/55 (!) 133/55  Pulse:   (!) 48 79  Resp: 16  18   Temp: 98.3 F (36.8 C)  97.7 F (36.5 C)   TempSrc: Oral  Oral   SpO2: 95%  98% 97%  Weight:  102.5 kg    Height:        Intake/Output Summary (Last 24 hours) at 06/10/2022 1119 Last data filed at 06/10/2022 0916 Gross per 24 hour  Intake 463 ml  Output 650 ml  Net -187 ml       06/10/2022    4:46 AM 06/09/2022    2:26 AM 04/24/2022    1:37 PM  Last 3 Weights  Weight (lbs) 225 lb 15.5 oz 227 lb 222 lb  Weight (kg) 102.5 kg 102.967 kg 100.699 kg    Body mass index is 37.6 kg/m.  General: Well nourished, well developed, in no acute distress Head: Atraumatic, normal size  Eyes: PEERLA, EOMI  Neck: Supple, no JVD Endocrine: No thryomegaly Cardiac: Normal S1, S2; RRR; no murmurs, rubs, or gallops Lungs: Clear to auscultation bilaterally, no wheezing, rhonchi or rales  Abd: Soft, nontender, no hepatomegaly  Ext: No edema, pulses 2+ Musculoskeletal: No deformities, BUE and BLE strength normal and equal Skin: Warm and dry, no rashes   Neuro: Alert and oriented to person, place, time, and situation, CNII-XII grossly intact, no focal deficits  Psych: Normal mood and affect   Labs  High Sensitivity Troponin:   Recent Labs  Lab 06/09/22 0249 06/09/22 0602  TROPONINIHS 31* 31*     Cardiac EnzymesNo results for input(s): "TROPONINI" in the last 168 hours. No results for input(s): "TROPIPOC" in the last 168 hours.  Chemistry Recent Labs  Lab 06/09/22 0249 06/10/22 0108  NA 139 137  K 3.8 4.0  CL 104 102  CO2 24 25  GLUCOSE 118* 157*  BUN 33* 25*  CREATININE 1.35* 1.14*  CALCIUM 9.0 8.5*  PROT 5.8*  --   ALBUMIN 3.2*  --   AST 20  --   ALT 7  --   ALKPHOS 80   --   BILITOT 0.6  --   GFRNONAA 38* 47*  ANIONGAP 11 10    Hematology Recent Labs  Lab 06/09/22 0249 06/10/22 0108  WBC 10.1 9.5  RBC 4.30 4.18  HGB 13.0 12.6  HCT 40.4 38.3  MCV 94.0 91.6  MCH 30.2 30.1  MCHC 32.2 32.9  RDW 14.1 13.8  PLT 200 189   BNPNo results for input(s): "BNP", "PROBNP" in the last 168 hours.  DDimer No results for input(s): "DDIMER" in the last 168 hours.   Radiology  DG Chest Portable 1 View  Result Date: 06/09/2022 CLINICAL DATA:  Difficulty breathing EXAM: PORTABLE CHEST 1 VIEW COMPARISON:  02/13/2022 FINDINGS: Cardiac shadow is enlarged but stable. Postsurgical changes are again seen. Aortic calcifications are noted. The lungs are well aerated bilaterally. Minimal scarring is again seen in the left base. No bony abnormality is seen. IMPRESSION: Mild scarring in the left base. Electronically Signed   By: Alcide Clever M.D.   On: 06/09/2022 02:55    Cardiac Studies  Echo pending  Patient Profile  88 y.o. female with PMH of CAD s/p CABG (1999), paroxysmal A-fib, HTN, DMT2, RBBB, chronic diastolic CHF, hypothyroidism, CVA (07/2016) who was admitted on 06/09/2022 for bradycardia from facility.  Assessment & Plan   #Sinus bradycardia #First-degree block over 400 ms #Right bundle branch block #Evidence of 2-1 block -Admitted with asymptomatic bradycardia.  Has clear evidence of conduction disease with first-degree block over 400 ms as well as right bundle branch block.  She was on metoprolol.  This has been stopped. -Telemetry overnight shows evidence of 2-1 block.  I discussed her case with the EP.  They will evaluate her. -TSH is normal.  Echo is pending. -Currently she is basically bedridden due to ankle surgery.  She reports low energy and cannot do things.  I wonder if bradycardia has been contributing to her inability include participate with therapy.  We will have electrophysiology weigh in.  She may not be a candidate for pacemaker regardless.  She  does tell me that she is quite uninterested in a pacemaker even if it was offered to her.  I believe she should be evaluated by EP first and then have this discussion. -Discussed her case with hospital medicine.  If she refuses pacemaker and continues to have low heart rates  palliative care would be indicated.  #Paroxysmal atrial fibrillation -No recurrence.  Continue Eliquis.  For questions or updates, please contact [facility] Please consult [email or web address] for contact info under   Signed, Gerri Spore T. Flora Lipps, MD, [facility]  [facility]  06/10/2022 11:19 AM

## 2022-06-10 NOTE — Progress Notes (Addendum)
PROGRESS NOTE    Sierra Wright  KXF:[id number] DOB: 06/02/1934 DOA: 06/09/2022 PCP: No primary care provider on file.     Brief Narrative:   H/o IDDM2, hypothyroidism, RLS , CVA, CAD s/p CABG, HFpEF, PAF on Eliquis, who presents due to slow heart rates  Subjective:  Denies pain, no fever, no sob  Assessment & Plan:  Principal Problem:   Bradycardia Active Problems:   Paroxysmal atrial fibrillation (HCC)   Acute kidney injury superimposed on chronic kidney disease (HCC)   Acute lower UTI   Elevated troponin   Essential hypertension   Type 2 diabetes mellitus with hemoglobin A1c goal of less than 7.5% (HCC)   Hypothyroidism   Afib, present with bradycardia hold metoprolol Echo pending Seen by cardiology /EP Will follow cards recommendation  symptomatic UTI/AKI  on CKD IIIa Urine culture in process Continue Rocephin  Insulin-dependent type 2 diabetes Blood sugar stable on current regimen  Hypothyroidism TSH 4 Continue Synthroid  RN reports patient has diarrhea, kub showed solid stool in bowel, likely overflow diarrhea in the setting on constipation, need to treat constipation.     FTT, wheelchair to bedbound due to bilateral ankle fractures found in 11/202, nonsurgical management recommended at the time.   The patient's BMI is: Body mass index is 37.6 kg/m..meet obesity criteria      I have Reviewed nursing notes, Vitals, pain scores, I/o's, Lab results and  imaging results since pt's last encounter, details please see discussion above  I ordered the following labs:  Unresulted Labs (From admission, onward)     Start     Ordered   06/11/22 0500  Basic metabolic panel  Tomorrow morning,   R       Question:  Specimen collection method  Answer:  Lab=Lab collect   06/10/22 1445   06/10/22 1053  C Difficile Quick Screen w PCR reflex  (C Difficile quick screen w PCR reflex panel )  Once, for 24 hours,   TIMED       References:    CDiff Information Tool    06/10/22 1052             DVT prophylaxis:  apixaban (ELIQUIS) tablet 5 mg   Code Status:   Code Status: Full Code  Family Communication: patient Disposition:    Dispo: The patient is from:SNF              Anticipated d/c is to: SNF              Anticipated d/c date is: return to snf once urine culture resulted   Antimicrobials:    Anti-infectives (From admission, onward)    Start     Dose/Rate Route Frequency Ordered Stop   06/09/22 2000  cefTRIAXone (ROCEPHIN) 1 g in sodium chloride 0.9 % 100 mL IVPB        1 g 200 mL/hr over 30 Minutes Intravenous Every 24 hours 06/09/22 1849            Objective: Vitals:   06/10/22 1248 06/10/22 1545 06/10/22 1724 06/10/22 1942  BP: 129/60 (!) 107/54 (!) 118/50 (!) 133/39  Pulse: (!) 40 63  (!) 47  Resp: 19 18  19   Temp: 97.8 F (36.6 C) (!) 97.5 F (36.4 C)  98.4 F (36.9 C)  TempSrc: Oral Oral  Oral  SpO2: 98% 98%  97%  Weight:      Height:        Intake/Output Summary (Last 24 hours) at 06/10/2022  2310 Last data filed at 06/10/2022 0916 Gross per 24 hour  Intake 823 ml  Output --  Net 823 ml   Filed Weights   06/09/22 0226 06/10/22 0446  Weight: 103 kg 102.5 kg    Examination:  General exam: alert, awake, communicative,calm, NAD Respiratory system: Clear to auscultation. Respiratory effort normal. Cardiovascular system:  RRR.  Gastrointestinal system: Abdomen is nondistended, soft and nontender.  Normal bowel sounds heard. Central nervous system: Alert and oriented. No focal neurological deficits. Extremities:  no edema Skin: No rashes, lesions or ulcers Psychiatry: Judgement and insight appear normal. Mood & affect appropriate.     Data Reviewed: I have personally reviewed  labs and visualized  imaging studies since the last encounter and formulate the plan        Scheduled Meds:  amLODipine  10 mg Oral Daily   apixaban  5 mg Oral BID   carbidopa-levodopa  1.5 tablet Oral QHS   hydrALAZINE   25 mg Oral TID   insulin aspart  0-9 Units Subcutaneous TID WC   insulin aspart  5 Units Subcutaneous BID WC   insulin glargine-yfgn  20 Units Subcutaneous Daily   isosorbide mononitrate  30 mg Oral Daily   levothyroxine  112 mcg Oral QAC breakfast   meclizine  25 mg Oral Daily   mupirocin ointment  1 Application Topical BID   nystatin  1 Application Topical BID   pneumococcal 20-valent conjugate vaccine  0.5 mL Intramuscular Tomorrow-1000   pregabalin  50 mg Oral BID   saccharomyces boulardii  250 mg Oral Daily   sodium chloride flush  3 mL Intravenous Q12H   Continuous Infusions:  cefTRIAXone (ROCEPHIN)  IV 1 g (06/10/22 2149)     LOS: 0 days      Albertine Grates, MD PhD FACP [facility]  Available via Epic secure chat 7am-7pm for nonurgent issues Please page for urgent issues To page the attending provider between 7A-7P or the covering provider during after hours 7P-7A, please log into the web site [email or web address] and access using universal [place] password for that web site. If you do not have the password, please call the hospital operator.    06/10/2022, 11:10 PM

## 2022-06-10 NOTE — Consult Note (Signed)
Cardiology Consultation:   Patient ID: Sierra Wright MRN: [id number]; DOB: 1933/12/10  Admit date: 06/09/2022 Date of Consult: 06/10/2022  PCP:  No primary care provider on file.   [facility] Providers Cardiologist:  Kristeen Miss, MD        Patient Profile:   Sierra Wright is a 88 y.o. female with a hx of paroxysmal atrial fibrillation, coronary artery disease status post CABG, diastolic heart failure, diabetes who is being seen 06/10/2022 for the evaluation of bradycardia at the request of [place].  History of Present Illness:   Sierra Wright has been at rehab.  She was found to be bradycardic.  While at rehab, she is mostly in the wheelchair and in the bed, not doing much activity.  She had had a burning discomfort for the last 2 days.  On admission to the emergency room, heart rates were in the 20s to 80s.  She was on metoprolol, heart but heart rates are much improved.  She is now in Mobitz 1 AV block with intermittent 2-1 AV block, though she was likely sleeping during this episode.   Past Medical History:  Diagnosis Date   Arthritis    Coronary artery disease 1999   CABG   Depression    Diabetes mellitus with vascular disease    type II; metformin and Amaryl   Diabetic neuropathy (HCC)    History of anemia as a child    HOH (hard of hearing)    Hypertension    Hypothyroidism    synthroid   PONV (postoperative nausea and vomiting)    Restless leg syndrome    S/P cardiac cath 08/13/18 stable CAD 08/14/2018   Shortness of breath 09/03/2012   ECHO- The LA is Moderately Dilated, mild mitral annular calcification. mild pulmonary hypertension, moderate concentric LV hypertrophy. Atrial septum is aneurysmal. Aortic valve appears to be mildly sclerotic.EF 88%   Urinary incontinence    Weakness of both legs     Past Surgical History:  Procedure Laterality Date   ABDOMINAL HYSTERECTOMY     APPENDECTOMY     BACK SURGERY     CARDIAC CATHERIZATION  08/20/03   Widely  patent bypass grafts. Normal left ventricular systolic function.   CARDIAC CATHETERIZATION     CHOLECYSTECTOMY     COLONOSCOPY     CORONARY ARTERY BYPASS GRAFT     EYE SURGERY     bilateral cataracts   LEFT HEART CATH AND CORONARY ANGIOGRAPHY N/A 08/13/2018   Procedure: LEFT HEART CATH AND CORONARY ANGIOGRAPHY;  Surgeon: Kathleene Hazel, MD;  Location: [facility];  Service: Cardiovascular;  Laterality: N/A;   LUMBAR LAMINECTOMY/DECOMPRESSION MICRODISCECTOMY N/A 07/26/2015   Procedure: BILATERAL Lumbar 3-4 SUBTOTAL HEMILAMINECTOMY WITH LATERAL RECESS DECOMPRESSION;  Surgeon: Kerrin Champagne, MD;  Location: [facility] OR;  Service: Orthopedics;  Laterality: N/A;   SHOULDER SURGERY     TUBAL LIGATION       Home Medications:  Prior to Admission medications   Medication Sig Start Date End Date Taking? Authorizing Provider  albuterol (PROVENTIL) (2.5 MG/3ML) 0.083% nebulizer solution Take 2.5 mg by nebulization every 6 (six) hours as needed for wheezing or shortness of breath. 12/28/21  Yes [provider]  amLODipine (NORVASC) 10 MG tablet Take 10 mg by mouth daily.   Yes [provider]  apixaban (ELIQUIS) 5 MG TABS tablet Take 1 tablet (5 mg total) by mouth 2 (two) times daily. 10/18/21  Yes Zannie Cove, MD  Ascorbic Acid (VITAMIN C)  1000 MG tablet Take 1,000 mg by mouth daily.   Yes [provider]  benazepril (LOTENSIN) 10 MG tablet Take 1 tablet (10 mg total) by mouth daily. 10/18/21  Yes Zannie Cove, MD  bisacodyl (DULCOLAX) 10 MG suppository Place 10 mg rectally daily as needed for moderate constipation (if not relieved by MOM).   Yes [provider]  carbidopa-levodopa (SINEMET IR) 25-100 MG tablet Take 1.5 tablets by mouth at bedtime. 12/16/20  Yes Patel, Donika K, DO  cholecalciferol (VITAMIN D3) 25 MCG (1000 UNIT) tablet Take 1,000 Units by mouth daily.   Yes [provider]  furosemide (LASIX) 40 MG tablet Take 1 tablet (40 mg  total) by mouth daily. 10/18/21  Yes Zannie Cove, MD  hydrALAZINE (APRESOLINE) 25 MG tablet TAKE 1 TABLET BY MOUTH THREE TIMES DAILY Patient taking differently: Take 25 mg by mouth 3 (three) times daily. 11/22/20  Yes [name], Deloris Ping, MD  insulin glargine (LANTUS SOLOSTAR) 100 UNIT/ML Solostar Pen Inject 28 Units into the skin daily.   Yes [provider]  insulin lispro (HUMALOG) 100 UNIT/ML KwikPen Inject 5 Units into the skin 2 (two) times daily. Before lunch and dinner   Yes [provider]  isosorbide mononitrate (IMDUR) 30 MG 24 hr tablet Take 1 tablet by mouth once daily Patient taking differently: Take 30 mg by mouth daily. 07/10/21  Yes [name], Deloris Ping, MD  levothyroxine (SYNTHROID) 112 MCG tablet Take 112 mcg by mouth daily before breakfast.   Yes [provider]  Magnesium Hydroxide (MILK OF MAGNESIA PO) Take 30 mLs by mouth daily as needed (constipation).   Yes [provider]  meclizine (ANTIVERT) 25 MG tablet Take 25 mg by mouth daily.   Yes [provider]  mupirocin ointment (BACTROBAN) 2 % Apply 1 Application topically 2 (two) times daily. TO LESION ON DORSAL RIGHT HAND 04/13/22  Yes [provider]  nitroGLYCERIN (NITROSTAT) 0.4 MG SL tablet DISSOLVE ONE TABLET UNDER THE TONGUE EVERY 5 MINUTES AS NEEDED FOR CHEST PAIN. Patient taking differently: Place 0.4 mg under the tongue every 5 (five) minutes as needed for chest pain. 04/19/21  Yes [name], Deloris Ping, MD  NON FORMULARY Apply 1 Application topically 2 (two) times daily. To back and other affected area - Anti-Itch Lotion 0.5%-0.5%   Yes [provider]  nystatin [facility]) powder Apply 1 Application topically 2 (two) times daily. Apply to buttocks and groin followed by a thin layer of barrier cream.   Yes [provider]  ondansetron (ZOFRAN) 4 MG tablet Take 4 mg by mouth every 6 (six) hours as needed for nausea. 08/20/20  Yes [provider]   oxyCODONE (ROXICODONE) 5 MG immediate release tablet Take 1 tablet (5 mg total) by mouth every 8 (eight) hours as needed for severe pain. 04/10/22  Yes Medina-Vargas, Monina C, NP  Oyster Shell Calcium 500 MG TABS Take 500 mg by mouth daily. TAKE 1 TABLET BY MOUTH ONCE DAILY FOR SUPPLEMENT   Yes [provider]  pregabalin (LYRICA) 50 MG capsule Take 1 capsule (50 mg total) by mouth 2 (two) times daily. 04/17/22  Yes Medina-Vargas, Monina C, NP  saccharomyces boulardii (FLORASTOR) 250 MG capsule Take 250 mg by mouth daily.   Yes [provider]  Sodium Phosphates (RA SALINE ENEMA RE) Place 1 Dose rectally daily as needed (constipation not relieved by bisacodyl).   Yes [provider]  vitamin B-12 (CYANOCOBALAMIN) 1000 MCG tablet Take 1,000 mcg by mouth daily.  Yes [provider]  Continuous Blood Gluc Receiver (FREESTYLE LIBRE 2 READER) DEVI  03/02/22   [provider]  Continuous Blood Gluc Sensor (FREESTYLE LIBRE 2 SENSOR) MISC  03/01/22   [provider]  nystatin (MYCOSTATIN/NYSTOP) powder APPLY TOPICALLY TO BUTTOCKS AND GROIN FOLLOWED BY A THIN LAYER OF BARRIER CREAM TWICE A DAY    [provider]    Inpatient Medications: Scheduled Meds:  amLODipine  10 mg Oral Daily   apixaban  5 mg Oral BID   carbidopa-levodopa  1.5 tablet Oral QHS   hydrALAZINE  25 mg Oral TID   insulin aspart  0-9 Units Subcutaneous TID WC   insulin aspart  5 Units Subcutaneous BID WC   insulin glargine-yfgn  20 Units Subcutaneous Daily   isosorbide mononitrate  30 mg Oral Daily   levothyroxine  112 mcg Oral QAC breakfast   meclizine  25 mg Oral Daily   mupirocin ointment  1 Application Topical BID   nystatin  1 Application Topical BID   pneumococcal 20-valent conjugate vaccine  0.5 mL Intramuscular Tomorrow-1000   pregabalin  50 mg Oral BID   saccharomyces boulardii  250 mg Oral Daily   sodium chloride flush  3 mL Intravenous Q12H   Continuous  Infusions:  cefTRIAXone (ROCEPHIN)  IV 1 g (06/09/22 1954)   PRN Meds: acetaminophen **OR** acetaminophen, albuterol, bisacodyl, ondansetron **OR** ondansetron (ZOFRAN) IV, oxyCODONE  Allergies:    Allergies  Allergen Reactions   Duloxetine Hcl Nausea And Vomiting and Other (See Comments)   Statins Other (See Comments)    Causes myalgias Other reaction(s): Unknown   Amitriptyline Other (See Comments)    Pt. States that it caused it to be suicidal.    Benadryl [Diphenhydramine Hcl (Sleep)] Other (See Comments)    Makes the patient very "hyper"   Ezetimibe     Other reaction(s): Unknown    Gabapentin Other (See Comments)   Metformin Hcl     Other reaction(s): Unknown   Other     Other reaction(s): Unknown   Sitagliptin     Other reaction(s): Unknown    Tylenol [Acetaminophen] Nausea Only   Colesevelam Hcl Other (See Comments)    Causes dizziness Other reaction(s): Unknown    Social History:   Social History   Socioeconomic History   Marital status: Married    Spouse name: Not on file   Number of children: Not on file   Years of education: Not on file   Highest education level: Not on file  Occupational History   Not on file  Tobacco Use   Smoking status: Never    Passive exposure: Never   Smokeless tobacco: Never  Vaping Use   Vaping Use: Never used  Substance and Sexual Activity   Alcohol use: No   Drug use: No   Sexual activity: Not Currently  Other Topics Concern   Not on file  Social History Narrative   Lives with husband in a 1 story home.  Has 1 daughter.   Education: high school.     Worked as a Futures trader.    Right Handed   Social Determinants of Health   Financial Resource Strain: Not on file  Food Insecurity: Not on file  Transportation Needs: Not on file  Physical Activity: Not on file  Stress: Not on file  Social Connections: Not on file  Intimate Partner Violence: Not on file    Family History:    Family History  Problem Relation  Age of  Onset   Other Mother    Pneumonia Mother    Heart attack Father    Liver cancer Sister    Stroke Sister    Heart attack Brother 17   Stroke Son 27   Healthy Daughter      ROS:  Please see the history of present illness.   All other ROS reviewed and negative.     Physical Exam/Data:   Vitals:   06/10/22 0443 06/10/22 0446 06/10/22 0700 06/10/22 0751  BP: (!) 153/53  (!) 133/55 (!) 133/55  Pulse:   (!) 48 79  Resp: 16  18   Temp: 98.3 F (36.8 C)  97.7 F (36.5 C)   TempSrc: Oral  Oral   SpO2: 95%  98% 97%  Weight:  102.5 kg    Height:        Intake/Output Summary (Last 24 hours) at 06/10/2022 1200 Last data filed at 06/10/2022 0916 Gross per 24 hour  Intake 463 ml  Output 650 ml  Net -187 ml      06/10/2022    4:46 AM 06/09/2022    2:26 AM 04/24/2022    1:37 PM  Last 3 Weights  Weight (lbs) 225 lb 15.5 oz 227 lb 222 lb  Weight (kg) 102.5 kg 102.967 kg 100.699 kg     Body mass index is 37.6 kg/m.  General:  Well nourished, well developed, in no acute distress HEENT: normal Neck: no JVD Vascular: No carotid bruits; Distal pulses 2+ bilaterally Cardiac:  normal S1, S2; RRR; no murmur  Lungs:  clear to auscultation bilaterally, no wheezing, rhonchi or rales  Abd: soft, nontender, no hepatomegaly  Ext: no edema Musculoskeletal:  No deformities, BUE and BLE strength normal and equal Skin: warm and dry  Neuro:  CNs 2-12 intact, no focal abnormalities noted Psych:  Normal affect   EKG:  The EKG was personally reviewed and demonstrates: Sinus rhythm, second-degree AV block Telemetry:  Telemetry was personally reviewed and demonstrates: Sinus rhythm, Mobitz 1 AV block  Relevant CV Studies: TTE pending  Laboratory Data:  High Sensitivity Troponin:   Recent Labs  Lab 06/09/22 0249 06/09/22 0602  TROPONINIHS 31* 31*     Chemistry Recent Labs  Lab 06/09/22 0249 06/10/22 0108  NA 139 137  K 3.8 4.0  CL 104 102  CO2 24 25  GLUCOSE 118* 157*  BUN  33* 25*  CREATININE 1.35* 1.14*  CALCIUM 9.0 8.5*  MG 1.9 1.9  GFRNONAA 38* 47*  ANIONGAP 11 10    Recent Labs  Lab 06/09/22 0249  PROT 5.8*  ALBUMIN 3.2*  AST 20  ALT 7  ALKPHOS 80  BILITOT 0.6   Lipids No results for input(s): "CHOL", "TRIG", "HDL", "LABVLDL", "LDLCALC", "CHOLHDL" in the last 168 hours.  Hematology Recent Labs  Lab 06/09/22 0249 06/10/22 0108  WBC 10.1 9.5  RBC 4.30 4.18  HGB 13.0 12.6  HCT 40.4 38.3  MCV 94.0 91.6  MCH 30.2 30.1  MCHC 32.2 32.9  RDW 14.1 13.8  PLT 200 189   Thyroid  Recent Labs  Lab 06/09/22 0810  TSH 4.104    BNPNo results for input(s): "BNP", "PROBNP" in the last 168 hours.  DDimer No results for input(s): "DDIMER" in the last 168 hours.   Radiology/Studies:  DG Chest Portable 1 View  Result Date: 06/09/2022 CLINICAL DATA:  Difficulty breathing EXAM: PORTABLE CHEST 1 VIEW COMPARISON:  02/13/2022 FINDINGS: Cardiac shadow is enlarged but stable. Postsurgical changes are again seen. Aortic  calcifications are noted. The lungs are well aerated bilaterally. Minimal scarring is again seen in the left base. No bony abnormality is seen. IMPRESSION: Mild scarring in the left base. Electronically Signed   By: Inez Catalina M.D.   On: 06/09/2022 02:55     Assessment and Plan:   Second-degree AV block: She has had evidence of both Mobitz 1 AV block and 2-1 AV block.  Since her rate controlling medications have been held, she has had no further episodes of severe bradycardia, though her 2-1 AV block likely occurred while sleeping.  At this point, as she is awake and having Mobitz 1 AV block, would prefer to avoid pacemaker implant.  She feels well and is without symptoms currently.  We Bryer Gottsch arrange for her to wear a cardiac monitor when she is discharged for further evaluation. Paroxysmal atrial fibrillation: Currently on Eliquis.  No atrial fibrillation noted on this admission.  We Rowland Ericsson continue with current management.   Risk  Assessment/Risk Scores:            [facility] Broderick Fonseca sign off.   Medication Recommendations: Hold AV nodal blockers Other recommendations (labs, testing, etc): Cardiac monitor at discharge Follow up as an outpatient: Pending cardiac monitor  For questions or updates, please contact [facility] Please consult [email or web address] for contact info under    Signed, Sada Mazzoni Meredith Leeds, MD  06/10/2022 12:00 PM

## 2022-06-10 NOTE — ED Provider Notes (Signed)
[place] 6E PROGRESSIVE CARE Provider Note   CSN: [id number] Arrival date & time: 06/09/22  0207     History  Chief Complaint  Patient presents with   Bradycardia    Patient arrives from [place] due to bradycardia. Per staff at [facility] patient's HR usually in the 60s and tonight it was in the 30's and 40s. Patient arrives alert, oriented and answering all questions appropriately.    Sierra Wright is a 88 y.o. female.  88 year old female that presents the ER today for asymptomatic bradycardia.  She lives at a skilled nursing facility and on routine vital sign checks they found her heart rate to be in the 40s.  Patient initially did not want to come the hospital was convinced.  She is thinking does not feel any lightheadedness, chest pain, shortness of breath or other symptoms.  EMS picked her up and in route she had heart rates in the mid 30s.  Normal blood pressure.  Normal mental status.  On arrival here she has no complaints still.  On review of her medication list she has a history of A-fib on metoprolol and anticoagulants.        Home Medications Prior to Admission medications   Medication Sig Start Date End Date Taking? Authorizing Provider  albuterol (PROVENTIL) (2.5 MG/3ML) 0.083% nebulizer solution Take 2.5 mg by nebulization every 6 (six) hours as needed for wheezing or shortness of breath. 12/28/21  Yes [provider]  amLODipine (NORVASC) 10 MG tablet Take 10 mg by mouth daily.   Yes [provider]  apixaban (ELIQUIS) 5 MG TABS tablet Take 1 tablet (5 mg total) by mouth 2 (two) times daily. 10/18/21  Yes Zannie Cove, MD  Ascorbic Acid (VITAMIN C) 1000 MG tablet Take 1,000 mg by mouth daily.   Yes [provider]  benazepril (LOTENSIN) 10 MG tablet Take 1 tablet (10 mg total) by mouth daily. 10/18/21  Yes Zannie Cove, MD  bisacodyl (DULCOLAX) 10 MG suppository Place 10 mg rectally daily as needed for moderate constipation (if not  relieved by MOM).   Yes [provider]  carbidopa-levodopa (SINEMET IR) 25-100 MG tablet Take 1.5 tablets by mouth at bedtime. 12/16/20  Yes Patel, Donika K, DO  cholecalciferol (VITAMIN D3) 25 MCG (1000 UNIT) tablet Take 1,000 Units by mouth daily.   Yes [provider]  furosemide (LASIX) 40 MG tablet Take 1 tablet (40 mg total) by mouth daily. 10/18/21  Yes Zannie Cove, MD  hydrALAZINE (APRESOLINE) 25 MG tablet TAKE 1 TABLET BY MOUTH THREE TIMES DAILY Patient taking differently: Take 25 mg by mouth 3 (three) times daily. 11/22/20  Yes [name], Deloris Ping, MD  insulin glargine (LANTUS SOLOSTAR) 100 UNIT/ML Solostar Pen Inject 28 Units into the skin daily.   Yes [provider]  insulin lispro (HUMALOG) 100 UNIT/ML KwikPen Inject 5 Units into the skin 2 (two) times daily. Before lunch and dinner   Yes [provider]  isosorbide mononitrate (IMDUR) 30 MG 24 hr tablet Take 1 tablet by mouth once daily Patient taking differently: Take 30 mg by mouth daily. 07/10/21  Yes [name], Deloris Ping, MD  levothyroxine (SYNTHROID) 112 MCG tablet Take 112 mcg by mouth daily before breakfast.   Yes [provider]  Magnesium Hydroxide (MILK OF MAGNESIA PO) Take 30 mLs by mouth daily as needed (constipation).   Yes [provider]  meclizine (ANTIVERT) 25 MG tablet Take 25 mg by mouth daily.   Yes [provider]  mupirocin ointment (BACTROBAN) 2 % Apply 1 Application topically 2 (two) times daily. TO LESION ON DORSAL RIGHT HAND 04/13/22  Yes [provider]  nitroGLYCERIN (NITROSTAT) 0.4 MG SL tablet DISSOLVE ONE TABLET UNDER THE TONGUE EVERY 5 MINUTES AS NEEDED FOR CHEST PAIN. Patient taking differently: Place 0.4 mg under the tongue every 5 (five) minutes as needed for chest pain. 04/19/21  Yes [name], Deloris Ping, MD  NON FORMULARY Apply 1 Application topically 2 (two) times daily. To back and other affected area - Anti-Itch Lotion 0.5%-0.5%   Yes  [provider]  nystatin [facility]) powder Apply 1 Application topically 2 (two) times daily. Apply to buttocks and groin followed by a thin layer of barrier cream.   Yes [provider]  ondansetron (ZOFRAN) 4 MG tablet Take 4 mg by mouth every 6 (six) hours as needed for nausea. 08/20/20  Yes [provider]  oxyCODONE (ROXICODONE) 5 MG immediate release tablet Take 1 tablet (5 mg total) by mouth every 8 (eight) hours as needed for severe pain. 04/10/22  Yes Medina-Vargas, Monina C, NP  Oyster Shell Calcium 500 MG TABS Take 500 mg by mouth daily. TAKE 1 TABLET BY MOUTH ONCE DAILY FOR SUPPLEMENT   Yes [provider]  pregabalin (LYRICA) 50 MG capsule Take 1 capsule (50 mg total) by mouth 2 (two) times daily. 04/17/22  Yes Medina-Vargas, Monina C, NP  saccharomyces boulardii (FLORASTOR) 250 MG capsule Take 250 mg by mouth daily.   Yes [provider]  Sodium Phosphates (RA SALINE ENEMA RE) Place 1 Dose rectally daily as needed (constipation not relieved by bisacodyl).   Yes [provider]  vitamin B-12 (CYANOCOBALAMIN) 1000 MCG tablet Take 1,000 mcg by mouth daily.   Yes [provider]  Continuous Blood Gluc Receiver (FREESTYLE LIBRE 2 READER) DEVI  03/02/22   [provider]  Continuous Blood Gluc Sensor (FREESTYLE LIBRE 2 SENSOR) MISC  03/01/22   [provider]  nystatin (MYCOSTATIN/NYSTOP) powder APPLY TOPICALLY TO BUTTOCKS AND GROIN FOLLOWED BY A THIN LAYER OF BARRIER CREAM TWICE A DAY    [provider]      Allergies    Duloxetine hcl, Statins, Amitriptyline, Benadryl [diphenhydramine hcl (sleep)], Ezetimibe, Gabapentin, Metformin hcl, Other, Sitagliptin, Tylenol [acetaminophen], and Colesevelam hcl    Review of Systems   Review of Systems  Physical Exam Updated Vital Signs BP (!) 133/55 (BP Location: Left Arm)   Pulse (!) 48   Temp 97.7 F (36.5 C) (Oral)   Resp 18   Ht 5\' 5"  (1.651 m)   Wt 102.5  kg   SpO2 98%   BMI 37.60 kg/m  Physical Exam Vitals and nursing note reviewed.  Constitutional:      Appearance: She is well-developed.  HENT:     Head: Normocephalic and atraumatic.     Nose: Nose normal. No congestion or rhinorrhea.     Mouth/Throat:     Mouth: Mucous membranes are moist.     Pharynx: Oropharynx is clear.  Eyes:     Pupils: Pupils are equal, round, and reactive to light.  Cardiovascular:     Rate and Rhythm: Regular rhythm. Bradycardia present.  Pulmonary:     Effort: No respiratory distress.     Breath sounds: No stridor.  Abdominal:     General: Abdomen is flat. There is no distension.  Musculoskeletal:        General: No swelling or tenderness. Normal range of motion.     Cervical  back: Normal range of motion.  Skin:    General: Skin is warm and dry.  Neurological:     General: No focal deficit present.     Mental Status: She is alert.     ED Results / Procedures / Treatments   Labs (all labs ordered are listed, but only abnormal results are displayed) Labs Reviewed  COMPREHENSIVE METABOLIC PANEL - Abnormal; Notable for the following components:      Result Value   Glucose, Bld 118 (*)    BUN 33 (*)    Creatinine, Ser 1.35 (*)    Total Protein 5.8 (*)    Albumin 3.2 (*)    GFR, Estimated 38 (*)    All other components within normal limits  URINALYSIS, ROUTINE W REFLEX MICROSCOPIC - Abnormal; Notable for the following components:   APPearance CLOUDY (*)    Leukocytes,Ua LARGE (*)    WBC, UA >50 (*)    Bacteria, UA RARE (*)    All other components within normal limits  GLUCOSE, CAPILLARY - Abnormal; Notable for the following components:   Glucose-Capillary 141 (*)    All other components within normal limits  GLUCOSE, CAPILLARY - Abnormal; Notable for the following components:   Glucose-Capillary 206 (*)    All other components within normal limits  BASIC METABOLIC PANEL - Abnormal; Notable for the following components:   Glucose, Bld  157 (*)    BUN 25 (*)    Creatinine, Ser 1.14 (*)    Calcium 8.5 (*)    GFR, Estimated 47 (*)    All other components within normal limits  GLUCOSE, CAPILLARY - Abnormal; Notable for the following components:   Glucose-Capillary 149 (*)    All other components within normal limits  GLUCOSE, CAPILLARY - Abnormal; Notable for the following components:   Glucose-Capillary 153 (*)    All other components within normal limits  GLUCOSE, CAPILLARY - Abnormal; Notable for the following components:   Glucose-Capillary 167 (*)    All other components within normal limits  TROPONIN I (HIGH SENSITIVITY) - Abnormal; Notable for the following components:   Troponin I (High Sensitivity) 31 (*)    All other components within normal limits  TROPONIN I (HIGH SENSITIVITY) - Abnormal; Notable for the following components:   Troponin I (High Sensitivity) 31 (*)    All other components within normal limits  MRSA NEXT GEN BY PCR, NASAL  URINE CULTURE  CBC WITH DIFFERENTIAL/PLATELET  MAGNESIUM  TSH  MAGNESIUM  CBC    EKG EKG Interpretation  Date/Time:  Saturday June 09 2022 02:39:08 EDT Ventricular Rate:  52 PR Interval:    QRS Duration: 174 QT Interval:  510 QTC Calculation: 475 R Axis:   63 Text Interpretation: Atrial fibrillation Right bundle branch block Confirmed by Marily Memos [phone]) on 06/09/2022 3:07:48 AM  Radiology DG Chest Portable 1 View  Result Date: 06/09/2022 CLINICAL DATA:  Difficulty breathing EXAM: PORTABLE CHEST 1 VIEW COMPARISON:  02/13/2022 FINDINGS: Cardiac shadow is enlarged but stable. Postsurgical changes are again seen. Aortic calcifications are noted. The lungs are well aerated bilaterally. Minimal scarring is again seen in the left base. No bony abnormality is seen. IMPRESSION: Mild scarring in the left base. Electronically Signed   By: Alcide Clever M.D.   On: 06/09/2022 02:55    Procedures Procedures    Medications Ordered in ED Medications  sodium chloride  flush (NS) 0.9 % injection 3 mL (3 mLs Intravenous Given 06/09/22 2206)  acetaminophen (TYLENOL) tablet 650  mg (has no administration in time range)    Or  acetaminophen (TYLENOL) suppository 650 mg (has no administration in time range)  ondansetron (ZOFRAN) tablet 4 mg ( Oral See Alternative 06/09/22 2253)    Or  ondansetron (ZOFRAN) injection 4 mg (4 mg Intravenous Given 06/09/22 2253)  albuterol (PROVENTIL) (2.5 MG/3ML) 0.083% nebulizer solution 2.5 mg (has no administration in time range)  insulin aspart (novoLOG) injection 5 Units (5 Units Subcutaneous Given 06/09/22 1749)  insulin aspart (novoLOG) injection 0-9 Units (3 Units Subcutaneous Given 06/09/22 1749)  pneumococcal 20-valent conjugate vaccine (PREVNAR 20) injection 0.5 mL (has no administration in time range)  cefTRIAXone (ROCEPHIN) 1 g in sodium chloride 0.9 % 100 mL IVPB (1 g Intravenous New Bag/Given 06/09/22 1954)  hydrALAZINE (APRESOLINE) tablet 25 mg (25 mg Oral Patient Refused/Not Given 06/09/22 2151)  isosorbide mononitrate (IMDUR) 24 hr tablet 30 mg (has no administration in time range)  levothyroxine (SYNTHROID) tablet 112 mcg (112 mcg Oral Given 06/10/22 0445)  insulin glargine-yfgn (SEMGLEE) injection 20 Units (20 Units Subcutaneous Given 06/09/22 2203)  oxyCODONE (Oxy IR/ROXICODONE) immediate release tablet 5 mg (5 mg Oral Given 06/09/22 2218)  amLODipine (NORVASC) tablet 10 mg (has no administration in time range)  bisacodyl (DULCOLAX) suppository 10 mg (has no administration in time range)  meclizine (ANTIVERT) tablet 25 mg (has no administration in time range)  saccharomyces boulardii (FLORASTOR) capsule 250 mg (has no administration in time range)  apixaban (ELIQUIS) tablet 5 mg (has no administration in time range)  carbidopa-levodopa (SINEMET IR) 25-100 MG per tablet immediate release 1.5 tablet (1.5 tablets Oral Given 06/09/22 2202)  pregabalin (LYRICA) capsule 50 mg (50 mg Oral Given 06/09/22 2202)  nystatin (MYCOSTATIN/NYSTOP)  topical powder 1 Application (1 Application Topical Given 06/09/22 2203)  mupirocin ointment (BACTROBAN) 2 % 1 Application (1 Application Topical Given 06/09/22 2202)    ED Course/ Medical Decision Making/ A&P                           Medical Decision Making Amount and/or Complexity of Data Reviewed Labs: ordered. Radiology: ordered. ECG/medicine tests: ordered.  Risk Decision regarding hospitalization.   On arrival patient is still profoundly bradycardic.  Review the EKG by myself appears to be slow A-fib.  Electrolytes are within normal limits.  Troponin slightly elevated however not significantly so.  Discussed with cardiology who saw the patient.  Repeat EKG looks to be more of a sinus bradycardia with a significantly prolonged PR interval.  Cardiology thinks that the first EKG may have blocked PACs and thus why it looks so abnormal and the newer one has the same issue.  Suggest hold metoprolol, telemetry and no interventions for now.  Discussed with hospitalist for admission.   Final Clinical Impression(s) / ED Diagnoses Final diagnoses:  Bradycardia    Rx / DC Orders ED Discharge Orders     None         Christine Morton, Barbara Cower, MD 06/10/22 2304

## 2022-06-11 DIAGNOSIS — R001 Bradycardia, unspecified: Secondary | ICD-10-CM | POA: Diagnosis not present

## 2022-06-11 LAB — BASIC METABOLIC PANEL
Anion gap: 9 (ref 5–15)
BUN: 28 mg/dL — ABNORMAL HIGH (ref 8–23)
CO2: 25 mmol/L (ref 22–32)
Calcium: 8.6 mg/dL — ABNORMAL LOW (ref 8.9–10.3)
Chloride: 102 mmol/L (ref 98–111)
Creatinine, Ser: 1.37 mg/dL — ABNORMAL HIGH (ref 0.44–1.00)
GFR, Estimated: 38 mL/min — ABNORMAL LOW (ref >60)
Glucose, Bld: 173 mg/dL — ABNORMAL HIGH (ref 70–99)
Potassium: 4 mmol/L (ref 3.5–5.1)
Sodium: 136 mmol/L (ref 135–145)

## 2022-06-11 LAB — URINE CULTURE: Culture: >=100000 — AB

## 2022-06-11 LAB — GLUCOSE, CAPILLARY
Glucose-Capillary: 148 mg/dL — ABNORMAL HIGH (ref 70–99)
Glucose-Capillary: 172 mg/dL — ABNORMAL HIGH (ref 70–99)
Glucose-Capillary: 174 mg/dL — ABNORMAL HIGH (ref 70–99)

## 2022-06-11 NOTE — Progress Notes (Signed)
PROGRESS NOTE  Sierra Wright FWY:[id number] DOB: March 24, 1934 DOA: 06/09/2022 PCP: No primary care provider on file.  HPI/Recap of past 24 hours: Sierra Wright is a 88 y.o. female with medical history significant of paroxysmal atrial fibrillation on Eliquis, CAD s/p CABG, HFpEF, diabetes mellitus type 2, hypothyroidism, RLS who presented from SNF due to slow heart rates.  She had been at the rehab after suffering ankle fractures last year.  She spends most of her time in a wheelchair due to issues with her ankles as well as knees.  On admission to the ED, heart rates were in the 20s to 80s.  Work-up revealed bradycardia, Mobitz 1 and intermittent 2-1 AV block for which cardiology recommended to hold off on home beta-blocker.  Seen by cardiology and electrophysiology.  Electrophysiology signed off, recommended to continue to hold AV nodal blockers, cardiac monitor at discharge, and follow-up with electrophysiology outpatient.  06/11/2022:    The patient was seen and examined at her bedside.  Her heart rate fluctuates between the 30s to the 140s.  She is asymptomatic while laying in bed.  Assessment/Plan: Principal Problem:   Bradycardia Active Problems:   Paroxysmal atrial fibrillation (HCC)   Acute kidney injury superimposed on chronic kidney disease (HCC)   Acute lower UTI   Elevated troponin   Essential hypertension   Type 2 diabetes mellitus with hemoglobin A1c goal of less than 7.5% (HCC)   Hypothyroidism   Afib, present with bradycardia Mobitz 1 and intermittent 2-1 AV block Continue to hold home metoprolol  Seen by cardiology and electrophysiology.  Electrophysiology signed off, recommended to continue to hold AV nodal blockers, cardiac monitor at discharge, and follow-up with electrophysiology outpatient. Heart rate ranging between 30"s and 140's on 06/11/2022  Klebsiella pneumonia UTI, POA Resistant to ampicillin and Macrobid Rocephin started on 06/09/2022.    Insulin-dependent type 2 diabetes with hyper glycemia Continue long acting insulin, short acting insulin and insulin sliding scale. Last hemoglobin A1c 8.0 on 04/16/2022.   Hypothyroidism Continue home levothyroxine.   Constipation RN reports patient has diarrhea, kub showed solid stool in bowel, likely overflow diarrhea in the setting on constipation, need to treat constipation.   On Senokot 1 tablet twice daily   Wheelchair to bedbound due to bilateral ankle fractures Non-surgical management recommended at the time.   Obesity BMI 38 Recommend healthy dieting and regular physical activity.       DVT prophylaxis:  apixaban (ELIQUIS) tablet 5 mg    Code Status:   Code Status: Full Code   Family Communication: patient Disposition:      Dispo: The patient is from:SNF              Anticipated d/c is to: SNF              Anticipated d/c date is: Return to SNF once heart rate is stable, fluctuating between 59 and 141 on 06/11/2022.  Tentative discharge date to [facility] 06/12/2022.       Status is: Inpatient The patient requires at least 2 midnights for further evaluation and treatment of present condition.    Objective: Vitals:   06/11/22 0503 06/11/22 0737 06/11/22 0930 06/11/22 1143  BP: (!) 147/30 (!) 141/57 (!) 159/37 (!) 126/50  Pulse: (!) 40 68  85  Resp: 18 20  18   Temp: 97.8 F (36.6 C) 97.7 F (36.5 C)  97.6 F (36.4 C)  TempSrc: Oral Oral  Oral  SpO2: 98% 98%  99%  Weight:  Height:        Intake/Output Summary (Last 24 hours) at 06/11/2022 1404 Last data filed at 06/11/2022 1144 Gross per 24 hour  Intake 120 ml  Output 1450 ml  Net -1330 ml   Filed Weights   06/09/22 0226 06/10/22 0446 06/11/22 0500  Weight: 103 kg 102.5 kg 104.5 kg    Exam:  General: 88 y.o. year-old female well developed well nourished in no acute distress.  Alert and oriented x3. Cardiovascular: Regular rate and rhythm with no rubs or gallops.  No thyromegaly or JVD noted.    Respiratory: Clear to auscultation with no wheezes or rales. Good inspiratory effort. Abdomen: Soft nontender nondistended with normal bowel sounds x4 quadrants. Musculoskeletal: No lower extremity edema. 2/4 pulses in all 4 extremities. Skin: No ulcerative lesions noted or rashes, Psychiatry: Mood is appropriate for condition and setting   Data Reviewed: CBC: Recent Labs  Lab 06/09/22 0249 06/10/22 0108  WBC 10.1 9.5  NEUTROABS 5.9  --   HGB 13.0 12.6  HCT 40.4 38.3  MCV 94.0 91.6  PLT 200 189   Basic Metabolic Panel: Recent Labs  Lab 06/09/22 0249 06/10/22 0108 06/11/22 0140  NA 139 137 136  K 3.8 4.0 4.0  CL 104 102 102  CO2 24 25 25   GLUCOSE 118* 157* 173*  BUN 33* 25* 28*  CREATININE 1.35* 1.14* 1.37*  CALCIUM 9.0 8.5* 8.6*  MG 1.9 1.9  --    GFR: Estimated Creatinine Clearance: 35.4 mL/min (A) (by C-G formula based on SCr of 1.37 mg/dL (H)). Liver Function Tests: Recent Labs  Lab 06/09/22 0249  AST 20  ALT 7  ALKPHOS 80  BILITOT 0.6  PROT 5.8*  ALBUMIN 3.2*   No results for input(s): "LIPASE", "AMYLASE" in the last 168 hours. No results for input(s): "AMMONIA" in the last 168 hours. Coagulation Profile: No results for input(s): "INR", "PROTIME" in the last 168 hours. Cardiac Enzymes: No results for input(s): "CKTOTAL", "CKMB", "CKMBINDEX", "TROPONINI" in the last 168 hours. BNP (last 3 results) No results for input(s): "PROBNP" in the last 8760 hours. HbA1C: No results for input(s): "HGBA1C" in the last 72 hours. CBG: Recent Labs  Lab 06/10/22 1246 06/10/22 1651 06/10/22 2214 06/11/22 0735 06/11/22 1141  GLUCAP 216* 203* 103* 148* 172*   Lipid Profile: No results for input(s): "CHOL", "HDL", "LDLCALC", "TRIG", "CHOLHDL", "LDLDIRECT" in the last 72 hours. Thyroid Function Tests: Recent Labs    06/09/22 0810  TSH 4.104   Anemia Panel: No results for input(s): "VITAMINB12", "FOLATE", "FERRITIN", "TIBC", "IRON", "RETICCTPCT" in the  last 72 hours. Urine analysis:    Component Value Date/Time   COLORURINE YELLOW 06/09/2022 1855   APPEARANCEUR CLOUDY (A) 06/09/2022 1855   LABSPEC 1.013 06/09/2022 1855   PHURINE 5.0 06/09/2022 1855   GLUCOSEU NEGATIVE 06/09/2022 1855   HGBUR NEGATIVE 06/09/2022 1855   BILIRUBINUR NEGATIVE 06/09/2022 1855   KETONESUR NEGATIVE 06/09/2022 1855   PROTEINUR NEGATIVE 06/09/2022 1855   NITRITE NEGATIVE 06/09/2022 1855   LEUKOCYTESUR LARGE (A) 06/09/2022 1855   Sepsis Labs: @LABRCNTIP (procalcitonin:4,lacticidven:4)  ) Recent Results (from the past 240 hour(s))  MRSA Next Gen by PCR, Nasal     Status: None   Collection Time: 06/09/22  4:19 PM   Specimen: Urine, Clean Catch; Nasal Swab  Result Value Ref Range Status   MRSA by PCR Next Gen NOT DETECTED NOT DETECTED Final    Comment: (NOTE) The GeneXpert MRSA Assay (FDA approved for NASAL specimens only), is one component  of a comprehensive MRSA colonization surveillance program. It is not intended to diagnose MRSA infection nor to guide or monitor treatment for MRSA infections. Test performance is not FDA approved in patients less than 35 years old. Performed at [facility], [address] [place], [place] [address]   Urine Culture     Status: Abnormal   Collection Time: 06/09/22  5:28 PM   Specimen: Urine, Clean Catch  Result Value Ref Range Status   Specimen Description URINE, CLEAN CATCH  Final   Special Requests   Final    NONE Performed at [facility], [address], [place], [place] [address]    Culture >=100,000 COLONIES/mL KLEBSIELLA PNEUMONIAE (A)  Final   Report Status 06/11/2022 FINAL  Final   Organism ID, Bacteria KLEBSIELLA PNEUMONIAE (A)  Final      Susceptibility   Klebsiella pneumoniae - MIC*    AMPICILLIN >=32 RESISTANT Resistant     CEFAZOLIN <=4 SENSITIVE Sensitive     CEFEPIME <=0.12 SENSITIVE Sensitive     CEFTRIAXONE <=0.25 SENSITIVE Sensitive     CIPROFLOXACIN <=0.25 SENSITIVE  Sensitive     GENTAMICIN <=1 SENSITIVE Sensitive     IMIPENEM <=0.25 SENSITIVE Sensitive     NITROFURANTOIN 64 INTERMEDIATE Intermediate     TRIMETH/SULFA <=20 SENSITIVE Sensitive     AMPICILLIN/SULBACTAM 8 SENSITIVE Sensitive     PIP/TAZO <=4 SENSITIVE Sensitive     * >=100,000 COLONIES/mL KLEBSIELLA PNEUMONIAE      Studies: DG Abd 1 View  Result Date: 06/10/2022 CLINICAL DATA:  Generalized abdominal pain and distension. EXAM: ABDOMEN - 1 VIEW COMPARISON:  Abdominal x-ray 10/20/2021 FINDINGS: The bowel gas pattern is normal. Stool burden is average. No radio-opaque calculi. There are surgical clips in the upper abdomen and right upper quadrant. Vascular calcifications are seen in the abdomen and pelvis as well as lower extremities. There are severe degenerative changes of the left hip. Degenerative changes also affect the lumbar spine. IMPRESSION: 1. Nonobstructive bowel gas pattern. Electronically Signed   By: Darliss Cheney M.D.   On: 06/10/2022 15:36    Scheduled Meds:  amLODipine  10 mg Oral Daily   apixaban  5 mg Oral BID   carbidopa-levodopa  1.5 tablet Oral QHS   hydrALAZINE  25 mg Oral TID   insulin aspart  0-9 Units Subcutaneous TID WC   insulin aspart  5 Units Subcutaneous BID WC   insulin glargine-yfgn  20 Units Subcutaneous Daily   isosorbide mononitrate  30 mg Oral Daily   levothyroxine  112 mcg Oral QAC breakfast   meclizine  25 mg Oral Daily   mupirocin ointment  1 Application Topical BID   nystatin  1 Application Topical BID   pneumococcal 20-valent conjugate vaccine  0.5 mL Intramuscular Tomorrow-1000   polyethylene glycol  17 g Oral Daily   pregabalin  50 mg Oral BID   saccharomyces boulardii  250 mg Oral Daily   [START ON 06/12/2022] senna-docusate  1 tablet Oral BID   sodium chloride flush  3 mL Intravenous Q12H    Continuous Infusions:  cefTRIAXone (ROCEPHIN)  IV 1 g (06/10/22 2149)     LOS: 1 day     Darlin Drop, MD [facility] Pager  [phone]  If 7PM-7AM, please contact night-coverage [email or web address] Password [email or web address] 06/11/2022, 2:04 PM

## 2022-06-11 NOTE — TOC Initial Note (Signed)
Transition of Care [facility]) - Initial/Assessment Note    Patient Details  Name: Sierra Wright MRN: [id number] Date of Birth: Oct 02, 1934  [facility]) CM/SW Contact:    Delilah Shan, LCSWA Phone Number: 06/11/2022, 2:03 PM  Clinical Narrative:                  CSW spoke with patient regarding dc plan. Patient request for CSW to call her daughter Charlton Amor regarding dc plan. CSW spoke with Sherrie. Sherrie confirmed patient comes from [place] long term. Sherrie confirmed plan is for patient to return to [facility] when medically ready for dc. CSW will continue to follow and assist with patients dc planning needs.  Expected Discharge Plan: Skilled Nursing Facility Barriers to Discharge: Continued Medical Work up   Patient Goals and CMS Choice Patient states their goals for this hospitalization and ongoing recovery are:: SNF CMS Medicare.gov Compare Post Acute Care list provided to:: Patient Represenative (must comment) (Patients daughter Charlton Amor) Choice offered to / list presented to : Adult Children (Patients daughter Charlton Amor)  Expected Discharge Plan and Services Expected Discharge Plan: Skilled Nursing Facility In-house Referral: Clinical Social Work     Living arrangements for the past 2 months: Skilled Holiday representative (From [facility] and rehab)                                      Prior Living Arrangements/Services Living arrangements for the past 2 months: Skilled Holiday representative (From [facility] and rehab) Lives with:: Facility Resident Patient language and need for interpreter reviewed:: Yes Do you feel safe going back to the place where you live?: Yes      Need for Family Participation in Patient Care: Yes (Comment) Care giver support system in place?: Yes (comment)   Criminal Activity/Legal Involvement Pertinent to Current Situation/Hospitalization: No - Comment as needed  Activities of Daily Living Home Assistive  Devices/Equipment: Wheelchair ADL Screening (condition at time of admission) Patient's cognitive ability adequate to safely complete daily activities?: Yes Is the patient deaf or have difficulty hearing?: Yes Does the patient have difficulty seeing, even when wearing glasses/contacts?: No Does the patient have difficulty concentrating, remembering, or making decisions?: No Patient able to express need for assistance with ADLs?: Yes Does the patient have difficulty dressing or bathing?: Yes Independently performs ADLs?: No Communication: Independent Dressing (OT): Dependent Is this a change from baseline?: Pre-admission baseline Grooming: Independent Feeding: Independent Bathing: Dependent Is this a change from baseline?: Pre-admission baseline Toileting: Dependent Is this a change from baseline?: Pre-admission baseline In/Out Bed: Dependent Is this a change from baseline?: Pre-admission baseline Walks in Home: Dependent Is this a change from baseline?: Pre-admission baseline Does the patient have difficulty walking or climbing stairs?: Yes Weakness of Legs: Both Weakness of Arms/Hands: Both  Permission Sought/Granted Permission sought to share information with : Case Manager, Family Supports, Oceanographer granted to share information with : Yes, Verbal Permission Granted  Share Information with NAME: Sherrie and Molly Maduro  Permission granted to share info w AGENCY: SNF  Permission granted to share info w Relationship: daughter and Spouse  Permission granted to share info w Contact Information: Charlton Amor [phone]  Emotional Assessment   Attitude/Demeanor/Rapport: Gracious Affect (typically observed): Calm Orientation: : Oriented to Self, Oriented to Place, Oriented to  Time, Oriented to Situation (WDL) Alcohol / Substance Use: Not Applicable Psych Involvement: No (comment)  Admission diagnosis:  Bradycardia [R00.1] Patient  Active Problem List   Diagnosis Date Noted   Bradycardia 06/09/2022   Elevated troponin 06/09/2022   Skin lesion of hand 04/13/2022   DM (diabetes mellitus), type 2 with peripheral vascular complications (HCC) 04/13/2022   Paroxysmal atrial fibrillation (HCC) 01/24/2022   Coronary artery disease involving coronary bypass graft of native heart with angina pectoris (HCC) 01/24/2022   Unspecified protein-calorie malnutrition (HCC) 10/27/2021   Leukocytosis 10/20/2021   Acute lower UTI 10/20/2021   Nausea    PVD (peripheral vascular disease) (HCC)    Ankle fracture 10/12/2021   Acute kidney injury superimposed on chronic kidney disease (HCC) 10/12/2021   Anemia 10/12/2021   Diabetic neuropathy (HCC) 01/25/2021   Diabetic retinopathy (HCC) 01/25/2021   Hyperglycemia due to type 2 diabetes mellitus (HCC) 01/25/2021   Unspecified mononeuropathy of right lower limb 01/25/2021   S/P cardiac cath 08/13/18 stable CAD 08/14/2018   Orthopnea 08/12/2018   Chest pain 09/01/2016   Diabetic polyneuropathy associated with diabetes mellitus due to underlying condition (HCC) 07/26/2016   Chronic daily headache 07/26/2016   RLS (restless legs syndrome) 07/26/2016   Ulnar neuropathy of right upper extremity 07/26/2016   Ischemic stroke (HCC) 07/26/2016   CVA (cerebral infarction): Early subacute 07/14/2016   Back pain 07/14/2016   Essential hypertension    2nd degree AV block    Hypothyroidism    Coronary artery disease involving coronary bypass graft of native heart without angina pectoris    Binocular vision disorder with diplopia 07/13/2016   Weakness 07/13/2016   Chronic diastolic CHF (congestive heart failure) (HCC) 04/16/2016   Osteoporosis 07/27/2015    Class: Chronic   Spinal stenosis, lumbar region, with neurogenic claudication 07/26/2015    Class: Chronic   Spondylolisthesis of lumbar region 07/26/2015    Class: Chronic   Hx of CABG 03/15/2014   Osteoarthritis 03/15/2014   Type 2  diabetes mellitus with hemoglobin A1c goal of less than 7.5% (HCC) 03/15/2014   Peripheral edema 03/15/2014   Benign hypertensive heart disease without heart failure 03/15/2014   Right bundle branch block 03/15/2014   First degree AV block 03/15/2014   Sinus bradycardia by electrocardiogram 03/15/2014   Dizziness 03/15/2014   PCP:  No primary care provider on file. Pharmacy:   [facility] [address], [place] - [address] [place] [address] Phone: [phone] Fax: [phone]     Social Determinants of Health (SDOH) Interventions    Readmission Risk Interventions     No data to display

## 2022-06-11 NOTE — Progress Notes (Signed)
Patient is from [facility] Re[facility] Long term. CSW will continue to follow and assist with patients dc planning needs.

## 2022-06-12 DIAGNOSIS — I10 Essential (primary) hypertension: Secondary | ICD-10-CM | POA: Diagnosis not present

## 2022-06-12 DIAGNOSIS — N179 Acute kidney failure, unspecified: Secondary | ICD-10-CM | POA: Diagnosis not present

## 2022-06-12 DIAGNOSIS — R001 Bradycardia, unspecified: Secondary | ICD-10-CM | POA: Diagnosis not present

## 2022-06-12 DIAGNOSIS — N390 Urinary tract infection, site not specified: Secondary | ICD-10-CM | POA: Diagnosis not present

## 2022-06-12 LAB — BASIC METABOLIC PANEL
Anion gap: 8 (ref 5–15)
BUN: 20 mg/dL (ref 8–23)
CO2: 25 mmol/L (ref 22–32)
Calcium: 9.4 mg/dL (ref 8.9–10.3)
Chloride: 105 mmol/L (ref 98–111)
Creatinine, Ser: 0.94 mg/dL (ref 0.44–1.00)
GFR, Estimated: 59 mL/min — ABNORMAL LOW (ref >60)
Glucose, Bld: 170 mg/dL — ABNORMAL HIGH (ref 70–99)
Potassium: 3.7 mmol/L (ref 3.5–5.1)
Sodium: 138 mmol/L (ref 135–145)

## 2022-06-12 LAB — GLUCOSE, CAPILLARY
Glucose-Capillary: 164 mg/dL — ABNORMAL HIGH (ref 70–99)
Glucose-Capillary: 249 mg/dL — ABNORMAL HIGH (ref 70–99)
Glucose-Capillary: 198 mg/dL — ABNORMAL HIGH (ref 70–99)

## 2022-06-12 MED ORDER — CEPHALEXIN 500 MG PO CAPS: 500.0000 mg | ORAL_CAPSULE | Freq: Two times a day (BID) | ORAL | 0 refills | Status: AC

## 2022-06-12 NOTE — TOC Transition Note (Signed)
Transition of Care [facility]) - CM/SW Discharge Note   Patient Details  Name: Sierra Wright MRN: [id number] Date of Birth: 1934-07-03  Transition of Care [facility]) CM/SW Contact:  Delilah Shan, LCSWA Phone Number: 06/12/2022, 1:20 PM   Clinical Narrative:     Patient will DC to: [facility]  Anticipated DC date: 06/12/2022  Family notified: Sherrie   Transport by: Sharin Mons  ?  Per MD patient ready for DC to [facility] and Rehab . RN, patient, patient's family, and facility notified of DC. Discharge Summary sent to facility. RN given number for report tele# [phone] RM# [phone]. DC packet on chart. Ambulance transport requested for patient.  CSW signing off.    Final next level of care: Skilled Nursing Facility Barriers to Discharge: No Barriers Identified   Patient Goals and CMS Choice Patient states their goals for this hospitalization and ongoing recovery are:: SNF CMS Medicare.gov Compare Post Acute Care list provided to:: Patient Represenative (must comment) (Patients daughter Charlton Amor) Choice offered to / list presented to : Adult Children (Patients daughter Charlton Amor)  Discharge Placement              Patient chooses bed at: [facility] and Rehab Patient to be transferred to facility by: [facility] Name of family member notified: Sherrie Patient and family notified of of transfer: 06/12/22  Discharge Plan and Services In-house Referral: Clinical Social Work                                   Social Determinants of Health (SDOH) Interventions     Readmission Risk Interventions    06/12/2022    1:19 PM  Readmission Risk Prevention Plan  Transportation Screening Complete  HRI or Home Care Consult Complete  Social Work Consult for Recovery Care Planning/Counseling Complete  Palliative Care Screening Not Applicable

## 2022-06-12 NOTE — NC FL2 (Signed)
[place] MEDICAID FL2 LEVEL OF CARE SCREENING TOOL     IDENTIFICATION  Patient Name: Sierra Wright Birthdate: 03/01/1934 Sex: female Admission Date (Current Location): 06/09/2022  [facility] and [place] Number:  Producer, television/film/video and Address:  [facility], [address], [place], [place] [address]      Provider Number: [id number]  Attending Physician Name and Address:  Kathlen Mody, MD  Relative Name and Phone Number:  Molly Maduro (spouse)[phone] Charlton Amor (daughter) [phone]    Current Level of Care: Hospital Recommended Level of Care: Skilled Nursing Facility Prior Approval Number:    Date Approved/Denied:   PASRR Number:    Discharge Plan: SNF    Current Diagnoses: Patient Active Problem List   Diagnosis Date Noted   Bradycardia 06/09/2022   Elevated troponin 06/09/2022   Skin lesion of hand 04/13/2022   DM (diabetes mellitus), type 2 with peripheral vascular complications (HCC) 04/13/2022   Paroxysmal atrial fibrillation (HCC) 01/24/2022   Coronary artery disease involving coronary bypass graft of native heart with angina pectoris (HCC) 01/24/2022   Unspecified protein-calorie malnutrition (HCC) 10/27/2021   Leukocytosis 10/20/2021   Acute lower UTI 10/20/2021   Nausea    PVD (peripheral vascular disease) (HCC)    Ankle fracture 10/12/2021   Acute kidney injury superimposed on chronic kidney disease (HCC) 10/12/2021   Anemia 10/12/2021   Diabetic neuropathy (HCC) 01/25/2021   Diabetic retinopathy (HCC) 01/25/2021   Hyperglycemia due to type 2 diabetes mellitus (HCC) 01/25/2021   Unspecified mononeuropathy of right lower limb 01/25/2021   S/P cardiac cath 08/13/18 stable CAD 08/14/2018   Orthopnea 08/12/2018   Chest pain 09/01/2016   Diabetic polyneuropathy associated with diabetes mellitus due to underlying condition (HCC) 07/26/2016   Chronic daily headache 07/26/2016   RLS (restless legs syndrome) 07/26/2016   Ulnar  neuropathy of right upper extremity 07/26/2016   Ischemic stroke (HCC) 07/26/2016   CVA (cerebral infarction): Early subacute 07/14/2016   Back pain 07/14/2016   Essential hypertension    2nd degree AV block    Hypothyroidism    Coronary artery disease involving coronary bypass graft of native heart without angina pectoris    Binocular vision disorder with diplopia 07/13/2016   Weakness 07/13/2016   Chronic diastolic CHF (congestive heart failure) (HCC) 04/16/2016   Osteoporosis 07/27/2015   Spinal stenosis, lumbar region, with neurogenic claudication 07/26/2015   Spondylolisthesis of lumbar region 07/26/2015   Hx of CABG 03/15/2014   Osteoarthritis 03/15/2014   Type 2 diabetes mellitus with hemoglobin A1c goal of less than 7.5% (HCC) 03/15/2014   Peripheral edema 03/15/2014   Benign hypertensive heart disease without heart failure 03/15/2014   Right bundle branch block 03/15/2014   First degree AV block 03/15/2014   Sinus bradycardia by electrocardiogram 03/15/2014   Dizziness 03/15/2014    Orientation RESPIRATION BLADDER Height & Weight     Self, Time, Situation, Place (WDL)  Normal Incontinent Weight: 226 lb 10.1 oz (102.8 kg) Height:  5\' 5"  (165.1 cm)  BEHAVIORAL SYMPTOMS/MOOD NEUROLOGICAL BOWEL NUTRITION STATUS      Incontinent Diet (Please see discharge summary)  AMBULATORY STATUS COMMUNICATION OF NEEDS Skin     Verbally Other (Comment) (Appropriate for ethnicity,dry,Abrasion,groin,hand,non-tenting)                       Personal Care Assistance Level of Assistance  Bathing, Feeding, Dressing Bathing Assistance: Maximum assistance Feeding assistance: Independent Dressing Assistance: Maximum assistance     Functional Limitations  Info  Hearing, Speech   Hearing Info: Impaired Speech Info: Adequate    SPECIAL CARE FACTORS FREQUENCY                       Contractures Contractures Info: Not present    Additional Factors Info  Code Status,  Allergies, Insulin Sliding Scale Code Status Info: FULL Allergies Info: Duloxetine Hcl,Statins,Amitriptyline,Benadryl (diphenhydramine Hcl (sleep)),Ezetimibe,Gabapentin,Metformin Hcl,Other,Sitagliptin,Tylenol (acetaminophen),Colesevelam Hcl   Insulin Sliding Scale Info: insulin aspart (novoLOG) injection 0-9 Units 3 times daily with meals,insulin aspart (novoLOG) injection 5 Units 2 times daily with meals       Current Medications (06/12/2022):  This is the current hospital active medication list Current Facility-Administered Medications  Medication Dose Route Frequency Provider Last Rate Last Admin   acetaminophen (TYLENOL) tablet 650 mg  650 mg Oral Q6H PRN Clydie Braun, MD       Or   acetaminophen (TYLENOL) suppository 650 mg  650 mg Rectal Q6H PRN Madelyn Flavors A, MD       albuterol (PROVENTIL) (2.5 MG/3ML) 0.083% nebulizer solution 2.5 mg  2.5 mg Nebulization Q6H PRN Katrinka Blazing, Rondell A, MD       amLODipine (NORVASC) tablet 10 mg  10 mg Oral Daily Smith, Rondell A, MD   10 mg at 06/12/22 0930   apixaban (ELIQUIS) tablet 5 mg  5 mg Oral BID Madelyn Flavors A, MD   5 mg at 06/12/22 0930   bisacodyl (DULCOLAX) suppository 10 mg  10 mg Rectal Daily PRN Madelyn Flavors A, MD       carbidopa-levodopa (SINEMET IR) 25-100 MG per tablet immediate release 1.5 tablet  1.5 tablet Oral QHS Smith, Rondell A, MD   1.5 tablet at 06/11/22 2027   cefTRIAXone (ROCEPHIN) 1 g in sodium chloride 0.9 % 100 mL IVPB  1 g Intravenous Q24H Clydie Braun, MD   Stopped at 06/11/22 2059   hydrALAZINE (APRESOLINE) tablet 25 mg  25 mg Oral TID Madelyn Flavors A, MD   25 mg at 06/12/22 0929   insulin aspart (novoLOG) injection 0-9 Units  0-9 Units Subcutaneous TID WC Smith, Rondell A, MD   2 Units at 06/12/22 1240   insulin aspart (novoLOG) injection 5 Units  5 Units Subcutaneous BID WC Madelyn Flavors A, MD   5 Units at 06/12/22 1240   insulin glargine-yfgn (SEMGLEE) injection 20 Units  20 Units Subcutaneous Daily Madelyn Flavors A, MD   20 Units at 06/12/22 0928   isosorbide mononitrate (IMDUR) 24 hr tablet 30 mg  30 mg Oral Daily Katrinka Blazing, Rondell A, MD   30 mg at 06/12/22 0929   levothyroxine (SYNTHROID) tablet 112 mcg  112 mcg Oral QAC breakfast Madelyn Flavors A, MD   112 mcg at 06/12/22 0556   meclizine (ANTIVERT) tablet 25 mg  25 mg Oral Daily Katrinka Blazing, Rondell A, MD   25 mg at 06/12/22 0929   mupirocin ointment (BACTROBAN) 2 % 1 Application  1 Application Topical BID Clydie Braun, MD   1 Application at 06/12/22 0931   nystatin (MYCOSTATIN/NYSTOP) topical powder 1 Application  1 Application Topical BID Madelyn Flavors A, MD   1 Application at 06/12/22 0931   ondansetron (ZOFRAN) tablet 4 mg  4 mg Oral Q6H PRN Madelyn Flavors A, MD       Or   ondansetron (ZOFRAN) injection 4 mg  4 mg Intravenous Q6H PRN Madelyn Flavors A, MD   4 mg at 06/09/22 2253   oxyCODONE (Oxy IR/ROXICODONE)  immediate release tablet 5 mg  5 mg Oral Q8H PRN Fuller Plan A, MD   5 mg at 06/10/22 2145   pneumococcal 20-valent conjugate vaccine (PREVNAR 20) injection 0.5 mL  0.5 mL Intramuscular Tomorrow-1000 Smith, Rondell A, MD       polyethylene glycol (MIRALAX / GLYCOLAX) packet 17 g  17 g Oral Daily Florencia Reasons, MD       pregabalin (LYRICA) capsule 50 mg  50 mg Oral BID Tamala Julian, Rondell A, MD   50 mg at 06/12/22 0930   saccharomyces boulardii (FLORASTOR) capsule 250 mg  250 mg Oral Daily Smith, Rondell A, MD   250 mg at 06/12/22 0932   senna-docusate (Senokot-S) tablet 1 tablet  1 tablet Oral BID Florencia Reasons, MD       sodium chloride flush (NS) 0.9 % injection 3 mL  3 mL Intravenous Q12H Smith, Rondell A, MD   3 mL at 06/12/22 0932     Discharge Medications: Please see discharge summary for a list of discharge medications.  Relevant Imaging Results:  Relevant Lab Results:   Additional Information [phone]  Milas Gain, LCSWA

## 2022-06-12 NOTE — Progress Notes (Signed)
PT Cancellation Note  Patient Details Name: Sierra Wright MRN: [id number] DOB: 30-Apr-1934   Cancelled Treatment:    Reason Eval/Treat Not Completed: Noted patient awaiting transport back to SNF this afternoon; will defer treatment.  Ina Homes, PT, DPT Acute Rehabilitation Services  Personal: Secure Chat Rehab Office: [phone]  Malachy Chamber 06/12/2022, 1:32 PM

## 2022-06-12 NOTE — Plan of Care (Signed)
  Problem: Education: Goal: Ability to describe self-care measures that may prevent or decrease complications (Diabetes Survival Skills Education) will improve Outcome: Progressing Goal: Individualized Educational Video(s) Outcome: Progressing   Problem: Coping: Goal: Ability to adjust to condition or change in health will improve Outcome: Progressing   Problem: Fluid Volume: Goal: Ability to maintain a balanced intake and output will improve Outcome: Progressing   Problem: Health Behavior/Discharge Planning: Goal: Ability to identify and utilize available resources and services will improve Outcome: Progressing Goal: Ability to manage health-related needs will improve Outcome: Progressing   Problem: Metabolic: Goal: Ability to maintain appropriate glucose levels will improve Outcome: Progressing   Problem: Nutritional: Goal: Maintenance of adequate nutrition will improve Outcome: Progressing Goal: Progress toward achieving an optimal weight will improve Outcome: Progressing   Problem: Skin Integrity: Goal: Risk for impaired skin integrity will decrease Outcome: Progressing   Problem: Tissue Perfusion: Goal: Adequacy of tissue perfusion will improve Outcome: Progressing   Problem: Education: Goal: Knowledge of General Education information will improve Description: Including pain rating scale, medication(s)/side effects and non-pharmacologic comfort measures Outcome: Progressing   Problem: Health Behavior/Discharge Planning: Goal: Ability to manage health-related needs will improve Outcome: Progressing   Problem: Clinical Measurements: Goal: Ability to maintain clinical measurements within normal limits will improve Outcome: Progressing Goal: Will remain free from infection Outcome: Progressing Goal: Diagnostic test results will improve Outcome: Progressing Goal: Respiratory complications will improve Outcome: Progressing Goal: Cardiovascular complication will  be avoided Outcome: Progressing   Problem: Activity: Goal: Risk for activity intolerance will decrease Outcome: Progressing   Problem: Nutrition: Goal: Adequate nutrition will be maintained Outcome: Progressing   Problem: Coping: Goal: Level of anxiety will decrease Outcome: Progressing   Problem: Elimination: Goal: Will not experience complications related to bowel motility Outcome: Progressing Goal: Will not experience complications related to urinary retention Outcome: Progressing   Problem: Pain Managment: Goal: General experience of comfort will improve Outcome: Progressing   Problem: Safety: Goal: Ability to remain free from injury will improve Outcome: Progressing   Problem: Skin Integrity: Goal: Risk for impaired skin integrity will decrease Outcome: Progressing   Problem: Education: Goal: Ability to demonstrate appropriate child care will improve Outcome: Progressing Goal: Ability to verbalize an understanding of newborn treatment and procedures will improve Outcome: Progressing Goal: Ability to demonstrate an understanding of appropriate nutrition and feeding will improve Outcome: Progressing Goal: Individualized Educational Video(s) Outcome: Progressing   Problem: Nutritional: Goal: Nutritional status of the infant will improve as evidenced by minimal weight loss and appropriate weight gain for gestational age Outcome: Progressing Goal: Ability to maintain a balanced intake and output will improve Outcome: Progressing   Problem: Clinical Measurements: Goal: Ability to maintain clinical measurements within normal limits will improve Outcome: Progressing   Problem: Skin Integrity: Goal: Risk for impaired skin integrity will decrease Outcome: Progressing Goal: Demonstrates signs of wound healing without infection Outcome: Progressing   

## 2022-06-12 NOTE — Discharge Summary (Signed)
Physician Discharge Summary   Patient: Sierra Wright MRN: [id number] DOB: 08/30/1934  Admit date:     06/09/2022  Discharge date: 06/12/22  Discharge Physician: Kathlen ModyVijaya Keionna Kinnaird   PCP: Mila PalmerWolters, Sharon, MD   Recommendations at discharge:  Please follow up with cardiology as needed.  Please follow up with PCP in one week.   Discharge Diagnoses: Principal Problem:   Bradycardia Active Problems:   Paroxysmal atrial fibrillation (HCC)   Acute kidney injury superimposed on chronic kidney disease (HCC)   Acute lower UTI   Elevated troponin   Essential hypertension   Type 2 diabetes mellitus with hemoglobin A1c goal of less than 7.5% [facility](HCC)   Hypothyroidism    Hospital Course: Sierra MajorSarah L Threat is a 88 y.o. female with medical history significant of paroxysmal atrial fibrillation on Eliquis, CAD s/p CABG, HFpEF, diabetes mellitus type 2, hypothyroidism, RLS who presented from SNF due to slow heart rates.  She had been at the rehab after suffering ankle fractures last year.  She spends most of her time in a wheelchair due to issues with her ankles as well as knees.  On admission to the ED, heart rates were in the 20s to 80s.  Work-up revealed bradycardia, Mobitz 1 and intermittent 2-1 AV block for which cardiology recommended to hold off on home beta-blocker.  Seen by cardiology and electrophysiology.  Electrophysiology signed off, recommended to continue to hold AV nodal blockers, cardiac monitor at discharge, and follow-up with electrophysiology outpatient.  Assessment and Plan:   Afib, present with bradycardia Mobitz 1 and intermittent 2-1 AV block Continue to hold home metoprolol  Seen by cardiology and electrophysiology.  Electrophysiology signed off, recommended to continue to hold AV nodal blockers, cardiac monitor at discharge, and follow-up with electrophysiology outpatient. Heart rate ranging between 30"s and 140's on 06/11/2022   Klebsiella pneumonia UTI, POA Resistant to  ampicillin and Macrobid Rocephin started on 06/09/2022. Transition to oral antibiotics on discharge.    Insulin-dependent type 2 diabetes with hyper glycemia Continue long acting insulin, short acting insulin and insulin sliding scale. Last hemoglobin A1c 8.0 on 04/16/2022.   Hypothyroidism Continue home levothyroxine.   Constipation RN reports patient has diarrhea, kub showed solid stool in bowel, likely overflow diarrhea in the setting on constipation, need to treat constipation.   On Senokot 1 tablet twice daily   Wheelchair to bedbound due to bilateral ankle fractures Non-surgical management recommended at the time.    Obesity BMI 38 Recommend healthy dieting and regular physical activity.        Consultants: cardiology.  Procedures performed: echo  Disposition: Skilled nursing facility Diet recommendation:  Discharge Diet Orders (From admission, onward)     Start     Ordered   06/12/22 0000  Diet - low sodium heart healthy        06/12/22 1306           Carb modified diet DISCHARGE MEDICATION: Allergies as of 06/12/2022       Reactions   Duloxetine Hcl Nausea And Vomiting, Other (See Comments)   Statins Other (See Comments)   Causes myalgias Other reaction(s): Unknown   Amitriptyline Other (See Comments)   Pt. States that it caused it to be suicidal.    Benadryl [diphenhydramine Hcl (sleep)] Other (See Comments)   Makes the patient very "hyper"   Ezetimibe    Other reaction(s): Unknown   Gabapentin Other (See Comments)   Metformin Hcl    Other reaction(s): Unknown   Other  Other reaction(s): Unknown   Sitagliptin    Other reaction(s): Unknown   Tylenol [acetaminophen] Nausea Only   Colesevelam Hcl Other (See Comments)   Causes dizziness Other reaction(s): Unknown        Medication List     STOP taking these medications    benazepril 10 MG tablet Commonly known as: LOTENSIN   oxyCODONE 5 MG immediate release tablet Commonly known as:  Roxicodone       TAKE these medications    albuterol (2.5 MG/3ML) 0.083% nebulizer solution Commonly known as: PROVENTIL Take 2.5 mg by nebulization every 6 (six) hours as needed for wheezing or shortness of breath.   amLODipine 10 MG tablet Commonly known as: NORVASC Take 10 mg by mouth daily.   apixaban 5 MG Tabs tablet Commonly known as: ELIQUIS Take 1 tablet (5 mg total) by mouth 2 (two) times daily.   bisacodyl 10 MG suppository Commonly known as: DULCOLAX Place 10 mg rectally daily as needed for moderate constipation (if not relieved by MOM).   carbidopa-levodopa 25-100 MG tablet Commonly known as: SINEMET IR Take 1.5 tablets by mouth at bedtime.   cephALEXin 500 MG capsule Commonly known as: KEFLEX Take 1 capsule (500 mg total) by mouth 2 (two) times daily for 3 days.   cholecalciferol 25 MCG (1000 UNIT) tablet Commonly known as: VITAMIN D3 Take 1,000 Units by mouth daily.   FreeStyle [place] 2 Reader [employer] 2 Sensor Misc   furosemide 40 MG tablet Commonly known as: LASIX Take 1 tablet (40 mg total) by mouth daily.   hydrALAZINE 25 MG tablet Commonly known as: APRESOLINE TAKE 1 TABLET BY MOUTH THREE TIMES DAILY   insulin lispro 100 UNIT/ML KwikPen Commonly known as: HUMALOG Inject 5 Units into the skin 2 (two) times daily. Before lunch and dinner   isosorbide mononitrate 30 MG 24 hr tablet Commonly known as: IMDUR Take 1 tablet by mouth once daily   Lantus SoloStar 100 UNIT/ML Solostar Pen Generic drug: insulin glargine Inject 28 Units into the skin daily.   levothyroxine 112 MCG tablet Commonly known as: SYNTHROID Take 112 mcg by mouth daily before breakfast.   meclizine 25 MG tablet Commonly known as: ANTIVERT Take 25 mg by mouth daily.   MILK OF MAGNESIA PO Take 30 mLs by mouth daily as needed (constipation).   mupirocin ointment 2 % Commonly known as: BACTROBAN Apply 1 Application topically 2 (two) times daily. TO LESION  ON DORSAL RIGHT HAND   nitroGLYCERIN 0.4 MG SL tablet Commonly known as: NITROSTAT DISSOLVE ONE TABLET UNDER THE TONGUE EVERY 5 MINUTES AS NEEDED FOR CHEST PAIN. What changed: See the new instructions.   NON FORMULARY Apply 1 Application topically 2 (two) times daily. To back and other affected area - Anti-Itch Lotion 0.5%-0.5%   nystatin powder Commonly known as: MYCOSTATIN/NYSTOP APPLY TOPICALLY TO BUTTOCKS AND GROIN FOLLOWED BY A THIN LAYER OF BARRIER CREAM TWICE A DAY   Nyamyc powder Generic drug: nystatin Apply 1 Application topically 2 (two) times daily. Apply to buttocks and groin followed by a thin layer of barrier cream.   ondansetron 4 MG tablet Commonly known as: ZOFRAN Take 4 mg by mouth every 6 (six) hours as needed for nausea.   Oyster Shell Calcium 500 MG Tabs Take 500 mg by mouth daily. TAKE 1 TABLET BY MOUTH ONCE DAILY FOR SUPPLEMENT   pregabalin 50 MG capsule Commonly known as: LYRICA Take 1 capsule (50 mg total) by mouth 2 (two) times  daily.   RA SALINE ENEMA RE Place 1 Dose rectally daily as needed (constipation not relieved by bisacodyl).   saccharomyces boulardii 250 MG capsule Commonly known as: FLORASTOR Take 250 mg by mouth daily.   vitamin B-12 1000 MCG tablet Commonly known as: CYANOCOBALAMIN Take 1,000 mcg by mouth daily.   vitamin C 1000 MG tablet Take 1,000 mg by mouth daily.        Discharge Exam: Filed Weights   06/10/22 0446 06/11/22 0500 06/12/22 0552  Weight: 102.5 kg 104.5 kg 102.8 kg   General exam: Appears calm and comfortable  Respiratory system: Clear to auscultation. Respiratory effort normal. Cardiovascular system: S1 & S2 heard, bradycardic,  No pedal edema. Gastrointestinal system: Abdomen is nondistended, soft and nontender. Normal bowel sounds heard. Central nervous system: Alert and oriented. No focal neurological deficits. Extremities: Symmetric 5 x 5 power. Skin: No rashes, lesions or ulcers Psychiatry:  Judgement and insight appear normal. Mood & affect appropriate.    Condition at discharge: fair  The results of significant diagnostics from this hospitalization (including imaging, microbiology, ancillary and laboratory) are listed below for reference.   Imaging Studies: DG Abd 1 View  Result Date: 06/10/2022 CLINICAL DATA:  Generalized abdominal pain and distension. EXAM: ABDOMEN - 1 VIEW COMPARISON:  Abdominal x-ray 10/20/2021 FINDINGS: The bowel gas pattern is normal. Stool burden is average. No radio-opaque calculi. There are surgical clips in the upper abdomen and right upper quadrant. Vascular calcifications are seen in the abdomen and pelvis as well as lower extremities. There are severe degenerative changes of the left hip. Degenerative changes also affect the lumbar spine. IMPRESSION: 1. Nonobstructive bowel gas pattern. Electronically Signed   By: Darliss Cheney M.D.   On: 06/10/2022 15:36   ECHOCARDIOGRAM COMPLETE  Result Date: 06/10/2022    ECHOCARDIOGRAM REPORT   Patient Name:   ANYIAH COVERDALE Date of Exam: 06/10/2022 Medical Rec #:  [id number]        Height:       65.0 in Accession #:    [id number]       Weight:       226.0 lb Date of Birth:  06/03/1934         BSA:          2.083 m Patient Age:    88 years         BP:           133/55 mmHg Patient Gender: F                HR:           79 bpm. Exam Location:  Inpatient Procedure: 2D Echo, Cardiac Doppler, Color Doppler and Intracardiac            Opacification Agent Indications:    Heart block, 2nd degree I44.1  History:        Patient has prior history of Echocardiogram examinations, most                 recent 08/12/2018. CAD, Prior CABG; Risk Factors:Diabetes and                 Hypertension.  Sonographer:    Leta Jungling RDCS Referring Phys: Sande Rives IMPRESSIONS  1. Left ventricular ejection fraction, by estimation, is 65 to 70%. The left ventricle has normal function. The left ventricle has no regional wall motion  abnormalities. There is mild left ventricular hypertrophy. Left ventricular diastolic parameters are indeterminate.  2.  Right ventricular systolic function is normal. The right ventricular size is normal.  3. Left atrial size was mildly dilated.  4. The mitral valve is normal in structure. Trivial mitral valve regurgitation. No evidence of mitral stenosis. Moderate mitral annular calcification.  5. The aortic valve is normal in structure. Aortic valve regurgitation is not visualized. No aortic stenosis is present.  6. The inferior vena cava is normal in size with greater than 50% respiratory variability, suggesting right atrial pressure of 3 mmHg. FINDINGS  Left Ventricle: Left ventricular ejection fraction, by estimation, is 65 to 70%. The left ventricle has normal function. The left ventricle has no regional wall motion abnormalities. Definity contrast agent was given IV to delineate the left ventricular  endocardial borders. The left ventricular internal cavity size was normal in size. There is mild left ventricular hypertrophy. Left ventricular diastolic parameters are indeterminate. Right Ventricle: The right ventricular size is normal. No increase in right ventricular wall thickness. Right ventricular systolic function is normal. Left Atrium: Left atrial size was mildly dilated. Right Atrium: Right atrial size was normal in size. Pericardium: There is no evidence of pericardial effusion. Mitral Valve: The mitral valve is normal in structure. Moderate mitral annular calcification. Trivial mitral valve regurgitation. No evidence of mitral valve stenosis. Tricuspid Valve: The tricuspid valve is normal in structure. Tricuspid valve regurgitation is mild . No evidence of tricuspid stenosis. Aortic Valve: The aortic valve is normal in structure. Aortic valve regurgitation is not visualized. No aortic stenosis is present. Pulmonic Valve: The pulmonic valve was normal in structure. Pulmonic valve regurgitation is  trivial. No evidence of pulmonic stenosis. Aorta: The aortic root is normal in size and structure. Venous: The inferior vena cava is normal in size with greater than 50% respiratory variability, suggesting right atrial pressure of 3 mmHg. IAS/Shunts: No atrial level shunt detected by color flow Doppler.  LEFT VENTRICLE PLAX 2D LVIDd:         4.60 cm   Diastology LVIDs:         3.00 cm   LV e' medial:    11.75 cm/s LV PW:         0.90 cm   LV E/e' medial:  15.2 LV IVS:        1.20 cm   LV e' lateral:   20.65 cm/s LVOT diam:     2.10 cm   LV E/e' lateral: 8.7 LV SV:         67 LV SV Index:   32 LVOT Area:     3.46 cm  RIGHT VENTRICLE RV S prime:     5.77 cm/s TAPSE (M-mode): 1.0 cm LEFT ATRIUM             Index        RIGHT ATRIUM           Index LA diam:        4.10 cm 1.97 cm/m   RA Area:     20.30 cm LA Vol (A2C):   79.8 ml 38.30 ml/m  RA Volume:   54.10 ml  25.97 ml/m LA Vol (A4C):   67.0 ml 32.13 ml/m LA Biplane Vol: 65.9 ml 31.63 ml/m  AORTIC VALVE LVOT Vmax:   90.40 cm/s LVOT Vmean:  64.300 cm/s LVOT VTI:    0.194 m  AORTA Ao Root diam: 3.00 cm Ao Asc diam:  3.40 cm MITRAL VALVE MV Area (PHT): 4.72 cm     SHUNTS MV Decel Time: 161 msec  Systemic VTI:  0.19 m MV E velocity: 179.00 cm/s  Systemic Diam: 2.10 cm Donato Schultz MD Electronically signed by Donato Schultz MD Signature Date/Time: 06/10/2022/12:33:16 PM    Final    DG Chest Portable 1 View  Result Date: 06/09/2022 CLINICAL DATA:  Difficulty breathing EXAM: PORTABLE CHEST 1 VIEW COMPARISON:  02/13/2022 FINDINGS: Cardiac shadow is enlarged but stable. Postsurgical changes are again seen. Aortic calcifications are noted. The lungs are well aerated bilaterally. Minimal scarring is again seen in the left base. No bony abnormality is seen. IMPRESSION: Mild scarring in the left base. Electronically Signed   By: Alcide Clever M.D.   On: 06/09/2022 02:55    Microbiology: Results for orders placed or performed during the hospital encounter of 06/09/22   MRSA Next Gen by PCR, Nasal     Status: None   Collection Time: 06/09/22  4:19 PM   Specimen: Urine, Clean Catch; Nasal Swab  Result Value Ref Range Status   MRSA by PCR Next Gen NOT DETECTED NOT DETECTED Final    Comment: (NOTE) The GeneXpert MRSA Assay (FDA approved for NASAL specimens only), is one component of a comprehensive MRSA colonization surveillance program. It is not intended to diagnose MRSA infection nor to guide or monitor treatment for MRSA infections. Test performance is not FDA approved in patients less than 4 years old. Performed at [facility], [address], [place], [place] [address]   Urine Culture     Status: Abnormal   Collection Time: 06/09/22  5:28 PM   Specimen: Urine, Clean Catch  Result Value Ref Range Status   Specimen Description URINE, CLEAN CATCH  Final   Special Requests   Final    NONE Performed at [facility], [address], [place], [place] [address]    Culture >=100,000 COLONIES/mL KLEBSIELLA PNEUMONIAE (A)  Final   Report Status 06/11/2022 FINAL  Final   Organism ID, Bacteria KLEBSIELLA PNEUMONIAE (A)  Final      Susceptibility   Klebsiella pneumoniae - MIC*    AMPICILLIN >=32 RESISTANT Resistant     CEFAZOLIN <=4 SENSITIVE Sensitive     CEFEPIME <=0.12 SENSITIVE Sensitive     CEFTRIAXONE <=0.25 SENSITIVE Sensitive     CIPROFLOXACIN <=0.25 SENSITIVE Sensitive     GENTAMICIN <=1 SENSITIVE Sensitive     IMIPENEM <=0.25 SENSITIVE Sensitive     NITROFURANTOIN 64 INTERMEDIATE Intermediate     TRIMETH/SULFA <=20 SENSITIVE Sensitive     AMPICILLIN/SULBACTAM 8 SENSITIVE Sensitive     PIP/TAZO <=4 SENSITIVE Sensitive     * >=100,000 COLONIES/mL KLEBSIELLA PNEUMONIAE    Labs: CBC: Recent Labs  Lab 06/09/22 0249 06/10/22 0108  WBC 10.1 9.5  NEUTROABS 5.9  --   HGB 13.0 12.6  HCT 40.4 38.3  MCV 94.0 91.6  PLT 200 189   Basic Metabolic Panel: Recent Labs  Lab 06/09/22 0249 06/10/22 0108 06/11/22 0140  06/12/22 1150  NA 139 137 136 138  K 3.8 4.0 4.0 3.7  CL 104 102 102 105  CO2 24 25 25 25   GLUCOSE 118* 157* 173* 170*  BUN 33* 25* 28* 20  CREATININE 1.35* 1.14* 1.37* 0.94  CALCIUM 9.0 8.5* 8.6* 9.4  MG 1.9 1.9  --   --    Liver Function Tests: Recent Labs  Lab 06/09/22 0249  AST 20  ALT 7  ALKPHOS 80  BILITOT 0.6  PROT 5.8*  ALBUMIN 3.2*   CBG: Recent Labs  Lab 06/11/22 1141 06/11/22  1645 06/11/22 2134 06/12/22 0743 06/12/22 1114  GLUCAP 172* 164* 174* 249* 198*    Discharge time spent: 48 minutes.   Signed: Kathlen Mody, MD [facility] 06/12/2022

## 2022-06-13 DIAGNOSIS — R001 Bradycardia, unspecified: Secondary | ICD-10-CM | POA: Diagnosis not present

## 2022-06-14 DIAGNOSIS — S82842D Displaced bimalleolar fracture of left lower leg, subsequent encounter for closed fracture with routine healing: Secondary | ICD-10-CM | POA: Diagnosis not present

## 2022-06-14 DIAGNOSIS — M1990 Unspecified osteoarthritis, unspecified site: Secondary | ICD-10-CM | POA: Diagnosis not present

## 2022-06-14 DIAGNOSIS — M6259 Muscle wasting and atrophy, not elsewhere classified, multiple sites: Secondary | ICD-10-CM | POA: Diagnosis not present

## 2022-06-14 DIAGNOSIS — M6281 Muscle weakness (generalized): Secondary | ICD-10-CM | POA: Diagnosis not present

## 2022-06-14 DIAGNOSIS — Z741 Need for assistance with personal care: Secondary | ICD-10-CM | POA: Diagnosis not present

## 2022-06-14 DIAGNOSIS — I639 Cerebral infarction, unspecified: Secondary | ICD-10-CM | POA: Diagnosis not present

## 2022-06-14 DIAGNOSIS — R2689 Other abnormalities of gait and mobility: Secondary | ICD-10-CM | POA: Diagnosis not present

## 2022-06-14 DIAGNOSIS — R279 Unspecified lack of coordination: Secondary | ICD-10-CM | POA: Diagnosis not present

## 2022-06-14 DIAGNOSIS — I739 Peripheral vascular disease, unspecified: Secondary | ICD-10-CM | POA: Diagnosis not present

## 2022-06-14 DIAGNOSIS — I5032 Chronic diastolic (congestive) heart failure: Secondary | ICD-10-CM | POA: Diagnosis not present

## 2022-06-14 DIAGNOSIS — I2510 Atherosclerotic heart disease of native coronary artery without angina pectoris: Secondary | ICD-10-CM | POA: Diagnosis not present

## 2022-06-15 DIAGNOSIS — M6281 Muscle weakness (generalized): Secondary | ICD-10-CM | POA: Diagnosis not present

## 2022-06-15 DIAGNOSIS — R279 Unspecified lack of coordination: Secondary | ICD-10-CM | POA: Diagnosis not present

## 2022-06-15 DIAGNOSIS — M1990 Unspecified osteoarthritis, unspecified site: Secondary | ICD-10-CM | POA: Diagnosis not present

## 2022-06-15 DIAGNOSIS — R2689 Other abnormalities of gait and mobility: Secondary | ICD-10-CM | POA: Diagnosis not present

## 2022-06-15 DIAGNOSIS — M6259 Muscle wasting and atrophy, not elsewhere classified, multiple sites: Secondary | ICD-10-CM | POA: Diagnosis not present

## 2022-06-15 DIAGNOSIS — Z741 Need for assistance with personal care: Secondary | ICD-10-CM | POA: Diagnosis not present

## 2022-06-15 DIAGNOSIS — I5032 Chronic diastolic (congestive) heart failure: Secondary | ICD-10-CM | POA: Diagnosis not present

## 2022-06-18 DIAGNOSIS — M1990 Unspecified osteoarthritis, unspecified site: Secondary | ICD-10-CM | POA: Diagnosis not present

## 2022-06-18 DIAGNOSIS — M6281 Muscle weakness (generalized): Secondary | ICD-10-CM | POA: Diagnosis not present

## 2022-06-18 DIAGNOSIS — R2689 Other abnormalities of gait and mobility: Secondary | ICD-10-CM | POA: Diagnosis not present

## 2022-06-18 DIAGNOSIS — Z741 Need for assistance with personal care: Secondary | ICD-10-CM | POA: Diagnosis not present

## 2022-06-18 DIAGNOSIS — R279 Unspecified lack of coordination: Secondary | ICD-10-CM | POA: Diagnosis not present

## 2022-06-18 DIAGNOSIS — M6259 Muscle wasting and atrophy, not elsewhere classified, multiple sites: Secondary | ICD-10-CM | POA: Diagnosis not present

## 2022-06-18 DIAGNOSIS — I5032 Chronic diastolic (congestive) heart failure: Secondary | ICD-10-CM | POA: Diagnosis not present

## 2022-06-19 DIAGNOSIS — I5032 Chronic diastolic (congestive) heart failure: Secondary | ICD-10-CM | POA: Diagnosis not present

## 2022-06-19 DIAGNOSIS — R2689 Other abnormalities of gait and mobility: Secondary | ICD-10-CM | POA: Diagnosis not present

## 2022-06-19 DIAGNOSIS — M1990 Unspecified osteoarthritis, unspecified site: Secondary | ICD-10-CM | POA: Diagnosis not present

## 2022-06-19 DIAGNOSIS — M6281 Muscle weakness (generalized): Secondary | ICD-10-CM | POA: Diagnosis not present

## 2022-06-19 DIAGNOSIS — M6259 Muscle wasting and atrophy, not elsewhere classified, multiple sites: Secondary | ICD-10-CM | POA: Diagnosis not present

## 2022-06-19 DIAGNOSIS — R279 Unspecified lack of coordination: Secondary | ICD-10-CM | POA: Diagnosis not present

## 2022-06-19 DIAGNOSIS — Z741 Need for assistance with personal care: Secondary | ICD-10-CM | POA: Diagnosis not present

## 2022-06-20 DIAGNOSIS — M6259 Muscle wasting and atrophy, not elsewhere classified, multiple sites: Secondary | ICD-10-CM | POA: Diagnosis not present

## 2022-06-20 DIAGNOSIS — R2689 Other abnormalities of gait and mobility: Secondary | ICD-10-CM | POA: Diagnosis not present

## 2022-06-20 DIAGNOSIS — M6281 Muscle weakness (generalized): Secondary | ICD-10-CM | POA: Diagnosis not present

## 2022-06-20 DIAGNOSIS — I5032 Chronic diastolic (congestive) heart failure: Secondary | ICD-10-CM | POA: Diagnosis not present

## 2022-06-20 DIAGNOSIS — Z741 Need for assistance with personal care: Secondary | ICD-10-CM | POA: Diagnosis not present

## 2022-06-20 DIAGNOSIS — R279 Unspecified lack of coordination: Secondary | ICD-10-CM | POA: Diagnosis not present

## 2022-06-20 DIAGNOSIS — M1990 Unspecified osteoarthritis, unspecified site: Secondary | ICD-10-CM | POA: Diagnosis not present

## 2022-06-21 DIAGNOSIS — M6281 Muscle weakness (generalized): Secondary | ICD-10-CM | POA: Diagnosis not present

## 2022-06-21 DIAGNOSIS — Z741 Need for assistance with personal care: Secondary | ICD-10-CM | POA: Diagnosis not present

## 2022-06-21 DIAGNOSIS — R2689 Other abnormalities of gait and mobility: Secondary | ICD-10-CM | POA: Diagnosis not present

## 2022-06-21 DIAGNOSIS — M1990 Unspecified osteoarthritis, unspecified site: Secondary | ICD-10-CM | POA: Diagnosis not present

## 2022-06-21 DIAGNOSIS — R279 Unspecified lack of coordination: Secondary | ICD-10-CM | POA: Diagnosis not present

## 2022-06-21 DIAGNOSIS — I5032 Chronic diastolic (congestive) heart failure: Secondary | ICD-10-CM | POA: Diagnosis not present

## 2022-06-21 DIAGNOSIS — M6259 Muscle wasting and atrophy, not elsewhere classified, multiple sites: Secondary | ICD-10-CM | POA: Diagnosis not present

## 2022-06-22 ENCOUNTER — Other Ambulatory Visit: Payer: Self-pay | Admitting: *Deleted

## 2022-06-22 DIAGNOSIS — R2689 Other abnormalities of gait and mobility: Secondary | ICD-10-CM | POA: Diagnosis not present

## 2022-06-22 DIAGNOSIS — M6281 Muscle weakness (generalized): Secondary | ICD-10-CM | POA: Diagnosis not present

## 2022-06-22 DIAGNOSIS — I5032 Chronic diastolic (congestive) heart failure: Secondary | ICD-10-CM | POA: Diagnosis not present

## 2022-06-22 DIAGNOSIS — M1990 Unspecified osteoarthritis, unspecified site: Secondary | ICD-10-CM | POA: Diagnosis not present

## 2022-06-22 DIAGNOSIS — Z741 Need for assistance with personal care: Secondary | ICD-10-CM | POA: Diagnosis not present

## 2022-06-22 DIAGNOSIS — R279 Unspecified lack of coordination: Secondary | ICD-10-CM | POA: Diagnosis not present

## 2022-06-22 DIAGNOSIS — M6259 Muscle wasting and atrophy, not elsewhere classified, multiple sites: Secondary | ICD-10-CM | POA: Diagnosis not present

## 2022-06-22 NOTE — Patient Outreach (Signed)
Per [facility] eligible member currently resides in  [facility]. Verified in [facility] Mrs Czerwinski is long term resident   No identifiable care coordination/care management needs.    Raiford Noble, MSN, RN,BSN [facility] Care Coordinator [phone] [facility]) [phone]  (Toll free office)

## 2022-06-25 DIAGNOSIS — M6281 Muscle weakness (generalized): Secondary | ICD-10-CM | POA: Diagnosis not present

## 2022-06-25 DIAGNOSIS — R279 Unspecified lack of coordination: Secondary | ICD-10-CM | POA: Diagnosis not present

## 2022-06-25 DIAGNOSIS — M6259 Muscle wasting and atrophy, not elsewhere classified, multiple sites: Secondary | ICD-10-CM | POA: Diagnosis not present

## 2022-06-25 DIAGNOSIS — Z741 Need for assistance with personal care: Secondary | ICD-10-CM | POA: Diagnosis not present

## 2022-06-25 DIAGNOSIS — R2689 Other abnormalities of gait and mobility: Secondary | ICD-10-CM | POA: Diagnosis not present

## 2022-06-25 DIAGNOSIS — I5032 Chronic diastolic (congestive) heart failure: Secondary | ICD-10-CM | POA: Diagnosis not present

## 2022-06-25 DIAGNOSIS — M1990 Unspecified osteoarthritis, unspecified site: Secondary | ICD-10-CM | POA: Diagnosis not present

## 2022-06-26 DIAGNOSIS — M6281 Muscle weakness (generalized): Secondary | ICD-10-CM | POA: Diagnosis not present

## 2022-06-26 DIAGNOSIS — Z741 Need for assistance with personal care: Secondary | ICD-10-CM | POA: Diagnosis not present

## 2022-06-26 DIAGNOSIS — M1990 Unspecified osteoarthritis, unspecified site: Secondary | ICD-10-CM | POA: Diagnosis not present

## 2022-06-26 DIAGNOSIS — R279 Unspecified lack of coordination: Secondary | ICD-10-CM | POA: Diagnosis not present

## 2022-06-26 DIAGNOSIS — M6259 Muscle wasting and atrophy, not elsewhere classified, multiple sites: Secondary | ICD-10-CM | POA: Diagnosis not present

## 2022-06-26 DIAGNOSIS — I5032 Chronic diastolic (congestive) heart failure: Secondary | ICD-10-CM | POA: Diagnosis not present

## 2022-06-26 DIAGNOSIS — R2689 Other abnormalities of gait and mobility: Secondary | ICD-10-CM | POA: Diagnosis not present

## 2022-06-27 DIAGNOSIS — R279 Unspecified lack of coordination: Secondary | ICD-10-CM | POA: Diagnosis not present

## 2022-06-27 DIAGNOSIS — R2689 Other abnormalities of gait and mobility: Secondary | ICD-10-CM | POA: Diagnosis not present

## 2022-06-27 DIAGNOSIS — Z741 Need for assistance with personal care: Secondary | ICD-10-CM | POA: Diagnosis not present

## 2022-06-27 DIAGNOSIS — M6281 Muscle weakness (generalized): Secondary | ICD-10-CM | POA: Diagnosis not present

## 2022-06-27 DIAGNOSIS — M1990 Unspecified osteoarthritis, unspecified site: Secondary | ICD-10-CM | POA: Diagnosis not present

## 2022-06-27 DIAGNOSIS — M6259 Muscle wasting and atrophy, not elsewhere classified, multiple sites: Secondary | ICD-10-CM | POA: Diagnosis not present

## 2022-06-27 DIAGNOSIS — I5032 Chronic diastolic (congestive) heart failure: Secondary | ICD-10-CM | POA: Diagnosis not present

## 2022-06-28 DIAGNOSIS — R2689 Other abnormalities of gait and mobility: Secondary | ICD-10-CM | POA: Diagnosis not present

## 2022-06-28 DIAGNOSIS — M1990 Unspecified osteoarthritis, unspecified site: Secondary | ICD-10-CM | POA: Diagnosis not present

## 2022-06-28 DIAGNOSIS — R279 Unspecified lack of coordination: Secondary | ICD-10-CM | POA: Diagnosis not present

## 2022-06-28 DIAGNOSIS — M6259 Muscle wasting and atrophy, not elsewhere classified, multiple sites: Secondary | ICD-10-CM | POA: Diagnosis not present

## 2022-06-28 DIAGNOSIS — M6281 Muscle weakness (generalized): Secondary | ICD-10-CM | POA: Diagnosis not present

## 2022-06-28 DIAGNOSIS — I5032 Chronic diastolic (congestive) heart failure: Secondary | ICD-10-CM | POA: Diagnosis not present

## 2022-06-28 DIAGNOSIS — Z741 Need for assistance with personal care: Secondary | ICD-10-CM | POA: Diagnosis not present

## 2022-06-29 DIAGNOSIS — M6259 Muscle wasting and atrophy, not elsewhere classified, multiple sites: Secondary | ICD-10-CM | POA: Diagnosis not present

## 2022-06-29 DIAGNOSIS — I5032 Chronic diastolic (congestive) heart failure: Secondary | ICD-10-CM | POA: Diagnosis not present

## 2022-06-29 DIAGNOSIS — R279 Unspecified lack of coordination: Secondary | ICD-10-CM | POA: Diagnosis not present

## 2022-06-29 DIAGNOSIS — M1990 Unspecified osteoarthritis, unspecified site: Secondary | ICD-10-CM | POA: Diagnosis not present

## 2022-06-29 DIAGNOSIS — Z741 Need for assistance with personal care: Secondary | ICD-10-CM | POA: Diagnosis not present

## 2022-06-29 DIAGNOSIS — M6281 Muscle weakness (generalized): Secondary | ICD-10-CM | POA: Diagnosis not present

## 2022-06-29 DIAGNOSIS — R2689 Other abnormalities of gait and mobility: Secondary | ICD-10-CM | POA: Diagnosis not present

## 2022-07-02 DIAGNOSIS — M6281 Muscle weakness (generalized): Secondary | ICD-10-CM | POA: Diagnosis not present

## 2022-07-02 DIAGNOSIS — R279 Unspecified lack of coordination: Secondary | ICD-10-CM | POA: Diagnosis not present

## 2022-07-02 DIAGNOSIS — R2689 Other abnormalities of gait and mobility: Secondary | ICD-10-CM | POA: Diagnosis not present

## 2022-07-02 DIAGNOSIS — M1990 Unspecified osteoarthritis, unspecified site: Secondary | ICD-10-CM | POA: Diagnosis not present

## 2022-07-02 DIAGNOSIS — I5032 Chronic diastolic (congestive) heart failure: Secondary | ICD-10-CM | POA: Diagnosis not present

## 2022-07-02 DIAGNOSIS — Z741 Need for assistance with personal care: Secondary | ICD-10-CM | POA: Diagnosis not present

## 2022-07-02 DIAGNOSIS — M6259 Muscle wasting and atrophy, not elsewhere classified, multiple sites: Secondary | ICD-10-CM | POA: Diagnosis not present

## 2022-07-03 DIAGNOSIS — M6259 Muscle wasting and atrophy, not elsewhere classified, multiple sites: Secondary | ICD-10-CM | POA: Diagnosis not present

## 2022-07-03 DIAGNOSIS — Z741 Need for assistance with personal care: Secondary | ICD-10-CM | POA: Diagnosis not present

## 2022-07-03 DIAGNOSIS — M1990 Unspecified osteoarthritis, unspecified site: Secondary | ICD-10-CM | POA: Diagnosis not present

## 2022-07-03 DIAGNOSIS — M6281 Muscle weakness (generalized): Secondary | ICD-10-CM | POA: Diagnosis not present

## 2022-07-03 DIAGNOSIS — R2689 Other abnormalities of gait and mobility: Secondary | ICD-10-CM | POA: Diagnosis not present

## 2022-07-03 DIAGNOSIS — R279 Unspecified lack of coordination: Secondary | ICD-10-CM | POA: Diagnosis not present

## 2022-07-04 DIAGNOSIS — S82842D Displaced bimalleolar fracture of left lower leg, subsequent encounter for closed fracture with routine healing: Secondary | ICD-10-CM | POA: Diagnosis not present

## 2022-07-04 DIAGNOSIS — I2510 Atherosclerotic heart disease of native coronary artery without angina pectoris: Secondary | ICD-10-CM | POA: Diagnosis not present

## 2022-07-04 DIAGNOSIS — M1990 Unspecified osteoarthritis, unspecified site: Secondary | ICD-10-CM | POA: Diagnosis not present

## 2022-07-04 DIAGNOSIS — I739 Peripheral vascular disease, unspecified: Secondary | ICD-10-CM | POA: Diagnosis not present

## 2022-07-04 DIAGNOSIS — Z741 Need for assistance with personal care: Secondary | ICD-10-CM | POA: Diagnosis not present

## 2022-07-04 DIAGNOSIS — M6281 Muscle weakness (generalized): Secondary | ICD-10-CM | POA: Diagnosis not present

## 2022-07-04 DIAGNOSIS — R279 Unspecified lack of coordination: Secondary | ICD-10-CM | POA: Diagnosis not present

## 2022-07-04 DIAGNOSIS — I639 Cerebral infarction, unspecified: Secondary | ICD-10-CM | POA: Diagnosis not present

## 2022-07-04 DIAGNOSIS — R2689 Other abnormalities of gait and mobility: Secondary | ICD-10-CM | POA: Diagnosis not present

## 2022-07-04 DIAGNOSIS — M6259 Muscle wasting and atrophy, not elsewhere classified, multiple sites: Secondary | ICD-10-CM | POA: Diagnosis not present

## 2022-07-05 DIAGNOSIS — Z741 Need for assistance with personal care: Secondary | ICD-10-CM | POA: Diagnosis not present

## 2022-07-05 DIAGNOSIS — R279 Unspecified lack of coordination: Secondary | ICD-10-CM | POA: Diagnosis not present

## 2022-07-05 DIAGNOSIS — M6259 Muscle wasting and atrophy, not elsewhere classified, multiple sites: Secondary | ICD-10-CM | POA: Diagnosis not present

## 2022-07-05 DIAGNOSIS — M6281 Muscle weakness (generalized): Secondary | ICD-10-CM | POA: Diagnosis not present

## 2022-07-05 DIAGNOSIS — M1990 Unspecified osteoarthritis, unspecified site: Secondary | ICD-10-CM | POA: Diagnosis not present

## 2022-07-05 DIAGNOSIS — R2689 Other abnormalities of gait and mobility: Secondary | ICD-10-CM | POA: Diagnosis not present

## 2022-07-06 DIAGNOSIS — M6281 Muscle weakness (generalized): Secondary | ICD-10-CM | POA: Diagnosis not present

## 2022-07-06 DIAGNOSIS — M6259 Muscle wasting and atrophy, not elsewhere classified, multiple sites: Secondary | ICD-10-CM | POA: Diagnosis not present

## 2022-07-06 DIAGNOSIS — R279 Unspecified lack of coordination: Secondary | ICD-10-CM | POA: Diagnosis not present

## 2022-07-06 DIAGNOSIS — M1990 Unspecified osteoarthritis, unspecified site: Secondary | ICD-10-CM | POA: Diagnosis not present

## 2022-07-06 DIAGNOSIS — Z741 Need for assistance with personal care: Secondary | ICD-10-CM | POA: Diagnosis not present

## 2022-07-06 DIAGNOSIS — R2689 Other abnormalities of gait and mobility: Secondary | ICD-10-CM | POA: Diagnosis not present

## 2022-07-08 DIAGNOSIS — R0689 Other abnormalities of breathing: Secondary | ICD-10-CM | POA: Diagnosis not present

## 2022-07-09 DIAGNOSIS — M6259 Muscle wasting and atrophy, not elsewhere classified, multiple sites: Secondary | ICD-10-CM | POA: Diagnosis not present

## 2022-07-09 DIAGNOSIS — M1990 Unspecified osteoarthritis, unspecified site: Secondary | ICD-10-CM | POA: Diagnosis not present

## 2022-07-09 DIAGNOSIS — R279 Unspecified lack of coordination: Secondary | ICD-10-CM | POA: Diagnosis not present

## 2022-07-09 DIAGNOSIS — M6281 Muscle weakness (generalized): Secondary | ICD-10-CM | POA: Diagnosis not present

## 2022-07-09 DIAGNOSIS — Z741 Need for assistance with personal care: Secondary | ICD-10-CM | POA: Diagnosis not present

## 2022-07-09 DIAGNOSIS — R2689 Other abnormalities of gait and mobility: Secondary | ICD-10-CM | POA: Diagnosis not present

## 2022-07-10 ENCOUNTER — Encounter: Payer: Self-pay | Admitting: Cardiovascular Disease

## 2022-07-10 ENCOUNTER — Telehealth: Payer: Self-pay | Admitting: Cardiovascular Disease

## 2022-07-10 DIAGNOSIS — R279 Unspecified lack of coordination: Secondary | ICD-10-CM | POA: Diagnosis not present

## 2022-07-10 DIAGNOSIS — Z741 Need for assistance with personal care: Secondary | ICD-10-CM | POA: Diagnosis not present

## 2022-07-10 DIAGNOSIS — M6259 Muscle wasting and atrophy, not elsewhere classified, multiple sites: Secondary | ICD-10-CM | POA: Diagnosis not present

## 2022-07-10 DIAGNOSIS — E039 Hypothyroidism, unspecified: Secondary | ICD-10-CM | POA: Diagnosis not present

## 2022-07-10 DIAGNOSIS — M6281 Muscle weakness (generalized): Secondary | ICD-10-CM | POA: Diagnosis not present

## 2022-07-10 DIAGNOSIS — M1990 Unspecified osteoarthritis, unspecified site: Secondary | ICD-10-CM | POA: Diagnosis not present

## 2022-07-10 DIAGNOSIS — I10 Essential (primary) hypertension: Secondary | ICD-10-CM | POA: Diagnosis not present

## 2022-07-10 DIAGNOSIS — R2689 Other abnormalities of gait and mobility: Secondary | ICD-10-CM | POA: Diagnosis not present

## 2022-07-10 DIAGNOSIS — G5791 Unspecified mononeuropathy of right lower limb: Secondary | ICD-10-CM | POA: Diagnosis not present

## 2022-07-10 DIAGNOSIS — E11319 Type 2 diabetes mellitus with unspecified diabetic retinopathy without macular edema: Secondary | ICD-10-CM | POA: Diagnosis not present

## 2022-07-10 DIAGNOSIS — L989 Disorder of the skin and subcutaneous tissue, unspecified: Secondary | ICD-10-CM | POA: Diagnosis not present

## 2022-07-10 DIAGNOSIS — E1140 Type 2 diabetes mellitus with diabetic neuropathy, unspecified: Secondary | ICD-10-CM | POA: Diagnosis not present

## 2022-07-10 DIAGNOSIS — E1165 Type 2 diabetes mellitus with hyperglycemia: Secondary | ICD-10-CM | POA: Diagnosis not present

## 2022-07-10 NOTE — Telephone Encounter (Signed)
Pt c/o swelling: STAT is pt has developed SOB within 24 hours  If swelling, where is the swelling located? Feet, legs, and stomach   How much weight have you gained and in what time span?  22 lbs in 2 weeks   Have you gained 3 pounds in a day or 5 pounds in a week? Yes, both.   Do you have a log of your daily weights (if so, list)? Assisted living facility does, not daughter   Are you currently taking a fluid pill? Yes 40 mg in the morning   Are you currently SOB? No   Have you traveled recently? No     Daughter is requesting an appt regarding this.

## 2022-07-10 NOTE — Progress Notes (Unsigned)
Cardiology Office Note   Date:  07/11/2022   ID:  [name], DOB [birth year], MRN [id number]  PCP:  [name], MD  Cardiologist: Darlin Coco MD  --> Carmyn Hamm   Chief Complaint  Patient presents with   Coronary Artery Disease   Congestive Heart Failure          Problem list 1. Coronary artery disease-status post coronary artery bypass grafting 2. Essential hypertension 3.  Chronic diastolic congestive heart failure  Sierra Wright is a 88 y.o. female who presents for a six-month office visit This pleasant 88 year old woman is seen for a followup office visit.  Her PCP is Dr. [name]. The patient has a history of known ischemic heart disease. She had successful CABG 16 years ago by Dr. Servando Snare. She has not been experiencing any subsequent chest pain or angina. She notes occasional shortness of breath.  Her last echocardiogram was 03/25/14 and showed an ejection fraction of 65-70% with grade 1 diastolic dysfunction.  She has a history of hypertension and she has a past history of mild fluid retention responding to Lasix. She has intermittent ankle edema.  She has occasional mild dizzy spells but no syncope. She has not been experiencing any palpitations or tachycardia. She is relatively sedentary because of significant osteoarthritis of her knees. She arrived in our office today by wheelchair. She sleeps with one pillow under her head and also a pillow under her feet to help with her mild dependent edema. During the day she wears compression hose. She does not have any history of TIA or stroke. She does not have any history of peptic ulcer disease or GI bleeding. She has a history of intolerance to cholesterol lowering drugs including statins, ezetimibe, and Welchol. Family history is positive for ischemic heart disease. Her father died at 50 and a brother died at 34 of heart attacks.  At her last visit we decreased her beta blocker because of bradycardia and we  increased her hydralazine because of systolic hypertension.  Since then her blood pressure has been satisfactory. The patient has not been having any chest pain.  She has had no recurrence of the paroxysmal atrial flutter fibrillation which occurred in August 2016 immediately postoperative. she does get short of breath with minimal housework activities.  Her peripheral edema has resolved.  EKG today shows normal sinus rhythm.  Apr 16, 2016:  Pt is seen today .   Recent transfer from Dr. Mare Ferrari.  Has hx of CAD, chronic diastolic CHF,  Essential HTN. No cardiac issues. Is in a wheelchair ,   Uses a walker at home  Avoids salt .   10/04/2016:   Sierra Wright is seen today for follow-up visit. Is very short of breath today since her hospitalization one month ago. She has been in the hospital twice since I last saw her.   She was admitted in October and was seen by Dr. Percival Spanish  on October 1. She had presented with episodes of chest pain. Troponin levels were negative. He recommended an outpatient Harrold study.  Has not had the stress myoview yet   Aug. 14, 2018:  Sierra Wright is here to follow-up for her  CAD and shortness of breath.  She had a stress Myoview study in November, 2017 which was normal. She has no ischemia.  Left ventricular ejection fraction 60%.  August 11, 2018: [name] is seen as a work in visit .  She has been having worsening dyspnea.  More  shortness of breath recently  Has a dry cough  Has not seen her primary MD recently Has a headache. Is dizzy.    Has a choking sensation when she lies down.   Had CP on one occasion when she was choking SL NTG helped  This was 6 weeks ago  Has had a chronic headache for weeks .  These symptoms do not feel like her symptoms prior to her CABG .  Had a false positive myoview in 2004.   January 12, 2019:  Sierra Wright seen back today for follow-up visit.  When I saw her previously in September, 2019 she was having symptoms that  were concerning for unstable angina. She had a mildly elevated troponin level and we sent her over to the hospital to be admitted.  Heart catheterization revealed three-vessel coronary artery disease.  2 of the 3 of her bypass grafts were patent. No targets for PCI.  The patient was treated medically.  Had lots of CP,  Had severe CP   Took 2 SL NTG with eventual relief .    Sept. 3, 2020  Seen today for follow up .  Seen with daugter.   [name] No cp.   Has leg cramps .  Her foot doctor has asked for LE arterial duplex scan and ABIs.   February 03, 2020:  Sierra Wright seen today for follow-up visit.   She was seen with daughter, Clent Demark.  She has a history of coronary artery disease with coronary artery bypass grafting.  She has known severe native and graft disease.  She is not a candidate for any further interventions.  She was found to have elevated end-diastolic filling pressure at her last heart catheterization in 2019.  She was seen in the ER Feb. 17, 2021.    Her last echocardiogram performed in August, 2017 reveals normal left ventricular systolic function. Does not walk .  Only stands to transfer from one to chair.  Limited by severe DOE and weakness  Still eats some salty foods.  Frozen dinners.  Takes Lasix 40 BID .  Does not want to take more lasix  Has headaches.   March 15, 2021:   Has leg swelling  Has frequent headaches  No new complaints   Aug. 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 mild pulmonary hypertension, moderate concentric LV hypertrophy. Atrial septum is aneurysmal.  Aortic valve appears to be mildly sclerotic.EF 88%   Urinary incontinence    Weakness of both legs     Past Surgical History:  Procedure Laterality Date   ABDOMINAL HYSTERECTOMY     APPENDECTOMY     BACK SURGERY     CARDIAC CATHERIZATION  08/20/03   Widely patent bypass grafts. Normal left ventricular systolic function.   CARDIAC CATHETERIZATION     CHOLECYSTECTOMY     COLONOSCOPY     CORONARY ARTERY BYPASS GRAFT     EYE SURGERY     bilateral cataracts   LEFT HEART CATH AND CORONARY ANGIOGRAPHY N/A 08/13/2018   Procedure: LEFT HEART CATH AND CORONARY ANGIOGRAPHY;  Surgeon: Kathleene Hazel, MD;  Location: [facility]ASIVE [facility];  Service: Cardiovascular;  Laterality: N/A;   LUMBAR LAMINECTOMY/DECOMPRESSION MICRODISCECTOMY N/A 07/26/2015   Procedure: BILATERAL Lumbar 3-4 SUBTOTAL HEMILAMINECTOMY WITH LATERAL RECESS DECOMPRESSION;  Surgeon: Kerrin Champagne, MD;  Location: [facility] OR;  Service: Orthopedics;  Laterality: N/A;   SHOULDER SURGERY     TUBAL LIGATION       Current Outpatient Medications  Medication Sig Dispense Refill   albuterol (PROVENTIL) (2.5 MG/3ML) 0.083% nebulizer solution Take 2.5 mg by nebulization every 6 (six) hours as needed for wheezing or shortness of breath.     amLODipine (NORVASC) 10 MG tablet Take 10 mg by mouth daily.     apixaban (ELIQUIS) 5 MG TABS tablet Take 1 tablet (5 mg total) by mouth 2 (two) times daily.     Ascorbic Acid  (VITAMIN C) 1000 MG tablet Take 1,000 mg by mouth daily.     bisacodyl (DULCOLAX) 10 MG suppository Place 10 mg rectally daily as needed for moderate constipation (if not relieved by MOM).     carbidopa-levodopa (SINEMET IR) 25-100 MG tablet Take 1.5 tablets by mouth at bedtime. 135 tablet 3   cholecalciferol (VITAMIN D3) 25 MCG (1000 UNIT) tablet Take 1,000 Units by mouth daily.     Continuous Blood Gluc Receiver (FREESTYLE LIBRE 2 READER) DEVI      Continuous Blood Gluc Sensor (FREESTYLE LIBRE 2 SENSOR) MISC      furosemide (LASIX) 40 MG tablet Take 1 tablet (40 mg total) by mouth daily.     hydrALAZINE (APRESOLINE) 25 MG tablet TAKE 1 TABLET BY MOUTH THREE TIMES DAILY (Patient taking differently: Take 25 mg by mouth 3 (three) times daily.) 270 tablet 2   insulin glargine (LANTUS SOLOSTAR) 100 UNIT/ML Solostar Pen Inject 28 Units into the skin daily.     insulin lispro (HUMALOG) 100 UNIT/ML KwikPen Inject 5 Units into the skin 2 (two) times daily. Before lunch and dinner     isosorbide mononitrate (IMDUR) 30 MG 24 hr tablet Take 1 tablet by mouth once daily (Patient taking differently: Take 30 mg by mouth daily.) 90 tablet 2   levothyroxine (SYNTHROID) 112 MCG tablet Take 112 mcg by mouth daily before breakfast.     Magnesium Hydroxide (MILK OF MAGNESIA PO) Take 30 mLs by mouth daily as needed (constipation).     meclizine (ANTIVERT) 25 MG tablet Take 25 mg by mouth daily.     mupirocin ointment (BACTROBAN) 2 % Apply 1 Application topically 2 (two) times daily. TO LESION ON DORSAL RIGHT HAND     nitroGLYCERIN (NITROSTAT) 0.4 MG SL tablet DISSOLVE ONE TABLET UNDER THE TONGUE EVERY 5 MINUTES AS NEEDED FOR CHEST PAIN. (Patient taking differently: Place 0.4 mg under the tongue every 5 (five) minutes as needed for chest pain.) 25  tablet 5   NON FORMULARY Apply 1 Application topically 2 (two) times daily. To back and other affected area - Anti-Itch Lotion 0.5%-0.5%     nystatin [facility]) powder Apply 1  Application topically 2 (two) times daily. Apply to buttocks and groin followed by a thin layer of barrier cream.     ondansetron (ZOFRAN) 4 MG tablet Take 4 mg by mouth every 6 (six) hours as needed for nausea.     oxycodone (OXY-IR) 5 MG capsule Take 5 mg by mouth daily.     Oyster Shell Calcium 500 MG TABS Take 500 mg by mouth daily. TAKE 1 TABLET BY MOUTH ONCE DAILY FOR SUPPLEMENT     pregabalin (LYRICA) 50 MG capsule Take 1 capsule (50 mg total) by mouth 2 (two) times daily. 60 capsule 0   saccharomyces boulardii (FLORASTOR) 250 MG capsule Take 250 mg by mouth daily.     Sodium Phosphates (RA SALINE ENEMA RE) Place 1 Dose rectally daily as needed (constipation not relieved by bisacodyl).     vitamin B-12 (CYANOCOBALAMIN) 1000 MCG tablet Take 1,000 mcg by mouth daily.     No current facility-administered medications for this visit.    Allergies:   Duloxetine hcl, Statins, Amitriptyline, Benadryl [diphenhydramine hcl (sleep)], Ezetimibe, Gabapentin, Metformin hcl, Other, Sitagliptin, Tylenol [acetaminophen], and Colesevelam hcl    Social History:  The patient  reports that she has never smoked. She has never been exposed to tobacco smoke. She has never used smokeless tobacco. She reports that she does not drink alcohol and does not use drugs.   Family History:  The patient's family history includes Healthy in her daughter; Heart attack in her father; Heart attack (age of onset: 27) in her brother; Liver cancer in her sister; Other in her mother; Pneumonia in her mother; Stroke in her sister; Stroke (age of onset: 70) in her son.    ROS:  Please see the history of present illness.   Otherwise, review of systems are positive for none.   All other systems are reviewed and negative.    Physical Exam: Blood pressure 136/62, pulse 60, height 5\' 5"  (1.651 m), weight 243 lb (110.2 kg), SpO2 95 %.  GEN:  Well nourished, well developed in no acute distress HEENT: Normal NECK: No JVD; No carotid  bruits LYMPHATICS: No lymphadenopathy CARDIAC: RRR ***, no murmurs, rubs, gallops RESPIRATORY:  Clear to auscultation without rales, wheezing or rhonchi  ABDOMEN: Soft, non-tender, non-distended MUSCULOSKELETAL:  No edema; No deformity  SKIN: Warm and dry NEUROLOGIC:  Alert and oriented x 3   EKG:    Recent Labs: 02/13/2022: B Natriuretic Peptide 150.2 06/09/2022: ALT 7; TSH 4.104 06/10/2022: Hemoglobin 12.6; Magnesium 1.9; Platelets 189 06/12/2022: BUN 20; Creatinine, Ser 0.94; Potassium 3.7; Sodium 138    Lipid Panel    Component Value Date/Time   CHOL 149 08/12/2018 0236   CHOL 183 08/11/2018 1254   TRIG 171 (H) 08/12/2018 0236   HDL 34 (L) 08/12/2018 0236   HDL 45 08/11/2018 1254   CHOLHDL 4.4 08/12/2018 0236   VLDL 34 08/12/2018 0236   LDLCALC 81 08/12/2018 0236   LDLCALC 93 08/11/2018 1254      Wt Readings from Last 3 Encounters:  07/11/22 243 lb (110.2 kg)  06/12/22 226 lb 10.1 oz (102.8 kg)  04/24/22 222 lb (100.7 kg)     ASSESSMENT AND PLAN:  1.  CAD:    Has CAD .   Cath in 2019.   Showed severe native coronary  artery disease.  She has 2 of 3 patent grafts.  There is severe stenosis in the proximal LAD followed by occlusion of the LAD the in the midsegment.  The LIMA is patent to the distal LAD.    2. ischemic heart disease status post CABG:.    3. chronic diastolic CHF :    Will increase her lasix to 40 BID Add kdur 10 BID BMP in 2-3 weeks   Follow up in 6 months    4.  Generalized failure to thrive:      Kristeen Miss, MD  07/11/2022 2:58 PM    [facility] [address] [place],  [address] [place], [place]  [address] Pager [phone] Phone: [phone]; Fax: [phone]

## 2022-07-10 NOTE — Telephone Encounter (Signed)
Patient's daughter Charlton Amor called stating patient has gained 22lbs in 2 weeks. She states she can tell she is swollen in feet, legs, and stomach. Daughter states patient denies SOB.  She is currently taking Lasix 40mg  QD, no missed doses. Daughter requesting appt for evaluation.  Scheduled appt with primary cardiologist Dr. for 07/11/22.

## 2022-07-11 ENCOUNTER — Encounter: Payer: Self-pay | Admitting: Cardiovascular Disease

## 2022-07-11 ENCOUNTER — Other Ambulatory Visit: Payer: Self-pay

## 2022-07-11 ENCOUNTER — Ambulatory Visit (INDEPENDENT_AMBULATORY_CARE_PROVIDER_SITE_OTHER): Payer: Medicare Other | Admitting: Cardiovascular Disease

## 2022-07-11 VITALS — BP 136/62 | HR 60 | Ht 65.0 in | Wt 243.0 lb

## 2022-07-11 DIAGNOSIS — M6281 Muscle weakness (generalized): Secondary | ICD-10-CM | POA: Diagnosis not present

## 2022-07-11 DIAGNOSIS — I5032 Chronic diastolic (congestive) heart failure: Secondary | ICD-10-CM

## 2022-07-11 DIAGNOSIS — R279 Unspecified lack of coordination: Secondary | ICD-10-CM | POA: Diagnosis not present

## 2022-07-11 DIAGNOSIS — R2689 Other abnormalities of gait and mobility: Secondary | ICD-10-CM | POA: Diagnosis not present

## 2022-07-11 DIAGNOSIS — I25709 Atherosclerosis of coronary artery bypass graft(s), unspecified, with unspecified angina pectoris: Secondary | ICD-10-CM | POA: Diagnosis not present

## 2022-07-11 DIAGNOSIS — Z741 Need for assistance with personal care: Secondary | ICD-10-CM | POA: Diagnosis not present

## 2022-07-11 DIAGNOSIS — E039 Hypothyroidism, unspecified: Secondary | ICD-10-CM | POA: Diagnosis not present

## 2022-07-11 DIAGNOSIS — M1990 Unspecified osteoarthritis, unspecified site: Secondary | ICD-10-CM | POA: Diagnosis not present

## 2022-07-11 DIAGNOSIS — M6259 Muscle wasting and atrophy, not elsewhere classified, multiple sites: Secondary | ICD-10-CM | POA: Diagnosis not present

## 2022-07-11 MED ORDER — FUROSEMIDE 40 MG PO TABS: 40.0000 mg | ORAL_TABLET | Freq: Every day | ORAL | 3 refills | Status: DC

## 2022-07-11 MED ORDER — POTASSIUM CHLORIDE ER 10 MEQ PO TBCR: 10.0000 meq | EXTENDED_RELEASE_TABLET | Freq: Every day | ORAL | 3 refills | Status: DC

## 2022-07-11 NOTE — Patient Instructions (Addendum)
Medication Instructions:  Your physician has recommended you make the following change in your medication:  Lasix 40 mg 2 x day Kdur (potassium) 10 meq 2x day  *If you need a refill on your cardiac medications before your next appointment, please call your pharmacy*  Testing/Procedures: BMP to be drawn at facility 2- 3 weeks   Follow-Up: At [facility], you and your health needs are our priority.  As part of our continuing mission to provide you with exceptional heart care, we have created designated Provider Care Teams.  These Care Teams include your primary Cardiologist (physician) and Advanced Practice Providers (APPs -  Physician Assistants and Nurse Practitioners) who all work together to provide you with the care you need, when you need it.  We recommend signing up for the patient portal called "MyChart".  Sign up information is provided on this After Visit Summary.  MyChart is used to connect with patients for Virtual Visits (Telemedicine).  Patients are able to view lab/test results, encounter notes, upcoming appointments, etc.  Non-urgent messages can be sent to your provider as well.   To learn more about what you can do with MyChart, go to [email or web address].    Your next appointment:   6 month(s)  The format for your next appointment:   In Person  Provider:   Kristeen Miss, MD {   Other: For your  leg edema you  should do  the following 1. Leg elevation - I recommend the Lounge Dr. Leg rest.  See below for details  2. Salt restriction  -  Use potassium chloride instead of regular salt as a salt substitute. 3. Walk regularly 4. Compression hose - Medical Supply store  5. Weight loss    Available on [email or web address] Or  Go to [email or web address]

## 2022-07-12 DIAGNOSIS — M6281 Muscle weakness (generalized): Secondary | ICD-10-CM | POA: Diagnosis not present

## 2022-07-12 DIAGNOSIS — M1990 Unspecified osteoarthritis, unspecified site: Secondary | ICD-10-CM | POA: Diagnosis not present

## 2022-07-12 DIAGNOSIS — R2689 Other abnormalities of gait and mobility: Secondary | ICD-10-CM | POA: Diagnosis not present

## 2022-07-12 DIAGNOSIS — M6259 Muscle wasting and atrophy, not elsewhere classified, multiple sites: Secondary | ICD-10-CM | POA: Diagnosis not present

## 2022-07-12 DIAGNOSIS — Z741 Need for assistance with personal care: Secondary | ICD-10-CM | POA: Diagnosis not present

## 2022-07-12 DIAGNOSIS — R279 Unspecified lack of coordination: Secondary | ICD-10-CM | POA: Diagnosis not present

## 2022-07-13 DIAGNOSIS — M6281 Muscle weakness (generalized): Secondary | ICD-10-CM | POA: Diagnosis not present

## 2022-07-13 DIAGNOSIS — Z741 Need for assistance with personal care: Secondary | ICD-10-CM | POA: Diagnosis not present

## 2022-07-13 DIAGNOSIS — M6259 Muscle wasting and atrophy, not elsewhere classified, multiple sites: Secondary | ICD-10-CM | POA: Diagnosis not present

## 2022-07-13 DIAGNOSIS — M1990 Unspecified osteoarthritis, unspecified site: Secondary | ICD-10-CM | POA: Diagnosis not present

## 2022-07-13 DIAGNOSIS — R279 Unspecified lack of coordination: Secondary | ICD-10-CM | POA: Diagnosis not present

## 2022-07-13 DIAGNOSIS — R2689 Other abnormalities of gait and mobility: Secondary | ICD-10-CM | POA: Diagnosis not present

## 2022-07-15 DIAGNOSIS — Z741 Need for assistance with personal care: Secondary | ICD-10-CM | POA: Diagnosis not present

## 2022-07-15 DIAGNOSIS — M6281 Muscle weakness (generalized): Secondary | ICD-10-CM | POA: Diagnosis not present

## 2022-07-15 DIAGNOSIS — M1990 Unspecified osteoarthritis, unspecified site: Secondary | ICD-10-CM | POA: Diagnosis not present

## 2022-07-15 DIAGNOSIS — R2689 Other abnormalities of gait and mobility: Secondary | ICD-10-CM | POA: Diagnosis not present

## 2022-07-15 DIAGNOSIS — M6259 Muscle wasting and atrophy, not elsewhere classified, multiple sites: Secondary | ICD-10-CM | POA: Diagnosis not present

## 2022-07-15 DIAGNOSIS — R279 Unspecified lack of coordination: Secondary | ICD-10-CM | POA: Diagnosis not present

## 2022-07-16 DIAGNOSIS — M1990 Unspecified osteoarthritis, unspecified site: Secondary | ICD-10-CM | POA: Diagnosis not present

## 2022-07-16 DIAGNOSIS — Z741 Need for assistance with personal care: Secondary | ICD-10-CM | POA: Diagnosis not present

## 2022-07-16 DIAGNOSIS — M6281 Muscle weakness (generalized): Secondary | ICD-10-CM | POA: Diagnosis not present

## 2022-07-16 DIAGNOSIS — R279 Unspecified lack of coordination: Secondary | ICD-10-CM | POA: Diagnosis not present

## 2022-07-16 DIAGNOSIS — R2689 Other abnormalities of gait and mobility: Secondary | ICD-10-CM | POA: Diagnosis not present

## 2022-07-16 DIAGNOSIS — M6259 Muscle wasting and atrophy, not elsewhere classified, multiple sites: Secondary | ICD-10-CM | POA: Diagnosis not present

## 2022-07-17 DIAGNOSIS — M6281 Muscle weakness (generalized): Secondary | ICD-10-CM | POA: Diagnosis not present

## 2022-07-17 DIAGNOSIS — R2689 Other abnormalities of gait and mobility: Secondary | ICD-10-CM | POA: Diagnosis not present

## 2022-07-17 DIAGNOSIS — M6259 Muscle wasting and atrophy, not elsewhere classified, multiple sites: Secondary | ICD-10-CM | POA: Diagnosis not present

## 2022-07-17 DIAGNOSIS — M1990 Unspecified osteoarthritis, unspecified site: Secondary | ICD-10-CM | POA: Diagnosis not present

## 2022-07-17 DIAGNOSIS — R279 Unspecified lack of coordination: Secondary | ICD-10-CM | POA: Diagnosis not present

## 2022-07-17 DIAGNOSIS — Z741 Need for assistance with personal care: Secondary | ICD-10-CM | POA: Diagnosis not present

## 2022-07-18 DIAGNOSIS — R279 Unspecified lack of coordination: Secondary | ICD-10-CM | POA: Diagnosis not present

## 2022-07-18 DIAGNOSIS — Z741 Need for assistance with personal care: Secondary | ICD-10-CM | POA: Diagnosis not present

## 2022-07-18 DIAGNOSIS — M1990 Unspecified osteoarthritis, unspecified site: Secondary | ICD-10-CM | POA: Diagnosis not present

## 2022-07-18 DIAGNOSIS — M6259 Muscle wasting and atrophy, not elsewhere classified, multiple sites: Secondary | ICD-10-CM | POA: Diagnosis not present

## 2022-07-18 DIAGNOSIS — M6281 Muscle weakness (generalized): Secondary | ICD-10-CM | POA: Diagnosis not present

## 2022-07-18 DIAGNOSIS — R2689 Other abnormalities of gait and mobility: Secondary | ICD-10-CM | POA: Diagnosis not present

## 2022-07-19 DIAGNOSIS — M1990 Unspecified osteoarthritis, unspecified site: Secondary | ICD-10-CM | POA: Diagnosis not present

## 2022-07-19 DIAGNOSIS — L989 Disorder of the skin and subcutaneous tissue, unspecified: Secondary | ICD-10-CM | POA: Diagnosis not present

## 2022-07-19 DIAGNOSIS — M6259 Muscle wasting and atrophy, not elsewhere classified, multiple sites: Secondary | ICD-10-CM | POA: Diagnosis not present

## 2022-07-19 DIAGNOSIS — L299 Pruritus, unspecified: Secondary | ICD-10-CM | POA: Diagnosis not present

## 2022-07-19 DIAGNOSIS — R2689 Other abnormalities of gait and mobility: Secondary | ICD-10-CM | POA: Diagnosis not present

## 2022-07-19 DIAGNOSIS — R279 Unspecified lack of coordination: Secondary | ICD-10-CM | POA: Diagnosis not present

## 2022-07-19 DIAGNOSIS — Z741 Need for assistance with personal care: Secondary | ICD-10-CM | POA: Diagnosis not present

## 2022-07-19 DIAGNOSIS — M6281 Muscle weakness (generalized): Secondary | ICD-10-CM | POA: Diagnosis not present

## 2022-07-19 DIAGNOSIS — I5032 Chronic diastolic (congestive) heart failure: Secondary | ICD-10-CM | POA: Diagnosis not present

## 2022-07-20 DIAGNOSIS — R279 Unspecified lack of coordination: Secondary | ICD-10-CM | POA: Diagnosis not present

## 2022-07-20 DIAGNOSIS — R2689 Other abnormalities of gait and mobility: Secondary | ICD-10-CM | POA: Diagnosis not present

## 2022-07-20 DIAGNOSIS — M6259 Muscle wasting and atrophy, not elsewhere classified, multiple sites: Secondary | ICD-10-CM | POA: Diagnosis not present

## 2022-07-20 DIAGNOSIS — Z741 Need for assistance with personal care: Secondary | ICD-10-CM | POA: Diagnosis not present

## 2022-07-20 DIAGNOSIS — M6281 Muscle weakness (generalized): Secondary | ICD-10-CM | POA: Diagnosis not present

## 2022-07-20 DIAGNOSIS — M1990 Unspecified osteoarthritis, unspecified site: Secondary | ICD-10-CM | POA: Diagnosis not present

## 2022-07-23 DIAGNOSIS — M6259 Muscle wasting and atrophy, not elsewhere classified, multiple sites: Secondary | ICD-10-CM | POA: Diagnosis not present

## 2022-07-23 DIAGNOSIS — Z741 Need for assistance with personal care: Secondary | ICD-10-CM | POA: Diagnosis not present

## 2022-07-23 DIAGNOSIS — R2689 Other abnormalities of gait and mobility: Secondary | ICD-10-CM | POA: Diagnosis not present

## 2022-07-23 DIAGNOSIS — R279 Unspecified lack of coordination: Secondary | ICD-10-CM | POA: Diagnosis not present

## 2022-07-23 DIAGNOSIS — M6281 Muscle weakness (generalized): Secondary | ICD-10-CM | POA: Diagnosis not present

## 2022-07-23 DIAGNOSIS — M1990 Unspecified osteoarthritis, unspecified site: Secondary | ICD-10-CM | POA: Diagnosis not present

## 2022-07-24 DIAGNOSIS — M6259 Muscle wasting and atrophy, not elsewhere classified, multiple sites: Secondary | ICD-10-CM | POA: Diagnosis not present

## 2022-07-24 DIAGNOSIS — Z741 Need for assistance with personal care: Secondary | ICD-10-CM | POA: Diagnosis not present

## 2022-07-24 DIAGNOSIS — M1990 Unspecified osteoarthritis, unspecified site: Secondary | ICD-10-CM | POA: Diagnosis not present

## 2022-07-24 DIAGNOSIS — R2689 Other abnormalities of gait and mobility: Secondary | ICD-10-CM | POA: Diagnosis not present

## 2022-07-24 DIAGNOSIS — M6281 Muscle weakness (generalized): Secondary | ICD-10-CM | POA: Diagnosis not present

## 2022-07-24 DIAGNOSIS — R279 Unspecified lack of coordination: Secondary | ICD-10-CM | POA: Diagnosis not present

## 2022-07-25 DIAGNOSIS — M6281 Muscle weakness (generalized): Secondary | ICD-10-CM | POA: Diagnosis not present

## 2022-07-25 DIAGNOSIS — Z741 Need for assistance with personal care: Secondary | ICD-10-CM | POA: Diagnosis not present

## 2022-07-25 DIAGNOSIS — R2689 Other abnormalities of gait and mobility: Secondary | ICD-10-CM | POA: Diagnosis not present

## 2022-07-25 DIAGNOSIS — R279 Unspecified lack of coordination: Secondary | ICD-10-CM | POA: Diagnosis not present

## 2022-07-25 DIAGNOSIS — M6259 Muscle wasting and atrophy, not elsewhere classified, multiple sites: Secondary | ICD-10-CM | POA: Diagnosis not present

## 2022-07-25 DIAGNOSIS — M1990 Unspecified osteoarthritis, unspecified site: Secondary | ICD-10-CM | POA: Diagnosis not present

## 2022-07-25 DIAGNOSIS — I10 Essential (primary) hypertension: Secondary | ICD-10-CM | POA: Diagnosis not present

## 2022-07-26 DIAGNOSIS — Z741 Need for assistance with personal care: Secondary | ICD-10-CM | POA: Diagnosis not present

## 2022-07-26 DIAGNOSIS — M6281 Muscle weakness (generalized): Secondary | ICD-10-CM | POA: Diagnosis not present

## 2022-07-26 DIAGNOSIS — M1990 Unspecified osteoarthritis, unspecified site: Secondary | ICD-10-CM | POA: Diagnosis not present

## 2022-07-26 DIAGNOSIS — R279 Unspecified lack of coordination: Secondary | ICD-10-CM | POA: Diagnosis not present

## 2022-07-26 DIAGNOSIS — R2689 Other abnormalities of gait and mobility: Secondary | ICD-10-CM | POA: Diagnosis not present

## 2022-07-26 DIAGNOSIS — M6259 Muscle wasting and atrophy, not elsewhere classified, multiple sites: Secondary | ICD-10-CM | POA: Diagnosis not present

## 2022-07-27 DIAGNOSIS — Z741 Need for assistance with personal care: Secondary | ICD-10-CM | POA: Diagnosis not present

## 2022-07-27 DIAGNOSIS — M6259 Muscle wasting and atrophy, not elsewhere classified, multiple sites: Secondary | ICD-10-CM | POA: Diagnosis not present

## 2022-07-27 DIAGNOSIS — R2689 Other abnormalities of gait and mobility: Secondary | ICD-10-CM | POA: Diagnosis not present

## 2022-07-27 DIAGNOSIS — M1990 Unspecified osteoarthritis, unspecified site: Secondary | ICD-10-CM | POA: Diagnosis not present

## 2022-07-27 DIAGNOSIS — R279 Unspecified lack of coordination: Secondary | ICD-10-CM | POA: Diagnosis not present

## 2022-07-27 DIAGNOSIS — M6281 Muscle weakness (generalized): Secondary | ICD-10-CM | POA: Diagnosis not present

## 2022-07-30 DIAGNOSIS — M1990 Unspecified osteoarthritis, unspecified site: Secondary | ICD-10-CM | POA: Diagnosis not present

## 2022-07-30 DIAGNOSIS — R2689 Other abnormalities of gait and mobility: Secondary | ICD-10-CM | POA: Diagnosis not present

## 2022-07-30 DIAGNOSIS — R279 Unspecified lack of coordination: Secondary | ICD-10-CM | POA: Diagnosis not present

## 2022-07-30 DIAGNOSIS — M6259 Muscle wasting and atrophy, not elsewhere classified, multiple sites: Secondary | ICD-10-CM | POA: Diagnosis not present

## 2022-07-30 DIAGNOSIS — Z741 Need for assistance with personal care: Secondary | ICD-10-CM | POA: Diagnosis not present

## 2022-07-30 DIAGNOSIS — M6281 Muscle weakness (generalized): Secondary | ICD-10-CM | POA: Diagnosis not present

## 2022-07-31 DIAGNOSIS — M6259 Muscle wasting and atrophy, not elsewhere classified, multiple sites: Secondary | ICD-10-CM | POA: Diagnosis not present

## 2022-07-31 DIAGNOSIS — R279 Unspecified lack of coordination: Secondary | ICD-10-CM | POA: Diagnosis not present

## 2022-07-31 DIAGNOSIS — M1990 Unspecified osteoarthritis, unspecified site: Secondary | ICD-10-CM | POA: Diagnosis not present

## 2022-07-31 DIAGNOSIS — Z741 Need for assistance with personal care: Secondary | ICD-10-CM | POA: Diagnosis not present

## 2022-07-31 DIAGNOSIS — M6281 Muscle weakness (generalized): Secondary | ICD-10-CM | POA: Diagnosis not present

## 2022-07-31 DIAGNOSIS — R2689 Other abnormalities of gait and mobility: Secondary | ICD-10-CM | POA: Diagnosis not present

## 2022-08-01 DIAGNOSIS — M1990 Unspecified osteoarthritis, unspecified site: Secondary | ICD-10-CM | POA: Diagnosis not present

## 2022-08-01 DIAGNOSIS — Z741 Need for assistance with personal care: Secondary | ICD-10-CM | POA: Diagnosis not present

## 2022-08-01 DIAGNOSIS — M6259 Muscle wasting and atrophy, not elsewhere classified, multiple sites: Secondary | ICD-10-CM | POA: Diagnosis not present

## 2022-08-01 DIAGNOSIS — M6281 Muscle weakness (generalized): Secondary | ICD-10-CM | POA: Diagnosis not present

## 2022-08-01 DIAGNOSIS — R2689 Other abnormalities of gait and mobility: Secondary | ICD-10-CM | POA: Diagnosis not present

## 2022-08-01 DIAGNOSIS — R279 Unspecified lack of coordination: Secondary | ICD-10-CM | POA: Diagnosis not present

## 2022-08-02 DIAGNOSIS — R2689 Other abnormalities of gait and mobility: Secondary | ICD-10-CM | POA: Diagnosis not present

## 2022-08-02 DIAGNOSIS — M6259 Muscle wasting and atrophy, not elsewhere classified, multiple sites: Secondary | ICD-10-CM | POA: Diagnosis not present

## 2022-08-02 DIAGNOSIS — M1990 Unspecified osteoarthritis, unspecified site: Secondary | ICD-10-CM | POA: Diagnosis not present

## 2022-08-02 DIAGNOSIS — M6281 Muscle weakness (generalized): Secondary | ICD-10-CM | POA: Diagnosis not present

## 2022-08-02 DIAGNOSIS — R279 Unspecified lack of coordination: Secondary | ICD-10-CM | POA: Diagnosis not present

## 2022-08-02 DIAGNOSIS — Z741 Need for assistance with personal care: Secondary | ICD-10-CM | POA: Diagnosis not present

## 2022-08-03 DIAGNOSIS — M6259 Muscle wasting and atrophy, not elsewhere classified, multiple sites: Secondary | ICD-10-CM | POA: Diagnosis not present

## 2022-08-03 DIAGNOSIS — Z741 Need for assistance with personal care: Secondary | ICD-10-CM | POA: Diagnosis not present

## 2022-08-03 DIAGNOSIS — R279 Unspecified lack of coordination: Secondary | ICD-10-CM | POA: Diagnosis not present

## 2022-08-03 DIAGNOSIS — M1990 Unspecified osteoarthritis, unspecified site: Secondary | ICD-10-CM | POA: Diagnosis not present

## 2022-08-06 DIAGNOSIS — M6259 Muscle wasting and atrophy, not elsewhere classified, multiple sites: Secondary | ICD-10-CM | POA: Diagnosis not present

## 2022-08-06 DIAGNOSIS — Z741 Need for assistance with personal care: Secondary | ICD-10-CM | POA: Diagnosis not present

## 2022-08-06 DIAGNOSIS — R279 Unspecified lack of coordination: Secondary | ICD-10-CM | POA: Diagnosis not present

## 2022-08-06 DIAGNOSIS — M1990 Unspecified osteoarthritis, unspecified site: Secondary | ICD-10-CM | POA: Diagnosis not present

## 2022-08-08 DIAGNOSIS — Z741 Need for assistance with personal care: Secondary | ICD-10-CM | POA: Diagnosis not present

## 2022-08-08 DIAGNOSIS — M6259 Muscle wasting and atrophy, not elsewhere classified, multiple sites: Secondary | ICD-10-CM | POA: Diagnosis not present

## 2022-08-08 DIAGNOSIS — R279 Unspecified lack of coordination: Secondary | ICD-10-CM | POA: Diagnosis not present

## 2022-08-08 DIAGNOSIS — M1990 Unspecified osteoarthritis, unspecified site: Secondary | ICD-10-CM | POA: Diagnosis not present

## 2022-08-14 DIAGNOSIS — I739 Peripheral vascular disease, unspecified: Secondary | ICD-10-CM | POA: Diagnosis not present

## 2022-08-14 DIAGNOSIS — S82842D Displaced bimalleolar fracture of left lower leg, subsequent encounter for closed fracture with routine healing: Secondary | ICD-10-CM | POA: Diagnosis not present

## 2022-08-14 DIAGNOSIS — L299 Pruritus, unspecified: Secondary | ICD-10-CM | POA: Diagnosis not present

## 2022-08-14 DIAGNOSIS — I639 Cerebral infarction, unspecified: Secondary | ICD-10-CM | POA: Diagnosis not present

## 2022-08-21 DIAGNOSIS — L299 Pruritus, unspecified: Secondary | ICD-10-CM | POA: Diagnosis not present

## 2022-08-21 DIAGNOSIS — I639 Cerebral infarction, unspecified: Secondary | ICD-10-CM | POA: Diagnosis not present

## 2022-08-21 DIAGNOSIS — I739 Peripheral vascular disease, unspecified: Secondary | ICD-10-CM | POA: Diagnosis not present

## 2022-08-21 DIAGNOSIS — S82842D Displaced bimalleolar fracture of left lower leg, subsequent encounter for closed fracture with routine healing: Secondary | ICD-10-CM | POA: Diagnosis not present

## 2022-09-05 ENCOUNTER — Ambulatory Visit (INDEPENDENT_AMBULATORY_CARE_PROVIDER_SITE_OTHER): Payer: Medicare Other | Admitting: Podiatry

## 2022-09-05 ENCOUNTER — Encounter: Payer: Self-pay | Admitting: Podiatry

## 2022-09-05 DIAGNOSIS — B351 Tinea unguium: Secondary | ICD-10-CM

## 2022-09-05 DIAGNOSIS — I739 Peripheral vascular disease, unspecified: Secondary | ICD-10-CM | POA: Diagnosis not present

## 2022-09-05 DIAGNOSIS — E0842 Diabetes mellitus due to underlying condition with diabetic polyneuropathy: Secondary | ICD-10-CM | POA: Diagnosis not present

## 2022-09-05 DIAGNOSIS — S82842D Displaced bimalleolar fracture of left lower leg, subsequent encounter for closed fracture with routine healing: Secondary | ICD-10-CM | POA: Diagnosis not present

## 2022-09-05 DIAGNOSIS — M79674 Pain in right toe(s): Secondary | ICD-10-CM | POA: Diagnosis not present

## 2022-09-05 DIAGNOSIS — I639 Cerebral infarction, unspecified: Secondary | ICD-10-CM | POA: Diagnosis not present

## 2022-09-05 DIAGNOSIS — I2510 Atherosclerotic heart disease of native coronary artery without angina pectoris: Secondary | ICD-10-CM | POA: Diagnosis not present

## 2022-09-05 DIAGNOSIS — M79675 Pain in left toe(s): Secondary | ICD-10-CM | POA: Diagnosis not present

## 2022-09-09 NOTE — Progress Notes (Signed)
  Subjective:  Patient ID: Sierra Wright, female    DOB: 11-26-1934,  MRN: [id number]  Sierra Wright presents to clinic today for:  Chief Complaint  Patient presents with   Diabetes    Diabetic foot care, A1c- 8.0, Nail trim    Patient is a resident of [facility]. Patient states she was in physical therapy to strengthen her legs, but it was discontinued due to her not progressing with the treatment plan.  New problem(s): None.   PCP is [name], MD.  Review of Systems: Negative except as noted in the HPI.  Objective: No changes noted in today's physical examination.  Sierra Wright is a pleasant 88 y.o. female obese in NAD. AAO x 3.  Vascular Capillary fill time to digits <3 seconds b/l lower extremities. Faintly palpable DP pulse(s) b/l lower extremities. Faintly palpable PT pulse(s) b/l lower extremities. Pedal hair absent. Lower extremity skin temperature gradient within normal limits. No pain with calf compression b/l. No edema noted b/l lower extremities. No cyanosis or clubbing noted.  Neurologic Normal speech. Pt has subjective symptoms of neuropathy. Protective sensation decreased with 10 gram monofilament b/l.  Dermatologic Pedal skin with normal turgor, texture and tone bilaterally. No open wounds bilaterally. No interdigital macerations bilaterally. Toenails 1-5 b/l elongated, discolored, dystrophic, thickened, crumbly with subungual debris and tenderness to dorsal palpation. No pressure injuries b/l.  Orthopedic: Normal muscle strength 5/5 to all lower extremity muscle groups bilaterally. No pain crepitus or joint limitation noted with ROM b/l. Hallux valgus with bunion deformity noted b/l lower extremities. Patient presents in wheelchair.   Assessment/Plan: 1. Pain due to onychomycosis of toenails of both feet   2. Diabetic polyneuropathy associated with diabetes mellitus due to underlying condition ([place])     No orders of the defined  types were placed in this encounter.   -Consent given for treatment as described below: -Examined patient. -Facility to continue fall precautions and pressure precautions. -Toenails 1-5 b/l were debrided in length and girth with sterile nail nippers and dremel without iatrogenic bleeding.  -Patient/POA to call should there be question/concern in the interim.   Return in about 3 months (around 12/06/2022).  Marzetta Board, DPM

## 2022-09-10 DIAGNOSIS — S82842D Displaced bimalleolar fracture of left lower leg, subsequent encounter for closed fracture with routine healing: Secondary | ICD-10-CM | POA: Diagnosis not present

## 2022-09-10 DIAGNOSIS — R059 Cough, unspecified: Secondary | ICD-10-CM | POA: Diagnosis not present

## 2022-09-10 DIAGNOSIS — I2510 Atherosclerotic heart disease of native coronary artery without angina pectoris: Secondary | ICD-10-CM | POA: Diagnosis not present

## 2022-09-10 DIAGNOSIS — I639 Cerebral infarction, unspecified: Secondary | ICD-10-CM | POA: Diagnosis not present

## 2022-09-10 DIAGNOSIS — I739 Peripheral vascular disease, unspecified: Secondary | ICD-10-CM | POA: Diagnosis not present

## 2022-09-10 DIAGNOSIS — I517 Cardiomegaly: Secondary | ICD-10-CM | POA: Diagnosis not present

## 2022-09-11 DIAGNOSIS — I739 Peripheral vascular disease, unspecified: Secondary | ICD-10-CM | POA: Diagnosis not present

## 2022-09-11 DIAGNOSIS — L299 Pruritus, unspecified: Secondary | ICD-10-CM | POA: Diagnosis not present

## 2022-09-11 DIAGNOSIS — I2510 Atherosclerotic heart disease of native coronary artery without angina pectoris: Secondary | ICD-10-CM | POA: Diagnosis not present

## 2022-09-11 DIAGNOSIS — I639 Cerebral infarction, unspecified: Secondary | ICD-10-CM | POA: Diagnosis not present

## 2022-09-14 DIAGNOSIS — I5032 Chronic diastolic (congestive) heart failure: Secondary | ICD-10-CM | POA: Diagnosis not present

## 2022-09-14 DIAGNOSIS — M6281 Muscle weakness (generalized): Secondary | ICD-10-CM | POA: Diagnosis not present

## 2022-09-15 DIAGNOSIS — D649 Anemia, unspecified: Secondary | ICD-10-CM | POA: Diagnosis not present

## 2022-09-17 DIAGNOSIS — I5032 Chronic diastolic (congestive) heart failure: Secondary | ICD-10-CM | POA: Diagnosis not present

## 2022-09-17 DIAGNOSIS — I739 Peripheral vascular disease, unspecified: Secondary | ICD-10-CM | POA: Diagnosis not present

## 2022-09-17 DIAGNOSIS — M6281 Muscle weakness (generalized): Secondary | ICD-10-CM | POA: Diagnosis not present

## 2022-09-17 DIAGNOSIS — R001 Bradycardia, unspecified: Secondary | ICD-10-CM | POA: Diagnosis not present

## 2022-09-17 DIAGNOSIS — S82842D Displaced bimalleolar fracture of left lower leg, subsequent encounter for closed fracture with routine healing: Secondary | ICD-10-CM | POA: Diagnosis not present

## 2022-09-17 DIAGNOSIS — I639 Cerebral infarction, unspecified: Secondary | ICD-10-CM | POA: Diagnosis not present

## 2022-09-19 DIAGNOSIS — I5032 Chronic diastolic (congestive) heart failure: Secondary | ICD-10-CM | POA: Diagnosis not present

## 2022-09-19 DIAGNOSIS — M6281 Muscle weakness (generalized): Secondary | ICD-10-CM | POA: Diagnosis not present

## 2022-09-21 DIAGNOSIS — M6281 Muscle weakness (generalized): Secondary | ICD-10-CM | POA: Diagnosis not present

## 2022-09-21 DIAGNOSIS — I5032 Chronic diastolic (congestive) heart failure: Secondary | ICD-10-CM | POA: Diagnosis not present

## 2022-09-24 DIAGNOSIS — M6281 Muscle weakness (generalized): Secondary | ICD-10-CM | POA: Diagnosis not present

## 2022-09-24 DIAGNOSIS — I5032 Chronic diastolic (congestive) heart failure: Secondary | ICD-10-CM | POA: Diagnosis not present

## 2022-09-24 DIAGNOSIS — G5620 Lesion of ulnar nerve, unspecified upper limb: Secondary | ICD-10-CM | POA: Diagnosis not present

## 2022-09-26 DIAGNOSIS — I5032 Chronic diastolic (congestive) heart failure: Secondary | ICD-10-CM | POA: Diagnosis not present

## 2022-09-26 DIAGNOSIS — M6281 Muscle weakness (generalized): Secondary | ICD-10-CM | POA: Diagnosis not present

## 2022-09-28 DIAGNOSIS — I5032 Chronic diastolic (congestive) heart failure: Secondary | ICD-10-CM | POA: Diagnosis not present

## 2022-09-28 DIAGNOSIS — M6281 Muscle weakness (generalized): Secondary | ICD-10-CM | POA: Diagnosis not present

## 2022-10-01 DIAGNOSIS — M6281 Muscle weakness (generalized): Secondary | ICD-10-CM | POA: Diagnosis not present

## 2022-10-01 DIAGNOSIS — I5032 Chronic diastolic (congestive) heart failure: Secondary | ICD-10-CM | POA: Diagnosis not present

## 2022-10-03 DIAGNOSIS — I5032 Chronic diastolic (congestive) heart failure: Secondary | ICD-10-CM | POA: Diagnosis not present

## 2022-10-03 DIAGNOSIS — M6281 Muscle weakness (generalized): Secondary | ICD-10-CM | POA: Diagnosis not present

## 2022-10-05 DIAGNOSIS — I5032 Chronic diastolic (congestive) heart failure: Secondary | ICD-10-CM | POA: Diagnosis not present

## 2022-10-05 DIAGNOSIS — M6281 Muscle weakness (generalized): Secondary | ICD-10-CM | POA: Diagnosis not present

## 2022-10-17 DIAGNOSIS — M6281 Muscle weakness (generalized): Secondary | ICD-10-CM | POA: Diagnosis not present

## 2022-10-21 DIAGNOSIS — G5620 Lesion of ulnar nerve, unspecified upper limb: Secondary | ICD-10-CM | POA: Diagnosis not present

## 2022-10-21 DIAGNOSIS — M6281 Muscle weakness (generalized): Secondary | ICD-10-CM | POA: Diagnosis not present

## 2022-11-12 DIAGNOSIS — G5620 Lesion of ulnar nerve, unspecified upper limb: Secondary | ICD-10-CM | POA: Diagnosis not present

## 2022-11-12 DIAGNOSIS — M6281 Muscle weakness (generalized): Secondary | ICD-10-CM | POA: Diagnosis not present

## 2022-11-16 DIAGNOSIS — N179 Acute kidney failure, unspecified: Secondary | ICD-10-CM | POA: Diagnosis not present

## 2022-11-16 DIAGNOSIS — S82842D Displaced bimalleolar fracture of left lower leg, subsequent encounter for closed fracture with routine healing: Secondary | ICD-10-CM | POA: Diagnosis not present

## 2022-11-16 DIAGNOSIS — I5032 Chronic diastolic (congestive) heart failure: Secondary | ICD-10-CM | POA: Diagnosis not present

## 2022-11-16 DIAGNOSIS — I739 Peripheral vascular disease, unspecified: Secondary | ICD-10-CM | POA: Diagnosis not present

## 2022-11-27 DIAGNOSIS — E1140 Type 2 diabetes mellitus with diabetic neuropathy, unspecified: Secondary | ICD-10-CM | POA: Diagnosis not present

## 2022-11-28 DIAGNOSIS — E119 Type 2 diabetes mellitus without complications: Secondary | ICD-10-CM | POA: Diagnosis not present

## 2022-11-28 DIAGNOSIS — Z79899 Other long term (current) drug therapy: Secondary | ICD-10-CM | POA: Diagnosis not present

## 2022-11-29 DIAGNOSIS — R059 Cough, unspecified: Secondary | ICD-10-CM | POA: Diagnosis not present

## 2022-11-29 DIAGNOSIS — I517 Cardiomegaly: Secondary | ICD-10-CM | POA: Diagnosis not present

## 2022-11-30 DIAGNOSIS — R059 Cough, unspecified: Secondary | ICD-10-CM | POA: Diagnosis not present

## 2022-11-30 DIAGNOSIS — I10 Essential (primary) hypertension: Secondary | ICD-10-CM | POA: Diagnosis not present

## 2022-11-30 DIAGNOSIS — R112 Nausea with vomiting, unspecified: Secondary | ICD-10-CM | POA: Diagnosis not present

## 2023-01-02 ENCOUNTER — Encounter: Payer: Self-pay | Admitting: Podiatry

## 2023-01-02 ENCOUNTER — Ambulatory Visit (INDEPENDENT_AMBULATORY_CARE_PROVIDER_SITE_OTHER): Payer: Medicare Other | Admitting: Podiatry

## 2023-01-02 VITALS — BP 115/70

## 2023-01-02 DIAGNOSIS — M79675 Pain in left toe(s): Secondary | ICD-10-CM

## 2023-01-02 DIAGNOSIS — M79674 Pain in right toe(s): Secondary | ICD-10-CM

## 2023-01-02 DIAGNOSIS — E0842 Diabetes mellitus due to underlying condition with diabetic polyneuropathy: Secondary | ICD-10-CM

## 2023-01-02 DIAGNOSIS — B351 Tinea unguium: Secondary | ICD-10-CM

## 2023-01-02 NOTE — Progress Notes (Signed)
  Subjective:  Patient ID: Sierra Wright, female    DOB: 21-Aug-1934,  MRN: [id number]  Sierra Wright presents to clinic today for at risk foot care with history of diabetic neuropathy and painful thick toenails that are difficult to trim. Pain interferes with daily activities. Aggravating factors include wearing enclosed shoe gear. Pain is relieved with periodic professional debridement.  Chief Complaint  Patient presents with   Nail Problem     Routine foot care   New problem(s): None.   Sadly, patient states she buried her husband on yesterday.  PCP is [name], MD.  Allergies  Allergen Reactions   Duloxetine Hcl Nausea And Vomiting and Other (See Comments)   Statins Other (See Comments)    Causes myalgias Other reaction(s): Unknown   Amitriptyline Other (See Comments)    Pt. States that it caused it to be suicidal.    Benadryl [Diphenhydramine Hcl (Sleep)] Other (See Comments)    Makes the patient very "hyper"   Ezetimibe     Other reaction(s): Unknown    Gabapentin Other (See Comments)   Metformin Hcl     Other reaction(s): Unknown   Other     Other reaction(s): Unknown   Sitagliptin     Other reaction(s): Unknown    Tylenol [Acetaminophen] Nausea Only   Colesevelam Hcl Other (See Comments)    Causes dizziness Other reaction(s): Unknown    Review of Systems: Negative except as noted in the HPI.  Objective: No changes noted in today's physical examination. Vitals:   01/02/23 1058  BP: 115/70   LARUEN RISSER is a pleasant 89 y.o. female obese in NAD. AAO x 3.  Vascular Capillary fill time to digits <3 seconds b/l lower extremities. Faintly palpable DP pulse(s) b/l lower extremities. Faintly palpable PT pulse(s) b/l lower extremities. Pedal hair absent. Lower extremity skin temperature gradient within normal limits. No pain with calf compression b/l. No edema noted b/l lower extremities. No cyanosis or clubbing noted.  Neurologic Normal speech. Pt  has subjective symptoms of neuropathy. Protective sensation decreased with 10 gram monofilament b/l.  Dermatologic Pedal skin with normal turgor, texture and tone bilaterally. No open wounds bilaterally. No interdigital macerations bilaterally.   Toenails 1-5 b/l elongated, discolored, dystrophic, thickened, crumbly with subungual debris and tenderness to dorsal palpation. No pressure injuries b/l.  Orthopedic: Normal muscle strength 5/5 to all lower extremity muscle groups bilaterally. No pain crepitus or joint limitation noted with ROM b/l. Hallux valgus with bunion deformity noted b/l lower extremities. Patient presents in wheelchair.   Assessment/Plan: 1. Pain due to onychomycosis of toenails of both feet   2. Diabetic polyneuropathy associated with diabetes mellitus due to underlying condition [facility])   -Patient was evaluated and treated. All patient's and/or POA's questions/concerns answered on today's visit. -Continue soft, supportive shoe gear. -Facility to continue fall precautions and pressure precautions. -Mycotic toenails 1-5 bilaterally were debrided in length and girth with sterile nail nippers and dremel without incident. -Patient/POA to call should there be question/concern in the interim.   Return in about 3 months (around 04/02/2023).  Marzetta Board, DPM

## 2023-01-29 IMAGING — CT CT ANKLE*L* W/O CM
3 series · 13 of 33 positions shown, 16 images · non-contrast
Comparison: Radiograph 10/11/2021

CLINICAL DATA: Fracture, ankle

EXAM:
CT OF THE LEFT ANKLE WITHOUT CONTRAST
TECHNIQUE: Multidetector CT imaging of the left ankle was performed according
to the standard protocol. Multiplanar CT image reconstructions were
also generated.

[Series 4: lfov ext 3.0 b40s · axial · 0.40mm/px · z∈[+137,+275]mm · 5 of 68 slices shown, 7 images]
[im 11/68  soft-tissue]
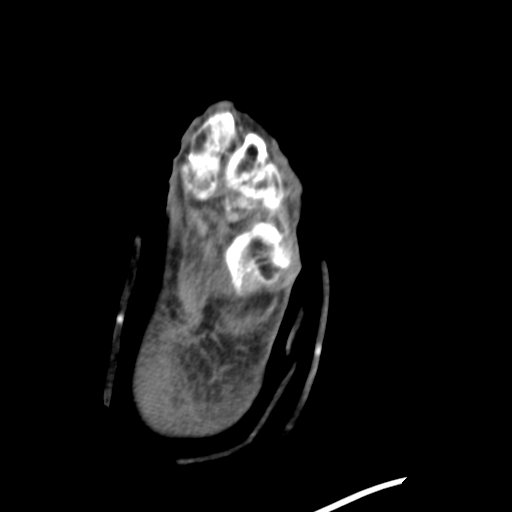
[im 11/68  bone]
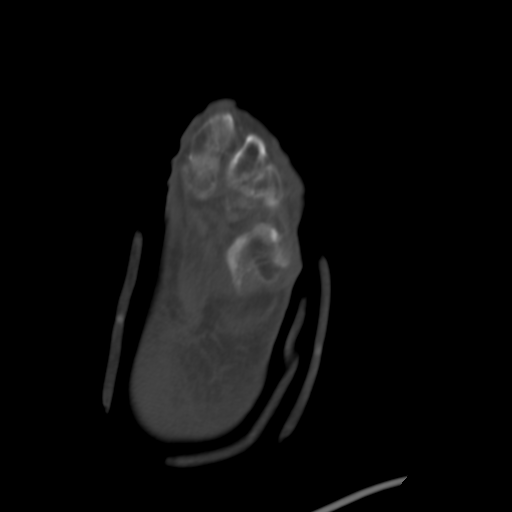
[im 21/68  bone]
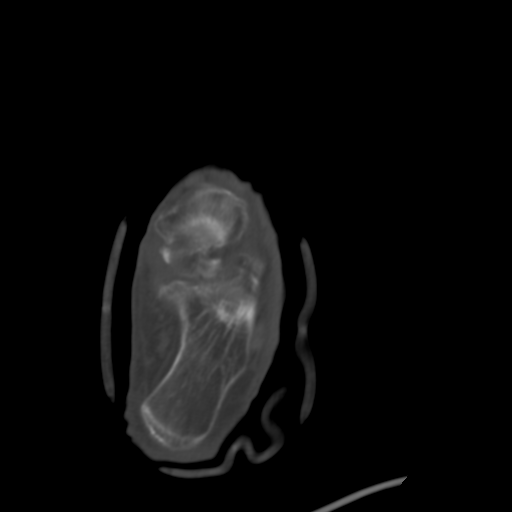
[im 37/68  bone]
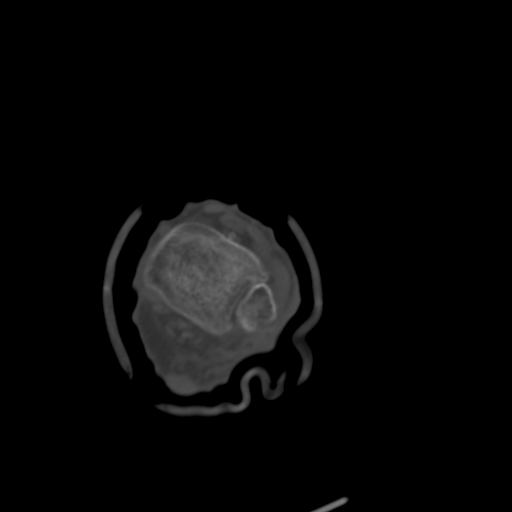
[im 47/68  bone]
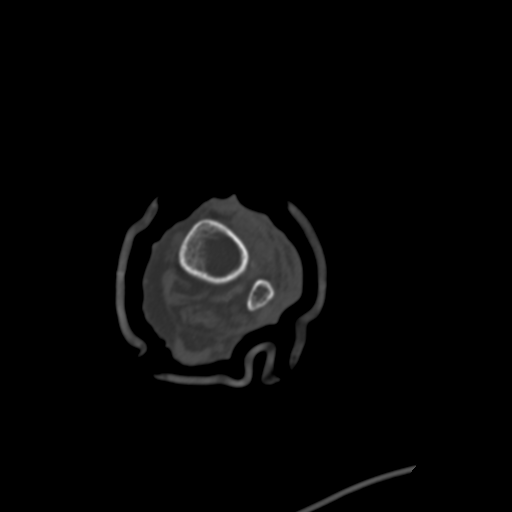
[im 57/68  soft-tissue]
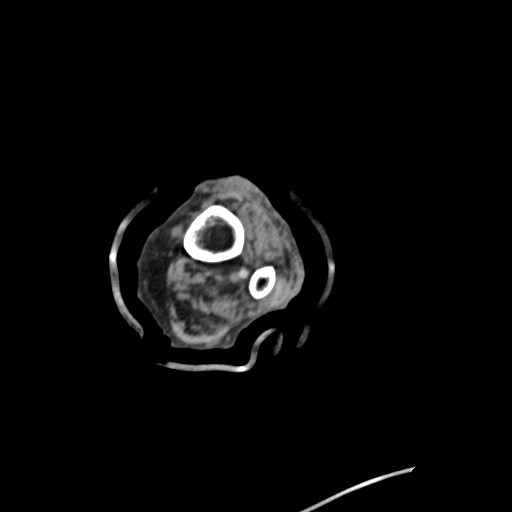
[im 57/68  bone]
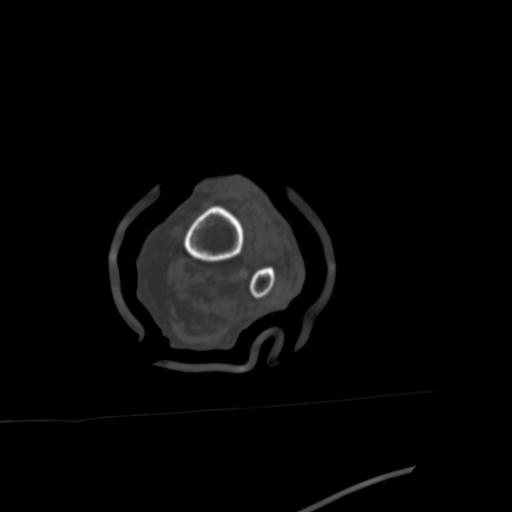

[Series 7: coronalsoft tissue · coronal · 0.25mm/px · 3 of 79 slices shown]
[im 16/79  bone]
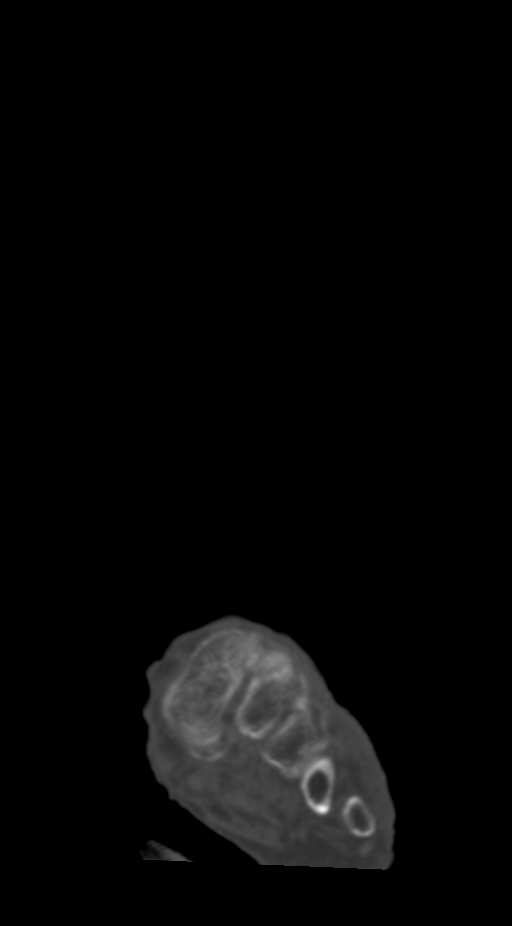
[im 32/79  bone]
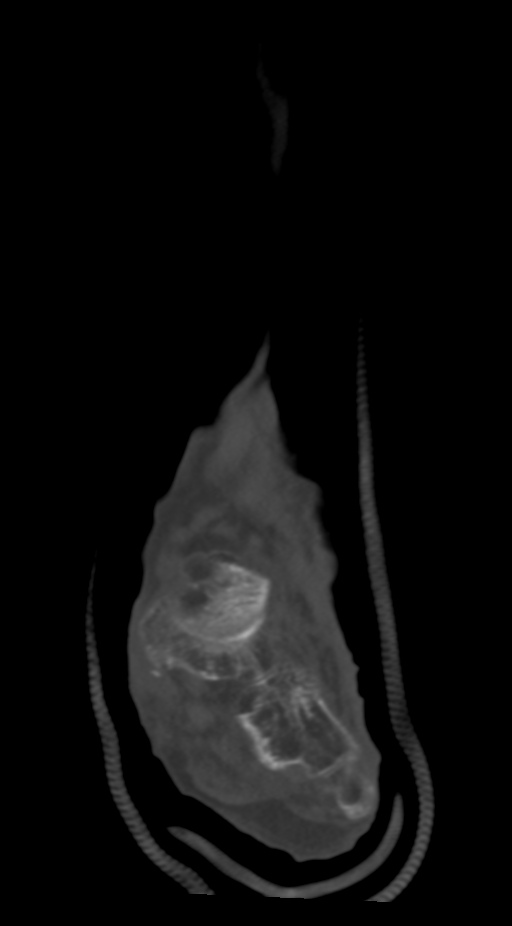
[im 47/79  bone]
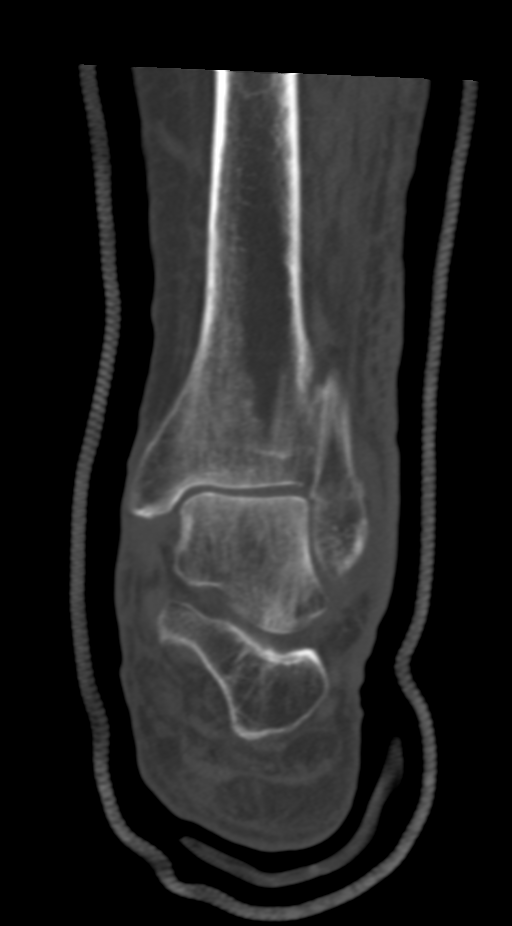

[Series 8: sagittalsoft tissue · sagittal · 0.36mm/px · 5 of 51 slices shown, 6 images]
[im 17/51  bone]
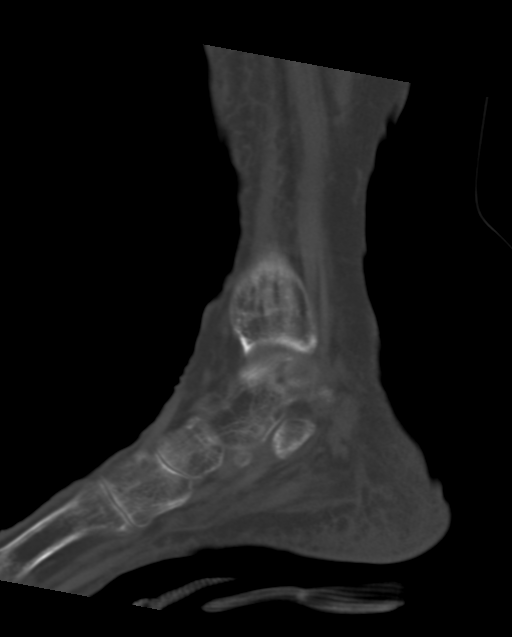
[im 21/51  bone]
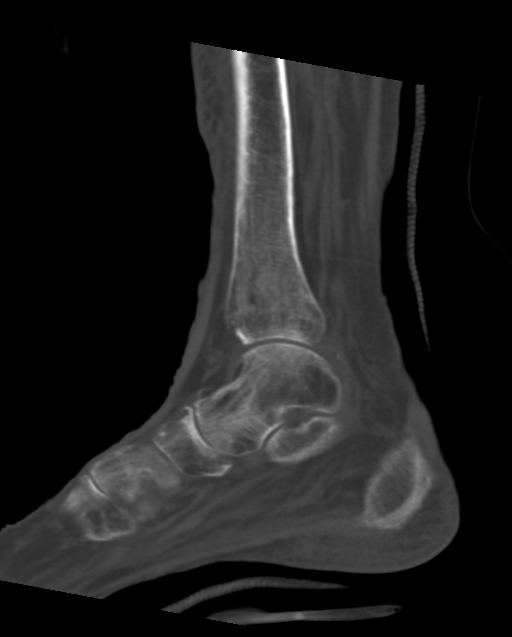
[im 26/51  soft-tissue]
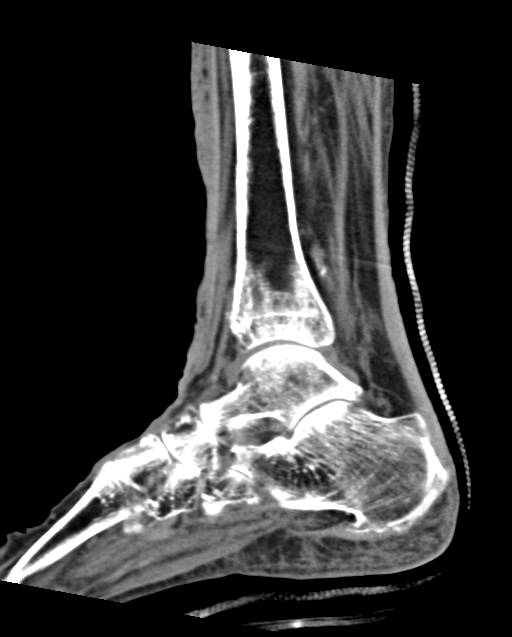
[im 26/51  bone]
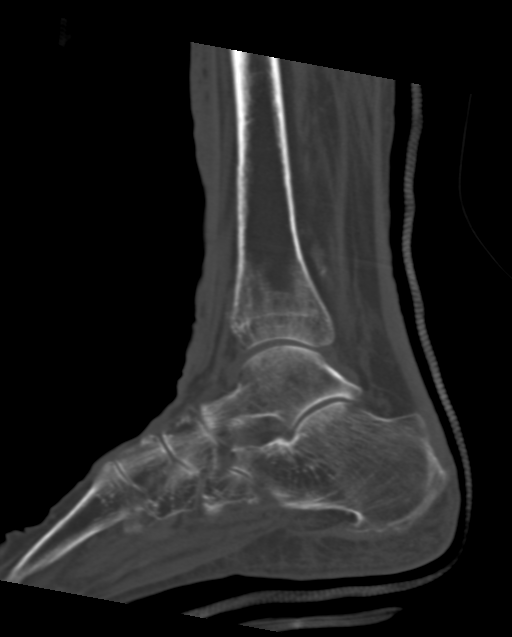
[im 30/51  bone]
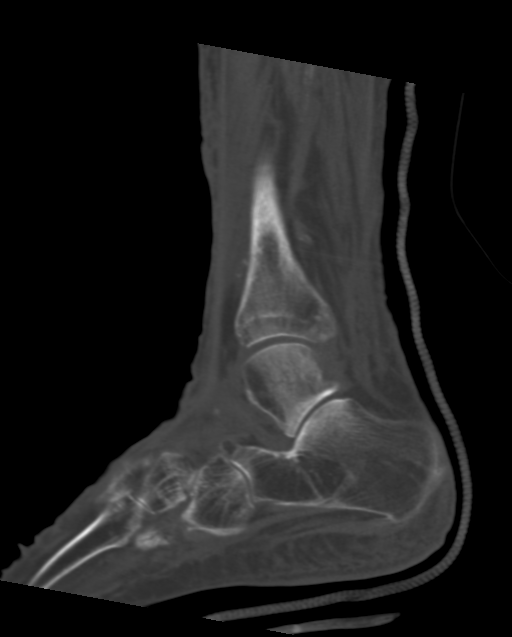
[im 34/51  bone]
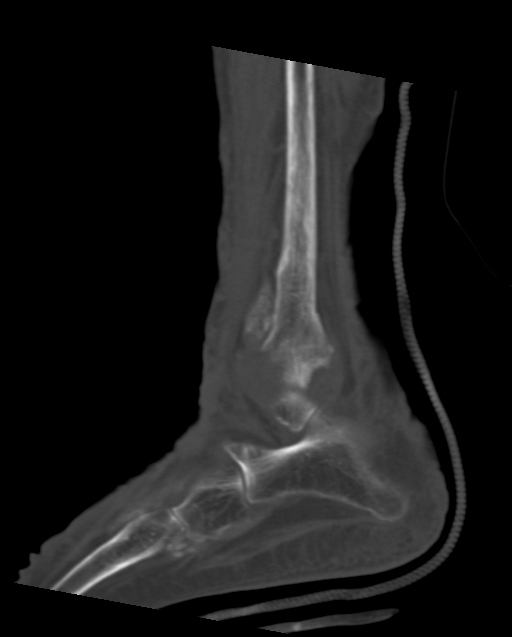

[13 of 33 positions shown; findings below may reference images not displayed]

FINDINGS: Bones/Joint/Cartilage

There is a minimally displaced distal fibular fracture, with 2 mm
posterior displacement. There is an anterolateral distal tibia
fracture consistent with an anterior inferior tibiofibular ligament
avulsion. Diffuse osteopenia. No other additional fracture. There is
no significant tibiotalar osteoarthritis. There is mild
talonavicular and calcaneocuboid osteoarthritis.

Ligaments

Suboptimally assessed by CT.

Muscles and Tendons

And no evidence of tendon entrapment.  Generalized muscle atrophy.

Soft tissues

Lateral ankle soft tissue swelling.
IMPRESSION: Minimally displaced distal fibula fracture.

Anterior inferior tibiofibular ligament avulsion fracture of the
distal tibia.

## 2023-03-06 ENCOUNTER — Ambulatory Visit (INDEPENDENT_AMBULATORY_CARE_PROVIDER_SITE_OTHER): Payer: Medicare Other | Admitting: Podiatry

## 2023-03-06 DIAGNOSIS — L03031 Cellulitis of right toe: Secondary | ICD-10-CM

## 2023-03-06 DIAGNOSIS — L600 Ingrowing nail: Secondary | ICD-10-CM

## 2023-03-06 NOTE — Progress Notes (Signed)
Subjective:   Patient ID: Sierra Wright, female   DOB: 89 y.o.   MRN: [id number]   HPI Patient presents with caregiver stating that her third nail right has come partially off and there was some irritation and she was concerned   ROS      Objective:  Physical Exam  Neurovascular status unchanged from previous visit lifting of the third nailbed with redness and drainage associated with it localized no proximal edema erythema or drainage noted     Assessment:  Traumatized third nail localized paronychia infection no indication systemic infection currently     Plan:  H&P done reviewed with patient and went ahead today and carefully remove the nail which was partially off.  I flushed out the area applied dressing instructed on soaks and I do think this will be uneventful and gave strict instructions if any redness or increased discomfort or other pathology were to occur to let us know immediately and come in

## 2023-03-06 NOTE — Patient Instructions (Signed)

## 2023-04-23 ENCOUNTER — Ambulatory Visit: Payer: Medicare Other | Attending: Physician Assistant | Admitting: Physician Assistant

## 2023-04-23 ENCOUNTER — Other Ambulatory Visit: Payer: Self-pay | Admitting: *Deleted

## 2023-04-23 ENCOUNTER — Encounter: Payer: Self-pay | Admitting: Physician Assistant

## 2023-04-23 VITALS — BP 110/50 | HR 80 | Ht 65.0 in | Wt 233.0 lb

## 2023-04-23 DIAGNOSIS — I440 Atrioventricular block, first degree: Secondary | ICD-10-CM | POA: Diagnosis not present

## 2023-04-23 DIAGNOSIS — I10 Essential (primary) hypertension: Secondary | ICD-10-CM | POA: Diagnosis not present

## 2023-04-23 DIAGNOSIS — I480 Paroxysmal atrial fibrillation: Secondary | ICD-10-CM | POA: Diagnosis not present

## 2023-04-23 DIAGNOSIS — E7800 Pure hypercholesterolemia, unspecified: Secondary | ICD-10-CM

## 2023-04-23 DIAGNOSIS — N189 Chronic kidney disease, unspecified: Secondary | ICD-10-CM

## 2023-04-23 DIAGNOSIS — N179 Acute kidney failure, unspecified: Secondary | ICD-10-CM

## 2023-04-23 DIAGNOSIS — I25709 Atherosclerosis of coronary artery bypass graft(s), unspecified, with unspecified angina pectoris: Secondary | ICD-10-CM

## 2023-04-23 DIAGNOSIS — I5032 Chronic diastolic (congestive) heart failure: Secondary | ICD-10-CM

## 2023-04-23 MED ORDER — FUROSEMIDE 40 MG PO TABS: ORAL_TABLET | ORAL | 3 refills | Status: AC

## 2023-04-23 NOTE — Assessment & Plan Note (Signed)
Blood pressure is controlled.  Continue amlodipine 5 mg daily, Imdur 30 mg daily, lisinopril 10 mg daily.

## 2023-04-23 NOTE — Progress Notes (Signed)
Cardiology Office Note:    Date:  04/23/2023  ID:  Sierra Wright, DOB 03-19-1934, MRN [id number] PCP: [name], MD  [facility] Providers Cardiologist:  Kristeen Miss, MD          Patient Profile:   Coronary artery disease  S/p CABG 1999 Myoview 11/17: no ischemia LHC 08/13/2018: S-RCA, L-LAD patent; S-OM 2 occluded; LCx prox 100 CTO, OM2 ost 99, LAD ost 99, prox 100 CTO, D1 ost 99, RCA ost 99, dist 100 CTO >> med Rx (HFpEF) heart failure with preserved ejection fraction  TTE 08/12/18: Mild LVH, EF 60-65, normal wall motion, mild MAC TTE 06/10/2022: EF  65-70, no RWMA, mild LVH, normal RVSF, mild LAE, trivial MR, moderate MAC, RAP 3 Paroxysmal atrial fibrillation  Dx in 10/2021 Bradycardia Asymptomatic - Mobitz I, 2:1 AV block >> Admx in 06/2022 >> AVN blocking agents DC'd - HR improved; eval by Card, EP (OP monitor recommended by EP - never done) Right Bundle Branch Block  S/p CVA in 07/2016 Hypertension  Hyperlipidemia  Intol of statins Diabetes mellitus  Carotid US 07/14/2016: Bilateral ICA 1-39 Hypothyroidism        History of Present Illness:   Sierra Wright is a 89 y.o. female returns for f/u of CAD, CHF, AFib. She was last seen by Dr. Elease Hashimoto in 07/2022.  She is here today with her daughter.  She stays in the assisted living facility [facility].  She is wheelchair-bound.  She has not had significant shortness of breath, chest pain, syncope, near syncope.  She sleeps on an incline chronically.  Review of Systems  Gastrointestinal:  Negative for hematochezia and melena.  Genitourinary:  Negative for hematuria.   see HPI    Studies Reviewed:    EKG: NSR, HR 80, long first-degree AV block, PR 360 ms, RBBB, QTc 509, similar to prior tracing in July 2023 except heart rate is faster on this tracing   Risk Assessment/Calculations:    CHA2DS2-VASc Score = 9   This indicates a 12.2% annual risk of stroke. The patient's score is based upon: CHF History: 1 HTN  History: 1 Diabetes History: 1 Stroke History: 2 Vascular Disease History: 1 Age Score: 2 Gender Score: 1            Physical Exam:   VS:  BP (!) 110/50   Pulse 80   Ht 5\' 5"  (1.651 m)   Wt 233 lb (105.7 kg)   SpO2 95%   BMI 38.77 kg/m    Wt Readings from Last 3 Encounters:  04/23/23 233 lb (105.7 kg)  07/11/22 243 lb (110.2 kg)  06/12/22 226 lb 10.1 oz (102.8 kg)    Constitutional:      Appearance: Healthy appearance. Not in distress.  Neck:     Vascular: No JVR.  Pulmonary:     Breath sounds: Normal breath sounds. No wheezing. No rales.  Cardiovascular:     Normal rate. Regular rhythm.     Murmurs:  Distant heart sounds-cannot appreciate a murmur  Edema:    Peripheral edema absent.  Abdominal:     Palpations: Abdomen is soft.       ASSESSMENT AND PLAN:   Chronic heart failure with preserved ejection fraction (HFpEF) (HCC) EF normal by echocardiogram in July 2023.  She is wheelchair-bound.  It is difficult to assess functional status.  Weights seem to have been stable.  Volume status appears stable.  Her furosemide was increased to 40 mg twice daily several months ago.  I was able to review labs from [place].  On 04/18/2023, her potassium was normal at 4.2, creatinine was elevated at 1.44.  Her creatinine was higher than previous documented in July 2023. Decrease furosemide to 40 mg in the morning and 20 mg in the evening Weigh daily.  Take extra furosemide 20 mg if weight increases more than 3 pounds in 1 day. BMET 1 week Follow-up 6 months.  Paroxysmal atrial fibrillation (HCC) Maintaining sinus rhythm.  Recent creatinine was 1.44.  If her creatinine continues to increase to 1.5 or higher, we will need to decrease her Eliquis to 2.5 mg twice daily given her age.  As noted, her furosemide dose will be adjusted.  Repeat BMET in a week.  For now, continue Eliquis 5 mg twice daily.  First degree AV block She was seen by EP during hospitalization in July 2023 for  bradycardia.  Her heart rate is much higher on electrocardiogram today.  She still has a long first-degree AV block as well as a right bundle branch block.  She has not had any symptoms of syncope, near syncope.  At this point, I do not think she needs to wear a heart monitor.  Continue to avoid AV nodal blocking agents.  Essential hypertension Blood pressure is controlled.  Continue amlodipine 5 mg daily, Imdur 30 mg daily, lisinopril 10 mg daily.  Coronary artery disease involving coronary bypass graft of native heart with angina pectoris (HCC) Status post CABG in 1999.  2 out of 3 grafts patent in 2019.  She is not having chest discomfort to suggest angina.  She does not require antiplatelet therapy as she is on Eliquis.  Continue amlodipine 5 mg daily, Imdur 30 mg daily.  Acute kidney injury superimposed on chronic kidney disease (HCC) Recent creatinine up to 1.44 based upon labs obtained from [facility] assisted living facility.  GFR is 35.  Adjust Lasix as noted.  Repeat BMET a week.  If creatinine increases to 1.5 or higher, we will need to decrease her dose of Eliquis.  Pure hypercholesterolemia She is intolerant of statins as well as ezetimibe.  We discussed the rationale for monitoring cholesterol further.  She is not really interested in taking a medication like PCSK9 inhibitor.  However, she would like to have her fasting lipids obtained with next blood draw.  If her LDL is significantly elevated, we can revisit whether or not to pursue alternative management for hyperlipidemia. Arrange fasting lipids with next blood draw.      Dispo:  Return in about 6 months (around 10/24/2023) for Routine Follow Up, w/ Dr. Elease Hashimoto.  Signed, Tereso Newcomer, PA-C

## 2023-04-23 NOTE — Patient Instructions (Signed)
Medication Instructions:  Your physician has recommended you make the following change in your medication:   REDUCE Lasix to 40 mg taking 1 in the a.m, and 1/2 tablet in the p.m  *If you need a refill on your cardiac medications before your next appointment, please call your pharmacy*   Lab Work: 1 WEEK:  BMET & FASTING LIPID  If you have labs (blood work) drawn today and your tests are completely normal, you will receive your results only by: MyChart Message (if you have MyChart) OR A paper copy in the mail If you have any lab test that is abnormal or we need to change your treatment, we will call you to review the results.   Testing/Procedures: None ordered   Follow-Up: At [facility], you and your health needs are our priority.  As part of our continuing mission to provide you with exceptional heart care, we have created designated Provider Care Teams.  These Care Teams include your primary Cardiologist (physician) and Advanced Practice Providers (APPs -  Physician Assistants and Nurse Practitioners) who all work together to provide you with the care you need, when you need it.  We recommend signing up for the patient portal called "MyChart".  Sign up information is provided on this After Visit Summary.  MyChart is used to connect with patients for Virtual Visits (Telemedicine).  Patients are able to view lab/test results, encounter notes, upcoming appointments, etc.  Non-urgent messages can be sent to your provider as well.   To learn more about what you can do with MyChart, go to [email or web address].    Your next appointment:   6 month(s)  Provider:   Kristeen Miss, MD     Other Instructions They are asked to weigh you daily

## 2023-04-23 NOTE — Assessment & Plan Note (Signed)
EF normal by echocardiogram in July 2023.  She is wheelchair-bound.  It is difficult to assess functional status.  Weights seem to have been stable.  Volume status appears stable.  Her furosemide was increased to 40 mg twice daily several months ago.  I was able to review labs from [place].  On 04/18/2023, her potassium was normal at 4.2, creatinine was elevated at 1.44.  Her creatinine was higher than previous documented in July 2023. Decrease furosemide to 40 mg in the morning and 20 mg in the evening Weigh daily.  Take extra furosemide 20 mg if weight increases more than 3 pounds in 1 day. BMET 1 week Follow-up 6 months.

## 2023-04-23 NOTE — Assessment & Plan Note (Signed)
She is intolerant of statins as well as ezetimibe.  We discussed the rationale for monitoring cholesterol further.  She is not really interested in taking a medication like PCSK9 inhibitor.  However, she would like to have her fasting lipids obtained with next blood draw.  If her LDL is significantly elevated, we can revisit whether or not to pursue alternative management for hyperlipidemia. Arrange fasting lipids with next blood draw.

## 2023-04-23 NOTE — Assessment & Plan Note (Signed)
Maintaining sinus rhythm.  Recent creatinine was 1.44.  If her creatinine continues to increase to 1.5 or higher, we will need to decrease her Eliquis to 2.5 mg twice daily given her age.  As noted, her furosemide dose will be adjusted.  Repeat BMET in a week.  For now, continue Eliquis 5 mg twice daily.

## 2023-04-23 NOTE — Assessment & Plan Note (Signed)
Status post CABG in 1999.  2 out of 3 grafts patent in 2019.  She is not having chest discomfort to suggest angina.  She does not require antiplatelet therapy as she is on Eliquis.  Continue amlodipine 5 mg daily, Imdur 30 mg daily.

## 2023-04-23 NOTE — Assessment & Plan Note (Signed)
She was seen by EP during hospitalization in July 2023 for bradycardia.  Her heart rate is much higher on electrocardiogram today.  She still has a long first-degree AV block as well as a right bundle branch block.  She has not had any symptoms of syncope, near syncope.  At this point, I do not think she needs to wear a heart monitor.  Continue to avoid AV nodal blocking agents.

## 2023-04-23 NOTE — Assessment & Plan Note (Signed)
Recent creatinine up to 1.44 based upon labs obtained from [facility] assisted living facility.  GFR is 35.  Adjust Lasix as noted.  Repeat BMET a week.  If creatinine increases to 1.5 or higher, we will need to decrease her dose of Eliquis.

## 2023-04-24 ENCOUNTER — Ambulatory Visit: Payer: Medicare Other | Admitting: Podiatry

## 2023-05-03 LAB — BASIC METABOLIC PANEL: EGFR: 47

## 2023-05-06 ENCOUNTER — Ambulatory Visit: Payer: Medicare Other | Admitting: Podiatry

## 2023-05-06 ENCOUNTER — Telehealth: Payer: Self-pay | Admitting: Podiatry

## 2023-05-06 NOTE — Telephone Encounter (Signed)
Pts daughter called stating she is at [facility] nursing facility and they told her that the appt was at 115pm but pts daughter had it at 1115am.  The appt was at 1115pm and the daughter is calling the facility to let them know and will call to r/s

## 2023-05-14 ENCOUNTER — Encounter: Payer: Self-pay | Admitting: Podiatry

## 2023-05-14 ENCOUNTER — Ambulatory Visit (INDEPENDENT_AMBULATORY_CARE_PROVIDER_SITE_OTHER): Payer: Medicare Other | Admitting: Podiatry

## 2023-05-14 DIAGNOSIS — B351 Tinea unguium: Secondary | ICD-10-CM

## 2023-05-14 DIAGNOSIS — M79674 Pain in right toe(s): Secondary | ICD-10-CM | POA: Diagnosis not present

## 2023-05-14 DIAGNOSIS — L03031 Cellulitis of right toe: Secondary | ICD-10-CM

## 2023-05-14 DIAGNOSIS — E0842 Diabetes mellitus due to underlying condition with diabetic polyneuropathy: Secondary | ICD-10-CM

## 2023-05-14 DIAGNOSIS — M79675 Pain in left toe(s): Secondary | ICD-10-CM

## 2023-05-14 NOTE — Progress Notes (Addendum)
This patient returns to my office for at risk foot care.  This patient requires this care by a professional since this patient will be at risk due to having PVD and diabetes.  This patient is unable to cut nails herself since the patient cannot reach her nails.These nails are painful walking and wearing shoes.  This patient presents for at risk foot care today.  She presents to the office in a wheelchair.  General Appearance  Alert, conversant and in no acute stress.  Vascular  Dorsalis pedis and posterior tibial  pulses are  weakly palpable  bilaterally.  Capillary return is within normal limits  bilaterally. Temperature is within normal limits  bilaterally.  Neurologic  Senn-Weinstein monofilament wire test within normal limits  bilaterally. Muscle power within normal limits bilaterally.  Nails Thick disfigured discolored nails with subungual debris  from hallux to fifth toes bilaterally. No evidence of bacterial infection or drainage bilaterally. Fourth toe right foot has become infected and is growing into the medial border groove.  Orthopedic  No limitations of motion  feet .  No crepitus or effusions noted.  No bony pathology or digital deformities noted.  Skin  normotropic skin with no porokeratosis noted bilaterally.  No signs of infections or ulcers noted.     Onychomycosis  Pain in right toes  Pain in left toes  Paronychia fourth toenail right foot.  Consent was obtained for treatment procedures.   Mechanical debridement of nails 1-5  bilaterally performed with a nail nipper.  Filed with dremel without incident. Upon providing nail care she had a nail plate growing in the medial nail groove fourth toe right foot.  She had previous history of paronychia third toe right foot which was previously treated.  Removal of nail plate fourth toe right foot/DSD. Call the office if problem fourth toe persists.   Return office visit  3 months                    Told patient to return for periodic  foot care and evaluation due to potential at risk complications.   Helane Gunther DPM

## 2023-05-17 ENCOUNTER — Telehealth: Payer: Self-pay | Admitting: Physician Assistant

## 2023-05-17 NOTE — Telephone Encounter (Signed)
Labs from 05/01/23 reviewed.  Creatinine, K+ normal. LDL above goal. PLAN:  -If pt is willing refer to [facility] to consider PCSK9 inhib or Bempedoic acid. Tereso Newcomer, PA-C    05/17/2023 1:36 PM

## 2023-05-17 NOTE — Telephone Encounter (Signed)
Call placed to pt regarding lab results below.  Left a message for pt to call back.  

## 2023-05-22 NOTE — Telephone Encounter (Signed)
2nd attempt to reach pt regarding results below, left another message for pt to call back.

## 2023-06-03 ENCOUNTER — Encounter: Payer: Self-pay | Admitting: *Deleted

## 2023-06-03 NOTE — Telephone Encounter (Signed)
3rd attempt to reach pt regarding message below.  No answer / no machine.  Will mail letter for pt to call the office.

## 2023-06-24 NOTE — Telephone Encounter (Signed)
Pt and daughter, Charlton Amor, verbal permission to speak to her, returned my call regarding lab results.   Pt declines to see the PharmD for cholesterol management.   Will fwd to [facility], PA-C, to make him aware.

## 2023-06-25 NOTE — Telephone Encounter (Signed)
[address] [place], [place]    06/25/2023 9:27 AM

## 2023-08-04 DEATH — deceased

## 2023-08-27 ENCOUNTER — Ambulatory Visit: Payer: Medicare Other | Admitting: Podiatry

## 2023-10-21 ENCOUNTER — Ambulatory Visit: Payer: Medicare Other | Admitting: Cardiovascular Disease
